# Patient Record
Sex: Male | Born: 1940 | Hispanic: No | Marital: Married | Smoking: Former smoker
Health system: Southern US, Community
[De-identification: ages and names within clinical notes are randomized; demographics above are authoritative.]

## PROBLEM LIST (undated history)

## (undated) DIAGNOSIS — I493 Ventricular premature depolarization: Secondary | ICD-10-CM

## (undated) DIAGNOSIS — L570 Actinic keratosis: Secondary | ICD-10-CM

## (undated) DIAGNOSIS — J342 Deviated nasal septum: Secondary | ICD-10-CM

## (undated) DIAGNOSIS — I4430 Unspecified atrioventricular block: Secondary | ICD-10-CM

## (undated) DIAGNOSIS — I639 Cerebral infarction, unspecified: Secondary | ICD-10-CM

## (undated) DIAGNOSIS — J343 Hypertrophy of nasal turbinates: Secondary | ICD-10-CM

## (undated) DIAGNOSIS — K279 Peptic ulcer, site unspecified, unspecified as acute or chronic, without hemorrhage or perforation: Secondary | ICD-10-CM

## (undated) DIAGNOSIS — I491 Atrial premature depolarization: Secondary | ICD-10-CM

## (undated) DIAGNOSIS — R011 Cardiac murmur, unspecified: Secondary | ICD-10-CM

## (undated) DIAGNOSIS — J301 Allergic rhinitis due to pollen: Secondary | ICD-10-CM

## (undated) DIAGNOSIS — I10 Essential (primary) hypertension: Secondary | ICD-10-CM

## (undated) DIAGNOSIS — G629 Polyneuropathy, unspecified: Secondary | ICD-10-CM

## (undated) DIAGNOSIS — M51379 Other intervertebral disc degeneration, lumbosacral region without mention of lumbar back pain or lower extremity pain: Secondary | ICD-10-CM

## (undated) DIAGNOSIS — Z5189 Encounter for other specified aftercare: Secondary | ICD-10-CM

## (undated) DIAGNOSIS — R51 Headache: Secondary | ICD-10-CM

## (undated) DIAGNOSIS — I998 Other disorder of circulatory system: Secondary | ICD-10-CM

## (undated) DIAGNOSIS — M19039 Primary osteoarthritis, unspecified wrist: Secondary | ICD-10-CM

## (undated) DIAGNOSIS — M1A9XX1 Chronic gout, unspecified, with tophus (tophi): Secondary | ICD-10-CM

## (undated) DIAGNOSIS — I428 Other cardiomyopathies: Secondary | ICD-10-CM

## (undated) DIAGNOSIS — M5137 Other intervertebral disc degeneration, lumbosacral region: Secondary | ICD-10-CM

## (undated) DIAGNOSIS — E119 Type 2 diabetes mellitus without complications: Secondary | ICD-10-CM

## (undated) DIAGNOSIS — R519 Headache, unspecified: Secondary | ICD-10-CM

## (undated) DIAGNOSIS — R799 Abnormal finding of blood chemistry, unspecified: Secondary | ICD-10-CM

## (undated) DIAGNOSIS — H9190 Unspecified hearing loss, unspecified ear: Secondary | ICD-10-CM

## (undated) DIAGNOSIS — R001 Bradycardia, unspecified: Secondary | ICD-10-CM

## (undated) DIAGNOSIS — G4700 Insomnia, unspecified: Secondary | ICD-10-CM

## (undated) DIAGNOSIS — E785 Hyperlipidemia, unspecified: Secondary | ICD-10-CM

## (undated) DIAGNOSIS — F411 Generalized anxiety disorder: Secondary | ICD-10-CM

## (undated) DIAGNOSIS — M1990 Unspecified osteoarthritis, unspecified site: Secondary | ICD-10-CM

## (undated) DIAGNOSIS — K219 Gastro-esophageal reflux disease without esophagitis: Secondary | ICD-10-CM

## (undated) HISTORY — PX: CARDIAC CATHETERIZATION: SHX172

## (undated) HISTORY — DX: Atrial premature depolarization: I49.1

## (undated) HISTORY — DX: Gastro-esophageal reflux disease without esophagitis: K21.9

## (undated) HISTORY — DX: Cardiac murmur, unspecified: R01.1

## (undated) HISTORY — DX: Chronic gout, unspecified, with tophus (tophi): M1A.9XX1

## (undated) HISTORY — DX: Unspecified hearing loss, unspecified ear: H91.90

## (undated) HISTORY — DX: Essential (primary) hypertension: I10

## (undated) HISTORY — DX: Allergic rhinitis due to pollen: J30.1

## (undated) HISTORY — DX: Actinic keratosis: L57.0

## (undated) HISTORY — DX: Generalized anxiety disorder: F41.1

## (undated) HISTORY — DX: Polyneuropathy, unspecified: G62.9

## (undated) HISTORY — DX: Type 2 diabetes mellitus without complications: E11.9

## (undated) HISTORY — DX: Primary osteoarthritis, unspecified wrist: M19.039

## (undated) HISTORY — DX: Deviated nasal septum: J34.2

## (undated) HISTORY — DX: Unspecified atrioventricular block: I44.30

## (undated) HISTORY — DX: Other intervertebral disc degeneration, lumbosacral region without mention of lumbar back pain or lower extremity pain: M51.379

## (undated) HISTORY — DX: Bradycardia, unspecified: R00.1

## (undated) HISTORY — PX: WRIST SURGERY: SHX841

## (undated) HISTORY — PX: ELBOW SURGERY: SHX618

## (undated) HISTORY — DX: Hypertrophy of nasal turbinates: J34.3

## (undated) HISTORY — DX: Encounter for other specified aftercare: Z51.89

## (undated) HISTORY — DX: Headache: R51

## (undated) HISTORY — PX: COLONOSCOPY: SHX174

## (undated) HISTORY — DX: Ventricular premature depolarization: I49.3

## (undated) HISTORY — DX: Headache, unspecified: R51.9

## (undated) HISTORY — DX: Unspecified osteoarthritis, unspecified site: M19.90

## (undated) HISTORY — DX: Peptic ulcer, site unspecified, unspecified as acute or chronic, without hemorrhage or perforation: K27.9

## (undated) HISTORY — DX: Hyperlipidemia, unspecified: E78.5

## (undated) HISTORY — DX: Insomnia, unspecified: G47.00

## (undated) HISTORY — PX: JOINT REPLACEMENT: SHX530

## (undated) HISTORY — DX: Abnormal finding of blood chemistry, unspecified: R79.9

## (undated) HISTORY — DX: Other disorder of circulatory system: I99.8

## (undated) HISTORY — DX: Other intervertebral disc degeneration, lumbosacral region: M51.37

---

## 1998-03-30 ENCOUNTER — Inpatient Hospital Stay (HOSPITAL_COMMUNITY): Admission: EM | Admit: 1998-03-30 | Discharge: 1998-04-05 | Payer: Self-pay | Admitting: Emergency Medicine

## 2013-12-09 ENCOUNTER — Encounter: Payer: Self-pay | Admitting: Gastroenterology

## 2014-01-13 ENCOUNTER — Ambulatory Visit (AMBULATORY_SURGERY_CENTER): Payer: Self-pay

## 2014-01-13 VITALS — Ht 72.0 in | Wt 215.0 lb

## 2014-01-13 DIAGNOSIS — Z83719 Family history of colon polyps, unspecified: Secondary | ICD-10-CM

## 2014-01-13 DIAGNOSIS — Z8371 Family history of colonic polyps: Secondary | ICD-10-CM

## 2014-01-13 MED ORDER — MOVIPREP 100 G PO SOLR: 1.0000 | Freq: Once | ORAL | Status: DC

## 2014-01-27 ENCOUNTER — Other Ambulatory Visit: Payer: Self-pay | Admitting: Gastroenterology

## 2014-01-27 ENCOUNTER — Ambulatory Visit (AMBULATORY_SURGERY_CENTER): Payer: Medicare Other | Admitting: Gastroenterology

## 2014-01-27 ENCOUNTER — Encounter: Payer: Self-pay | Admitting: Gastroenterology

## 2014-01-27 ENCOUNTER — Telehealth: Payer: Self-pay | Admitting: Gastroenterology

## 2014-01-27 VITALS — BP 145/71 | HR 56 | Temp 97.5°F | Resp 18 | Ht 72.0 in | Wt 215.0 lb

## 2014-01-27 DIAGNOSIS — Z8371 Family history of colonic polyps: Secondary | ICD-10-CM

## 2014-01-27 DIAGNOSIS — D126 Benign neoplasm of colon, unspecified: Secondary | ICD-10-CM

## 2014-01-27 MED ORDER — SODIUM CHLORIDE 0.9 % IV SOLN: 500.0000 mL | INTRAVENOUS | Status: DC

## 2014-01-27 NOTE — Progress Notes (Signed)
Report to pacu rn, vss, bbs=clear 

## 2014-01-27 NOTE — Telephone Encounter (Signed)
On call note @ 2150. Pt had colonoscopy today by [facility] with multiple polyps removed. Procedure report reviewed. He noted small amounts BRB every 2 hours since returning home about 2 pm, total of 4 episodes. No pain, weakness, dizziness, fever. He feels well other than noting bleeding. Offered ED evaluation now or monitoring further at home. His wife is with him and he is comfortable watching to determine if the bleeding continues before going to ED. Advised bedrest, clear liquid diet for 24 hours. No ASA or NSAIDs for 3 weeks. Call back or go immediately to [facility] or [facility] ED if bleeding persists or worsens or new symptoms develop. He seems reliable and is comfortable with this plan.

## 2014-01-27 NOTE — Op Note (Signed)
[place]  [address] [place], [id number]   COLONOSCOPY PROCEDURE REPORT  PATIENT: Nicholas Shelton, Nicholas Shelton  MR#: [id number] BIRTHDATE: 1941-07-20 , 72  yrs. old GENDER: Male ENDOSCOPIST: Inda Castle, MD REFERRED VP:XTGG Redmond Pulling, MD PROCEDURE DATE:  01/27/2014 PROCEDURE:   Colonoscopy with snare polypectomy, Colonoscopy with hot biopsy/bipolar, and Submucosal injection, any substance First Screening Colonoscopy - Avg.  risk and is 50 yrs.  old or older - No.  Prior Negative Screening - Now for repeat screening. N/A  History of Adenoma - Now for follow-up colonoscopy & has been > or = to 3 yrs.  Yes hx of adenoma.  Has been 3 or more years since last colonoscopy.  Polyps Removed Today? Yes. ASA CLASS:   Class II INDICATIONS:Patient's personal history of colon polyps.   last colonoscopy greater than 12 years ago MEDICATIONS: propofol (Diprivan) 500mg  IV  DESCRIPTION OF PROCEDURE:   After the risks benefits and alternatives of the procedure were thoroughly explained, informed consent was obtained.  A digital rectal exam revealed no abnormalities of the rectum.   The LB [id number]  endoscope was introduced through the anus and advanced to the cecum, which was identified by both the appendix and ileocecal valve. No adverse events experienced.   The quality of the prep was Suprep good  The instrument was then slowly withdrawn as the colon was fully examined.      COLON FINDINGS: There were 4 sessile polyps in the cecum.  The 2 largest measured 15-20 mm.  One was injected submucosally with 7 cc normal saline.  Dissection was injected with 4 cc normal saline. Each was removed with hot polypectomy snare and submitted to pathology.  A 1 cm and 67mm cecal polyp was each removed with cold polypectomy snare and submitted to pathology.  The remnant of a single polyp in the cecum was removed with the high biopsy forceps.There were 2 sessile polyps measuring approximately  1 cm in the ascending colon which were removed with cold polypectomy snare and submitted to pathology.  Or 2 polyps in the hepatic flexure and proximal transverse colon, respectively, measuring 8 and 15 mm. Each was removed with cold polypectomy snare and submitted to pathology.  In the proximal descending colon 50 mm polyp was removed with cold polypectomy snare and submitted to pathology. There were 2 sessile polyps in the proximal and distal sigmoid, respectively, each measured about 15 mm.  The distal polyp was friable and removed with hot polypectomy snare and submitted to pathology.  The more proximal polyp was removed with cold polypectomy snare and submitted to pathology.   There were 4 sessile polyps in the cecum.  The 2 largest measured 15-20 mm.  One was injected submucosally with 7 cc normal saline.  Dissection was injected with 4 cc normal saline.  Each was removed with hot polypectomy snare and submitted to pathology.  A 1 cm and 92mm cecal polyp was each removed with cold polypectomy snare and submitted to pathology.  The remnant of a single polyp in the cecum was removed with the high biopsy forceps.There were 2 sessile polyps measuring approximately 1 cm in the ascending colon which were removed with cold polypectomy snare and submitted to pathology.  Or 2 polyps in the hepatic flexure and proximal transverse colon, respectively, measuring 8 and 15 mm.  Each was removed with cold polypectomy snare and submitted to pathology.  In the proximal descending colon 50 mm polyp was removed with cold polypectomy snare  and submitted to pathology.  There were 2 sessile polyps in the proximal and distal sigmoid, respectively, each measured about 15 mm.  The distal polyp was friable and removed with hot polypectomy snare and submitted to pathology.  The more proximal polyp was removed with cold polypectomy snare and submitted to pathology.  Retroflexed views revealed no  abnormalities. The time to cecum=4 minutes 0 seconds.  Withdrawal time=36 minutes 42 seconds.  The scope was withdrawn and the procedure completed. COMPLICATIONS: There were no complications.  ENDOSCOPIC IMPRESSION: 1.   colonic polyposis  RECOMMENDATIONS: colonoscopy in one year  eSigned:  Inda Castle, MD 01/27/2014 12:14 PM   cc:   PATIENT NAME:  Nicholas Shelton, Nicholas Shelton MR#: [id number]

## 2014-01-27 NOTE — Progress Notes (Signed)
Called to room to assist during endoscopic procedure.  Patient ID and intended procedure confirmed with present staff. Received instructions for my participation in the procedure from the performing physician.  

## 2014-01-27 NOTE — Patient Instructions (Addendum)

## 2014-01-28 ENCOUNTER — Telehealth: Payer: Self-pay | Admitting: Gastroenterology

## 2014-01-28 NOTE — Telephone Encounter (Signed)
On call note at 1300. I called pt to check on him today. He reports only 1 BM since he called last night. It had scant, light pink liquid with his BM this am. He feels well. No pain, fevers, dizziness. Bleeding appears to be resolving. Advised to advance to a light diet and rest at home this weekend. No ASA/NSAIDs for 3 weeks. Call or present to ED if bleeding recurs.

## 2014-01-30 ENCOUNTER — Telehealth: Payer: Self-pay | Admitting: *Deleted

## 2014-01-30 NOTE — Telephone Encounter (Signed)
  Follow up Call-  Call back number 01/27/2014  Post procedure Call Back phone  # [phone]  Permission to leave phone message Yes     Patient questions:  Do you have a fever, pain , or abdominal swelling? no Pain Score  0 *  Have you tolerated food without any problems? yes  Have you been able to return to your normal activities? yes  Do you have any questions about your discharge instructions: Diet   no Medications  no Follow up visit  no  Do you have questions or concerns about your Care? Yes   Pt. States he had quite a bit of bleeding Friday evening after colonoscopy.  He called and was told to drink lots of fluids and monitor bleeding.  States he's had no bleeding since Early Saturday AM.  Advised to call office if bleeding was heavy and reoccurred, continue liquids.  Actions: * If pain score is 4 or above: No action needed, pain <4.

## 2014-02-06 ENCOUNTER — Encounter: Payer: Self-pay | Admitting: Gastroenterology

## 2014-12-25 ENCOUNTER — Encounter: Payer: Self-pay | Admitting: Gastroenterology

## 2014-12-28 ENCOUNTER — Encounter: Payer: Self-pay | Admitting: Gastroenterology

## 2015-02-15 ENCOUNTER — Ambulatory Visit (AMBULATORY_SURGERY_CENTER): Payer: Self-pay | Admitting: *Deleted

## 2015-02-15 VITALS — Ht 71.0 in | Wt 205.0 lb

## 2015-02-15 DIAGNOSIS — Z86010 Personal history of colonic polyps: Secondary | ICD-10-CM

## 2015-02-15 MED ORDER — NA SULFATE-K SULFATE-MG SULF 17.5-3.13-1.6 GM/177ML PO SOLN: ORAL | Status: DC

## 2015-02-15 NOTE — Progress Notes (Signed)
No egg or soy allergy  No anesthesia or intubation problems per pt  No diet medications taken  Registered in EMMI   

## 2015-02-19 ENCOUNTER — Encounter: Payer: Self-pay | Admitting: Gastroenterology

## 2015-03-01 ENCOUNTER — Ambulatory Visit (AMBULATORY_SURGERY_CENTER): Payer: Medicare Other | Admitting: Gastroenterology

## 2015-03-01 ENCOUNTER — Encounter: Payer: Self-pay | Admitting: Gastroenterology

## 2015-03-01 VITALS — BP 157/95 | HR 52 | Temp 97.1°F | Resp 19 | Ht 71.0 in | Wt 205.0 lb

## 2015-03-01 DIAGNOSIS — D120 Benign neoplasm of cecum: Secondary | ICD-10-CM | POA: Diagnosis not present

## 2015-03-01 DIAGNOSIS — Z86010 Personal history of colonic polyps: Secondary | ICD-10-CM | POA: Diagnosis present

## 2015-03-01 DIAGNOSIS — D124 Benign neoplasm of descending colon: Secondary | ICD-10-CM | POA: Diagnosis not present

## 2015-03-01 DIAGNOSIS — D123 Benign neoplasm of transverse colon: Secondary | ICD-10-CM | POA: Diagnosis not present

## 2015-03-01 DIAGNOSIS — D122 Benign neoplasm of ascending colon: Secondary | ICD-10-CM

## 2015-03-01 MED ORDER — SODIUM CHLORIDE 0.9 % IV SOLN: 500.0000 mL | INTRAVENOUS | Status: DC

## 2015-03-01 NOTE — Progress Notes (Signed)
A/ox3 pleased with MAC, report to Tracy RN 

## 2015-03-01 NOTE — Op Note (Signed)
[place]  [address]. [place], [id number]   COLONOSCOPY PROCEDURE REPORT  PATIENT: Nicholas Shelton, Nicholas Shelton  MR#: [id number] BIRTHDATE: [birth year] , 89  yrs. old GENDER: male ENDOSCOPIST: Inda Castle, MD REFERRED BY: PROCEDURE DATE:  03/01/2015 PROCEDURE:   Colonoscopy, surveillance , Colonoscopy with snare polypectomy, and Colonoscopy with cold biopsy polypectomy First Screening Colonoscopy - Avg.  risk and is 50 yrs.  old or older - No.  Prior Negative Screening - Now for repeat screening. N/A  History of Adenoma - Now for follow-up colonoscopy & has been > or = to 3 yrs.  No.  It has been less than 3 yrs since last colonoscopy.  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 MEDICATIONS: Monitored anesthesia care and Propofol 250 mg IV  DESCRIPTION OF PROCEDURE:   After the risks benefits and alternatives of the procedure were thoroughly explained, informed consent was obtained.  The digital rectal exam revealed no abnormalities of the rectum.   The LB [id number]  endoscope was introduced through the anus and advanced to the cecum, which was identified by both the appendix and ileocecal valve. No adverse events experienced.   The quality of the prep was (Suprep was used) good.  The instrument was then slowly withdrawn as the colon was fully examined.      COLON FINDINGS: A sessile polyp measuring 8 mm in size was found at the cecum.  A polypectomy was performed with a cold snare.  The resection was complete, the polyp tissue was completely retrieved and sent to histology.   A sessile polyp measuring 15 mm in size was found in the ascending colon.  A polypectomy was performed with a cold snare.  The resection was complete, the polyp tissue was completely retrieved and sent to histology.   A sessile polyp measuring 4 mm in size was found at the hepatic flexure.  A polypectomy was performed with a cold  snare.  The resection was complete, the polyp tissue was completely retrieved and sent to histology.   A semi-pedunculated polyp measuring 9 mm in size was found in the descending colon.  A polypectomy was performed with a cold snare.  The resection was complete, the polyp tissue was completely retrieved and sent to histology.  Retroflexed views revealed no abnormalities. The time to cecum = 6.8 Withdrawal time = 9.8   The scope was withdrawn and the procedure completed. COMPLICATIONS: There were no immediate complications.  ENDOSCOPIC IMPRESSION: 1.   Sessile polyp was found at the cecum; polypectomy was performed with a cold snare 2.   Sessile polyp was found in the ascending colon; polypectomy was performed with a cold snare 3.   Sessile polyp was found at the hepatic flexure; polypectomy was performed with a cold snare 4.   Semi-pedunculated polyp was found in the descending colon; polypectomy was performed with a cold snare 5.   Sessile polyp was found in the ascending colon; polypectomy was performed with cold forceps  RECOMMENDATIONS: Colonoscopy 3 years  eSigned:  Inda Castle, MD 03/01/2015 10:15 AM   cc:   PATIENT NAME:  Nicholas Shelton, Nicholas Shelton MR#: [id number]

## 2015-03-01 NOTE — Patient Instructions (Signed)
Impressions/recommendations:  Polyps (handout given)  Repeat colonoscopy in 3 years.  No aspirin, aspirin containing products or NSAIDS for 14 days. May resume May 6 if needed. Tylenol only until then if needed.  YOU HAD AN ENDOSCOPIC PROCEDURE TODAY AT [facility]:   Refer to the procedure report that was given to you for any specific questions about what was found during the examination.  If the procedure report does not answer your questions, please call your gastroenterologist to clarify.  If you requested that your care partner not be given the details of your procedure findings, then the procedure report has been included in a sealed envelope for you to review at your convenience later.  YOU SHOULD EXPECT: Some feelings of bloating in the abdomen. Passage of more gas than usual.  Walking can help get rid of the air that was put into your GI tract during the procedure and reduce the bloating. If you had a lower endoscopy (such as a colonoscopy or flexible sigmoidoscopy) you may notice spotting of blood in your stool or on the toilet paper. If you underwent a bowel prep for your procedure, you may not have a normal bowel movement for a few days.  Please Note:  You might notice some irritation and congestion in your nose or some drainage.  This is from the oxygen used during your procedure.  There is no need for concern and it should clear up in a day or so.  SYMPTOMS TO REPORT IMMEDIATELY:   Following lower endoscopy (colonoscopy or flexible sigmoidoscopy):  Excessive amounts of blood in the stool  Significant tenderness or worsening of abdominal pains  Swelling of the abdomen that is new, acute  Fever of 100F or higher   For urgent or emergent issues, a gastroenterologist can be reached at any hour by calling [phone].   DIET: Your first meal following the procedure should be a small meal and then it is ok to progress to your normal diet. Heavy or fried foods  are harder to digest and may make you feel nauseous or bloated.  Likewise, meals heavy in dairy and vegetables can increase bloating.  Drink plenty of fluids but you should avoid alcoholic beverages for 24 hours.  ACTIVITY:  You should plan to take it easy for the rest of today and you should NOT DRIVE or use heavy machinery until tomorrow (because of the sedation medicines used during the test).    FOLLOW UP: Our staff will call the number listed on your records the next business day following your procedure to check on you and address any questions or concerns that you may have regarding the information given to you following your procedure. If we do not reach you, we will leave a message.  However, if you are feeling well and you are not experiencing any problems, there is no need to return our call.  We will assume that you have returned to your regular daily activities without incident.  If any biopsies were taken you will be contacted by phone or by letter within the next 1-3 weeks.  Please call us at [phone] if you have not heard about the biopsies in 3 weeks.    SIGNATURES/CONFIDENTIALITY: You and/or your care partner have signed paperwork which will be entered into your electronic medical record.  These signatures attest to the fact that that the information above on your After Visit Summary has been reviewed and is understood.  Full responsibility of the confidentiality  of this discharge information lies with you and/or your care-partner.

## 2015-03-01 NOTE — Progress Notes (Signed)
Dr. Deatra Ina asked for instructions regarding aspirin, aspirin containing products and NSAIDS to be stopped for 14 days to be added to discharge instructions verbally.

## 2015-03-01 NOTE — Progress Notes (Signed)
Called to room to assist during endoscopic procedure.  Patient ID and intended procedure confirmed with present staff. Received instructions for my participation in the procedure from the performing physician.  

## 2015-03-02 ENCOUNTER — Telehealth: Payer: Self-pay | Admitting: *Deleted

## 2015-03-02 NOTE — Telephone Encounter (Signed)
  Follow up Call-  Call back number 03/01/2015 01/27/2014  Post procedure Call Back phone  # [phone]  Permission to leave phone message Yes Yes     Patient questions:  Do you have a fever, pain , or abdominal swelling? No. Pain Score  0 *  Have you tolerated food without any problems? Yes.    Have you been able to return to your normal activities? Yes.    Do you have any questions about your discharge instructions: Diet   No. Medications  No. Follow up visit  No.  Do you have questions or concerns about your Care? No.  Actions: * If pain score is 4 or above: No action needed, pain <4.

## 2015-03-09 ENCOUNTER — Encounter: Payer: Self-pay | Admitting: Gastroenterology

## 2017-11-06 ENCOUNTER — Encounter: Payer: Self-pay | Admitting: Internal Medicine

## 2017-11-06 ENCOUNTER — Ambulatory Visit: Payer: Medicare Other | Admitting: Internal Medicine

## 2017-11-06 VITALS — BP 188/82 | HR 58 | Ht 71.0 in | Wt 204.0 lb

## 2017-11-06 DIAGNOSIS — I4430 Unspecified atrioventricular block: Secondary | ICD-10-CM | POA: Diagnosis not present

## 2017-11-06 DIAGNOSIS — I491 Atrial premature depolarization: Secondary | ICD-10-CM

## 2017-11-06 DIAGNOSIS — I493 Ventricular premature depolarization: Secondary | ICD-10-CM

## 2017-11-06 MED ORDER — ATORVASTATIN CALCIUM 20 MG PO TABS: 20.0000 mg | ORAL_TABLET | ORAL | 10 refills | Status: DC

## 2017-11-06 MED ORDER — CARVEDILOL 3.125 MG PO TABS: 3.1250 mg | ORAL_TABLET | Freq: Two times a day (BID) | ORAL | 11 refills | Status: DC

## 2017-11-06 NOTE — Progress Notes (Signed)
ELECTROPHYSIOLOGY CONSULT NOTE  Patient ID: Nicholas Shelton, MRN: [id number], DOB/AGE: October 17, 1941 76 y.o. Admit date: (Not on file) Date of Consult: 11/06/2017  Primary Physician: Christain Sacramento, MD Primary Cardiologist: Erik Obey [name] is a 76 y.o. male who is being seen today for the evaluation of Bradycardia at the request of Dr Redmond Pulling.    HPI Nicholas Shelton is a 76 y.o. male  Referred because of bradycardia and abnormal heartbeats.  His ECG was noted to show T wave inversions and he has also noted to have murmur  He notes no impairment in exercise tolerance.  He denies shortness of breath with exertion or at rest.  He does have episodic chest pains if he does heavy chest port.  However, typically last 2-3 days at a time.  Family history is negative.  He does not smoke.  He has been told that he has had skips his heart for many years.  He is unaware of them.  He has not had syncope or presyncope.  He has hypertension and diabetes.  He is on statin therapy which is been causing leg cramps.      Past Medical History:  Diagnosis Date  . Abnormal blood chemistry test   . Actinic keratosis   . Arthritis   . AV block   . Blood transfusion without reported diagnosis   . Bradycardia   . Degeneration of lumbar or lumbosacral intervertebral disc   . Deviated nasal septum   . Diabetes mellitus without complication ([place])   . Gastroesophageal reflux disease without esophagitis   . Generalized anxiety disorder   . Gout with tophus   . Headache   . Hearing loss   . Hyperlipidemia   . Hypertension   . Insomnia   . Ischemia   . Murmur   . Nasal turbinate hypertrophy   . Osteoarthritis of wrist   . PAC (premature atrial contraction)   . Peptic ulcer   . Peripheral neuropathy   . PVC (premature ventricular contraction)   . Seasonal allergic rhinitis due to pollen       Surgical History:  Past Surgical History:  Procedure Laterality Date  . COLONOSCOPY    . ELBOW  SURGERY     pinched nerve on both arms  . WRIST SURGERY     pinched nerve     Home Meds: Prior to Admission medications   Medication Sig Start Date End Date Taking? Authorizing Provider  allopurinol (ZYLOPRIM) 300 MG tablet Take 300 mg by mouth daily.   Yes [provider]  amLODipine (NORVASC) 10 MG tablet Take 10 mg by mouth daily.   Yes [provider]  atorvastatin (LIPITOR) 10 MG tablet Take 10 mg by mouth daily.   Yes [provider]  carvedilol (COREG) 12.5 MG tablet  02/26/15  Yes [provider]  cloNIDine (CATAPRES) 0.3 MG tablet Take 0.3 mg by mouth 3 (three) times daily.    Yes [provider]  hydrochlorothiazide (HYDRODIURIL) 25 MG tablet Take 25 mg by mouth daily.   Yes [provider]  lisinopril (PRINIVIL,ZESTRIL) 40 MG tablet Take 40 mg by mouth daily.   Yes [provider]  metFORMIN (GLUCOPHAGE) 1000 MG tablet Take 1,000 mg by mouth 2 (two) times daily with a meal.   Yes [provider]  pantoprazole (PROTONIX) 40 MG tablet Take 40 mg by mouth daily.   Yes [provider]  traZODone (DESYREL) 50 MG tablet 50  mg. TAKE 1/2 OR 1 TABLET BY MOUTH AT BEDTIME   Yes [provider]    Allergies: No Known Allergies  Social History   Socioeconomic History  . Marital status: Married    Spouse name: Not on file  . Number of children: Not on file  . Years of education: Not on file  . Highest education level: Not on file  Social Needs  . Financial resource strain: Not on file  . Food insecurity - worry: Not on file  . Food insecurity - inability: Not on file  . Transportation needs - medical: Not on file  . Transportation needs - non-medical: Not on file  Occupational History  . Not on file  Tobacco Use  . Smoking status: Former Research scientist (life sciences)  . Smokeless tobacco: Never Used  Substance and Sexual Activity  . Alcohol use: No  . Drug use: No  . Sexual activity: Not on file    Comment:  married  Other Topics Concern  . Not on file  Social History Narrative  . Not on file     Family History  Problem Relation Age of Onset  . Colon polyps Brother   . Hypertension Brother   . Hypertension Sister   . Colon cancer Neg Hx   . Esophageal cancer Neg Hx   . Rectal cancer Neg Hx   . Stomach cancer Neg Hx      ROS:  Please see the history of present illness.     All other systems reviewed and negative.    Physical Exam: Blood pressure (!) 188/82, pulse (!) 58, height 5\' 11"  (1.803 m), weight 204 lb (92.5 kg), SpO2 98 %. General: Well developed, well nourished male in no acute distress. Head: Normocephalic, atraumatic, sclera non-icteric, no xanthomas, nares are without discharge. EENT: normal  Lymph Nodes:  none Neck: Bilateral radiating murmurs, doubt carotid bruits. JVD 8 Back:without scoliosis kyphosis  Lungs: Clear bilaterally to auscultation without wheezes, rales, or rhonchi. Breathing is unlabored. Heart: RRR with S1 S2.  2/6 systolic murmur varies with RR radiates to neck B . No rubs, or gallops appreciated. Abdomen: Soft, non-tender, non-distended with normoactive bowel sounds. No hepatomegaly. No rebound/guarding. No obvious abdominal masses. Msk:  Strength and tone appear normal for age. Extremities: No clubbing or cyanosis.  1+ edema.  Distal pedal pulses are 2+ and equal bilaterally. Skin: Warm and Dry Neuro: Alert and oriented X 3. CN III-XII intact Grossly normal sensory and motor function . Psych:  Responds to questions appropriately with a normal affect.      Labs: Cardiac Enzymes No results for input(s): CKTOTAL, CKMB, TROPONINI in the last 72 hours. CBC No results found for: WBC, HGB, HCT, MCV, PLT PROTIME: No results for input(s): LABPROT, INR in the last 72 hours. Chemistry No results for input(s): NA, K, CL, CO2, BUN, CREATININE, CALCIUM, PROT, BILITOT, ALKPHOS, ALT, AST, GLUCOSE in the last 168 hours.  Invalid input(s): LABALBU Lipids No  results found for: CHOL, HDL, LDLCALC, TRIG BNP No results found for: PROBNP Thyroid Function Tests: No results for input(s): TSH, T4TOTAL, T3FREE, THYROIDAB in the last 72 hours.  Invalid input(s): FREET3 Miscellaneous No results found for: DDIMER  Radiology/Studies:  No results found.  EKG: sinus @ 58 21/12/41 PVC LBBB indeterminate axis freq   Assessment and Plan:  Sinus bradycardia  PVCs  PACs  Hypertension  LEG CRAMPS  Aortic stenosis/sclerosis  HFpEF   The patient has sinus bradycardia that is not clearly symptomatic.  Hence, aggressive  therapy is not indicated.  He is also on carvedilol which may be suppressing his sinus rate.  We will back off from 12.5--3.2125 twice daily.  This may further have an impact on suppressing ventricular ectopy.  His PVC burden is not trivial.  He is asymptomatic and so the indication for therapy would be impact on left ventricular function.  We will obtain an echocardiogram.  Echocardiogram will also allow [place] to assess his aortic valve and the degree of his aortic stenosis.  His blood pressure remains elevated.  However, at home he says at about 118 and he checks it twice daily.  He did not take his medicines this morning.  Hopefully, as we decrease his carvedilol, his blood pressure will stay under adequate control.  With leg cramps, I suggest that he take his statin twice a week and see how it does increase in the dose from 10--20 mg at a time  We do not have access to metabolic profile but I anticipate that they have been checked and are normal.  Office records that were sent were reviewed.     Virl Axe

## 2017-11-06 NOTE — Patient Instructions (Addendum)
Medication Instructions: Your physician has recommended you make the following change in your medication:  -1) DECREASE Carvedilol (coreg) 3.125 mg - Take 1 tablet (3.125 mg) by mouth twice daily -2) CHANGE Atorvastatin (lipitor) 20 mg - Take 1 tablet by mouth twice a week  Labwork: None Ordered  Procedures/Testing: Your physician has requested that you have an echocardiogram. Echocardiography is a painless test that uses sound waves to create images of your heart. It provides your doctor with information about the size and shape of your heart and how well your heart's chambers and valves are working. This procedure takes approximately one hour. There are no restrictions for this procedure.   Follow-Up: Your physician recommends that you schedule a follow-up appointment based on your test results  If you need a refill on your cardiac medications before your next appointment, please call your pharmacy.

## 2017-11-16 ENCOUNTER — Other Ambulatory Visit: Payer: Self-pay

## 2017-11-16 ENCOUNTER — Ambulatory Visit (HOSPITAL_COMMUNITY): Payer: Medicare Other | Attending: Cardiology

## 2017-11-16 DIAGNOSIS — I350 Nonrheumatic aortic (valve) stenosis: Secondary | ICD-10-CM | POA: Insufficient documentation

## 2017-11-16 DIAGNOSIS — E119 Type 2 diabetes mellitus without complications: Secondary | ICD-10-CM | POA: Diagnosis not present

## 2017-11-16 DIAGNOSIS — I10 Essential (primary) hypertension: Secondary | ICD-10-CM | POA: Insufficient documentation

## 2017-11-16 DIAGNOSIS — I493 Ventricular premature depolarization: Secondary | ICD-10-CM | POA: Insufficient documentation

## 2017-11-16 DIAGNOSIS — I491 Atrial premature depolarization: Secondary | ICD-10-CM | POA: Diagnosis not present

## 2018-02-16 ENCOUNTER — Encounter: Payer: Self-pay | Admitting: Gastroenterology

## 2018-10-24 ENCOUNTER — Encounter (HOSPITAL_COMMUNITY): Payer: Self-pay | Admitting: Emergency Medicine

## 2018-10-24 ENCOUNTER — Emergency Department (HOSPITAL_COMMUNITY): Payer: Medicare Other

## 2018-10-24 ENCOUNTER — Emergency Department (HOSPITAL_COMMUNITY)
Admission: EM | Admit: 2018-10-24 | Discharge: 2018-10-24 | Disposition: A | Discharge status: Home / Self Care | Payer: Medicare Other | Attending: Emergency Medicine | Admitting: Emergency Medicine

## 2018-10-24 DIAGNOSIS — Z79899 Other long term (current) drug therapy: Secondary | ICD-10-CM | POA: Diagnosis not present

## 2018-10-24 DIAGNOSIS — R1084 Generalized abdominal pain: Secondary | ICD-10-CM | POA: Diagnosis not present

## 2018-10-24 DIAGNOSIS — Z87891 Personal history of nicotine dependence: Secondary | ICD-10-CM | POA: Diagnosis not present

## 2018-10-24 DIAGNOSIS — E785 Hyperlipidemia, unspecified: Secondary | ICD-10-CM | POA: Diagnosis not present

## 2018-10-24 DIAGNOSIS — R911 Solitary pulmonary nodule: Secondary | ICD-10-CM | POA: Diagnosis not present

## 2018-10-24 DIAGNOSIS — I10 Essential (primary) hypertension: Secondary | ICD-10-CM | POA: Diagnosis present

## 2018-10-24 DIAGNOSIS — E119 Type 2 diabetes mellitus without complications: Secondary | ICD-10-CM | POA: Diagnosis not present

## 2018-10-24 DIAGNOSIS — Z7984 Long term (current) use of oral hypoglycemic drugs: Secondary | ICD-10-CM | POA: Diagnosis not present

## 2018-10-24 LAB — CBC WITH DIFFERENTIAL/PLATELET
Abs Immature Granulocytes: 0.02 10*3/uL (ref 0.00–0.07)
Basophils Absolute: 0 10*3/uL (ref 0.0–0.1)
Basophils Relative: 1 %
EOS ABS: 0.1 10*3/uL (ref 0.0–0.5)
Eosinophils Relative: 1 %
HEMATOCRIT: 34.4 % — AB (ref 39.0–52.0)
Hemoglobin: 11.6 g/dL — ABNORMAL LOW (ref 13.0–17.0)
Immature Granulocytes: 0 %
Lymphocytes Relative: 22 %
Lymphs Abs: 1.3 10*3/uL (ref 0.7–4.0)
MCH: 29.2 pg (ref 26.0–34.0)
MCHC: 33.7 g/dL (ref 30.0–36.0)
MCV: 86.6 fL (ref 80.0–100.0)
MONO ABS: 0.4 10*3/uL (ref 0.1–1.0)
Monocytes Relative: 7 %
Neutro Abs: 4.2 10*3/uL (ref 1.7–7.7)
Neutrophils Relative %: 69 %
Platelets: 163 10*3/uL (ref 150–400)
RBC: 3.97 MIL/uL — ABNORMAL LOW (ref 4.22–5.81)
RDW: 12.6 % (ref 11.5–15.5)
WBC: 6 10*3/uL (ref 4.0–10.5)
nRBC: 0 % (ref 0.0–0.2)

## 2018-10-24 LAB — COMPREHENSIVE METABOLIC PANEL
ALT: 18 U/L (ref 0–44)
AST: 21 U/L (ref 15–41)
Albumin: 4.1 g/dL (ref 3.5–5.0)
Alkaline Phosphatase: 59 U/L (ref 38–126)
Anion gap: 13 (ref 5–15)
BUN: 21 mg/dL (ref 8–23)
CO2: 24 mmol/L (ref 22–32)
Calcium: 9.9 mg/dL (ref 8.9–10.3)
Chloride: 99 mmol/L (ref 98–111)
Creatinine, Ser: 0.96 mg/dL (ref 0.61–1.24)
GFR calc Af Amer: >60 mL/min (ref >60)
GFR calc non Af Amer: >60 mL/min (ref >60)
Glucose, Bld: 155 mg/dL — ABNORMAL HIGH (ref 70–99)
Potassium: 3.8 mmol/L (ref 3.5–5.1)
Sodium: 136 mmol/L (ref 135–145)
TOTAL PROTEIN: 7.2 g/dL (ref 6.5–8.1)
Total Bilirubin: 0.5 mg/dL (ref 0.3–1.2)

## 2018-10-24 LAB — I-STAT CG4 LACTIC ACID, ED
Lactic Acid, Venous: 2.08 mmol/L (ref 0.5–1.9)
Lactic Acid, Venous: 1.68 mmol/L (ref 0.5–1.9)

## 2018-10-24 LAB — I-STAT TROPONIN, ED: Troponin i, poc: 0.02 ng/mL (ref 0.00–0.08)

## 2018-10-24 LAB — LIPASE, BLOOD: Lipase: 28 U/L (ref 11–51)

## 2018-10-24 IMAGING — CT CT ANGIO CHEST-ABD-PELV FOR DISSECTION W/ AND WO/W CM
2 of 7 series · 9 of 46 positions shown, 10 images · IV contrast (APPLIED)
Comparison: None.

CLINICAL DATA: Hypertension. Abdominal and back pain for the past 4
weeks. Evaluate for abdominal aortic dissection

EXAM:
CT ANGIOGRAPHY CHEST, ABDOMEN AND PELVIS
TECHNIQUE: Multidetector CT imaging through the chest, abdomen and pelvis was
performed using the standard protocol during bolus administration of
intravenous contrast. Multiplanar reconstructed images and MIPs were
obtained and reviewed to evaluate the vascular anatomy.
CONTRAST:  100mL [9P] IOPAMIDOL ([9P]) INJECTION 76%

[Series 6: arterial · axial · arterial · 0.80mm/px · z∈[-540,-16]mm · 6 of 338 slices shown, 7 images]
[im 38/338  soft-tissue]
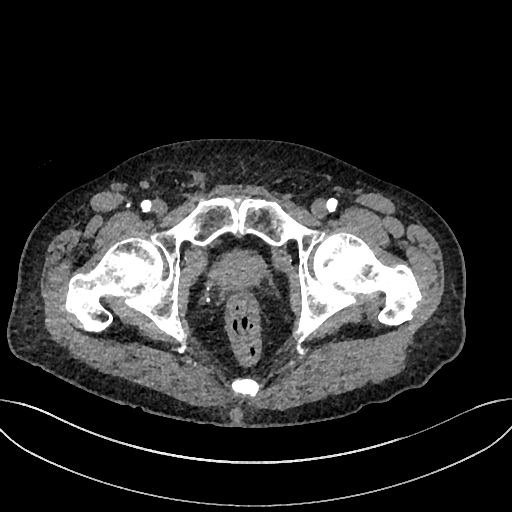
[im 38/338  bone]
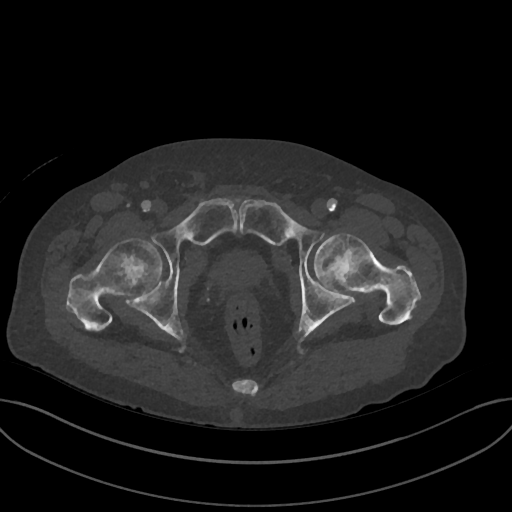
[im 94/338  soft-tissue]
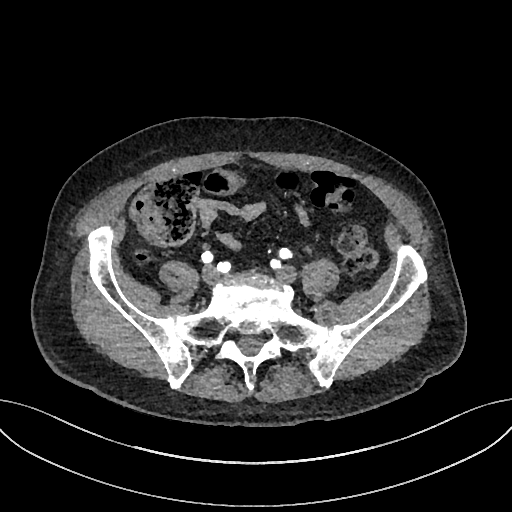
[im 150/338  soft-tissue]
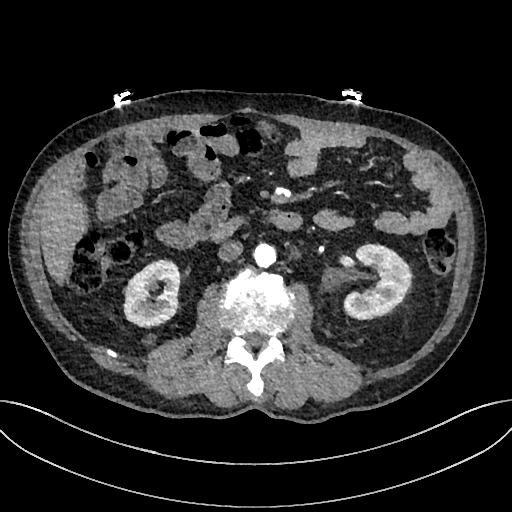
[im 188/338  soft-tissue]
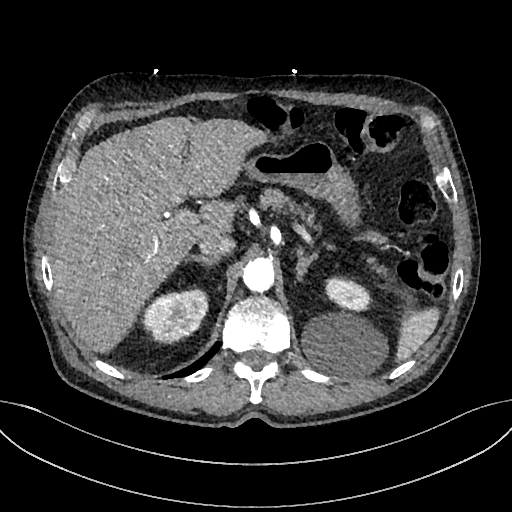
[im 244/338  soft-tissue]
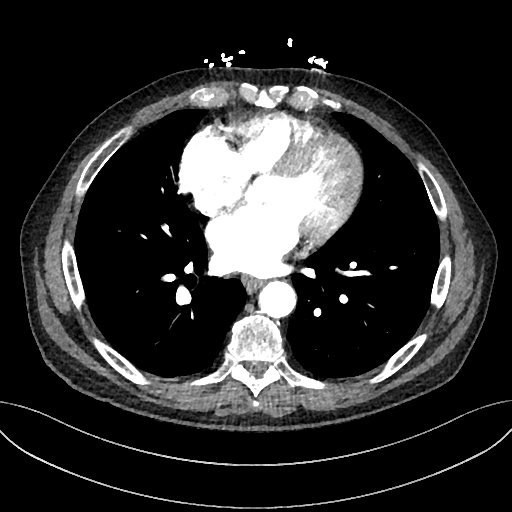
[im 300/338  soft-tissue]
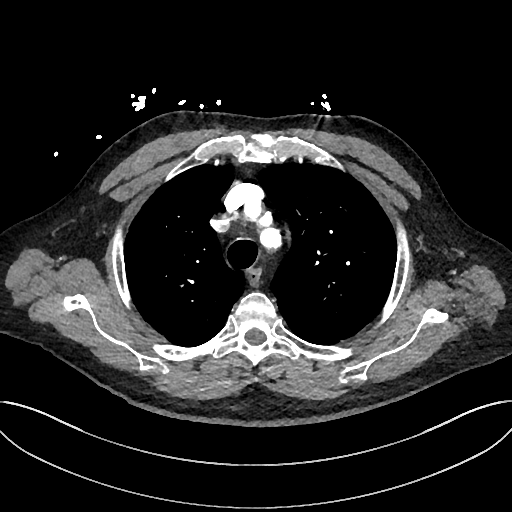

[Series 9: cor · coronal · 0.75mm/px · 3 of 149 slices shown]
[im 38/149  soft-tissue]
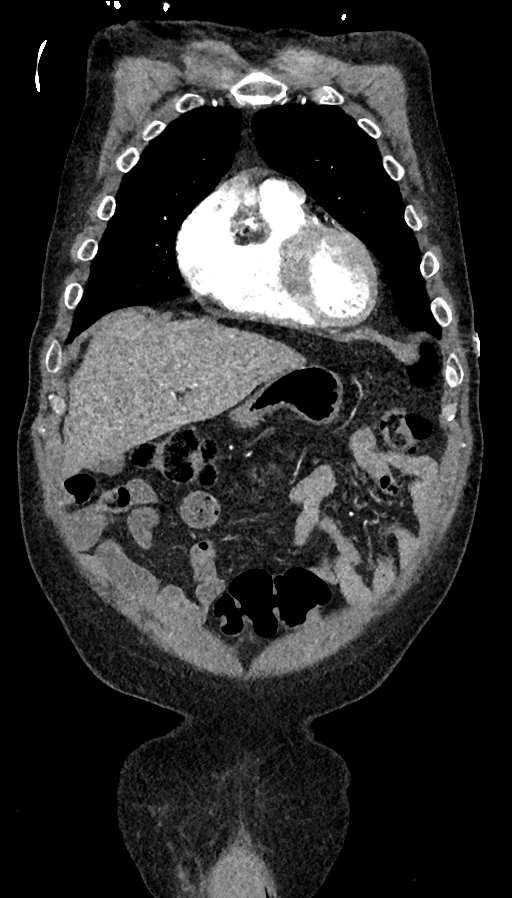
[im 75/149  soft-tissue]
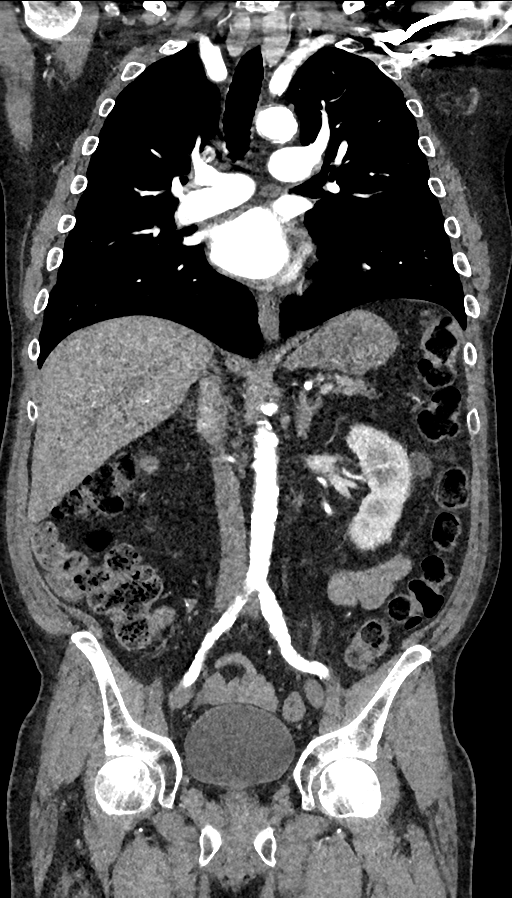
[im 112/149  soft-tissue]
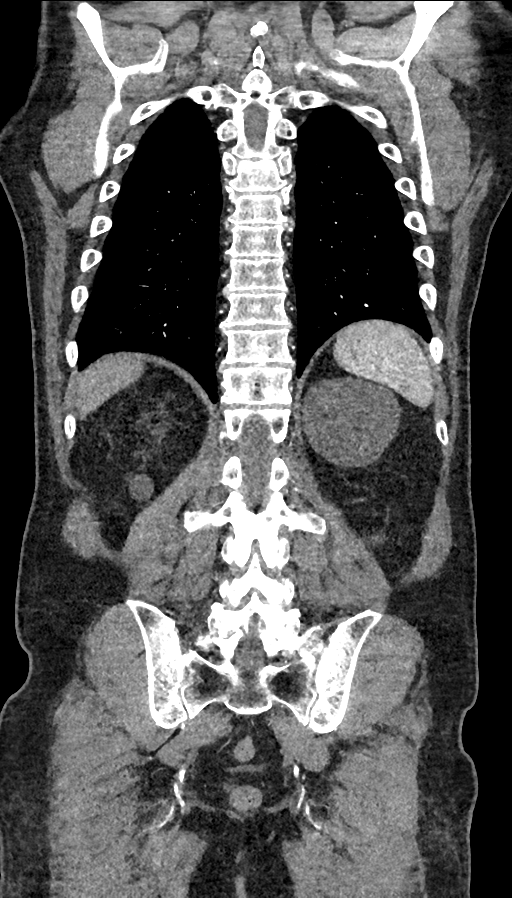

[9 of 46 positions shown; findings below may reference images not displayed]

FINDINGS: CTA CHEST FINDINGS

Vascular Findings:

No evidence of thoracic aortic aneurysm or dissection on this
nongated examination. Review of the precontrast images is negative
for the presence of an intramural hematoma.

There is a large amount of slightly irregular mixed calcified and
noncalcified atherosclerotic plaque throughout the thoracic aorta,
not resulting in hemodynamically significant stenosis.

Conventional configuration of the aortic arch. The branch vessels of
the aortic arch appear widely patent throughout their imaged
courses.

Borderline cardiomegaly. No pericardial effusion. Coronary artery
calcifications.

Although this examination was not tailored for the evaluation the
pulmonary arteries, there are no discrete filling defects within the
central pulmonary arterial tree to suggest central pulmonary
embolism. Normal caliber of the main pulmonary artery.

-------------------------------------------------------------

Thoracic aortic measurements:

Sinotubular junction

32 mm as measured in greatest oblique coronal dimension.

Proximal ascending aorta

37 mm as measured in greatest oblique axial dimension at the level
of the main pulmonary artery (axial image 68, series 6) and
approximately 36 mm in greatest oblique short axis coronal diameter
(coronal image 51, series 9)

Aortic arch aorta

29 mm as measured in greatest oblique sagittal dimension.

Proximal descending thoracic aorta

29 mm as measured in greatest oblique axial dimension at the level
of the main pulmonary artery.

Distal descending thoracic aorta

29 mm as measured in greatest oblique axial dimension at the level
of the diaphragmatic hiatus.

Review of the MIP images confirms the above findings.

-------------------------------------------------------------

Non-Vascular Findings:

Mediastinum/Lymph Nodes: Subcarinal lymph node is prominent though
not enlarged by size criteria measuring 0.8 cm in greatest short
axis diameter, presumably reactive. No bulky mediastinal, hilar
axillary lymphadenopathy.

Lungs/Pleura: Minimal dependent subpleural ground-glass atelectasis.
No discrete focal airspace opacities. No pleural effusion or
pneumothorax. The central pulmonary airways appear widely patent.

Punctate (sub 4 mm) left upper lobe pulmonary nodules (images 38 and
66, series 7).

Musculoskeletal: No acute or aggressive osseous abnormalities.
Stigmata of DISH within the thoracic spine. Regional soft tissues
appear normal. The thyroid gland appears mildly enlarged though
without discrete nodule.

_________________________________________________________

CTA ABDOMEN AND PELVIS FINDINGS

VASCULAR

Aorta: Moderate to large amount of slightly irregular mixed
calcified and noncalcified atherosclerotic plaque throughout the
abdominal aorta, not resulting in hemodynamically significant
stenosis. No evidence of abdominal aortic dissection or periaortic
stranding.

Celiac: There is a minimal amount of mixed calcified and
noncalcified atherosclerotic plaque involving the origin of the
celiac artery, not resulting in a hemodynamically significant
stenosis. The left gastric artery is incidentally noted to give rise
to an accessory left hepatic artery. Otherwise, conventional
branching pattern.

SMA: There is a minimal amount of mixed calcified and noncalcified
atherosclerotic plaque involving the origin and main trunk of the
SMA, not resulting in hemodynamically significant stenosis.
Conventional branching pattern. The distal tributaries the SMA
appear widely patent without discrete intraluminal filling defect
suggest distal embolism.

Renals: Duplicated, nearly co-dominant right-sided renal arteries.
Moderate amount of mixed calcified and noncalcified atherosclerotic
plaque involves the origin and proximal aspect of the solitary left
renal artery, not definitely resulting in a hemodynamically
significant stenosis. No discrete areas of vessel irregularity to
suggest FMD.

IMA: Remains widely patent.

Inflow: There is a moderate amount of eccentric calcified
atherosclerotic plaque involving the origin of the left common iliac
artery (image 221, series 6), not definitely resulting in a
hemodynamically significant stenosis. The right common and bilateral
external iliac arteries are diseased though without a definitive
hemodynamically significant stenosis. The bilateral internal iliac
arteries are patent and of normal caliber.

There is a moderate amount of eccentric mixed calcified and
noncalcified atherosclerotic plaque within the right common femoral
artery, not definitely resulting in a hemodynamically significant
stenosis.

Focal eccentric calcified atherosclerotic plaque within the left
common femoral artery approaches 50% luminal narrowing (image 290,
series 6). Eccentric mixed calcified and noncalcified
atherosclerotic plaque involving the imaged proximal aspects of the
bilateral superficial femoral arteries approaches 50% luminal
narrowing bilaterally (right - image 308, series 6; left - image
311, series 6).

Veins: The IVC and pelvic venous system appear patent on this
arterial phase examination.

Review of the MIP images confirms the above findings.

_________________________________________________________

NON-VASCULAR

Evaluation of the abdominal organs is limited to the arterial phase
of enhancement.

Hepatobiliary: There is mild nodularity of the hepatic contour as
could be seen in the setting of early cirrhotic change. No discrete
hyperenhancing hepatic lesions. Normal appearance of the gallbladder
given degree of distention. No radiopaque gallstones. No intra or
extrahepatic biliary ductal dilatation. No ascites.

Pancreas: Normal appearance of the pancreas.

Spleen: Normal appearance of the spleen.

Adrenals/Urinary Tract: There is symmetric enhancement of the
bilateral kidneys. Note is made of bilateral renal cysts including
dominant exophytic cyst arising from the superior pole of the left
kidney measuring 7.6 x 5.6 x 6.9 cm and dominant partially exophytic
cyst arising from the posterior inferior aspect of the left kidney
measuring approximately 1.9 cm.

There are 3 additional indeterminate right-sided renal lesions which
can not be characterized as simple renal cysts with dominant
partially exophytic cyst arising from the anterior interpolar aspect
of the right kidney measuring 1.7 cm in diameter (image [9P], series
6). Additional indeterminate right-sided renal lesions are seen on
axial image 175 and coronal image 90, series 9.

Stomach/Bowel: Large colonic stool burden without evidence of
enteric obstruction. The bowel is normal in course and caliber
without discrete area of wall thickening. No pneumoperitoneum,
pneumatosis or portal venous gas.

Lymphatic: Scattered retroperitoneal porta hepatis lymph nodes are
numerous though individually not enlarged by size criteria. Index
pre caval lymph node measures approximately 0.7 cm in greatest short
axis diameter and contains internal dystrophic calcifications,
likely the sequela of prior granulomatous infection (image 161,
series 6). No bulky retroperitoneal, mesenteric, pelvic or inguinal
lymphadenopathy.

Reproductive: Normal appearance the prostate gland. No free fluid
the pelvic cul-de-sac.

Other: Diffuse body wall anasarca, most conspicuous about the
midline low back. Tiny mesenteric fat containing periumbilical
hernia. Small bilateral mesenteric fat containing indirect inguinal
hernias, right greater than left.

Musculoskeletal: No acute or aggressive osseous abnormalities.
Mild-to-moderate multilevel lumbar spine DDD, worse at L5-S1 with
disc space height loss, endplate irregularity and posteriorly
directed disc osteophyte complex at this location.

Review of the MIP images confirms the above findings.
IMPRESSION: Chest Impression:

1. No acute cardiopulmonary disease. Specifically, no evidence of
thoracic aortic aneurysm or dissection on this nongated examination.
No evidence of intramural hematoma.
2. Coronary calcifications.  Aortic Atherosclerosis ([9P]-[9P]).
3. Punctate (sub 4 mm) left upper lobe pulmonary nodules. No
follow-up needed if patient is low-risk (and has no known or
suspected primary neoplasm). Non-contrast chest CT can be considered
in 12 months if patient is high-risk. This recommendation follows
the consensus statement: Guidelines for Management of Incidental
Pulmonary Nodules Detected on CT Images: From the [HOSPITAL]

Abdomen and Pelvis Impression:

1. No explanation for patient's abdominal and back pain.
Specifically, no evidence of abdominal aortic aneurysm.
2. Aortic Atherosclerosis ([9P]-[9P]).
3. Suspected hemodynamically significant narrowings involving the
left common femoral artery as well as the imaged proximal aspects of
the bilateral superficial femoral arteries. Correlation for symptoms
of PAD is advised.
4. Mild to moderate multilevel lumbar spine DDD, worse at L5-S1.
5. **An incidental finding of potential clinical significance has
been found. Indeterminate right-sided renal lesions, potentially
representative of minimally complex renal cyst though incompletely
characterized on this examination. Further evaluation with dedicated
nonemergent abdominal MRI could be performed as indicated. **
6.

## 2018-10-24 MED ORDER — IOPAMIDOL (ISOVUE-370) INJECTION 76%
INTRAVENOUS | Status: AC
  Administered 2018-10-24: 100 mL
  Filled 2018-10-24: qty 100

## 2018-10-24 MED ORDER — SODIUM CHLORIDE 0.9 % IV BOLUS: 500.0000 mL | Freq: Once | INTRAVENOUS | Status: DC

## 2018-10-24 MED ORDER — IOPAMIDOL (ISOVUE-370) INJECTION 76%: 100.0000 mL | Freq: Once | INTRAVENOUS | Status: DC | PRN

## 2018-10-24 MED ORDER — SODIUM CHLORIDE 0.9 % IV BOLUS: 1000.0000 mL | Freq: Once | INTRAVENOUS | Status: DC

## 2018-10-24 NOTE — Discharge Instructions (Addendum)
I am reassured that your blood pressure has improved here today, please continue taking your blood pressure medications as directed and follow-up closely with your primary care doctor for further evaluation and treatment of your high blood pressure.  Continue to check your blood pressure at home and keep a recording of your blood pressures.  The abdominal pain that you are having after meals could be related to ulcers since you have a history of these, your CT scan and evaluation today does not suggest you have mesenteric ischemia, an issue with blood flow to your intestines.  Continue taking your Protonix, avoid spicy or acidic foods and follow-up with your primary care doctor and GI doctor regarding the symptoms  Your CT scan did show: (please discuss with you PCP) - Small left-sided pulmonary nodules, please follow-up with your primary care doctor regarding these. - Narrowing of the femoral arteries potentially causing peripheral arterial disease, please discuss this with your PCP. - Signs of a right-sided complex renal cyst, may need further evaluation with a nonemergent MRI.

## 2018-10-24 NOTE — ED Provider Notes (Signed)
[facility] EMERGENCY DEPARTMENT Provider Note   CSN: [id number] Arrival date & time: 10/24/18  1134     History   Chief Complaint Chief Complaint  Patient presents with  . Hypertension  . Abdominal Pain    HPI Nicholas Shelton is a 77 y.o. male.  Nicholas Shelton is a 77 y.o. male with a history of hypertension, hyperlipidemia, diabetes, neuropathy, who presents to the emergency department for evaluation of elevated blood pressure.  Patient reports for the past 2-3 days he has noted multiple episodes of elevated blood pressure, he reports his blood pressures been running at about 210/110 which is much higher than usual.  Patient reports he is on several different blood pressure medications which he takes regularly and seems to maintain good control of her blood pressure with typically.  He has not missed any recent doses.  Patient reports that over the past few days he has noticed multiple episodes where his blood pressure seems to spike.  He has been able to manage this by taking extra doses of some of his blood pressure medications but is concerned because he is not sure what the cause could be.  He does report that this seems to occur after he is eating and he reports that for the past month or so after he eats he seems to get severe pain in his abdomen that then slowly resolves on its own without intervention.  He has not had any associated fevers, nausea, vomiting, diarrhea, melena or hematochezia.  Does report history of peptic ulcer disease, but again has not had any hematemesis, blood in the stools or dark stools.  He denies any chest pain or shortness of breath.  No lower extremity swelling.  No vision changes, headache, speech changes, numbness, weakness or tingling in any of his extremities.  Patient typically takes clonidine, amlodipine, lisinopril, HCTZ, and carvedilol for BP management.  He has not followed up with any providers regarding the abdominal pain he has been  having after meals.     Past Medical History:  Diagnosis Date  . Abnormal blood chemistry test   . Actinic keratosis   . Arthritis   . AV block   . Blood transfusion without reported diagnosis   . Bradycardia   . Degeneration of lumbar or lumbosacral intervertebral disc   . Deviated nasal septum   . Diabetes mellitus without complication ([place])   . Gastroesophageal reflux disease without esophagitis   . Generalized anxiety disorder   . Gout with tophus   . Headache   . Hearing loss   . Hyperlipidemia   . Hypertension   . Insomnia   . Ischemia   . Murmur   . Nasal turbinate hypertrophy   . Osteoarthritis of wrist   . PAC (premature atrial contraction)   . Peptic ulcer   . Peripheral neuropathy   . PVC (premature ventricular contraction)   . Seasonal allergic rhinitis due to pollen     There are no active problems to display for this patient.   Past Surgical History:  Procedure Laterality Date  . COLONOSCOPY    . ELBOW SURGERY     pinched nerve on both arms  . WRIST SURGERY     pinched nerve        Home Medications    Prior to Admission medications   Medication Sig Start Date End Date Taking? Authorizing Provider  allopurinol (ZYLOPRIM) 300 MG tablet Take 300 mg by mouth daily.   Yes [provider]  amLODipine (NORVASC) 10 MG tablet Take 10 mg by mouth daily.   Yes [provider]  atorvastatin (LIPITOR) 20 MG tablet Take 1 tablet (20 mg total) by mouth 2 (two) times a week. 11/09/17  Yes Deboraha Sprang, MD  carvedilol (COREG) 3.125 MG tablet Take 1 tablet (3.125 mg total) by mouth 2 (two) times daily with a meal. 11/06/17  Yes Deboraha Sprang, MD  cloNIDine (CATAPRES) 0.3 MG tablet Take 0.3 mg by mouth 3 (three) times daily.    Yes [provider]  hydrochlorothiazide (HYDRODIURIL) 25 MG tablet Take 25 mg by mouth daily.   Yes [provider]  lisinopril (PRINIVIL,ZESTRIL) 40 MG tablet Take 40 mg by mouth daily.   Yes  [provider]  metFORMIN (GLUCOPHAGE) 1000 MG tablet Take 1,000 mg by mouth 2 (two) times daily with a meal.   Yes [provider]  pantoprazole (PROTONIX) 40 MG tablet Take 40 mg by mouth daily.   Yes [provider]    Family History Family History  Problem Relation Age of Onset  . Colon polyps Brother   . Hypertension Brother   . Hypertension Sister   . Colon cancer Neg Hx   . Esophageal cancer Neg Hx   . Rectal cancer Neg Hx   . Stomach cancer Neg Hx     Social History Social History   Tobacco Use  . Smoking status: Former Research scientist (life sciences)  . Smokeless tobacco: Never Used  Substance Use Topics  . Alcohol use: No  . Drug use: No     Allergies   Patient has no known allergies.   Review of Systems Review of Systems  Constitutional: Negative for chills and fever.  HENT: Negative.   Eyes: Negative for visual disturbance.  Respiratory: Negative for cough and shortness of breath.   Cardiovascular: Negative for chest pain, palpitations and leg swelling.  Gastrointestinal: Positive for abdominal pain. Negative for abdominal distention, blood in stool, constipation, diarrhea, nausea and vomiting.  Genitourinary: Negative for dysuria and frequency.  Musculoskeletal: Negative for arthralgias and myalgias.  Skin: Negative for color change and rash.  Neurological: Negative for dizziness, syncope, facial asymmetry, speech difficulty, weakness, light-headedness, numbness and headaches.     Physical Exam Updated Vital Signs BP (!) 196/92 (BP Location: Right Arm)   Pulse 76   Temp 97.9 F (36.6 C) (Oral)   Resp 18   Ht 5\' 11"  (1.803 m)   Wt 88.5 kg   SpO2 100%   BMI 27.20 kg/m   Physical Exam Vitals signs and nursing note reviewed.  Constitutional:      General: He is not in acute distress.    Appearance: He is well-developed. He is not ill-appearing or diaphoretic.  HENT:     Head: Normocephalic and atraumatic.     Mouth/Throat:     Mouth:  Mucous membranes are moist.  Eyes:     General:        Right eye: No discharge.        Left eye: No discharge.     Extraocular Movements: Extraocular movements intact.     Pupils: Pupils are equal, round, and reactive to light.  Neck:     Musculoskeletal: Neck supple.  Cardiovascular:     Rate and Rhythm: Normal rate and regular rhythm.     Heart sounds: Normal heart sounds. No murmur. No friction rub. No gallop.   Pulmonary:     Effort: Pulmonary effort is normal. No respiratory distress.  Breath sounds: Normal breath sounds. No wheezing or rales.     Comments: Respirations equal and unlabored, patient able to speak in full sentences, lungs clear to auscultation bilaterally Abdominal:     General: Abdomen is flat. Bowel sounds are normal. There is no distension.     Palpations: Abdomen is soft. There is no mass.     Tenderness: There is no abdominal tenderness. There is no guarding or rebound.     Hernia: No hernia is present.     Comments: Abdomen soft, nondistended, nontender to palpation in all quadrants without guarding or peritoneal signs  Musculoskeletal:        General: No deformity.  Skin:    General: Skin is warm and dry.     Capillary Refill: Capillary refill takes less than 2 seconds.  Neurological:     General: No focal deficit present.     Mental Status: He is alert and oriented to person, place, and time.     Coordination: Coordination normal.     Comments: Speech is clear, able to follow commands CN III-XII intact Normal strength in upper and lower extremities bilaterally including dorsiflexion and plantar flexion, strong and equal grip strength Sensation normal to light and sharp touch Moves extremities without ataxia, coordination intact  Psychiatric:        Mood and Affect: Mood normal.        Behavior: Behavior normal.      ED Treatments / Results  Labs (all labs ordered are listed, but only abnormal results are displayed) Labs Reviewed    COMPREHENSIVE METABOLIC PANEL - Abnormal; Notable for the following components:      Result Value   Glucose, Bld 155 (*)    All other components within normal limits  CBC WITH DIFFERENTIAL/PLATELET - Abnormal; Notable for the following components:   RBC 3.97 (*)    Hemoglobin 11.6 (*)    HCT 34.4 (*)    All other components within normal limits  I-STAT CG4 LACTIC ACID, ED - Abnormal; Notable for the following components:   Lactic Acid, Venous 2.08 (*)    All other components within normal limits  LIPASE, BLOOD  I-STAT TROPONIN, ED  I-STAT CG4 LACTIC ACID, ED    EKG EKG Interpretation  Date/Time:  Sunday October 24 2018 11:45:49 EST Ventricular Rate:  69 PR Interval:    QRS Duration: 118 QT Interval:  385 QTC Calculation: 413 R Axis:   27 Text Interpretation:  Normal sinus rhythm Non-specific ST-t changes No old tracing to compare Confirmed by Pattricia Boss [phone]) on 10/25/2018 6:55:35 PM   Radiology Ct Angio Chest/abd/pel For Dissection W And/or Wo Contrast  Result Date: 10/24/2018 CLINICAL DATA:  Hypertension. Abdominal and back pain for the past 4 weeks. Evaluate for abdominal aortic dissection EXAM: CT ANGIOGRAPHY CHEST, ABDOMEN AND PELVIS TECHNIQUE: Multidetector CT imaging through the chest, abdomen and pelvis was performed using the standard protocol during bolus administration of intravenous contrast. Multiplanar reconstructed images and MIPs were obtained and reviewed to evaluate the vascular anatomy. CONTRAST:  179mL ISOVUE-370 IOPAMIDOL (ISOVUE-370) INJECTION 76% COMPARISON:  None. FINDINGS: CTA CHEST FINDINGS Vascular Findings: No evidence of thoracic aortic aneurysm or dissection on this nongated examination. Review of the precontrast images is negative for the presence of an intramural hematoma. There is a large amount of slightly irregular mixed calcified and noncalcified atherosclerotic plaque throughout the thoracic aorta, not resulting in hemodynamically  significant stenosis. Conventional configuration of the aortic arch. The branch vessels of the aortic  arch appear widely patent throughout their imaged courses. Borderline cardiomegaly. No pericardial effusion. Coronary artery calcifications. Although this examination was not tailored for the evaluation the pulmonary arteries, there are no discrete filling defects within the central pulmonary arterial tree to suggest central pulmonary embolism. Normal caliber of the main pulmonary artery. ------------------------------------------------------------- Thoracic aortic measurements: Sinotubular junction 32 mm as measured in greatest oblique coronal dimension. Proximal ascending aorta 37 mm as measured in greatest oblique axial dimension at the level of the main pulmonary artery (axial image 68, series 6) and approximately 36 mm in greatest oblique short axis coronal diameter (coronal image 51, series 9) Aortic arch aorta 29 mm as measured in greatest oblique sagittal dimension. Proximal descending thoracic aorta 29 mm as measured in greatest oblique axial dimension at the level of the main pulmonary artery. Distal descending thoracic aorta 29 mm as measured in greatest oblique axial dimension at the level of the diaphragmatic hiatus. Review of the MIP images confirms the above findings. ------------------------------------------------------------- Non-Vascular Findings: Mediastinum/Lymph Nodes: Subcarinal lymph node is prominent though not enlarged by size criteria measuring 0.8 cm in greatest short axis diameter, presumably reactive. No bulky mediastinal, hilar axillary lymphadenopathy. Lungs/Pleura: Minimal dependent subpleural ground-glass atelectasis. No discrete focal airspace opacities. No pleural effusion or pneumothorax. The central pulmonary airways appear widely patent. Punctate (sub 4 mm) left upper lobe pulmonary nodules (images 38 and 66, series 7). Musculoskeletal: No acute or aggressive osseous  abnormalities. Stigmata of DISH within the thoracic spine. Regional soft tissues appear normal. The thyroid gland appears mildly enlarged though without discrete nodule. _________________________________________________________ CTA ABDOMEN AND PELVIS FINDINGS VASCULAR Aorta: Moderate to large amount of slightly irregular mixed calcified and noncalcified atherosclerotic plaque throughout the abdominal aorta, not resulting in hemodynamically significant stenosis. No evidence of abdominal aortic dissection or periaortic stranding. Celiac: There is a minimal amount of mixed calcified and noncalcified atherosclerotic plaque involving the origin of the celiac artery, not resulting in a hemodynamically significant stenosis. The left gastric artery is incidentally noted to give rise to an accessory left hepatic artery. Otherwise, conventional branching pattern. SMA: There is a minimal amount of mixed calcified and noncalcified atherosclerotic plaque involving the origin and main trunk of the SMA, not resulting in hemodynamically significant stenosis. Conventional branching pattern. The distal tributaries the SMA appear widely patent without discrete intraluminal filling defect suggest distal embolism. Renals: Duplicated, nearly co-dominant right-sided renal arteries. Moderate amount of mixed calcified and noncalcified atherosclerotic plaque involves the origin and proximal aspect of the solitary left renal artery, not definitely resulting in a hemodynamically significant stenosis. No discrete areas of vessel irregularity to suggest FMD. IMA: Remains widely patent. Inflow: There is a moderate amount of eccentric calcified atherosclerotic plaque involving the origin of the left common iliac artery (image 221, series 6), not definitely resulting in a hemodynamically significant stenosis. The right common and bilateral external iliac arteries are diseased though without a definitive hemodynamically significant stenosis. The  bilateral internal iliac arteries are patent and of normal caliber. There is a moderate amount of eccentric mixed calcified and noncalcified atherosclerotic plaque within the right common femoral artery, not definitely resulting in a hemodynamically significant stenosis. Focal eccentric calcified atherosclerotic plaque within the left common femoral artery approaches 50% luminal narrowing (image 290, series 6). Eccentric mixed calcified and noncalcified atherosclerotic plaque involving the imaged proximal aspects of the bilateral superficial femoral arteries approaches 50% luminal narrowing bilaterally (right - image 308, series 6; left - image 311, series 6). Veins: The IVC and pelvic  venous system appear patent on this arterial phase examination. Review of the MIP images confirms the above findings. _________________________________________________________ NON-VASCULAR Evaluation of the abdominal organs is limited to the arterial phase of enhancement. Hepatobiliary: There is mild nodularity of the hepatic contour as could be seen in the setting of early cirrhotic change. No discrete hyperenhancing hepatic lesions. Normal appearance of the gallbladder given degree of distention. No radiopaque gallstones. No intra or extrahepatic biliary ductal dilatation. No ascites. Pancreas: Normal appearance of the pancreas. Spleen: Normal appearance of the spleen. Adrenals/Urinary Tract: There is symmetric enhancement of the bilateral kidneys. Note is made of bilateral renal cysts including dominant exophytic cyst arising from the superior pole of the left kidney measuring 7.6 x 5.6 x 6.9 cm and dominant partially exophytic cyst arising from the posterior inferior aspect of the left kidney measuring approximately 1.9 cm. There are 3 additional indeterminate right-sided renal lesions which can not be characterized as simple renal cysts with dominant partially exophytic cyst arising from the anterior interpolar aspect of the  right kidney measuring 1.7 cm in diameter (image 7172, series 6). Additional indeterminate right-sided renal lesions are seen on axial image 175 and coronal image 90, series 9. Stomach/Bowel: Large colonic stool burden without evidence of enteric obstruction. The bowel is normal in course and caliber without discrete area of wall thickening. No pneumoperitoneum, pneumatosis or portal venous gas. Lymphatic: Scattered retroperitoneal porta hepatis lymph nodes are numerous though individually not enlarged by size criteria. Index pre caval lymph node measures approximately 0.7 cm in greatest short axis diameter and contains internal dystrophic calcifications, likely the sequela of prior granulomatous infection (image 161, series 6). No bulky retroperitoneal, mesenteric, pelvic or inguinal lymphadenopathy. Reproductive: Normal appearance the prostate gland. No free fluid the pelvic cul-de-sac. Other: Diffuse body wall anasarca, most conspicuous about the midline low back. Tiny mesenteric fat containing periumbilical hernia. Small bilateral mesenteric fat containing indirect inguinal hernias, right greater than left. Musculoskeletal: No acute or aggressive osseous abnormalities. Mild-to-moderate multilevel lumbar spine DDD, worse at L5-S1 with disc space height loss, endplate irregularity and posteriorly directed disc osteophyte complex at this location. Review of the MIP images confirms the above findings. IMPRESSION: Chest Impression: 1. No acute cardiopulmonary disease. Specifically, no evidence of thoracic aortic aneurysm or dissection on this nongated examination. No evidence of intramural hematoma. 2. Coronary calcifications.  Aortic Atherosclerosis (ICD10-I70.0). 3. Punctate (sub 4 mm) left upper lobe pulmonary nodules. No follow-up needed if patient is low-risk (and has no known or suspected primary neoplasm). Non-contrast chest CT can be considered in 12 months if patient is high-risk. This recommendation follows  the consensus statement: Guidelines for Management of Incidental Pulmonary Nodules Detected on CT Images: From the Fleischner Society 2017; Radiology 2017; 284:228-243. Abdomen and Pelvis Impression: 1. No explanation for patient's abdominal and back pain. Specifically, no evidence of abdominal aortic aneurysm. 2. Aortic Atherosclerosis (ICD10-I70.0). 3. Suspected hemodynamically significant narrowings involving the left common femoral artery as well as the imaged proximal aspects of the bilateral superficial femoral arteries. Correlation for symptoms of PAD is advised. 4. 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 5. **An incidental finding of potential clinical significance has been found. Indeterminate right-sided renal lesions, potentially representative of minimally complex renal cyst though incompletely characterized on this examination. Further evaluation with dedicated nonemergent abdominal MRI could be performed as indicated. ** 6. Electronically Signed   By: Sandi Mariscal M.D.   On: 10/24/2018 14:25    Procedures Procedures (including critical care  time)  Medications Ordered in ED Medications  iopamidol (ISOVUE-370) 76 % injection (100 mLs  Contrast Given 10/24/18 1327)     Initial Impression / Assessment and Plan / ED Course  I have reviewed the triage vital signs and the nursing notes.  Pertinent labs & imaging results that were available during my care of the patient were reviewed by me and considered in my medical decision making (see chart for details).  Patient presents to the emergency department for evaluation of elevated blood pressures, he does have history of hypertension and is on 5 different medications to help control his blood pressure.  He reports that over the past few days he has noted blood pressures in the 210s over 110s, and this seems to be correlated with abdominal pain which is present after meals and then resolves on its own.  On arrival patient  hypertensive to the 190s/90s but vitals otherwise normal patient denies any associated chest pain, shortness of breath, leg swelling, headaches, vision changes, numbness or weakness.  Presentation is not suggestive of hypertensive urgency or emergency, but I am concerned for patient's severe abdominal pain and he reports after eating, he is currently pain-free, but given patient's history of severe hypertension, concern for possible AAA, mesenteric ischemia, patient also has history of PUD which could cause similar symptoms.  Case discussed with Dr. Thurmond Butts will obtain CT dissection study of the aorta as well as abdominal labs and lactic acid, EKG and troponin.  Blood pressure has improved here in the emergency department without intervention the patient continues to deny any current symptoms.  Labs show no leukocytosis, hemoglobin of 11.6, no acute electrolyte derangements requiring intervention, normal renal and liver function and normal lipase.  Troponin negative and EKG without concerning ischemic changes.  Initial lactic acid was slightly elevated at 2.08, but this normalized on recheck without any intervention.  Patient is not having any acute infectious symptoms.  CT scan shows no evidence of aortic aneurysm or dissection.  No evidence of PE, no signs of mesenteric ischemia.  There are some signs of narrowing of the femoral artery suggestive of possible PAD patient is not having any leg pain or claudication but we will have him follow-up with his primary care doctor regarding this.  Other incidental findings include small pulmonary nodules in the left lung, will have him follow-up with PCP for repeat imaging as needed.  There is also some abnormalities noted within the right kidney suggestive of possible cyst, this will require further evaluation with nonemergent imaging.  Patient provided copy of his radiology report to follow with his PCP, but no acute findings on CT here today.  I am reassured the  patient's blood pressures improved without intervention he has had no recurrence of abdominal pain here in the emergency department.  Lactic acid normalized without intervention.  Will have patient follow-up closely with his primary care doctor for further evaluation of blood pressure fluctuations and abdominal pain.  I do not see any further emergent medical condition requiring further treatment or evaluation.  Patient will be discharged home with close follow-up.  Return precautions been discussed.  Patient expresses understanding and is in agreement with plan  Patient discussed with Dr. Jeanell Sparrow, who saw patient as well and agrees with plan.  Vitals:   10/24/18 1245 10/24/18 1300 10/24/18 1445 10/24/18 1545  BP: (!) 159/65 (!) 158/63  (!) 169/94  Pulse: (!) 58 (!) 58 62 (!) 59  Resp: 19 13 14    Temp:  97.9 F (36.6 C)  TempSrc:      SpO2: 100% 99% 99% 99%  Weight:      Height:          Final Clinical Impressions(s) / ED Diagnoses   Final diagnoses:  Hypertension, unspecified type  Generalized abdominal pain  Pulmonary nodule    ED Discharge Orders    None       Janet Berlin 10/25/18 2329    Pattricia Boss, MD 11/08/18 [phone]

## 2018-10-24 NOTE — ED Triage Notes (Signed)
Patient reports high blood pressure has been very high recently. He reports that Last night  his BP was about 210/110, so he took extra BP meds. Also reports that this morning his BP was about 844B systolic but after eating, his BP "shot up." Also reports pain after eating and believes this is the reason that his BP keeps going up EDP at bedside

## 2018-10-24 NOTE — ED Notes (Signed)
Patient transported to CT 

## 2018-10-24 NOTE — ED Notes (Signed)
D/c reviewed with patient and family 

## 2018-10-30 ENCOUNTER — Emergency Department (HOSPITAL_COMMUNITY): Payer: Medicare Other | Admitting: Anesthesiology

## 2018-10-30 ENCOUNTER — Encounter (HOSPITAL_COMMUNITY): Payer: Self-pay

## 2018-10-30 ENCOUNTER — Emergency Department (HOSPITAL_COMMUNITY): Payer: Medicare Other

## 2018-10-30 ENCOUNTER — Other Ambulatory Visit: Payer: Self-pay

## 2018-10-30 ENCOUNTER — Inpatient Hospital Stay (HOSPITAL_COMMUNITY)
Admission: EM | Admit: 2018-10-30 | Discharge: 2018-11-02 | DRG: 352 | Disposition: A | Discharge status: Home / Self Care | Payer: Medicare Other | Attending: General Surgery | Admitting: General Surgery

## 2018-10-30 ENCOUNTER — Encounter (HOSPITAL_COMMUNITY): Admission: EM | Disposition: A | Discharge status: Home / Self Care | Payer: Self-pay | Source: Home / Self Care

## 2018-10-30 DIAGNOSIS — I10 Essential (primary) hypertension: Secondary | ICD-10-CM | POA: Diagnosis present

## 2018-10-30 DIAGNOSIS — N281 Cyst of kidney, acquired: Secondary | ICD-10-CM | POA: Diagnosis present

## 2018-10-30 DIAGNOSIS — K4030 Unilateral inguinal hernia, with obstruction, without gangrene, not specified as recurrent: Principal | ICD-10-CM | POA: Diagnosis present

## 2018-10-30 DIAGNOSIS — Z87891 Personal history of nicotine dependence: Secondary | ICD-10-CM

## 2018-10-30 DIAGNOSIS — E860 Dehydration: Secondary | ICD-10-CM | POA: Diagnosis present

## 2018-10-30 DIAGNOSIS — Z8371 Family history of colonic polyps: Secondary | ICD-10-CM

## 2018-10-30 DIAGNOSIS — R7989 Other specified abnormal findings of blood chemistry: Secondary | ICD-10-CM | POA: Diagnosis present

## 2018-10-30 DIAGNOSIS — I4430 Unspecified atrioventricular block: Secondary | ICD-10-CM | POA: Diagnosis present

## 2018-10-30 DIAGNOSIS — K5900 Constipation, unspecified: Secondary | ICD-10-CM | POA: Diagnosis present

## 2018-10-30 DIAGNOSIS — R001 Bradycardia, unspecified: Secondary | ICD-10-CM | POA: Diagnosis present

## 2018-10-30 DIAGNOSIS — Z8249 Family history of ischemic heart disease and other diseases of the circulatory system: Secondary | ICD-10-CM | POA: Diagnosis not present

## 2018-10-30 DIAGNOSIS — D176 Benign lipomatous neoplasm of spermatic cord: Secondary | ICD-10-CM | POA: Diagnosis present

## 2018-10-30 DIAGNOSIS — K219 Gastro-esophageal reflux disease without esophagitis: Secondary | ICD-10-CM | POA: Diagnosis present

## 2018-10-30 DIAGNOSIS — Z79899 Other long term (current) drug therapy: Secondary | ICD-10-CM

## 2018-10-30 DIAGNOSIS — Z7984 Long term (current) use of oral hypoglycemic drugs: Secondary | ICD-10-CM

## 2018-10-30 DIAGNOSIS — E785 Hyperlipidemia, unspecified: Secondary | ICD-10-CM | POA: Diagnosis present

## 2018-10-30 DIAGNOSIS — E1142 Type 2 diabetes mellitus with diabetic polyneuropathy: Secondary | ICD-10-CM | POA: Diagnosis present

## 2018-10-30 DIAGNOSIS — N2889 Other specified disorders of kidney and ureter: Secondary | ICD-10-CM | POA: Diagnosis present

## 2018-10-30 HISTORY — PX: INGUINAL HERNIA REPAIR: SHX194

## 2018-10-30 LAB — COMPREHENSIVE METABOLIC PANEL
ALT: 18 U/L (ref 0–44)
AST: 24 U/L (ref 15–41)
Albumin: 4.2 g/dL (ref 3.5–5.0)
Alkaline Phosphatase: 53 U/L (ref 38–126)
Anion gap: 14 (ref 5–15)
BUN: 37 mg/dL — ABNORMAL HIGH (ref 8–23)
CO2: 25 mmol/L (ref 22–32)
Calcium: 9.8 mg/dL (ref 8.9–10.3)
Chloride: 97 mmol/L — ABNORMAL LOW (ref 98–111)
Creatinine, Ser: 1.37 mg/dL — ABNORMAL HIGH (ref 0.61–1.24)
GFR calc Af Amer: 56 mL/min — ABNORMAL LOW (ref >60)
GFR calc non Af Amer: 49 mL/min — ABNORMAL LOW (ref >60)
Glucose, Bld: 147 mg/dL — ABNORMAL HIGH (ref 70–99)
POTASSIUM: 3.8 mmol/L (ref 3.5–5.1)
SODIUM: 136 mmol/L (ref 135–145)
Total Bilirubin: 1.2 mg/dL (ref 0.3–1.2)
Total Protein: 7.3 g/dL (ref 6.5–8.1)

## 2018-10-30 LAB — CBC WITH DIFFERENTIAL/PLATELET
Abs Immature Granulocytes: 0.07 10*3/uL (ref 0.00–0.07)
BASOS PCT: 0 %
Basophils Absolute: 0 10*3/uL (ref 0.0–0.1)
Eosinophils Absolute: 0.1 10*3/uL (ref 0.0–0.5)
Eosinophils Relative: 1 %
HCT: 36.1 % — ABNORMAL LOW (ref 39.0–52.0)
Hemoglobin: 12.3 g/dL — ABNORMAL LOW (ref 13.0–17.0)
Immature Granulocytes: 0 %
Lymphocytes Relative: 4 %
Lymphs Abs: 0.6 10*3/uL — ABNORMAL LOW (ref 0.7–4.0)
MCH: 29.6 pg (ref 26.0–34.0)
MCHC: 34.1 g/dL (ref 30.0–36.0)
MCV: 86.8 fL (ref 80.0–100.0)
Monocytes Absolute: 1 10*3/uL (ref 0.1–1.0)
Monocytes Relative: 6 %
NRBC: 0 % (ref 0.0–0.2)
Neutro Abs: 14.8 10*3/uL — ABNORMAL HIGH (ref 1.7–7.7)
Neutrophils Relative %: 89 %
Platelets: 192 10*3/uL (ref 150–400)
RBC: 4.16 MIL/uL — AB (ref 4.22–5.81)
RDW: 12.8 % (ref 11.5–15.5)
WBC: 16.6 10*3/uL — ABNORMAL HIGH (ref 4.0–10.5)

## 2018-10-30 LAB — URINALYSIS, MICROSCOPIC (REFLEX): Squamous Epithelial / HPF: NONE SEEN (ref 0–5)

## 2018-10-30 LAB — URINALYSIS, ROUTINE W REFLEX MICROSCOPIC
Glucose, UA: NEGATIVE mg/dL
Hgb urine dipstick: NEGATIVE
Ketones, ur: 15 mg/dL — AB
Leukocytes, UA: NEGATIVE
Nitrite: NEGATIVE
Protein, ur: >300 mg/dL — AB
Specific Gravity, Urine: 1.025 (ref 1.005–1.030)
pH: 5.5 (ref 5.0–8.0)

## 2018-10-30 LAB — GLUCOSE, CAPILLARY: Glucose-Capillary: 142 mg/dL — ABNORMAL HIGH (ref 70–99)

## 2018-10-30 LAB — LIPASE, BLOOD: Lipase: 25 U/L (ref 11–51)

## 2018-10-30 IMAGING — CT CT ABD-PELV W/ CM
2 of 5 series · 16 of 46 positions shown, 18 images · IV contrast (omnipaque)
Comparison: None

CLINICAL DATA: Bowel obstruction, high-grade, constipation, last
bowel movement 4 days ago, history of diabetes mellitus,
hypertension, arrhythmia is, former smoker, GERD

EXAM:
CT ABDOMEN AND PELVIS WITH CONTRAST
TECHNIQUE: Multidetector CT imaging of the abdomen and pelvis was performed
using the standard protocol following bolus administration of
intravenous contrast. Sagittal and coronal MPR images reconstructed
from axial data set.
CONTRAST:  100mL OMNIPAQUE IOHEXOL 300 MG/ML SOLN IV. No oral
contrast.

[Series 3: a/p w/ 5mm · axial · 0.84mm/px · z∈[+752,+1227]mm · 13 of 107 slices shown, 15 images]
[im 6/107  soft-tissue]
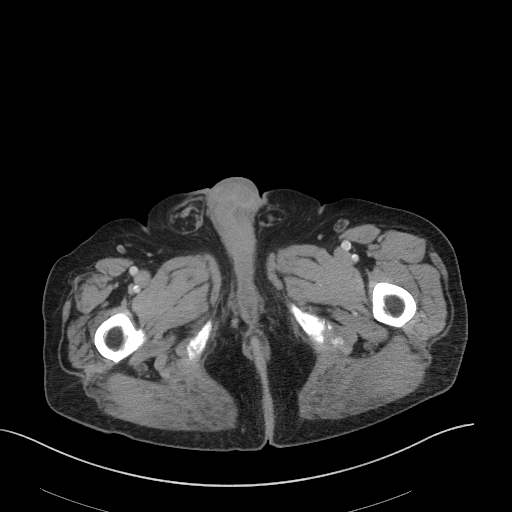
[im 6/107  bone]
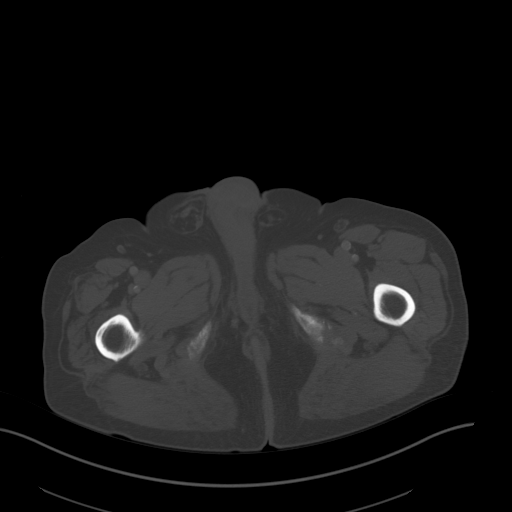
[im 16/107  soft-tissue]
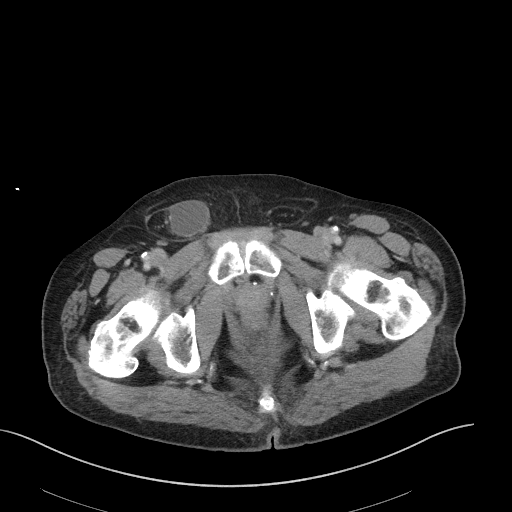
[im 22/107  soft-tissue]
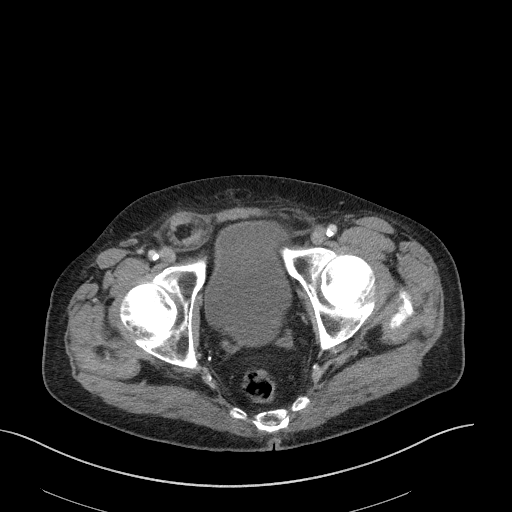
[im 32/107  soft-tissue]
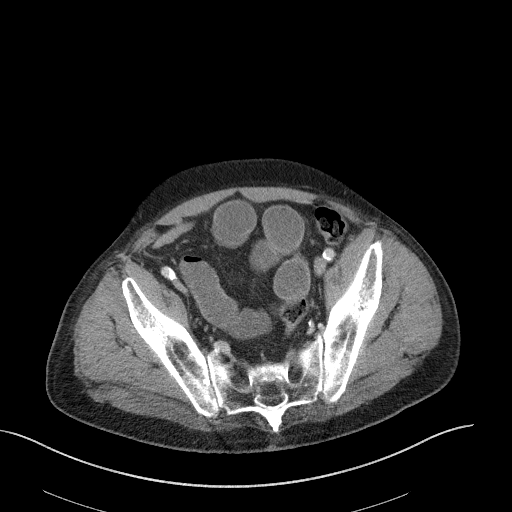
[im 38/107  soft-tissue]
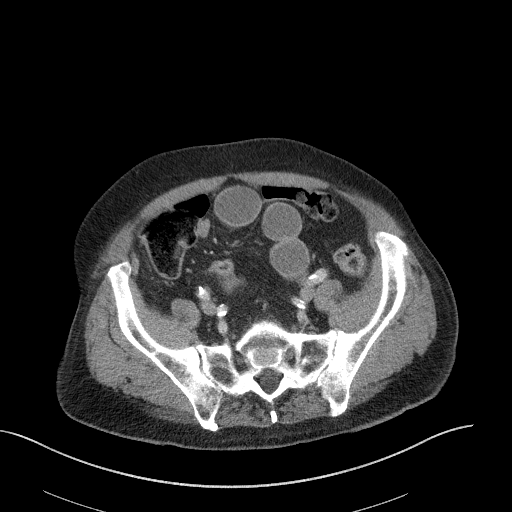
[im 48/107  soft-tissue]
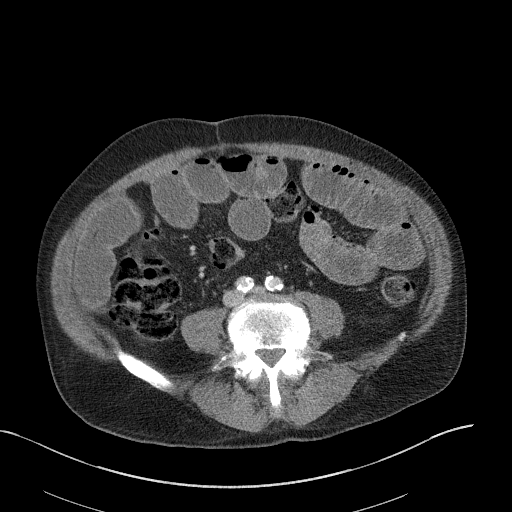
[im 54/107  soft-tissue]
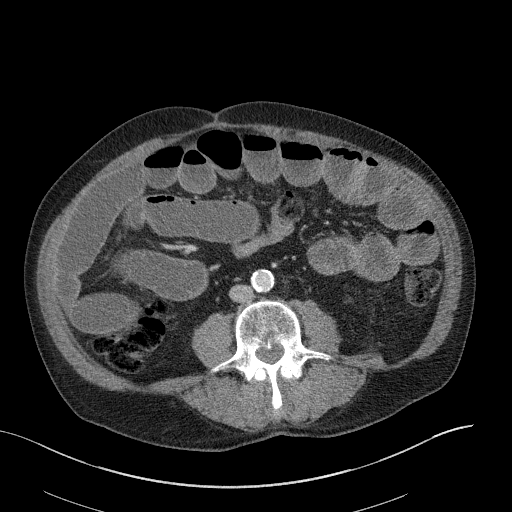
[im 59/107  soft-tissue]
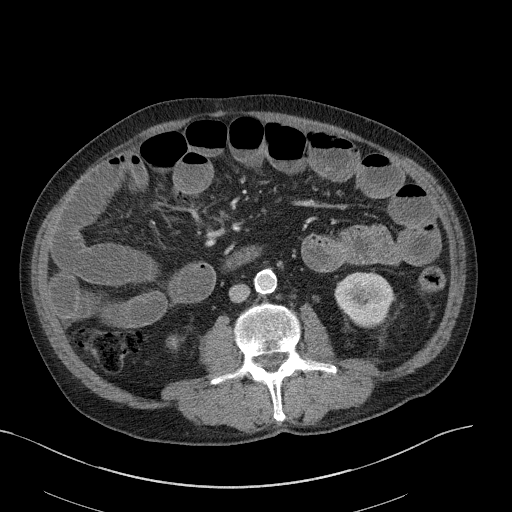
[im 69/107  soft-tissue]
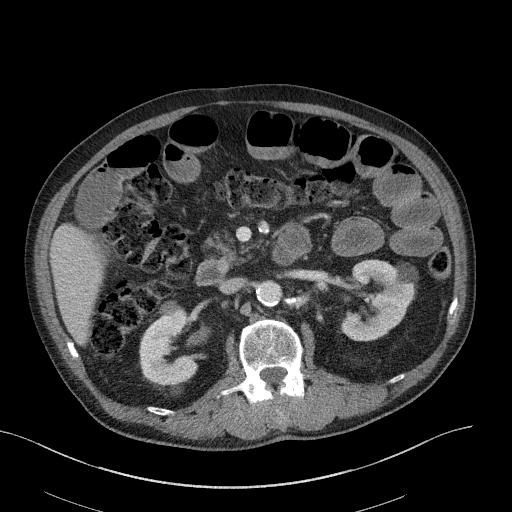
[im 69/107  bone]
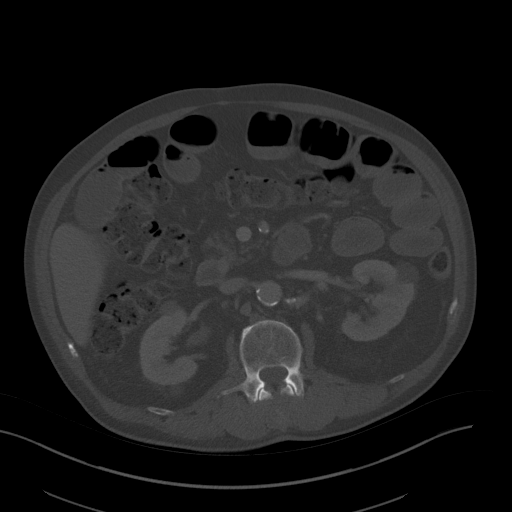
[im 75/107  soft-tissue]
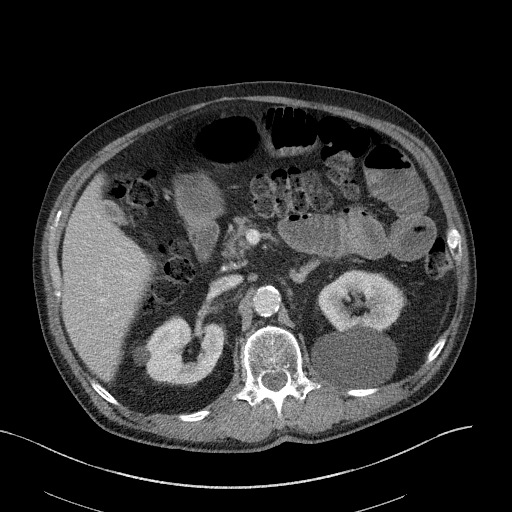
[im 85/107  soft-tissue]
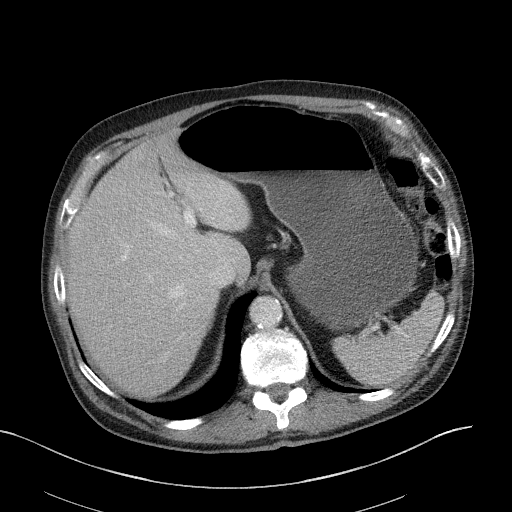
[im 91/107  soft-tissue]
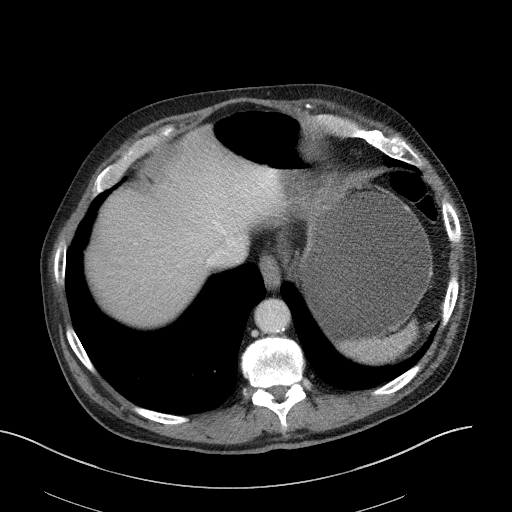
[im 101/107  soft-tissue]
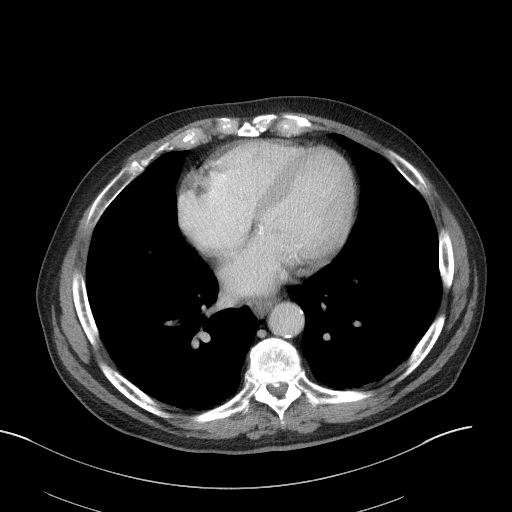

[Series 6: a/p w/ cor · coronal · 0.89mm/px · 3 of 151 slices shown]
[im 51/151  soft-tissue]
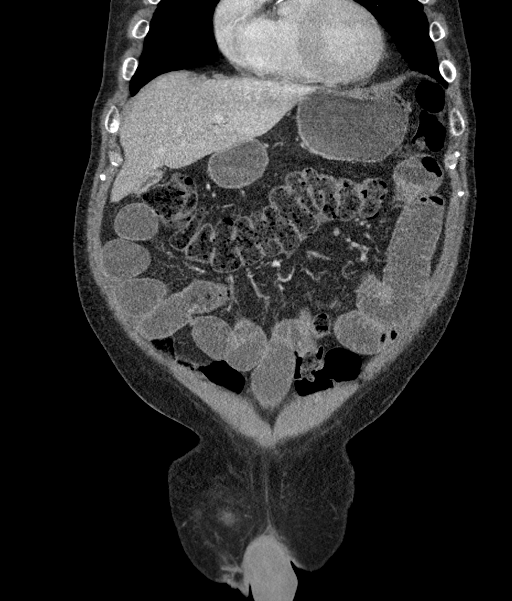
[im 67/151  soft-tissue]
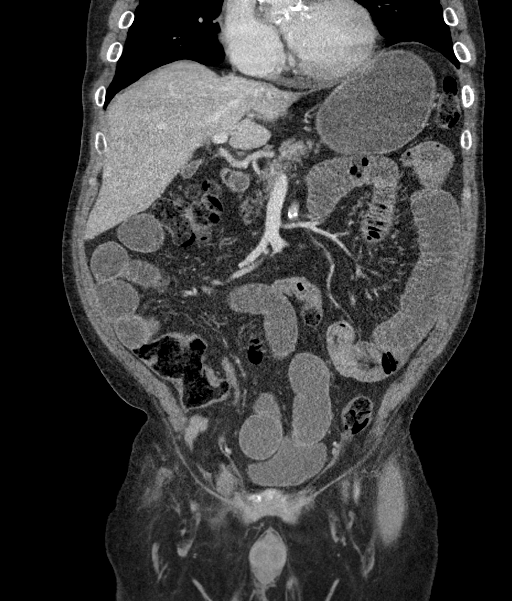
[im 84/151  soft-tissue]
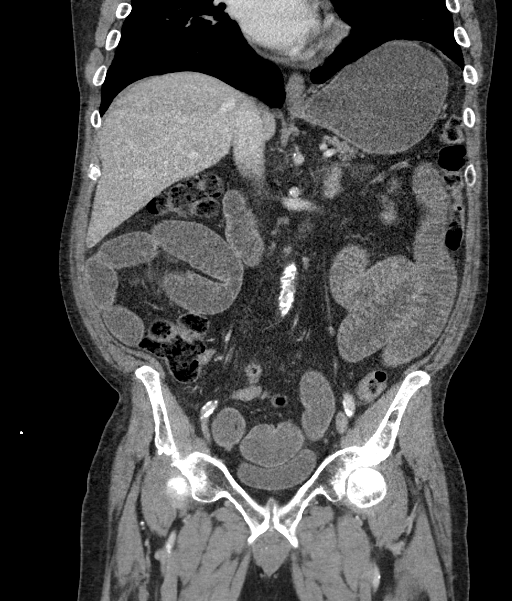

[16 of 46 positions shown; findings below may reference images not displayed]

FINDINGS: Lower chest: Lung bases hyperinflated with minimal dependent
atelectasis

Hepatobiliary: Contracted gallbladder.  Liver normal appearance

Pancreas: Atrophic pancreas

Spleen: Normal appearance

Adrenals/Urinary Tract: Adrenal glands normal appearance. Multiple
BILATERAL renal cysts, largest posterior to LEFT kidney 7.6 x
cm. Intermediate attenuation exophytic nodule RIGHT kidney 14 x 10
mm question hyperdense or hemorrhagic cyst. No hydronephrosis or
hydroureter. Bladder unremarkable.

Stomach/Bowel: Stomach distended with fluid. Proximal and mid small
bowel loops are distended by fluid with decompressed distal small
bowel loops consistent with small bowel obstruction. Point of
obstruction is at a small bowel loop within a RIGHT inguinal hernia.
Colon decompressed. Normal appendix.

Vascular/Lymphatic: Atherosclerotic calcifications aorta and iliac
arteries. Few pelvic phleboliths. Minimal coronary arterial
calcification. Aorta normal caliber. No adenopathy.

Reproductive: Normal sized prostate gland

Other: No free air or free fluid. No bowel wall thickening or acute
inflammatory process.

Musculoskeletal: Degenerative disc disease changes L5-S1 with a
piece to no CIS of the spinal canal.
IMPRESSION: Distal small-bowel obstruction secondary to herniation of of a
distal small bowel loop into a RIGHT inguinal hernia.

No evidence of bowel perforation or wall thickening.

Multiple renal cysts with an additional hyperdense 14 x 10 mm RIGHT
renal nodule, potentially a hemorrhagic or complicated cyst,
recommend follow-up non emergent ultrasound assessment following
resolution of patient's bowel obstruction to characterize.

## 2018-10-30 SURGERY — REPAIR, HERNIA, INGUINAL, INCARCERATED
Anesthesia: General | Site: Abdomen | Laterality: Right

## 2018-10-30 MED ORDER — CARVEDILOL 3.125 MG PO TABS
3.1250 mg | ORAL_TABLET | Freq: Two times a day (BID) | ORAL | Status: DC
  Administered 2018-10-31 – 2018-11-02 (×5): 3.125 mg via ORAL
  Filled 2018-10-30 (×5): qty 1

## 2018-10-30 MED ORDER — MORPHINE SULFATE (PF) 2 MG/ML IV SOLN: 1.0000 mg | INTRAVENOUS | Status: DC | PRN

## 2018-10-30 MED ORDER — PROPOFOL 10 MG/ML IV BOLUS
INTRAVENOUS | Status: DC | PRN
  Administered 2018-10-30: 20 mg via INTRAVENOUS
  Administered 2018-10-30: 140 mg via INTRAVENOUS
  Administered 2018-10-30: 20 mg via INTRAVENOUS

## 2018-10-30 MED ORDER — FENTANYL CITRATE (PF) 100 MCG/2ML IJ SOLN: 25.0000 ug | INTRAMUSCULAR | Status: DC | PRN

## 2018-10-30 MED ORDER — LACTATED RINGERS IV SOLN
INTRAVENOUS | Status: DC | PRN
  Administered 2018-10-30 (×3): via INTRAVENOUS

## 2018-10-30 MED ORDER — ONDANSETRON HCL 4 MG/2ML IJ SOLN
INTRAMUSCULAR | Status: AC
  Filled 2018-10-30: qty 2

## 2018-10-30 MED ORDER — PROPOFOL 10 MG/ML IV BOLUS
INTRAVENOUS | Status: AC
  Filled 2018-10-30: qty 20

## 2018-10-30 MED ORDER — SENNOSIDES-DOCUSATE SODIUM 8.6-50 MG PO TABS: 1.0000 | ORAL_TABLET | Freq: Two times a day (BID) | ORAL | 0 refills | Status: DC | PRN

## 2018-10-30 MED ORDER — BUPIVACAINE-EPINEPHRINE 0.5% -1:200000 IJ SOLN
INTRAMUSCULAR | Status: DC | PRN
  Administered 2018-10-30: 10 mL

## 2018-10-30 MED ORDER — ONDANSETRON HCL 4 MG/2ML IJ SOLN: 4.0000 mg | Freq: Four times a day (QID) | INTRAMUSCULAR | Status: DC | PRN

## 2018-10-30 MED ORDER — FENTANYL CITRATE (PF) 250 MCG/5ML IJ SOLN
INTRAMUSCULAR | Status: DC | PRN
  Administered 2018-10-30 (×5): 50 ug via INTRAVENOUS

## 2018-10-30 MED ORDER — ROCURONIUM BROMIDE 50 MG/5ML IV SOSY
PREFILLED_SYRINGE | INTRAVENOUS | Status: AC
  Filled 2018-10-30: qty 5

## 2018-10-30 MED ORDER — CEFAZOLIN SODIUM-DEXTROSE 2-3 GM-%(50ML) IV SOLR
INTRAVENOUS | Status: DC | PRN
  Administered 2018-10-30: 2 g via INTRAVENOUS

## 2018-10-30 MED ORDER — IOHEXOL 300 MG/ML  SOLN
100.0000 mL | Freq: Once | INTRAMUSCULAR | Status: AC | PRN
  Administered 2018-10-30: 100 mL via INTRAVENOUS

## 2018-10-30 MED ORDER — TRAMADOL HCL 50 MG PO TABS: 50.0000 mg | ORAL_TABLET | Freq: Four times a day (QID) | ORAL | Status: DC | PRN

## 2018-10-30 MED ORDER — PANTOPRAZOLE SODIUM 40 MG IV SOLR
40.0000 mg | Freq: Every day | INTRAVENOUS | Status: DC
  Administered 2018-10-31 – 2018-11-01 (×3): 40 mg via INTRAVENOUS
  Filled 2018-10-30 (×3): qty 40

## 2018-10-30 MED ORDER — LACTULOSE 10 GM/15ML PO SOLN: 30.0000 g | Freq: Every day | ORAL | 0 refills | Status: DC | PRN

## 2018-10-30 MED ORDER — AMLODIPINE BESYLATE 10 MG PO TABS
10.0000 mg | ORAL_TABLET | Freq: Every day | ORAL | Status: DC
  Administered 2018-10-31 – 2018-11-02 (×3): 10 mg via ORAL
  Filled 2018-10-30 (×3): qty 1

## 2018-10-30 MED ORDER — ENOXAPARIN SODIUM 40 MG/0.4ML ~~LOC~~ SOLN
40.0000 mg | SUBCUTANEOUS | Status: DC
  Administered 2018-10-31 – 2018-11-01 (×2): 40 mg via SUBCUTANEOUS
  Filled 2018-10-30 (×2): qty 0.4

## 2018-10-30 MED ORDER — SUGAMMADEX SODIUM 200 MG/2ML IV SOLN
INTRAVENOUS | Status: DC | PRN
  Administered 2018-10-30: 200 mg via INTRAVENOUS

## 2018-10-30 MED ORDER — SODIUM CHLORIDE 0.9 % IV SOLN
Freq: Once | INTRAVENOUS | Status: AC
  Administered 2018-10-30: 19:00:00 via INTRAVENOUS

## 2018-10-30 MED ORDER — LISINOPRIL 40 MG PO TABS
40.0000 mg | ORAL_TABLET | Freq: Every day | ORAL | Status: DC
  Administered 2018-10-31 – 2018-11-02 (×3): 40 mg via ORAL
  Filled 2018-10-30 (×3): qty 1

## 2018-10-30 MED ORDER — GABAPENTIN 100 MG PO CAPS
100.0000 mg | ORAL_CAPSULE | Freq: Two times a day (BID) | ORAL | Status: DC
  Administered 2018-10-31 – 2018-11-02 (×5): 100 mg via ORAL
  Filled 2018-10-30 (×5): qty 1

## 2018-10-30 MED ORDER — ACETAMINOPHEN 325 MG PO TABS
650.0000 mg | ORAL_TABLET | Freq: Four times a day (QID) | ORAL | Status: DC
  Administered 2018-10-31 – 2018-11-02 (×10): 650 mg via ORAL
  Filled 2018-10-30 (×10): qty 2

## 2018-10-30 MED ORDER — SODIUM CHLORIDE 0.9 % IV SOLN
INTRAVENOUS | Status: DC | PRN
  Administered 2018-10-30: 500 mL

## 2018-10-30 MED ORDER — DIPHENHYDRAMINE HCL 12.5 MG/5ML PO ELIX: 12.5000 mg | ORAL_SOLUTION | Freq: Four times a day (QID) | ORAL | Status: DC | PRN

## 2018-10-30 MED ORDER — LIDOCAINE 2% (20 MG/ML) 5 ML SYRINGE
INTRAMUSCULAR | Status: AC
  Filled 2018-10-30: qty 5

## 2018-10-30 MED ORDER — EPHEDRINE SULFATE 50 MG/ML IJ SOLN
INTRAMUSCULAR | Status: DC | PRN
  Administered 2018-10-30: 5 mg via INTRAVENOUS

## 2018-10-30 MED ORDER — SODIUM CHLORIDE 0.9 % IV BOLUS
1000.0000 mL | Freq: Once | INTRAVENOUS | Status: AC
  Administered 2018-10-30: 1000 mL via INTRAVENOUS

## 2018-10-30 MED ORDER — HYDRALAZINE HCL 20 MG/ML IJ SOLN: 10.0000 mg | INTRAMUSCULAR | Status: DC | PRN

## 2018-10-30 MED ORDER — TETRAHYDROZOLINE HCL 0.05 % OP SOLN
1.0000 [drp] | Freq: Every day | OPHTHALMIC | Status: DC | PRN
  Filled 2018-10-30: qty 15

## 2018-10-30 MED ORDER — POTASSIUM CHLORIDE IN NACL 20-0.45 MEQ/L-% IV SOLN
INTRAVENOUS | Status: DC
  Administered 2018-10-31 – 2018-11-01 (×4): via INTRAVENOUS
  Filled 2018-10-30 (×5): qty 1000

## 2018-10-30 MED ORDER — SODIUM CHLORIDE (PF) 0.9 % IJ SOLN
INTRAMUSCULAR | Status: AC
  Filled 2018-10-30: qty 10

## 2018-10-30 MED ORDER — SUCCINYLCHOLINE CHLORIDE 200 MG/10ML IV SOSY
PREFILLED_SYRINGE | INTRAVENOUS | Status: AC
  Filled 2018-10-30: qty 10

## 2018-10-30 MED ORDER — LIDOCAINE HCL (CARDIAC) PF 100 MG/5ML IV SOSY
PREFILLED_SYRINGE | INTRAVENOUS | Status: DC | PRN
  Administered 2018-10-30: 60 mg via INTRATRACHEAL

## 2018-10-30 MED ORDER — INSULIN ASPART 100 UNIT/ML ~~LOC~~ SOLN
0.0000 [IU] | SUBCUTANEOUS | Status: DC
  Administered 2018-10-31 (×2): 2 [IU] via SUBCUTANEOUS
  Administered 2018-10-31: 3 [IU] via SUBCUTANEOUS
  Administered 2018-10-31 – 2018-11-01 (×3): 2 [IU] via SUBCUTANEOUS

## 2018-10-30 MED ORDER — ONDANSETRON 4 MG PO TBDP: 4.0000 mg | ORAL_TABLET | Freq: Four times a day (QID) | ORAL | Status: DC | PRN

## 2018-10-30 MED ORDER — DIPHENHYDRAMINE HCL 50 MG/ML IJ SOLN: 12.5000 mg | Freq: Four times a day (QID) | INTRAMUSCULAR | Status: DC | PRN

## 2018-10-30 MED ORDER — ROCURONIUM BROMIDE 100 MG/10ML IV SOLN
INTRAVENOUS | Status: DC | PRN
  Administered 2018-10-30: 20 mg via INTRAVENOUS
  Administered 2018-10-30: 10 mg via INTRAVENOUS
  Administered 2018-10-30: 30 mg via INTRAVENOUS

## 2018-10-30 MED ORDER — CEFAZOLIN SODIUM-DEXTROSE 2-4 GM/100ML-% IV SOLN: 2.0000 g | Freq: Once | INTRAVENOUS | Status: DC

## 2018-10-30 MED ORDER — PHENYLEPHRINE 40 MCG/ML (10ML) SYRINGE FOR IV PUSH (FOR BLOOD PRESSURE SUPPORT)
PREFILLED_SYRINGE | INTRAVENOUS | Status: AC
  Filled 2018-10-30: qty 10

## 2018-10-30 MED ORDER — ONDANSETRON HCL 4 MG/2ML IJ SOLN
4.0000 mg | Freq: Once | INTRAMUSCULAR | Status: AC
  Administered 2018-10-30: 4 mg via INTRAVENOUS
  Filled 2018-10-30: qty 2

## 2018-10-30 MED ORDER — EPHEDRINE 5 MG/ML INJ
INTRAVENOUS | Status: AC
  Filled 2018-10-30: qty 10

## 2018-10-30 MED ORDER — ALLOPURINOL 300 MG PO TABS
300.0000 mg | ORAL_TABLET | Freq: Every day | ORAL | Status: DC
  Administered 2018-10-31 – 2018-11-02 (×3): 300 mg via ORAL
  Filled 2018-10-30 (×3): qty 1

## 2018-10-30 MED ORDER — ARTIFICIAL TEARS OPHTHALMIC OINT
TOPICAL_OINTMENT | OPHTHALMIC | Status: AC
  Filled 2018-10-30: qty 3.5

## 2018-10-30 MED ORDER — ONDANSETRON HCL 4 MG/2ML IJ SOLN: 4.0000 mg | Freq: Once | INTRAMUSCULAR | Status: DC | PRN

## 2018-10-30 MED ORDER — SODIUM CHLORIDE 0.9 % IV SOLN
INTRAVENOUS | Status: AC
  Filled 2018-10-30: qty 500000

## 2018-10-30 MED ORDER — CLONIDINE HCL 0.2 MG PO TABS
0.6000 mg | ORAL_TABLET | Freq: Two times a day (BID) | ORAL | Status: DC
  Administered 2018-10-31 – 2018-11-02 (×5): 0.6 mg via ORAL
  Filled 2018-10-30 (×5): qty 3

## 2018-10-30 MED ORDER — BUPIVACAINE-EPINEPHRINE (PF) 0.25% -1:200000 IJ SOLN
INTRAMUSCULAR | Status: AC
  Filled 2018-10-30: qty 30

## 2018-10-30 MED ORDER — FENTANYL CITRATE (PF) 250 MCG/5ML IJ SOLN
INTRAMUSCULAR | Status: AC
  Filled 2018-10-30: qty 5

## 2018-10-30 MED ORDER — ONDANSETRON HCL 4 MG/2ML IJ SOLN
INTRAMUSCULAR | Status: DC | PRN
  Administered 2018-10-30: 4 mg via INTRAVENOUS

## 2018-10-30 MED ORDER — SUCCINYLCHOLINE CHLORIDE 20 MG/ML IJ SOLN
INTRAMUSCULAR | Status: DC | PRN
  Administered 2018-10-30: 120 mg via INTRAVENOUS

## 2018-10-30 SURGICAL SUPPLY — 40 items
BAG DECANTER FOR FLEXI CONT (MISCELLANEOUS) ×3 IMPLANT
BLADE CLIPPER SURG (BLADE) ×3 IMPLANT
CANISTER SUCT 3000ML PPV (MISCELLANEOUS) ×3 IMPLANT
COVER SURGICAL LIGHT HANDLE (MISCELLANEOUS) ×3 IMPLANT
COVER WAND RF STERILE (DRAPES) ×3 IMPLANT
DERMABOND ADVANCED (GAUZE/BANDAGES/DRESSINGS) ×2
DERMABOND ADVANCED .7 DNX12 (GAUZE/BANDAGES/DRESSINGS) ×1 IMPLANT
DRAIN PENROSE 1/2X12 LTX STRL (WOUND CARE) ×3 IMPLANT
DRAPE LAPAROSCOPIC ABDOMINAL (DRAPES) ×3 IMPLANT
DRAPE UTILITY XL STRL (DRAPES) ×6 IMPLANT
ELECT REM PT RETURN 9FT ADLT (ELECTROSURGICAL) ×3
ELECTRODE REM PT RTRN 9FT ADLT (ELECTROSURGICAL) ×1 IMPLANT
GAUZE SPONGE 4X4 12PLY STRL (GAUZE/BANDAGES/DRESSINGS) IMPLANT
GLOVE BIO SURGEON STRL SZ7 (GLOVE) ×3 IMPLANT
GLOVE BIOGEL PI IND STRL 7.5 (GLOVE) ×2 IMPLANT
GLOVE BIOGEL PI IND STRL 8 (GLOVE) ×1 IMPLANT
GLOVE BIOGEL PI INDICATOR 7.5 (GLOVE) ×4
GLOVE BIOGEL PI INDICATOR 8 (GLOVE) ×2
GLOVE SURG SS PI 7.0 STRL IVOR (GLOVE) ×3 IMPLANT
GOWN STRL REUS W/ TWL LRG LVL3 (GOWN DISPOSABLE) ×2 IMPLANT
GOWN STRL REUS W/TWL LRG LVL3 (GOWN DISPOSABLE) ×4
KIT BASIN OR (CUSTOM PROCEDURE TRAY) ×3 IMPLANT
KIT TURNOVER KIT B (KITS) ×3 IMPLANT
MESH ULTRAPRO 3X6 7.6X15CM (Mesh General) ×3 IMPLANT
NEEDLE HYPO 25GX1X1/2 BEV (NEEDLE) ×3 IMPLANT
NS IRRIG 1000ML POUR BTL (IV SOLUTION) ×3 IMPLANT
PACK GENERAL/GYN (CUSTOM PROCEDURE TRAY) ×3 IMPLANT
PAD ARMBOARD 7.5X6 YLW CONV (MISCELLANEOUS) ×6 IMPLANT
STAPLER VISISTAT 35W (STAPLE) IMPLANT
SUT MNCRL AB 4-0 PS2 18 (SUTURE) ×3 IMPLANT
SUT NOVA NAB GS-21 0 18 T12 DT (SUTURE) IMPLANT
SUT NOVA NAB GS-21 1 T12 (SUTURE) IMPLANT
SUT PROLENE 2 0 CT 1 (SUTURE) ×9 IMPLANT
SUT VIC AB 2-0 SH 27 (SUTURE)
SUT VIC AB 2-0 SH 27X BRD (SUTURE) IMPLANT
SUT VIC AB 3-0 SH 27 (SUTURE) ×2
SUT VIC AB 3-0 SH 27XBRD (SUTURE) ×1 IMPLANT
SYR CONTROL 10ML LL (SYRINGE) ×3 IMPLANT
TOWEL OR 17X24 6PK STRL BLUE (TOWEL DISPOSABLE) ×3 IMPLANT
TOWEL OR 17X26 10 PK STRL BLUE (TOWEL DISPOSABLE) ×3 IMPLANT

## 2018-10-30 NOTE — H&P (Addendum)
Nicholas Shelton is an 77 y.o. male.   Chief Complaint: Vomiting HPI: 77 year old Caucasian male came to the emergency room earlier today with primary concerns for constipation.  He had not had a bowel movement for 4 days.  He tried several over-the-counter remedies without success.  He took some of his daughters Baker Pierini and after that developed abdominal pain along with multiple episodes of emesis.  He denies any fevers or chills.  He typically has irregular bowel movements and has a bowel movement every 3 or 4 days.  He denies any overt groin pain except when the area was pushed on a short time ago by the ER physician.  He denies noticing a bulge in his groin.  He denies any prior abdominal surgery.  He does not smoke or drink.  He drives.  Denies any dysuria.  He was in the emergency room about 6 days ago complaining of hypertension as well as intermittent episodes of abdominal pain after eating that would slowly resolve on its own.  He had a CTA at that time to rule out dissection which was negative  He had a CT scan today which showed a partial small bowel obstruction due to an incarcerated inguinal hernia.  There is no evidence of bowel wall thickening or strangulation.  He does have a leukocytosis but he is also has some evidence of dehydration with an elevated creatinine along with an elevated hemoglobin and hematocrit compared to 6 days ago.   Past Medical History:  Diagnosis Date  . Abnormal blood chemistry test   . Actinic keratosis   . Arthritis   . AV block   . Blood transfusion without reported diagnosis   . Bradycardia   . Degeneration of lumbar or lumbosacral intervertebral disc   . Deviated nasal septum   . Diabetes mellitus without complication ([place])   . Gastroesophageal reflux disease without esophagitis   . Generalized anxiety disorder   . Gout with tophus   . Headache   . Hearing loss   . Hyperlipidemia   . Hypertension   . Insomnia   . Ischemia   . Murmur   . Nasal  turbinate hypertrophy   . Osteoarthritis of wrist   . PAC (premature atrial contraction)   . Peptic ulcer   . Peripheral neuropathy   . PVC (premature ventricular contraction)   . Seasonal allergic rhinitis due to pollen     Past Surgical History:  Procedure Laterality Date  . COLONOSCOPY    . ELBOW SURGERY     pinched nerve on both arms  . WRIST SURGERY     pinched nerve    Family History  Problem Relation Age of Onset  . Colon polyps Brother   . Hypertension Brother   . Hypertension Sister   . Colon cancer Neg Hx   . Esophageal cancer Neg Hx   . Rectal cancer Neg Hx   . Stomach cancer Neg Hx    Social History:  reports that he has quit smoking. He has never used smokeless tobacco. He reports that he does not drink alcohol or use drugs.  Allergies: No Known Allergies  (Not in a hospital admission)   Results for orders placed or performed during the hospital encounter of 10/30/18 (from the past 48 hour(s))  CBC with Differential     Status: Abnormal   Collection Time: 10/30/18  4:44 PM  Result Value Ref Range   WBC 16.6 (H) 4.0 - 10.5 K/uL   RBC 4.16 (L) 4.22 -  5.81 MIL/uL   Hemoglobin 12.3 (L) 13.0 - 17.0 g/dL   HCT 36.1 (L) 39.0 - 52.0 %   MCV 86.8 80.0 - 100.0 fL   MCH 29.6 26.0 - 34.0 pg   MCHC 34.1 30.0 - 36.0 g/dL   RDW 12.8 11.5 - 15.5 %   Platelets 192 150 - 400 K/uL   nRBC 0.0 0.0 - 0.2 %   Neutrophils Relative % 89 %   Neutro Abs 14.8 (H) 1.7 - 7.7 K/uL   Lymphocytes Relative 4 %   Lymphs Abs 0.6 (L) 0.7 - 4.0 K/uL   Monocytes Relative 6 %   Monocytes Absolute 1.0 0.1 - 1.0 K/uL   Eosinophils Relative 1 %   Eosinophils Absolute 0.1 0.0 - 0.5 K/uL   Basophils Relative 0 %   Basophils Absolute 0.0 0.0 - 0.1 K/uL   Immature Granulocytes 0 %   Abs Immature Granulocytes 0.07 0.00 - 0.07 K/uL    Comment: Performed at [facility] [address], [place], [place] [address]  Comprehensive metabolic panel     Status: Abnormal   Collection Time:  10/30/18  4:44 PM  Result Value Ref Range   Sodium 136 135 - 145 mmol/L   Potassium 3.8 3.5 - 5.1 mmol/L   Chloride 97 (L) 98 - 111 mmol/L   CO2 25 22 - 32 mmol/L   Glucose, Bld 147 (H) 70 - 99 mg/dL   BUN 37 (H) 8 - 23 mg/dL   Creatinine, Ser 1.37 (H) 0.61 - 1.24 mg/dL   Calcium 9.8 8.9 - 10.3 mg/dL   Total Protein 7.3 6.5 - 8.1 g/dL   Albumin 4.2 3.5 - 5.0 g/dL   AST 24 15 - 41 U/L   ALT 18 0 - 44 U/L   Alkaline Phosphatase 53 38 - 126 U/L   Total Bilirubin 1.2 0.3 - 1.2 mg/dL   GFR calc non Af Amer 49 (L) >60 mL/min   GFR calc Af Amer 56 (L) >60 mL/min   Anion gap 14 5 - 15    Comment: Performed at [facility] [address] [place], [place] [address]  Lipase, blood     Status: None   Collection Time: 10/30/18  4:44 PM  Result Value Ref Range   Lipase 25 11 - 51 U/L    Comment: Performed at [facility] [address], [place], [place] [address]  Urinalysis, Routine w reflex microscopic     Status: Abnormal   Collection Time: 10/30/18  4:54 PM  Result Value Ref Range   Color, Urine YELLOW YELLOW   APPearance CLEAR CLEAR   Specific Gravity, Urine 1.025 1.005 - 1.030   pH 5.5 5.0 - 8.0   Glucose, UA NEGATIVE NEGATIVE mg/dL   Hgb urine dipstick NEGATIVE NEGATIVE   Bilirubin Urine SMALL (A) NEGATIVE   Ketones, ur 15 (A) NEGATIVE mg/dL   Protein, ur >300 (A) NEGATIVE mg/dL   Nitrite NEGATIVE NEGATIVE   Leukocytes, UA NEGATIVE NEGATIVE    Comment: Performed at [facility] [address], [place], [place] [address]  Urinalysis, Microscopic (reflex)     Status: Abnormal   Collection Time: 10/30/18  4:54 PM  Result Value Ref Range   RBC / HPF 0-5 0 - 5 RBC/hpf   WBC, UA 6-10 0 - 5 WBC/hpf   Bacteria, UA RARE (A) NONE SEEN   Squamous Epithelial / LPF NONE SEEN 0 - 5   Mucus PRESENT  Comment: Performed at [facility], [place] [address] [place], [place] [address]   Ct Abdomen Pelvis W Contrast  Result Date: 10/30/2018 CLINICAL DATA:  Bowel  obstruction, high-grade, constipation, last bowel movement 4 days ago, history of diabetes mellitus, hypertension, arrhythmia is, former smoker, GERD EXAM: CT ABDOMEN AND PELVIS WITH CONTRAST TECHNIQUE: Multidetector CT imaging of the abdomen and pelvis was performed using the standard protocol following bolus administration of intravenous contrast. Sagittal and coronal MPR images reconstructed from axial data set. CONTRAST:  114mL OMNIPAQUE IOHEXOL 300 MG/ML SOLN IV. No oral contrast. COMPARISON:  None FINDINGS: Lower chest: Lung bases hyperinflated with minimal dependent atelectasis Hepatobiliary: Contracted gallbladder.  Liver normal appearance Pancreas: Atrophic pancreas Spleen: Normal appearance Adrenals/Urinary Tract: Adrenal glands normal appearance. Multiple BILATERAL renal cysts, largest posterior to LEFT kidney 7.6 x 5.1 cm. Intermediate attenuation exophytic nodule RIGHT kidney 14 x 10 mm question hyperdense or hemorrhagic cyst. No hydronephrosis or hydroureter. Bladder unremarkable. Stomach/Bowel: Stomach distended with fluid. Proximal and mid small bowel loops are distended by fluid with decompressed distal small bowel loops consistent with small bowel obstruction. Point of obstruction is at a small bowel loop within a RIGHT inguinal hernia. Colon decompressed. Normal appendix. Vascular/Lymphatic: Atherosclerotic calcifications aorta and iliac arteries. Few pelvic phleboliths. Minimal coronary arterial calcification. Aorta normal caliber. No adenopathy. Reproductive: Normal sized prostate gland Other: No free air or free fluid. No bowel wall thickening or acute inflammatory process. Musculoskeletal: Degenerative disc disease changes L5-S1 with a piece to no CIS of the spinal canal. IMPRESSION: Distal small-bowel obstruction secondary to herniation of of a distal small bowel loop into a RIGHT inguinal hernia. No evidence of bowel perforation or wall thickening. Multiple renal cysts with an additional  hyperdense 14 x 10 mm RIGHT renal nodule, potentially a hemorrhagic or complicated cyst, recommend follow-up non emergent ultrasound assessment following resolution of patient's bowel obstruction to characterize. Electronically Signed   By: Lavonia Dana M.D.   On: 10/30/2018 18:38    Review of Systems  Constitutional: Negative for weight loss.  HENT: Negative for nosebleeds.   Eyes: Negative for blurred vision.  Respiratory: Negative for shortness of breath.   Cardiovascular: Negative for chest pain, palpitations, orthopnea and PND.       Denies DOE  Gastrointestinal: Positive for abdominal pain, constipation, nausea and vomiting.  Genitourinary: Negative for dysuria and hematuria.  Musculoskeletal: Negative.   Skin: Negative for itching and rash.  Neurological: Negative for dizziness, focal weakness, seizures, loss of consciousness and headaches.       Denies TIAs, amaurosis fugax  Endo/Heme/Allergies: Does not bruise/bleed easily.  Psychiatric/Behavioral: The patient is not nervous/anxious.   All other systems reviewed and are negative.   Blood pressure 140/66, pulse 79, temperature 97.8 F (36.6 C), temperature source Oral, resp. rate 16, height 5\' 11"  (1.803 m), weight 88.5 kg, SpO2 98 %. Physical Exam  Vitals reviewed. Constitutional: He is oriented to person, place, and time. He appears well-developed and well-nourished. No distress.  HENT:  Head: Normocephalic and atraumatic.  Right Ear: External ear normal.  Left Ear: External ear normal.  Eyes: Conjunctivae are normal. No scleral icterus.  Neck: Normal range of motion. Neck supple. No tracheal deviation present. No thyromegaly present.  Cardiovascular: Normal rate and intact distal pulses.  Murmur heard.  Systolic murmur is present. Respiratory: Effort normal and breath sounds normal. No stridor. No respiratory distress. He has no wheezes.  GI: Soft. There is no abdominal tenderness. There is no rebound. A  hernia is  present. Hernia confirmed positive in the right inguinal area.  Genitourinary:       Genitourinary Comments: Bulge in Rt groin, tender, partially reducible. B/l testicles down   Musculoskeletal:        General: No tenderness or edema.  Lymphadenopathy:    He has no cervical adenopathy.  Neurological: He is alert and oriented to person, place, and time. He exhibits normal muscle tone.  Skin: Skin is warm and dry. No rash noted. He is not diaphoretic. No erythema. No pallor.  Psychiatric: He has a normal mood and affect. His behavior is normal. Judgment and thought content normal.     Assessment/Plan Incarcerated right inguinal hernia causing partial small bowel obstruction Elevated creatinine Hypertension Hyperlipidemia Diabetes mellitus History of PVCs and bradycardia Mild dehydration  I can partially reduce the inguinal hernia on exam. Since I am unable to fully reduce it I recommended proceeding to the operating room this evening.  Recommended open repair of incarcerated inguinal hernia with mesh possible diagnostic laparoscopy possible small bowel resection  I discussed this with the patient and his son.  I think the likelihood of having to do bowel resection is quite low in looking at the CT scan - no bowel wall thickening/pneumatosis.   We discussed the etiology of inguinal hernias. We discussed the signs & symptoms of incarceration & strangulation.   I described the procedure in detail.  We discussed the risks and benefits including but not limited to bleeding, infection, chronic inguinal pain, nerve entrapment, hernia recurrence, mesh complications, hematoma formation, urinary retention, injury to the testicle, numbness in the groin, blood clots, injury to the surrounding structures, and anesthesia risk. We also discussed the typical post operative recovery course, including no heavy lifting for 4-6 weeks. I explained that the likelihood of improvement of their symptoms is  good  IV abx on call SCDs  Right renal nodule - outpt follow up  Leighton Ruff. Redmond Pulling, MD, FACS General, Bariatric, & Minimally Invasive Surgery [facility], [place]  Greer Pickerel, MD 10/30/2018, 7:52 PM

## 2018-10-30 NOTE — ED Notes (Signed)
Patient transported to CT 

## 2018-10-30 NOTE — ED Provider Notes (Addendum)
Patient signed out to me from Dr. Billy Fischer.  77 year old male with constipation for 4 days and today had 10 episodes of vomiting.  Plan was to follow-up on blood work and CAT scan.  His CAT scan is significant for a SBO with an obstruction point at his right inguinal hernia.  I evaluated the patient he looks nontoxic and has no complaints of any vomiting or nausea currently.  He is got some mild abdominal distention on exam he does have a tender inguinal hernia that I was unable to reduce.  It was not particularly painful and the patient did not notice it until I started pushing on it. I may have reduced it part way but not completely.  I put a call into general surgery for evaluation.   Hayden Rasmussen, MD 10/30/18 1851  Clinical Course as of Oct 30 2000  Sat Oct 30, 2018  1939 Patient evaluated by Dr. Redmond Pulling and partially reduced but he would like to take him to the operating room for definitive repair.   [MB]    Clinical Course User Index [MB] Hayden Rasmussen, MD      Hayden Rasmussen, MD 10/30/18 2002

## 2018-10-30 NOTE — Op Note (Signed)
10/30/2018  10:30 PM  PATIENT:  Nicholas Shelton  77 y.o. male  PRE-OPERATIVE DIAGNOSIS:  INCARCERATED RIGHT INGUINAL HERNIA  POST-OPERATIVE DIAGNOSIS:  INCARCERATED INDIRECT RIGHT INGUINAL HERNIA  PROCEDURE:  Procedure(s): OPEN REPAIR OF INDIRECT INCARCERATED RIGHT INGUINAL HERNIA WITH MESH  SURGEON:  Surgeon(s): Greer Pickerel, MD  ASSISTANTS: none   ANESTHESIA:   general  DRAINS: none   LOCAL MEDICATIONS USED:  MARCAINE     SPECIMEN:  No Specimen  DISPOSITION OF SPECIMEN:  N/A  COUNTS:  YES  INDICATION FOR PROCEDURE: This is a 77 year old gentleman who came to the emergency room today with a chief complaint of constipation starting 4 days ago.  He tried over-the-counter medications without improvement.  He started having multiple episodes of emesis which prompted him to come to the emergency room.  In the emergency room he was found to have a mild bump in his creatinine and a CT scan found evidence of a partial small bowel obstruction due to an incarcerated right inguinal hernia.  I was able to partially reduce it in the emergency room.  There is no evidence of strangulation on imaging or on physical exam.  I recommended proceeding to the operating room for surgical intervention.  Please see H&P for additional details  PROCEDURE: After obtaining informed consent and marking the correct groin in the holding area with the patient confirming the operative site he was taken to the operating room and placed supine on the operating room table.  Anesthesia placed an NG tube prior to establishing general anesthesia and intubation.  They were able to evacuate 600 cc of gastric contents.  He was then intubated and general anesthesia was established.  Sequential compression devices were placed.  His abdomen and right groin was prepped and draped in the usual standard surgical fashion with ChloraPrep.  He received IV antibiotic prior to skin incision.  Surgical timeout was performed.  0.25% Marcaine  with epinephrine was used to anesthetize the skin over the mid-portion of the inguinal canal. An oblique incision was made. Dissection was carried down through the subcutaneous tissue with cautery to the external oblique fascia.  We opened the external oblique fascia along the direction of its fibers to the external ring.  There was some edema in the soft tissues.  The spermatic cord was circumferentially dissected bluntly and retracted with a Penrose drain.  The floor of the inguinal canal was inspected and there was no evidence of a direct defect.  He had a bulky spermatic cord.  There was no evidence of bowel still herniated.  It appeared that it had reduced spontaneously upon induction.  He had a chronically thickened indirect hernia sac which was intimately adhered to the cord structures.  He had a very bulky cord lipoma which was intimately associated with the testicular vessels.  I was able to isolate the ilioinguinal nerve and vas deferens.  I continue to try to dissect the cord lipoma away from the testicular vessels but it was intimately associated with it and I felt if I did any additional mobilization I would compromise the blood supply to the right testicle.  The indirect hernia sac was easily reduced back into the abdominal cavity.  We used a 3 x 6 inch piece of Ultrapro mesh, which was cut into a keyhole shape.  This was secured with 2-0 Prolene, beginning at the pubic tubercle, running this along the shelving edge inferiorly. Superiorly, the mesh was secured to the internal oblique fascia with interrupted 2-0 Prolene sutures.  The tails of the mesh were sutured together behind & in front of the spermatic cord to recreate the ring.  The mesh was tucked underneath the external oblique fascia laterally.  I irrigated the wound with antibiotic irrigation.  Local was infiltrated in a regional fashion.  The external oblique fascia was reapproximated with 2-0 Vicryl.  I irrigated the soft tissue with  antibiotic irrigation.  3-0 Vicryl was used to close the subcutaneous tissues and 4-0 Monocryl was used to close the skin in subcuticular fashion.  Dermabond was used to seal the incision.  A clean dressing was applied.  The patient was then extubated and brought to the recovery room in stable condition.  All sponge, instrument, and needle counts were correct prior to closure and at the conclusion of the case.   PLAN OF CARE: Admit to inpatient   PATIENT DISPOSITION:  PACU - hemodynamically stable.   Delay start of Pharmacological VTE agent (>24hrs) due to surgical blood loss or risk of bleeding:  no  Leighton Ruff. Redmond Pulling, MD, FACS General, Bariatric, & Minimally Invasive Surgery [facility], [place]

## 2018-10-30 NOTE — Discharge Instructions (Addendum)
Recommend activity, hydration, high fiber diet and miralax taper (4-6 caps in 1 32oz bottle day 1, 3 caps day 2, 2 caps day 3), and taking the senokot prescribed.   [facility] _______[facility]  UMBILICAL OR INGUINAL HERNIA REPAIR: POST OP INSTRUCTIONS  Always review your discharge instruction sheet given to you by the facility where your surgery was performed. IF YOU HAVE DISABILITY OR FAMILY LEAVE FORMS, YOU MUST BRING THEM TO THE OFFICE FOR PROCESSING.   DO NOT GIVE THEM TO YOUR DOCTOR.  1. A  prescription for pain medication may be given to you upon discharge.  Take your pain medication as prescribed, if needed.  If narcotic pain medicine is not needed, then you may take acetaminophen (Tylenol) or ibuprofen (Advil) as needed. 2. Take your usually prescribed medications unless otherwise directed. If you need a refill on your pain medication, please contact your pharmacy.  They will contact our office to request authorization. Prescriptions will not be filled after 5 pm or on week-ends. 3. You should follow a light diet the first 24 hours after arrival home, such as soup and crackers, etc.  Be sure to include lots of fluids daily.  Resume your normal diet the day after surgery. 4.Most patients will experience some swelling and bruising around the umbilicus or in the groin and scrotum.  Ice packs and reclining will help.  Swelling and bruising can take several days to resolve.  6. It is common to experience some constipation if taking pain medication after surgery.  Increasing fluid intake and taking a stool softener (such as Colace) will usually help or prevent this problem from occurring.  A mild laxative (Milk of Magnesia or Miralax) should be taken according to package directions if there are no bowel movements after 48 hours. 7. Unless discharge instructions indicate otherwise, you may remove your bandages 24-48 hours after surgery, and you may shower at that time.  You may have  steri-strips (small skin tapes) in place directly over the incision.  These strips should be left on the skin for 7-10 days.  If your surgeon used skin glue on the incision, you may shower in 24 hours.  The glue will flake off over the next 2-3 weeks.  Any sutures or staples will be removed at the office during your follow-up visit. 8. ACTIVITIES:  You may resume regular (light) daily activities beginning the next day--such as daily self-care, walking, climbing stairs--gradually increasing activities as tolerated.  You may have sexual intercourse when it is comfortable.  Refrain from any heavy lifting or straining until approved by your doctor.  a.You may drive when you are no longer taking prescription pain medication, you can comfortably wear a seatbelt, and you can safely maneuver your car and apply brakes. b.RETURN TO WORK:   _____________________________________________  9.You should see your doctor in the office for a follow-up appointment approximately 2-3 weeks after your surgery.  Make sure that you call for this appointment within a day or two after you arrive home to insure a convenient appointment time. 10.OTHER INSTRUCTIONS: _________________________    _____________________________________  WHEN TO CALL YOUR DOCTOR: 1. Fever over 101.0 2. Inability to urinate 3. Nausea and/or vomiting 4. Extreme swelling or bruising 5. Continued bleeding from incision. 6. Increased pain, redness, or drainage from the incision  The clinic staff is available to answer your questions during regular business hours.  Please dont hesitate to call and ask to speak to one of the nurses for clinical concerns.  If you have a medical emergency, go to the nearest emergency room or call 911.  A surgeon from [facility] is always on call at the hospital   [address], [place], [place], [place]  [address] ?  [address], [place],    [address] [phone] ? [phone] ? FAX  [phone] Web site: [email or web address]   GETTING TO GOOD BOWEL HEALTH. Irregular bowel habits such as constipation and diarrhea can lead to many problems over time.  Having one soft bowel movement a day is the most important way to prevent further problems.  The anorectal canal is designed to handle stretching and feces to safely manage our ability to get rid of solid waste (feces, poop, stool) out of our body.  BUT, hard constipated stools can act like ripping concrete bricks and diarrhea can be a burning fire to this very sensitive area of our body, causing inflamed hemorrhoids, anal fissures, increasing risk is perirectal abscesses, abdominal pain/bloating, an making irritable bowel worse.     The goal: ONE SOFT BOWEL MOVEMENT A DAY!  To have soft, regular bowel movements:   Drink at least 8 tall glasses of water a day.    Take plenty of fiber.  Fiber is the undigested part of plant food that passes into the colon, acting s natures broom to encourage bowel motility and movement.  Fiber can absorb and hold large amounts of water. This results in a larger, bulkier stool, which is soft and easier to pass. Work gradually over several weeks up to 6 servings a day of fiber (25g a day even more if needed) in the form of: o Vegetables -- Root (potatoes, carrots, turnips), leafy green (lettuce, salad greens, celery, spinach), or cooked high residue (cabbage, broccoli, etc) o Fruit -- Fresh (unpeeled skin & pulp), Dried (prunes, apricots, cherries, etc ),  or stewed ( applesauce)  o Whole grain breads, pasta, etc (whole wheat)  o Bran cereals   Bulking Agents -- This type of water-retaining fiber generally is easily obtained each day by one of the following:  o Psyllium bran -- The psyllium plant is remarkable because its ground seeds can retain so much water. This product is available as Metamucil, Konsyl, Effersyllium, Per Diem Fiber, or the less expensive generic preparation in drug  and health food stores. Although labeled a laxative, it really is not a laxative.  o Methylcellulose -- This is another fiber derived from wood which also retains water. It is available as Citrucel. o Polyethylene Glycol - and artificial fiber commonly called Miralax or Glycolax.  It is helpful for people with gassy or bloated feelings with regular fiber o Flax Seed - a less gassy fiber than psyllium  No reading or other relaxing activity while on the toilet. If bowel movements take longer than 5 minutes, you are too constipated  AVOID CONSTIPATION.  High fiber and water intake usually takes care of this.  Sometimes a laxative is needed to stimulate more frequent bowel movements, but   Laxatives are not a good long-term solution as it can wear the colon out. o Osmotics (Milk of Magnesia, Fleets phosphosoda, Magnesium citrate, MiraLax, GoLytely) are safer than  o Stimulants (Senokot, Castor Oil, Dulcolax, Ex Lax)    o Do not take laxatives for more than 7days in a row.   IF SEVERELY CONSTIPATED, try a Bowel Retraining Program: o Do not use laxatives.  o Eat a diet high in roughage, such as bran cereals and leafy  vegetables.  o Drink six (6) ounces of prune or apricot juice each morning.  o Eat two (2) large servings of stewed fruit each day.  o Take one (1) heaping tablespoon of a psyllium-based bulking agent twice a day. Use sugar-free sweetener when possible to avoid excessive calories.  o Eat a normal breakfast.  o Set aside 15 minutes after breakfast to sit on the toilet, but do not strain to have a bowel movement.  o If you do not have a bowel movement by the third day, use an enema and repeat the above steps.   Controlling diarrhea o Switch to liquids and simpler foods for a few days to avoid stressing your intestines further. o Avoid dairy products (especially milk & ice cream) for a short time.  The intestines often can lose the ability to digest lactose when stressed. o Avoid  foods that cause gassiness or bloating.  Typical foods include beans and other legumes, cabbage, broccoli, and dairy foods.  Every person has some sensitivity to other foods, so listen to our body and avoid those foods that trigger problems for you. o Adding fiber (Citrucel, Metamucil, psyllium, Miralax) gradually can help thicken stools by absorbing excess fluid and retrain the intestines to act more normally.  Slowly increase the dose over a few weeks.  Too much fiber too soon can backfire and cause cramping & bloating. o Probiotics (such as active yogurt, Align, etc) may help repopulate the intestines and colon with normal bacteria and calm down a sensitive digestive tract.  Most studies show it to be of mild help, though, and such products can be costly. o Medicines: - Bismuth subsalicylate (ex. Kayopectate, Pepto Bismol) every 30 minutes for up to 6 doses can help control diarrhea.  Avoid if pregnant. - Loperamide (Immodium) can slow down diarrhea.  Start with two tablets (4mg  total) first and then try one tablet every 6 hours.  Avoid if you are having fevers or severe pain.  If you are not better or start feeling worse, stop all medicines and call your doctor for advice o Call your doctor if you are getting worse or not better.  Sometimes further testing (cultures, endoscopy, X-ray studies, bloodwork, etc) may be needed to help diagnose and treat the cause of the diarrhea.

## 2018-10-30 NOTE — ED Provider Notes (Signed)
[facility] EMERGENCY DEPARTMENT Provider Note   CSN: [id number] Arrival date & time: 10/30/18  1333     History   Chief Complaint Chief Complaint  Patient presents with  . Constipation    HPI Nicholas Shelton is a 77 y.o. male.  HPI   77 year old male with a history below presents with concern for constipation.  Reports that he has been constipated for 4 days, is unable to unable to have a bowel movement.  Reports he took a dose of his daughters Amitiza x2 and after that developed episodes of vomiting.  Initially denies any abdominal pain, however on reevaluation, does report he has intermittent abdominal pain.  Reports he is passing flatus.  Denies fevers.  Denies dysuria.  Has no history of intra-abdominal surgery.  Reports he had 8-10 episodes of emesis today.   Past Medical History:  Diagnosis Date  . Abnormal blood chemistry test   . Actinic keratosis   . Arthritis   . AV block   . Blood transfusion without reported diagnosis   . Bradycardia   . Degeneration of lumbar or lumbosacral intervertebral disc   . Deviated nasal septum   . Diabetes mellitus without complication ([place])   . Gastroesophageal reflux disease without esophagitis   . Generalized anxiety disorder   . Gout with tophus   . Headache   . Hearing loss   . Hyperlipidemia   . Hypertension   . Insomnia   . Ischemia   . Murmur   . Nasal turbinate hypertrophy   . Osteoarthritis of wrist   . PAC (premature atrial contraction)   . Peptic ulcer   . Peripheral neuropathy   . PVC (premature ventricular contraction)   . Seasonal allergic rhinitis due to pollen     There are no active problems to display for this patient.   Past Surgical History:  Procedure Laterality Date  . COLONOSCOPY    . ELBOW SURGERY     pinched nerve on both arms  . WRIST SURGERY     pinched nerve        Home Medications    Prior to Admission medications   Medication Sig Start Date End Date Taking?  Authorizing Provider  acetaminophen (TYLENOL) 325 MG tablet Take 325 mg by mouth every 6 (six) hours as needed for headache (pain).   Yes [provider]  allopurinol (ZYLOPRIM) 300 MG tablet Take 300 mg by mouth daily.   Yes [provider]  amLODipine (NORVASC) 10 MG tablet Take 10 mg by mouth daily.   Yes [provider]  atorvastatin (LIPITOR) 20 MG tablet Take 1 tablet (20 mg total) by mouth 2 (two) times a week. Patient taking differently: Take 20 mg by mouth See admin instructions. Take one tablet (20 mg) by mouth twice weekly - Monday and Thursday 11/09/17  Yes Deboraha Sprang, MD  carvedilol (COREG) 3.125 MG tablet Take 1 tablet (3.125 mg total) by mouth 2 (two) times daily with a meal. 11/06/17  Yes Deboraha Sprang, MD  cloNIDine (CATAPRES) 0.3 MG tablet Take 0.6 mg by mouth 2 (two) times daily.    Yes [provider]  hydrochlorothiazide (HYDRODIURIL) 25 MG tablet Take 25 mg by mouth daily.   Yes [provider]  lisinopril (PRINIVIL,ZESTRIL) 40 MG tablet Take 40 mg by mouth daily.   Yes [provider]  metFORMIN (GLUCOPHAGE) 1000 MG tablet Take 1,000 mg by mouth 2 (two) times daily with a meal.   Yes  [provider]  pantoprazole (PROTONIX) 40 MG tablet Take 40 mg by mouth daily.   Yes [provider]  Tetrahydrozoline HCl (VISINE OP) Place 1 drop into the left eye daily as needed (irritation/dry eyes).   Yes [provider]  lactulose (CHRONULAC) 10 GM/15ML solution Take 45 mLs (30 g total) by mouth daily as needed for severe constipation. 10/30/18   Gareth Morgan, MD  senna-docusate (SENOKOT-S) 8.6-50 MG tablet Take 1 tablet by mouth 2 (two) times daily as needed for mild constipation. 10/30/18   Gareth Morgan, MD    Family History Family History  Problem Relation Age of Onset  . Colon polyps Brother   . Hypertension Brother   . Hypertension Sister   . Colon cancer Neg Hx   . Esophageal  cancer Neg Hx   . Rectal cancer Neg Hx   . Stomach cancer Neg Hx     Social History Social History   Tobacco Use  . Smoking status: Former Research scientist (life sciences)  . Smokeless tobacco: Never Used  Substance Use Topics  . Alcohol use: No  . Drug use: No     Allergies   Patient has no known allergies.   Review of Systems Review of Systems  Constitutional: Negative for fever.  HENT: Negative for sore throat.   Eyes: Negative for visual disturbance.  Respiratory: Negative for shortness of breath.   Cardiovascular: Negative for chest pain.  Gastrointestinal: Positive for abdominal pain, constipation, nausea and vomiting.  Genitourinary: Negative for difficulty urinating.  Musculoskeletal: Negative for back pain.  Skin: Negative for rash.  Neurological: Negative for syncope and headaches.     Physical Exam Updated Vital Signs BP (!) 168/71 (BP Location: Right Arm)   Pulse 74   Temp 97.8 F (36.6 C) (Oral)   Resp 16   Ht 5\' 11"  (1.803 m)   Wt 88.5 kg   SpO2 99%   BMI 27.20 kg/m   Physical Exam Vitals signs and nursing note reviewed.  Constitutional:      General: He is not in acute distress.    Appearance: He is well-developed. He is not diaphoretic.  HENT:     Head: Normocephalic and atraumatic.  Eyes:     Conjunctiva/sclera: Conjunctivae normal.  Neck:     Musculoskeletal: Normal range of motion.  Cardiovascular:     Rate and Rhythm: Normal rate and regular rhythm.     Heart sounds: Normal heart sounds. No murmur. No friction rub. No gallop.   Pulmonary:     Effort: Pulmonary effort is normal. No respiratory distress.     Breath sounds: Normal breath sounds. No wheezing or rales.  Abdominal:     General: There is no distension.     Palpations: Abdomen is soft.     Tenderness: There is no abdominal tenderness. There is no guarding.  Skin:    General: Skin is warm and dry.  Neurological:     Mental Status: He is alert and oriented to person, place, and time.       ED Treatments / Results  Labs (all labs ordered are listed, but only abnormal results are displayed) Labs Reviewed  URINE CULTURE  CBC WITH DIFFERENTIAL/PLATELET  COMPREHENSIVE METABOLIC PANEL  LIPASE, BLOOD  URINALYSIS, ROUTINE W REFLEX MICROSCOPIC    EKG None  Radiology No results found.  Procedures Procedures (including critical care time)  Medications Ordered in ED Medications  sodium chloride 0.9 % bolus 1,000 mL (has no administration in time range)  ondansetron (ZOFRAN)  injection 4 mg (has no administration in time range)     Initial Impression / Assessment and Plan / ED Course  I have reviewed the triage vital signs and the nursing notes.  Pertinent labs & imaging results that were available during my care of the patient were reviewed by me and considered in my medical decision making (see chart for details).     77 year old male with a history above presents with concern for constipation.  Patient initially denies abdominal pain, reports nausea and vomiting associated with taking medication, however on reevaluation, reports episodes of abdominal pain and 8-10 episodes of emesis.  Rectal exam performed showing no signs of impaction.  Will order CBC, CMP, lipase, and CT abdomen pelvis to evaluate for signs of small bowel obstruction, or other acute intra-abdominal pathology.  If these are negative, would recommend symptomatic treatment with medications for constipation.    Final Clinical Impressions(s) / ED Diagnoses   Final diagnoses:  Nausea and vomiting, intractability of vomiting not specified, unspecified vomiting type  Constipation, unspecified constipation type    ED Discharge Orders         Ordered    senna-docusate (SENOKOT-S) 8.6-50 MG tablet  2 times daily PRN     10/30/18 1641    lactulose (CHRONULAC) 10 GM/15ML solution  Daily PRN     10/30/18 1641           Gareth Morgan, MD 10/30/18 1650

## 2018-10-30 NOTE — Anesthesia Procedure Notes (Signed)
Procedure Name: Intubation Date/Time: 10/30/2018 8:25 PM Performed by: Clovis Cao, CRNA Pre-anesthesia Checklist: Patient identified, Emergency Drugs available, Suction available, Patient being monitored and Timeout performed Patient Re-evaluated:Patient Re-evaluated prior to induction Oxygen Delivery Method: Circle system utilized Preoxygenation: Pre-oxygenation with 100% oxygen (NG placed prior to induction) Induction Type: IV induction, Rapid sequence and Cricoid Pressure applied Laryngoscope Size: Miller and 2 Grade View: Grade I Tube type: Oral Tube size: 7.5 mm Number of attempts: 1 Airway Equipment and Method: Stylet Placement Confirmation: ETT inserted through vocal cords under direct vision,  positive ETCO2 and breath sounds checked- equal and bilateral Secured at: 22 cm Tube secured with: Tape Dental Injury: Teeth and Oropharynx as per pre-operative assessment

## 2018-10-30 NOTE — Anesthesia Preprocedure Evaluation (Signed)
Anesthesia Evaluation  Patient identified by MRN, date of birth, ID band Patient awake    Reviewed: Allergy & Precautions, NPO status , Patient's Chart, lab work & pertinent test results  Airway Mallampati: II  TM Distance: >3 FB Neck ROM: Full    Dental  (+) Edentulous Upper, Edentulous Lower   Pulmonary former smoker,    breath sounds clear to auscultation       Cardiovascular hypertension,  Rhythm:Regular Rate:Normal     Neuro/Psych    GI/Hepatic   Endo/Other  diabetes  Renal/GU      Musculoskeletal   Abdominal   Peds  Hematology   Anesthesia Other Findings   Reproductive/Obstetrics                             Anesthesia Physical Anesthesia Plan  ASA: III and emergent  Anesthesia Plan: General   Post-op Pain Management:    Induction: Intravenous, Rapid sequence and Cricoid pressure planned  PONV Risk Score and Plan:   Airway Management Planned: Oral ETT  Additional Equipment:   Intra-op Plan:   Post-operative Plan: Extubation in OR  Informed Consent: I have reviewed the patients History and Physical, chart, labs and discussed the procedure including the risks, benefits and alternatives for the proposed anesthesia with the patient or authorized representative who has indicated his/her understanding and acceptance.     Plan Discussed with: CRNA and Anesthesiologist  Anesthesia Plan Comments:         Anesthesia Quick Evaluation

## 2018-10-30 NOTE — Anesthesia Postprocedure Evaluation (Signed)
Anesthesia Post Note  Patient: Chief Financial Officer  Procedure(s) Performed: OPEN REPAIR OF INCARCERATED RIGHT INGUINAL HERNIA WITH MESH (Right Abdomen)     Patient location during evaluation: PACU Anesthesia Type: General Level of consciousness: awake and alert Pain management: pain level controlled Vital Signs Assessment: post-procedure vital signs reviewed and stable Respiratory status: spontaneous breathing, nonlabored ventilation, respiratory function stable and patient connected to nasal cannula oxygen Cardiovascular status: blood pressure returned to baseline and stable Postop Assessment: no apparent nausea or vomiting Anesthetic complications: no    Last Vitals:  Vitals:   10/30/18 2230 10/30/18 2240  BP: (!) 164/57 (!) 142/50  Pulse: 79 75  Resp: 16 (!) 21  Temp: 36.7 C   SpO2: 95% 97%    Last Pain:  Vitals:   10/30/18 2230  TempSrc:   PainSc: 0-No pain                 Kester Stimpson COKER

## 2018-10-30 NOTE — Transfer of Care (Signed)
Immediate Anesthesia Transfer of Care Note  Patient: Nicholas Shelton  Procedure(s) Performed: OPEN REPAIR OF INCARCERATED RIGHT INGUINAL HERNIA WITH MESH (Right Abdomen)  Patient Location: PACU  Anesthesia Type:General  Level of Consciousness: awake  Airway & Oxygen Therapy: Patient Spontanous Breathing  Post-op Assessment: Report given to RN and Post -op Vital signs reviewed and stable  Post vital signs: Reviewed and stable  Last Vitals:  Vitals Value Taken Time  BP 142/50 10/30/2018 10:40 PM  Temp    Pulse 72 10/30/2018 10:44 PM  Resp 15 10/30/2018 10:44 PM  SpO2 97 % 10/30/2018 10:44 PM  Vitals shown include unvalidated device data.  Last Pain:  Vitals:   10/30/18 2230  TempSrc:   PainSc: 0-No pain         Complications: No apparent anesthesia complications

## 2018-10-30 NOTE — ED Triage Notes (Signed)
Pt arrives to ED with complaints of constipation, states last BM was Tuesday (4 days ago). Pt has taken multiple OTC medicines including milk of magnesia and pepto bismol without relief. Abdomen distended, bowel sounds active in all 4 quadrants. Pt placed in position of comfort with call bell in reach, bed locked and lowered.

## 2018-10-31 LAB — URINE CULTURE: Culture: NO GROWTH

## 2018-10-31 LAB — BASIC METABOLIC PANEL
Anion gap: 11 (ref 5–15)
BUN: 26 mg/dL — ABNORMAL HIGH (ref 8–23)
CO2: 25 mmol/L (ref 22–32)
Calcium: 8.4 mg/dL — ABNORMAL LOW (ref 8.9–10.3)
Chloride: 103 mmol/L (ref 98–111)
Creatinine, Ser: 1.19 mg/dL (ref 0.61–1.24)
GFR calc Af Amer: >60 mL/min (ref >60)
GFR calc non Af Amer: 58 mL/min — ABNORMAL LOW (ref >60)
Glucose, Bld: 117 mg/dL — ABNORMAL HIGH (ref 70–99)
Potassium: 3.7 mmol/L (ref 3.5–5.1)
Sodium: 139 mmol/L (ref 135–145)

## 2018-10-31 LAB — CBC
HCT: 32.6 % — ABNORMAL LOW (ref 39.0–52.0)
Hemoglobin: 10.9 g/dL — ABNORMAL LOW (ref 13.0–17.0)
MCH: 29.6 pg (ref 26.0–34.0)
MCHC: 33.4 g/dL (ref 30.0–36.0)
MCV: 88.6 fL (ref 80.0–100.0)
Platelets: 163 10*3/uL (ref 150–400)
RBC: 3.68 MIL/uL — ABNORMAL LOW (ref 4.22–5.81)
RDW: 13 % (ref 11.5–15.5)
WBC: 12 10*3/uL — ABNORMAL HIGH (ref 4.0–10.5)
nRBC: 0 % (ref 0.0–0.2)

## 2018-10-31 LAB — GLUCOSE, CAPILLARY
GLUCOSE-CAPILLARY: 128 mg/dL — AB (ref 70–99)
Glucose-Capillary: 124 mg/dL — ABNORMAL HIGH (ref 70–99)
Glucose-Capillary: 155 mg/dL — ABNORMAL HIGH (ref 70–99)
Glucose-Capillary: 159 mg/dL — ABNORMAL HIGH (ref 70–99)
Glucose-Capillary: 88 mg/dL (ref 70–99)
Glucose-Capillary: 98 mg/dL (ref 70–99)

## 2018-10-31 MED ORDER — POLYETHYLENE GLYCOL 3350 17 G PO PACK: 17.0000 g | PACK | Freq: Every day | ORAL | Status: DC | PRN

## 2018-10-31 MED ORDER — DOCUSATE SODIUM 100 MG PO CAPS
100.0000 mg | ORAL_CAPSULE | Freq: Two times a day (BID) | ORAL | Status: DC
  Administered 2018-10-31 (×2): 100 mg via ORAL
  Filled 2018-10-31 (×2): qty 1

## 2018-10-31 MED ORDER — POLYETHYLENE GLYCOL 3350 17 G PO PACK
17.0000 g | PACK | Freq: Every day | ORAL | Status: DC
  Administered 2018-10-31: 17 g via ORAL
  Filled 2018-10-31: qty 1

## 2018-10-31 MED ORDER — MORPHINE SULFATE (PF) 2 MG/ML IV SOLN
1.0000 mg | INTRAVENOUS | Status: DC | PRN
  Administered 2018-11-01: 2 mg via INTRAVENOUS
  Filled 2018-10-31: qty 1

## 2018-10-31 MED ORDER — OXYCODONE HCL 5 MG PO TABS
5.0000 mg | ORAL_TABLET | ORAL | Status: DC | PRN
  Administered 2018-11-01: 5 mg via ORAL
  Filled 2018-10-31: qty 1

## 2018-10-31 NOTE — Evaluation (Signed)
Physical Therapy Evaluation Patient Details Name: Nicholas Shelton MRN: [id number] DOB: 12/24/1940 Today's Date: 10/31/2018   History of Present Illness  Patient is a 77 y/o male who presents with constipation and emesis. Found to have partial SBO with obstruction of inguinal hernia s/p repair with mesh. PMH includes HTN, HLD, PVC, PACs, DM.  Clinical Impression  Patient tolerated gait training and stair training with supervision-Mod I for safety. Pt independent PTA and cares for wife at home. Works out every morning for 1.5 hrs in his home gym. Requires cues for safety with stair negotiation due to hx of neuropathy but no evidence of imbalance during gait. Encouraged walking a few times per day. Does not require skilled therapy services as pt functioning close to baseline. All education completed. Discharge from therapy.    Follow Up Recommendations No PT follow up;Supervision - Intermittent    Equipment Recommendations  None recommended by PT    Recommendations for Other Services       Precautions / Restrictions Precautions Precautions: None Restrictions Weight Bearing Restrictions: No      Mobility  Bed Mobility Overal bed mobility: Needs Assistance Bed Mobility: Sit to Supine       Sit to supine: Independent   General bed mobility comments: No assist needed.   Transfers Overall transfer level: Modified independent                  Ambulation/Gait Ambulation/Gait assistance: Modified independent (Device/Increase time) Gait Distance (Feet): 200 Feet Assistive device: None Gait Pattern/deviations: Step-through pattern;Decreased stride length;Antalgic   Gait velocity interpretation: >2.62 ft/sec, indicative of community ambulatory General Gait Details: Antaglic gait at times due to chronic right knee issues, but no evidence of imbalance.  Stairs Stairs: Yes Stairs assistance: Supervision Stair Management: One rail Left;Step to pattern;Alternating pattern Number  of Stairs: 4 General stair comments: Cues for safety. Pt hitting top of foot on step on right when ascending, needing cues to slow down.  Wheelchair Mobility    Modified Rankin (Stroke Patients Only)       Balance Overall balance assessment: Needs assistance Sitting-balance support: No upper extremity supported;Feet supported Sitting balance-Leahy Scale: Good     Standing balance support: During functional activity Standing balance-Leahy Scale: Good                               Pertinent Vitals/Pain Pain Assessment: No/denies pain    Home Living Family/patient expects to be discharged to:: Private residence Living Arrangements: Spouse/significant other Available Help at Discharge: Family;Available 24 hours/day Type of Home: House Home Access: Stairs to enter Entrance Stairs-Rails: Right;Left;Can reach both Entrance Stairs-Number of Steps: 6 Home Layout: One level Home Equipment: Walker - 2 wheels      Prior Function Level of Independence: Independent         Comments: Drives. Helps care for wife. Works out everyday in his home gym.      Hand Dominance   Dominant Hand: Right    Extremity/Trunk Assessment   Upper Extremity Assessment Upper Extremity Assessment: Defer to OT evaluation    Lower Extremity Assessment Lower Extremity Assessment: Overall WFL for tasks assessed(premorbid right knee issues)       Communication   Communication: HOH  Cognition Arousal/Alertness: Awake/alert Behavior During Therapy: WFL for tasks assessed/performed Overall Cognitive Status: Within Functional Limits for tasks assessed  General Comments      Exercises     Assessment/Plan    PT Assessment Patent does not need any further PT services  PT Problem List         PT Treatment Interventions      PT Goals (Current goals can be found in the Care Plan section)  Acute Rehab PT Goals Patient  Stated Goal: to get well and go home PT Goal Formulation: All assessment and education complete, DC therapy    Frequency     Barriers to discharge        Co-evaluation               AM-PAC PT "6 Clicks" Mobility  Outcome Measure Help needed turning from your back to your side while in a flat bed without using bedrails?: None Help needed moving from lying on your back to sitting on the side of a flat bed without using bedrails?: None Help needed moving to and from a bed to a chair (including a wheelchair)?: None Help needed standing up from a chair using your arms (e.g., wheelchair or bedside chair)?: None Help needed to walk in hospital room?: A Little Help needed climbing 3-5 steps with a railing? : A Little 6 Click Score: 22    End of Session Equipment Utilized During Treatment: Gait belt Activity Tolerance: Patient tolerated treatment well Patient left: in bed;with call bell/phone within reach Nurse Communication: Mobility status PT Visit Diagnosis: Difficulty in walking, not elsewhere classified (R26.2)    Time: 0177-9390 PT Time Calculation (min) (ACUTE ONLY): 14 min   Charges:   PT Evaluation $PT Eval Low Complexity: 1 Low          Wray Kearns, PT, DPT Acute Rehabilitation Services Pager [phone] Office [phone]      Marguarite Arbour A Sabra Heck 10/31/2018, 1:46 PM

## 2018-10-31 NOTE — Progress Notes (Signed)
[facility] Progress Note  1 Day Post-Op  Subjective: CC-  Patient states that he is feeling much better since surgery. Denies any current abdominal pain. No n/v. Passing flatus. No BM.  Has not gotten OOB to ambulate yet.  Objective: Vital signs in last 24 hours: Temp:  [97.8 F (36.6 C)-99.1 F (37.3 C)] 98.2 F (36.8 C) (12/22 0433) Pulse Rate:  [63-80] 67 (12/22 0433) Resp:  [15-21] 19 (12/22 0433) BP: (133-170)/(50-79) 147/65 (12/22 0433) SpO2:  [91 %-100 %] 96 % (12/22 0433) Weight:  [88.5 kg] 88.5 kg (12/21 1350) Last BM Date: 10/26/18  Intake/Output from previous day: 12/21 0701 - 12/22 0700 In: 3600 [P.O.:100; I.V.:2500; IV Piggyback:1000] Out: 1450 [Urine:700; Blood:50] Intake/Output this shift: No intake/output data recorded.  PE: Gen:  Alert, NAD, pleasant HEENT: EOM's intact, pupils equal and round Pulm:  effort normal Abd: Soft, NT/ND, +BS, no HSM, R groin incision cdi Psych: A&Ox3  Skin: no rashes noted, warm and dry  Lab Results:  Recent Labs    10/30/18 1644 10/31/18 0218  WBC 16.6* 12.0*  HGB 12.3* 10.9*  HCT 36.1* 32.6*  PLT 192 163   BMET Recent Labs    10/30/18 1644 10/31/18 0218  NA 136 139  K 3.8 3.7  CL 97* 103  CO2 25 25  GLUCOSE 147* 117*  BUN 37* 26*  CREATININE 1.37* 1.19  CALCIUM 9.8 8.4*   PT/INR No results for input(s): LABPROT, INR in the last 72 hours. CMP     Component Value Date/Time   NA 139 10/31/2018 0218   K 3.7 10/31/2018 0218   CL 103 10/31/2018 0218   CO2 25 10/31/2018 0218   GLUCOSE 117 (H) 10/31/2018 0218   BUN 26 (H) 10/31/2018 0218   CREATININE 1.19 10/31/2018 0218   CALCIUM 8.4 (L) 10/31/2018 0218   PROT 7.3 10/30/2018 1644   ALBUMIN 4.2 10/30/2018 1644   AST 24 10/30/2018 1644   ALT 18 10/30/2018 1644   ALKPHOS 53 10/30/2018 1644   BILITOT 1.2 10/30/2018 1644   GFRNONAA 58 (L) 10/31/2018 0218   GFRAA >60 10/31/2018 0218   Lipase     Component Value Date/Time   LIPASE 25  10/30/2018 1644       Studies/Results: Ct Abdomen Pelvis W Contrast  Result Date: 10/30/2018 CLINICAL DATA:  Bowel obstruction, high-grade, constipation, last bowel movement 4 days ago, history of diabetes mellitus, hypertension, arrhythmia is, former smoker, GERD EXAM: CT ABDOMEN AND PELVIS WITH CONTRAST TECHNIQUE: Multidetector CT imaging of the abdomen and pelvis was performed using the standard protocol following bolus administration of intravenous contrast. Sagittal and coronal MPR images reconstructed from axial data set. CONTRAST:  124mL OMNIPAQUE IOHEXOL 300 MG/ML SOLN IV. No oral contrast. COMPARISON:  None FINDINGS: Lower chest: Lung bases hyperinflated with minimal dependent atelectasis Hepatobiliary: Contracted gallbladder.  Liver normal appearance Pancreas: Atrophic pancreas Spleen: Normal appearance Adrenals/Urinary Tract: Adrenal glands normal appearance. Multiple BILATERAL renal cysts, largest posterior to LEFT kidney 7.6 x 5.1 cm. Intermediate attenuation exophytic nodule RIGHT kidney 14 x 10 mm question hyperdense or hemorrhagic cyst. No hydronephrosis or hydroureter. Bladder unremarkable. Stomach/Bowel: Stomach distended with fluid. Proximal and mid small bowel loops are distended by fluid with decompressed distal small bowel loops consistent with small bowel obstruction. Point of obstruction is at a small bowel loop within a RIGHT inguinal hernia. Colon decompressed. Normal appendix. Vascular/Lymphatic: Atherosclerotic calcifications aorta and iliac arteries. Few pelvic phleboliths. Minimal coronary arterial calcification. Aorta normal caliber. No adenopathy. Reproductive:  Normal sized prostate gland Other: No free air or free fluid. No bowel wall thickening or acute inflammatory process. Musculoskeletal: Degenerative disc disease changes L5-S1 with a piece to no CIS of the spinal canal. IMPRESSION: Distal small-bowel obstruction secondary to herniation of of a distal small bowel loop  into a RIGHT inguinal hernia. No evidence of bowel perforation or wall thickening. Multiple renal cysts with an additional hyperdense 14 x 10 mm RIGHT renal nodule, potentially a hemorrhagic or complicated cyst, recommend follow-up non emergent ultrasound assessment following resolution of patient's bowel obstruction to characterize. Electronically Signed   By: Lavonia Dana M.D.   On: 10/30/2018 18:38    Anti-infectives: Anti-infectives (From admission, onward)   Start     Dose/Rate Route Frequency Ordered Stop   10/30/18 2120  polymyxin B 500,000 Units, bacitracin 50,000 Units in sodium chloride 0.9 % 500 mL irrigation  Status:  Discontinued       As needed 10/30/18 2056 10/30/18 2228   10/30/18 1945  ceFAZolin (ANCEF) IVPB 2g/100 mL premix  Status:  Discontinued     2 g 200 mL/hr over 30 Minutes Intravenous  Once 10/30/18 1935 10/30/18 2248       Assessment/Plan Elevated creatinine - resolved Hypertension - home meds Hyperlipidemia  Diabetes mellitus - SSI History of PVCs and bradycardia Right renal nodule - outpt follow up  INCARCERATED INDIRECT RIGHT INGUINAL HERNIA S/p OPEN REPAIR OF INDIRECT INCARCERATED RIGHT INGUINAL HERNIA WITH MESH 12/21 Dr. Redmond Pulling - POD 1 - passing flatus  ID - ancef periop FEN - IVF, CLD VTE - SCDs, lovenox Foley - none Follow up - Dr. Redmond Pulling  Plan - Clear liquids, advance to fulls as tolerated. Ambulate today. PT consult. Labs in AM.   LOS: 1 day    Wellington Hampshire , [facility] 10/31/2018, 8:20 AM Pager: [phone] Mon 7:00 am -11:30 AM Tues-Fri 7:00 am-4:30 pm Sat-Sun 7:00 am-11:30 am

## 2018-11-01 ENCOUNTER — Encounter (HOSPITAL_COMMUNITY): Payer: Self-pay | Admitting: General Surgery

## 2018-11-01 LAB — CBC
HCT: 31 % — ABNORMAL LOW (ref 39.0–52.0)
Hemoglobin: 10.2 g/dL — ABNORMAL LOW (ref 13.0–17.0)
MCH: 29.3 pg (ref 26.0–34.0)
MCHC: 32.9 g/dL (ref 30.0–36.0)
MCV: 89.1 fL (ref 80.0–100.0)
Platelets: 154 10*3/uL (ref 150–400)
RBC: 3.48 MIL/uL — ABNORMAL LOW (ref 4.22–5.81)
RDW: 12.7 % (ref 11.5–15.5)
WBC: 7.6 10*3/uL (ref 4.0–10.5)
nRBC: 0 % (ref 0.0–0.2)

## 2018-11-01 LAB — GLUCOSE, CAPILLARY
GLUCOSE-CAPILLARY: 129 mg/dL — AB (ref 70–99)
Glucose-Capillary: 112 mg/dL — ABNORMAL HIGH (ref 70–99)
Glucose-Capillary: 118 mg/dL — ABNORMAL HIGH (ref 70–99)
Glucose-Capillary: 122 mg/dL — ABNORMAL HIGH (ref 70–99)
Glucose-Capillary: 132 mg/dL — ABNORMAL HIGH (ref 70–99)
Glucose-Capillary: 136 mg/dL — ABNORMAL HIGH (ref 70–99)

## 2018-11-01 LAB — BASIC METABOLIC PANEL
Anion gap: 16 — ABNORMAL HIGH (ref 5–15)
BUN: 15 mg/dL (ref 8–23)
CO2: 24 mmol/L (ref 22–32)
Calcium: 7.9 mg/dL — ABNORMAL LOW (ref 8.9–10.3)
Chloride: 100 mmol/L (ref 98–111)
Creatinine, Ser: 1 mg/dL (ref 0.61–1.24)
GFR calc Af Amer: >60 mL/min (ref >60)
GFR calc non Af Amer: >60 mL/min (ref >60)
Glucose, Bld: 116 mg/dL — ABNORMAL HIGH (ref 70–99)
Potassium: 3.7 mmol/L (ref 3.5–5.1)
SODIUM: 140 mmol/L (ref 135–145)

## 2018-11-01 MED ORDER — FLEET ENEMA 7-19 GM/118ML RE ENEM: 1.0000 | ENEMA | Freq: Every day | RECTAL | Status: DC | PRN

## 2018-11-01 MED ORDER — SENNOSIDES-DOCUSATE SODIUM 8.6-50 MG PO TABS
1.0000 | ORAL_TABLET | Freq: Two times a day (BID) | ORAL | Status: DC
  Administered 2018-11-01 – 2018-11-02 (×3): 1 via ORAL
  Filled 2018-11-01 (×3): qty 1

## 2018-11-01 MED ORDER — POLYETHYLENE GLYCOL 3350 17 G PO PACK
17.0000 g | PACK | Freq: Three times a day (TID) | ORAL | Status: DC
  Administered 2018-11-01 – 2018-11-02 (×4): 17 g via ORAL
  Filled 2018-11-01 (×4): qty 1

## 2018-11-01 MED ORDER — PSYLLIUM 95 % PO PACK
1.0000 | PACK | Freq: Every day | ORAL | Status: DC
  Administered 2018-11-01: 1 via ORAL
  Filled 2018-11-01 (×2): qty 1

## 2018-11-01 MED ORDER — METFORMIN HCL 500 MG PO TABS
1000.0000 mg | ORAL_TABLET | Freq: Two times a day (BID) | ORAL | Status: DC
  Administered 2018-11-02: 1000 mg via ORAL
  Filled 2018-11-01: qty 2

## 2018-11-01 MED ORDER — INSULIN ASPART 100 UNIT/ML ~~LOC~~ SOLN: 0.0000 [IU] | Freq: Three times a day (TID) | SUBCUTANEOUS | Status: DC

## 2018-11-01 MED ORDER — FLEET ENEMA 7-19 GM/118ML RE ENEM
1.0000 | ENEMA | Freq: Once | RECTAL | Status: AC
  Administered 2018-11-01: 1 via RECTAL
  Filled 2018-11-01: qty 1

## 2018-11-01 NOTE — Progress Notes (Signed)
.  2 Days Post-Op  Subjective: Doing better Tolerating clear liquids.  Ambulating in halls. No bowel movement in 6 days which is of concern to him PT thought he had no issues and no home needs.  Objective: Vital signs in last 24 hours: Temp:  [97.8 F (36.6 C)-98.9 F (37.2 C)] 98.4 F (36.9 C) (12/23 0511) Pulse Rate:  [57-76] 62 (12/23 0511) Resp:  [18-24] 18 (12/23 0511) BP: (118-168)/(58-76) 142/76 (12/23 0511) SpO2:  [95 %-100 %] 96 % (12/23 0511) Last BM Date: 10/26/18  Intake/Output from previous day: 12/22 0701 - 12/23 0700 In: 3751.1 [P.O.:1170; I.V.:2581.1] Out: 2575 [Urine:2575] Intake/Output this shift: Total I/O In: 2821.1 [P.O.:240; I.V.:2581.1] Out: 98 [Urine:1050]    PE: Gen:  Alert, NAD, pleasant HEENT: EOM's intact, pupils equal and round Pulm:  effort normal Abd: Soft, NT/borderline  distended but otherwise benign, +BS, no HSM, R groin incision looks good.  No hematoma.  No ecchymoses.  Penis, testes normal. Psych: A&Ox3  Skin: no rashes noted, warm and dry   Lab Results:  Results for orders placed or performed during the hospital encounter of 10/30/18 (from the past 24 hour(s))  Glucose, capillary     Status: Abnormal   Collection Time: 10/31/18  7:51 AM  Result Value Ref Range   Glucose-Capillary 124 (H) 70 - 99 mg/dL  Glucose, capillary     Status: Abnormal   Collection Time: 10/31/18 11:43 AM  Result Value Ref Range   Glucose-Capillary 128 (H) 70 - 99 mg/dL  Glucose, capillary     Status: None   Collection Time: 10/31/18  4:12 PM  Result Value Ref Range   Glucose-Capillary 88 70 - 99 mg/dL  Glucose, capillary     Status: Abnormal   Collection Time: 10/31/18  8:13 PM  Result Value Ref Range   Glucose-Capillary 159 (H) 70 - 99 mg/dL  Glucose, capillary     Status: Abnormal   Collection Time: 11/01/18 12:10 AM  Result Value Ref Range   Glucose-Capillary 112 (H) 70 - 99 mg/dL  CBC     Status: Abnormal   Collection Time: 11/01/18   1:43 AM  Result Value Ref Range   WBC 7.6 4.0 - 10.5 K/uL   RBC 3.48 (L) 4.22 - 5.81 MIL/uL   Hemoglobin 10.2 (L) 13.0 - 17.0 g/dL   HCT 31.0 (L) 39.0 - 52.0 %   MCV 89.1 80.0 - 100.0 fL   MCH 29.3 26.0 - 34.0 pg   MCHC 32.9 30.0 - 36.0 g/dL   RDW 12.7 11.5 - 15.5 %   Platelets 154 150 - 400 K/uL   nRBC 0.0 0.0 - 0.2 %  Basic metabolic panel     Status: Abnormal   Collection Time: 11/01/18  1:43 AM  Result Value Ref Range   Sodium 140 135 - 145 mmol/L   Potassium 3.7 3.5 - 5.1 mmol/L   Chloride 100 98 - 111 mmol/L   CO2 24 22 - 32 mmol/L   Glucose, Bld 116 (H) 70 - 99 mg/dL   BUN 15 8 - 23 mg/dL   Creatinine, Ser 1.00 0.61 - 1.24 mg/dL   Calcium 7.9 (L) 8.9 - 10.3 mg/dL   GFR calc non Af Amer >60 >60 mL/min   GFR calc Af Amer >60 >60 mL/min   Anion gap 16 (H) 5 - 15  Glucose, capillary     Status: Abnormal   Collection Time: 11/01/18  4:01 AM  Result Value Ref Range   Glucose-Capillary  122 (H) 70 - 99 mg/dL     Studies/Results: No results found.  Marland Kitchen acetaminophen  650 mg Oral Q6H  . allopurinol  300 mg Oral Daily  . amLODipine  10 mg Oral Daily  . carvedilol  3.125 mg Oral BID WC  . cloNIDine  0.6 mg Oral BID  . enoxaparin (LOVENOX) injection  40 mg Subcutaneous Q24H  . gabapentin  100 mg Oral BID  . insulin aspart  0-15 Units Subcutaneous Q4H  . lisinopril  40 mg Oral Daily  . pantoprazole (PROTONIX) IV  40 mg Intravenous QHS  . polyethylene glycol  17 g Oral TID PC  . senna-docusate  1 tablet Oral BID     Assessment/Plan: s/p Procedure(s): OPEN REPAIR OF INCARCERATED RIGHT INGUINAL HERNIA WITH MESH  Elevated creatinine - resolved.  Creatinine 1.0 today Hypertension - home meds Hyperlipidemia  Diabetes mellitus - SSI History of PVCs and bradycardia Right renal nodule - outpt follow up  [place] S/p OPEN REPAIR OFINDIRECTINCARCERATED RIGHT INGUINAL HERNIA WITH MESH 12/21 Dr. Redmond Pulling - POD 2 - passing flatus  ID -  ancef periop FEN - IVF, CLD VTE - SCDs, lovenox Foley - none Follow up - Dr. Redmond Pulling  Plan -advance diet.  Increase frequency of MiraLAX and Senokot.  If has a bowel movement he can go home tomorrow  @PROBHOSP @  LOS: 2 days    Nicholas Shelton 11/01/2018  . .prob

## 2018-11-02 LAB — GLUCOSE, CAPILLARY: Glucose-Capillary: 113 mg/dL — ABNORMAL HIGH (ref 70–99)

## 2018-11-02 MED ORDER — TRAMADOL HCL 50 MG PO TABS: 50.0000 mg | ORAL_TABLET | Freq: Four times a day (QID) | ORAL | 0 refills | Status: DC | PRN

## 2018-11-02 NOTE — Discharge Summary (Signed)
Patient ID: Nicholas Shelton [id number] 77 y.o. [birth year]  Admit date: 10/30/2018  Discharge date and time: 11/02/2018  Admitting Physician: Greer Pickerel, MD  Discharge Physician: Adin Hector  Admission Diagnoses: Incarcerated inguinal hernia [K40.30]  Discharge Diagnoses: Incarcerated indirect right inguinal hernia                                         Hypertension                                         Type 2 diabetes                                         Right renal nodule vs. cysts.  Needs outpatient follow-up and ultrasound                                         History of PVCs and bradycardia  Operations: Procedure(s): OPEN REPAIR OF INCARCERATED RIGHT INGUINAL HERNIA WITH MESH  Admission Condition: fair  Discharged Condition: good  Indication for Admission: This is a 77 year old man who came to the emergency room for constipation.  He denies groin pain or bulge in the groin.  CT scan was performed showing a partial small bowel obstruction due to incarcerated right inguinal hernia but no evidence of bowel compromise.  On evaluation by Dr. Redmond Pulling he was found to have a right groin mass that was only partially reducible.  He was admitted and taken to the operating room  Hospital Course: On the day of admission the patient was taken to the operating room and underwent open repair of indirect incarcerated right inguinal hernia with mesh.  Dr. Redmond Pulling performed the surgery.  The surgery was uneventful.  Postoperatively the patient did fairly well.  Diabetes was managed with sliding scale insulin.  Hypertension was well controlled.  He remained constipated until we intensified his bowel regimen and then he had a bowel movement on December 23 and felt ready to be discharged on December 24.     At the time of discharge the patient's abdomen was soft.  The right groin wound looked good.  He had mild edema of the penis and scrotum but no hematoma.  He was voiding well.      He was  advised that his CT scan showed some cysts and a possible small nodule of his kidney and that outpatient ultrasound and follow-up was appropriate.  That can be managed by Dr. Redmond Pulling as an outpatient.      He was advised to use Tylenol for pain.  I called in a prescription for tramadol to his pharmacy.  Diet and activities were discussed.  Home care instructions were given.  He will follow-up with Dr. Redmond Pulling on January 3.  Consults: None  Significant Diagnostic Studies: CT scan of abdomen and pelvis  Treatments: surgery: Open repair incarcerated right inguinal hernia with mesh  Disposition: Home  Patient Instructions:  Allergies as of 11/02/2018   No Known Allergies     Medication List    TAKE these medications   acetaminophen 325 MG tablet Commonly  known as:  TYLENOL Take 325 mg by mouth every 6 (six) hours as needed for headache (pain).   allopurinol 300 MG tablet Commonly known as:  ZYLOPRIM Take 300 mg by mouth daily.   amLODipine 10 MG tablet Commonly known as:  NORVASC Take 10 mg by mouth daily.   atorvastatin 20 MG tablet Commonly known as:  LIPITOR Take 1 tablet (20 mg total) by mouth 2 (two) times a week. What changed:    when to take this  additional instructions   carvedilol 3.125 MG tablet Commonly known as:  COREG Take 1 tablet (3.125 mg total) by mouth 2 (two) times daily with a meal.   cloNIDine 0.3 MG tablet Commonly known as:  CATAPRES Take 0.6 mg by mouth 2 (two) times daily.   hydrochlorothiazide 25 MG tablet Commonly known as:  HYDRODIURIL Take 25 mg by mouth daily.   lactulose 10 GM/15ML solution Commonly known as:  CHRONULAC Take 45 mLs (30 g total) by mouth daily as needed for severe constipation.   lisinopril 40 MG tablet Commonly known as:  PRINIVIL,ZESTRIL Take 40 mg by mouth daily.   metFORMIN 1000 MG tablet Commonly known as:  GLUCOPHAGE Take 1,000 mg by mouth 2 (two) times daily with a meal.   pantoprazole 40 MG  tablet Commonly known as:  PROTONIX Take 40 mg by mouth daily.   senna-docusate 8.6-50 MG tablet Commonly known as:  Senokot-S Take 1 tablet by mouth 2 (two) times daily as needed for mild constipation.   traMADol 50 MG tablet Commonly known as:  ULTRAM Take 1 tablet (50 mg total) by mouth every 6 (six) hours as needed (mild pain).   VISINE OP Place 1 drop into the left eye daily as needed (irritation/dry eyes).            Discharge Care Instructions  (From admission, onward)         Start     Ordered   11/02/18 0000  Discharge wound care:    Comments:  The clear plastic superglue will wear off in about 3 weeks You may take a shower and pat the wound dry The swelling will go away in a few weeks You may see some bruising for a week or 2 but that will resolve   11/02/18 0600          Activity: No sports or heavy lifting for 30 days encouraged daily ambulation Diet: diabetic diet Wound Care: none needed  Follow-up:  With Dr. Greer Pickerel in 2 weeks.  Signed: Edsel Petrin. Dalbert Batman, M.D., FACS General and minimally invasive surgery Breast and Colorectal Surgery  11/02/2018, 6:02 AM

## 2018-11-02 NOTE — Progress Notes (Signed)
Pt  For discharge going home, discontinued peripheral IV line, given health teachings, next appointment, due med explained and understood, given all his personal belongings, no complain of pain at this time, wound site dry and intact with skin glued, ambulatory tolerates meal, waiting for his brother to pick him up.

## 2018-11-02 NOTE — Progress Notes (Signed)
Patient's penis is swollen. Pt able to urinate fine. Made Dr. Grandville Silos aware. Dc'd IVF. Will continue to monitor.

## 2020-12-03 ENCOUNTER — Other Ambulatory Visit: Payer: Self-pay | Admitting: Family Medicine

## 2020-12-03 DIAGNOSIS — I350 Nonrheumatic aortic (valve) stenosis: Secondary | ICD-10-CM

## 2020-12-06 ENCOUNTER — Other Ambulatory Visit: Payer: Self-pay

## 2020-12-06 ENCOUNTER — Ambulatory Visit: Payer: Medicare Other

## 2020-12-06 DIAGNOSIS — I350 Nonrheumatic aortic (valve) stenosis: Secondary | ICD-10-CM

## 2020-12-12 ENCOUNTER — Ambulatory Visit: Payer: Medicare Other | Admitting: Cardiology

## 2020-12-13 ENCOUNTER — Other Ambulatory Visit: Payer: Self-pay

## 2020-12-13 ENCOUNTER — Encounter: Payer: Self-pay | Admitting: Cardiology

## 2020-12-13 ENCOUNTER — Ambulatory Visit: Payer: Medicare Other | Admitting: Cardiology

## 2020-12-13 VITALS — BP 160/86 | HR 69 | Temp 98.2°F | Resp 16 | Ht 71.0 in | Wt 200.0 lb

## 2020-12-13 DIAGNOSIS — Z01810 Encounter for preprocedural cardiovascular examination: Secondary | ICD-10-CM

## 2020-12-13 DIAGNOSIS — I10 Essential (primary) hypertension: Secondary | ICD-10-CM

## 2020-12-13 DIAGNOSIS — R9431 Abnormal electrocardiogram [ECG] [EKG]: Secondary | ICD-10-CM

## 2020-12-13 DIAGNOSIS — I350 Nonrheumatic aortic (valve) stenosis: Secondary | ICD-10-CM

## 2020-12-13 DIAGNOSIS — E1165 Type 2 diabetes mellitus with hyperglycemia: Secondary | ICD-10-CM

## 2020-12-13 DIAGNOSIS — E782 Mixed hyperlipidemia: Secondary | ICD-10-CM

## 2020-12-13 DIAGNOSIS — R0989 Other specified symptoms and signs involving the circulatory and respiratory systems: Secondary | ICD-10-CM

## 2020-12-13 DIAGNOSIS — Z87891 Personal history of nicotine dependence: Secondary | ICD-10-CM

## 2020-12-13 NOTE — Progress Notes (Signed)
Date:  12/13/2020   ID:  [name], DOB 1940/12/09, MRN [id number]  PCP:  Christain Sacramento, MD  Cardiologist:  Rex Kras, DO, [facility] (established care 12/13/2020) Cardiac Electrophysiologist: Dr. Virl Axe.   REASON FOR CONSULT: Aortic Stenosis and Pre-op Clearance   REQUESTING PHYSICIAN:  Christain Sacramento, MD [place],  [place] [address]  Chief Complaint  Patient presents with  . Aortic Stenosis  . New Patient (Initial Visit)    HPI  Lindon Sherrow is a 80 y.o. male who presents to the office with a chief complaint of " management of aortic stenosis in preoperative clearance." Patient's past medical history and cardiovascular risk factors include: Aortic stenosis, hypertension, hyperlipidemia, non-insulin-dependent diabetes mellitus type 2, former smoker, advanced age.  He is referred to the office at the request of Christain Sacramento, MD for evaluation of aortic stenosis and preop clearance.  Patient is planning to undergo right knee replacement with Dr. Theda Sers in the near future.  The date of the surgery is still to be determined.  He does not have any active chest pain or anginal equivalents.  He denies shortness of breath or heart failure symptoms.  Patient does have non-insulin-dependent diabetes mellitus type 2.  No prior history of myocardial infarction, congestive heart failure, or stroke/TIA.  Patient is known to have aortic stenosis and on most recent echocardiogram the severity is consistent with moderate AS with preserved LVEF.  Functionally patient states that he works out 1.5 hours/day.  However given his osteoarthritis of the knee is not able to do much treadmill.  He spends majority of the time lifting weights.  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  Patient's blood pressure is not well controlled at today's office visit.  He states at home it is usually between 130-135 mmHg on the current antihypertensive medications being managed by his primary care  provider.  He attributes his uncontrolled hypertension at today's office visit due to not taking the morning medications.   ALLERGIES: No Known Allergies  MEDICATION LIST PRIOR TO VISIT: Current Meds  Medication Sig  . acetaminophen (TYLENOL) 325 MG tablet Take 325 mg by mouth every 6 (six) hours as needed for headache (pain).  Marland Kitchen amLODipine (NORVASC) 10 MG tablet Take 10 mg by mouth daily.  Marland Kitchen atorvastatin (LIPITOR) 20 MG tablet Take 1 tablet (20 mg total) by mouth 2 (two) times a week. (Patient taking differently: Take 20 mg by mouth See admin instructions. Take one tablet (20 mg) by mouth twice weekly - Monday and Thursday)  . carvedilol (COREG) 3.125 MG tablet Take 1 tablet (3.125 mg total) by mouth 2 (two) times daily with a meal.  . cloNIDine (CATAPRES) 0.3 MG tablet Take 0.6 mg by mouth 2 (two) times daily.   . hydrochlorothiazide (HYDRODIURIL) 25 MG tablet Take 25 mg by mouth daily.  Marland Kitchen lisinopril (PRINIVIL,ZESTRIL) 40 MG tablet Take 40 mg by mouth daily.  . metFORMIN (GLUCOPHAGE) 1000 MG tablet Take 1,000 mg by mouth 2 (two) times daily with a meal.  . pantoprazole (PROTONIX) 40 MG tablet Take 40 mg by mouth daily.     PAST MEDICAL HISTORY: Past Medical History:  Diagnosis Date  . Abnormal blood chemistry test   . Actinic keratosis   . Arthritis   . AV block   . Blood transfusion without reported diagnosis   . Bradycardia   . Degeneration of lumbar or lumbosacral intervertebral disc   . Deviated nasal septum   .  Diabetes mellitus without complication ([place])   . Gastroesophageal reflux disease without esophagitis   . Generalized anxiety disorder   . Gout with tophus   . Headache   . Hearing loss   . Hyperlipidemia   . Hypertension   . Insomnia   . Ischemia   . Murmur   . Nasal turbinate hypertrophy   . Osteoarthritis of wrist   . PAC (premature atrial contraction)   . Peptic ulcer   . Peripheral neuropathy   . PVC (premature ventricular contraction)   . Seasonal  allergic rhinitis due to pollen     PAST SURGICAL HISTORY: Past Surgical History:  Procedure Laterality Date  . COLONOSCOPY    . ELBOW SURGERY     pinched nerve on both arms  . INGUINAL HERNIA REPAIR Right 10/30/2018   Procedure: OPEN REPAIR OF INCARCERATED RIGHT INGUINAL HERNIA WITH MESH;  Surgeon: Greer Pickerel, MD;  Location: [facility];  Service: General;  Laterality: Right;  . WRIST SURGERY     pinched nerve    FAMILY HISTORY: The patient family history includes Heart attack in his mother.  SOCIAL HISTORY:  The patient  reports that he has quit smoking. He has never used smokeless tobacco. He reports that he does not drink alcohol and does not use drugs.  REVIEW OF SYSTEMS: Review of Systems  Constitutional: Negative for chills and fever.  HENT: Negative for hoarse voice and nosebleeds.   Eyes: Negative for discharge, double vision and pain.  Cardiovascular: Negative for chest pain, claudication, dyspnea on exertion, leg swelling, near-syncope, orthopnea, palpitations, paroxysmal nocturnal dyspnea and syncope.  Respiratory: Negative for hemoptysis and shortness of breath.   Musculoskeletal: Negative for muscle cramps and myalgias.  Gastrointestinal: Negative for abdominal pain, constipation, diarrhea, hematemesis, hematochezia, melena, nausea and vomiting.  Neurological: Negative for dizziness and light-headedness.    PHYSICAL EXAM: Vitals with BMI 12/13/2020 11/02/2018 11/02/2018  Height _0  - -  Weight 200 lbs - -  BMI 54.27 - -  Systolic 062 376 283  Diastolic 86 63 68  Pulse 69 53 58   CONSTITUTIONAL: Well-developed and well-nourished. No acute distress.  SKIN: Skin is warm and dry. No rash noted. No cyanosis. No pallor. No jaundice HEAD: Normocephalic and atraumatic.  EYES: No scleral icterus MOUTH/THROAT: Moist oral membranes.  NECK: No JVD present. No thyromegaly noted. Bilateral carotid bruits  LYMPHATIC: No visible cervical adenopathy.  CHEST Normal  respiratory effort. No intercostal retractions  LUNGS: Clear to auscultation bilaterally. No stridor. No wheezes. No rales.  CARDIOVASCULAR: Regular rate and rhythm, positive S1-S2, SEM 2RICS, no rubs or gallops appreciated. ABDOMINAL: No apparent ascites.  EXTREMITIES: No peripheral edema  HEMATOLOGIC: No significant bruising NEUROLOGIC: Oriented to person, place, and time. Nonfocal. Normal muscle tone.  PSYCHIATRIC: Normal mood and affect. Normal behavior. Cooperative  CARDIAC DATABASE: EKG: 12/13/2020: Probable normal sinus rhythm cannot rule out ectopic atrial rhythm.  63 bpm, first-degree AV block, intraventricular conduction delay, diffuse nonspecific T wave abnormalities.    Echocardiogram: 12/06/2020: Normal LV systolic function with visual EF 55-60%. Left ventricle cavity is normal in size. Moderate to severe concentric hypertrophy of the left ventricle. Normal global wall motion. Doppler evidence of grade II  diastolic dysfunction, elevated LAP. Left atrial cavity is severely dilated. Moderate aortic valve leaflet thickening with moderate calcification. Moderately restricted aortic valve leaflets. Moderate aortic stenosis. Peak velocity 3.28ms, Peak Gradient 47.5 mmHg, Mean Gradient 26.6 mmHg, AVA 1.1cm, DI 0.3. Mild (Grade I) mitral regurgitation. Mild tricuspid regurgitation. Mild  pulmonary hypertension. RVSP measures 37 mmHg. IVC is dilated with a respiratory response of >50%. Prior study dated 11/26/2017: LVEF 60-65%, mild LVH, Grade 2 DD, Mild AS, mildly dilated LA, PASP 46mHg.   Stress Testing: No results found for this or any previous visit from the past 1095 days.  Heart Catheterization: NA  LABORATORY DATA: External Labs: Collected: 11/29/2020 Creatinine 0.88 mg/dL. eGFR: 86 mL/min per 1.73 m Lipid profile: Total cholesterol 186, triglycerides 131, HDL 39, LDL 116, non HDL 147 Hemoglobin A1c: 7  IMPRESSION:    ICD-10-CM   1. Preop cardiovascular exam   Z01.810 PCV MYOCARDIAL PERFUSION WITH LEXISCAN  2. Nonspecific abnormal electrocardiogram (ECG) (EKG)  R94.31 PCV MYOCARDIAL PERFUSION WITH LEXISCAN  3. Nonrheumatic aortic valve stenosis  I35.0 EKG 12-Lead  4. Type 2 diabetes mellitus with hyperglycemia, without long-term current use of insulin (HCC)  E11.65   5. Benign hypertension  I10   6. Mixed hyperlipidemia  E78.2   7. Former smoker  Z87.891   815 Bilateral carotid bruits  R09.89 PCV CAROTID DUPLEX (BILATERAL)     RECOMMENDATIONS: HHuxton Glausis a 80y.o. male whose past medical history and cardiac risk factors include: Aortic stenosis, hypertension, hyperlipidemia, non-insulin-dependent diabetes mellitus type 2, former smoker, advanced age.  Preoperative risk stratification:  Patient is planning to undergo a right knee replacement with Dr. CTheda Sersand the date of surgery is to be determined.  Patient is overall functional status is limited due to osteoarthritis of the knee.  He spends majority of time weight lifting.    Given EKG findings would recommend an ischemic evaluation prior to risk stratification for his upcoming noncardiac surgery.  Patient is agreeable with the plan of care.  Nuclear stress test recommended to evaluate for reversible ischemia.  Benign essential hypertension:  Patient's blood pressure is not well controlled at today's office visit.  This is most likely secondary to him not taking his morning blood pressure medications.  Patient states that his home blood pressures are very well controlled.  I educated him that it is imperative that his blood pressures are under better control prior to his upcoming noncardiac surgery.  I also encouraged him to discuss possibly weaning off of clonidine with his PCP and considering other antihypertensive medications.  As clonidine can cause conduction abnormalities and given his ECG today cannot rule out ectopic atrial rhythm and he also has 1st degree AV block and  IVCD.   Moderate aortic stenosis:  Asymptomatic.  Patient denies any symptoms of angina, congestive heart failure, or syncope.  He is asked to seek medical attention if any of the 3 cardinal signs of aortic stenosis surface.  We will continue to monitor.  Bilateral carotid bruits:  May be secondary to underlying aortic stenosis but cannot rule out carotid artery atherosclerosis.  Plan carotid duplex.   Hyperlipidemia:  Independently reviewed the most recent lipid profile provided by his PCP as a part of this consultation.  Patient's LDL is greater than 70 mg/dL in the setting of non-insulin-dependent diabetes mellitus type 2.  May consider up titration of statin therapy to further optimize his lipid profile.  Will defer to primary team at this time.   FINAL MEDICATION LIST END OF ENCOUNTER: No orders of the defined types were placed in this encounter.   Current Outpatient Medications:  .  acetaminophen (TYLENOL) 325 MG tablet, Take 325 mg by mouth every 6 (six) hours as needed for headache (pain)., Disp: , Rfl:  .  amLODipine (NORVASC) 10  MG tablet, Take 10 mg by mouth daily., Disp: , Rfl:  .  atorvastatin (LIPITOR) 20 MG tablet, Take 1 tablet (20 mg total) by mouth 2 (two) times a week. (Patient taking differently: Take 20 mg by mouth See admin instructions. Take one tablet (20 mg) by mouth twice weekly - Monday and Thursday), Disp: 10 tablet, Rfl: 10 .  carvedilol (COREG) 3.125 MG tablet, Take 1 tablet (3.125 mg total) by mouth 2 (two) times daily with a meal., Disp: 60 tablet, Rfl: 11 .  cloNIDine (CATAPRES) 0.3 MG tablet, Take 0.6 mg by mouth 2 (two) times daily. , Disp: , Rfl:  .  hydrochlorothiazide (HYDRODIURIL) 25 MG tablet, Take 25 mg by mouth daily., Disp: , Rfl:  .  lisinopril (PRINIVIL,ZESTRIL) 40 MG tablet, Take 40 mg by mouth daily., Disp: , Rfl:  .  metFORMIN (GLUCOPHAGE) 1000 MG tablet, Take 1,000 mg by mouth 2 (two) times daily with a meal., Disp: , Rfl:  .   pantoprazole (PROTONIX) 40 MG tablet, Take 40 mg by mouth daily., Disp: , Rfl:   Orders Placed This Encounter  Procedures  . PCV MYOCARDIAL PERFUSION WITH LEXISCAN  . EKG 12-Lead  . PCV CAROTID DUPLEX (BILATERAL)    There are no Patient Instructions on file for this visit.   --Continue cardiac medications as reconciled in final medication list. --Return in about 3 months (around 03/12/2021) for Follow up aortic stenosis. . Or sooner if needed. --Continue follow-up with your primary care physician regarding the management of your other chronic comorbid conditions.  Patient's questions and concerns were addressed to his satisfaction. He voices understanding of the instructions provided during this encounter.   This note was created using a voice recognition software as a result there may be grammatical errors inadvertently enclosed that do not reflect the nature of this encounter. Every attempt is made to correct such errors.  Rex Kras, [place], [facility]  Pager: [phone] Office: [phone]

## 2020-12-19 ENCOUNTER — Other Ambulatory Visit: Payer: Self-pay

## 2020-12-19 ENCOUNTER — Ambulatory Visit: Payer: Medicare Other

## 2020-12-19 DIAGNOSIS — R0989 Other specified symptoms and signs involving the circulatory and respiratory systems: Secondary | ICD-10-CM

## 2020-12-24 ENCOUNTER — Other Ambulatory Visit: Payer: Self-pay

## 2020-12-24 ENCOUNTER — Ambulatory Visit: Payer: Medicare Other

## 2020-12-24 DIAGNOSIS — Z01810 Encounter for preprocedural cardiovascular examination: Secondary | ICD-10-CM

## 2020-12-24 DIAGNOSIS — R9431 Abnormal electrocardiogram [ECG] [EKG]: Secondary | ICD-10-CM

## 2020-12-31 NOTE — Progress Notes (Signed)
Spoke to patient's wife they are scheduled for 02/24

## 2020-12-31 NOTE — Progress Notes (Signed)
Spoke to patient sister in law and reminded her about his appt 2/24

## 2021-01-03 ENCOUNTER — Ambulatory Visit: Payer: Medicare Other | Admitting: Cardiology

## 2021-01-03 ENCOUNTER — Other Ambulatory Visit: Payer: Self-pay

## 2021-01-03 ENCOUNTER — Encounter: Payer: Self-pay | Admitting: Cardiology

## 2021-01-03 VITALS — BP 182/89 | HR 73 | Temp 98.2°F | Resp 16 | Ht 71.0 in | Wt 194.0 lb

## 2021-01-03 DIAGNOSIS — I350 Nonrheumatic aortic (valve) stenosis: Secondary | ICD-10-CM

## 2021-01-03 DIAGNOSIS — E1165 Type 2 diabetes mellitus with hyperglycemia: Secondary | ICD-10-CM

## 2021-01-03 DIAGNOSIS — R9439 Abnormal result of other cardiovascular function study: Secondary | ICD-10-CM

## 2021-01-03 DIAGNOSIS — I10 Essential (primary) hypertension: Secondary | ICD-10-CM

## 2021-01-03 DIAGNOSIS — E782 Mixed hyperlipidemia: Secondary | ICD-10-CM

## 2021-01-03 DIAGNOSIS — Z87891 Personal history of nicotine dependence: Secondary | ICD-10-CM

## 2021-01-03 DIAGNOSIS — R0989 Other specified symptoms and signs involving the circulatory and respiratory systems: Secondary | ICD-10-CM

## 2021-01-03 MED ORDER — ASPIRIN EC 81 MG PO TBEC: 81.0000 mg | DELAYED_RELEASE_TABLET | Freq: Every day | ORAL | 3 refills | Status: DC

## 2021-01-03 NOTE — H&P (View-Only) (Signed)
Date:  01/09/2021   ID:  [name], DOB 09/17/1941, MRN [id number]  PCP:  Christain Sacramento, MD  Cardiologist:  Rex Kras, DO, [facility] (established care 12/13/2020) Cardiac Electrophysiologist: Dr. Virl Axe.   Date: 01/03/21 Last Office Visit: 12/13/2020  Chief Complaint  Patient presents with  . Aortic Stenosis  . Follow-up  . Results    HPI  Nicholas Shelton is a 80 y.o. male who presents to the office with a chief complaint of " abnormal nuclear stress test and review test results." Patient's past medical history and cardiovascular risk factors include: Aortic stenosis, hypertension, hyperlipidemia, non-insulin-dependent diabetes mellitus type 2, former smoker, advanced age.  He is referred to the office at the request of Christain Sacramento, MD for evaluation of aortic stenosis and preop clearance.  Patient is accompanied by his sister-in-law Nicholas Shelton who can be reached at [phone]. Patient provides verbal consent in regards to discussing his medical information and treatment plan in her presence.  He was referred to the office for preoperative risk stratification for upcoming right knee replacement. Today he tells me that the surgery is tentatively scheduled for Mar 25, 2021 with Dr. Theda Sers at [facility].  At the last office visit the shared decision was to proceed with Lexiscan to evaluate for reversible ischemia. The stress test was reported to be high risk study findings were reviewed with the patient and his sister-in-law and noted below for further reference. Given his moderate aortic stenosis patient had carotid bruits present on physical examination and therefore also underwent carotid duplex. Results reviewed with him in great detail at today's visit as well.  Patient states that he really wants to get the knee replacement done as soon as possible because it is affecting his day-to-day activities. Functionally patient states that he works out 1.5 hours/day.  However given  his osteoarthritis of the knee is not able to do much treadmill.  He spends majority of the time lifting weights.  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  Patient's blood pressure is not well controlled at today's office visit.  He states at home it is usually between 130-135 mmHg on the current antihypertensive medications being managed by his primary care provider. His morning blood pressures prior to coming to the office was 137/67.   ALLERGIES: No Known Allergies  MEDICATION LIST PRIOR TO VISIT: Current Meds  Medication Sig  . amLODipine (NORVASC) 10 MG tablet Take 10 mg by mouth daily.  Marland Kitchen aspirin EC 81 MG tablet Take 1 tablet (81 mg total) by mouth daily. Swallow whole.  Marland Kitchen atorvastatin (LIPITOR) 20 MG tablet Take 1 tablet (20 mg total) by mouth 2 (two) times a week. (Patient taking differently: Take 20 mg by mouth See admin instructions. Take one tablet (20 mg) by mouth twice weekly - Monday and Thursday)  . carvedilol (COREG) 3.125 MG tablet Take 1 tablet (3.125 mg total) by mouth 2 (two) times daily with a meal.  . cloNIDine (CATAPRES) 0.3 MG tablet Take 0.6 mg by mouth 2 (two) times daily.   . hydrochlorothiazide (HYDRODIURIL) 25 MG tablet Take 25 mg by mouth daily.  Marland Kitchen lisinopril (PRINIVIL,ZESTRIL) 40 MG tablet Take 40 mg by mouth every evening.  . metFORMIN (GLUCOPHAGE) 1000 MG tablet Take 1,000 mg by mouth 2 (two) times daily with a meal.  . pantoprazole (PROTONIX) 40 MG tablet Take 40 mg by mouth daily.  . [DISCONTINUED] acetaminophen (TYLENOL) 325 MG tablet Take 325 mg by mouth  every 6 (six) hours as needed for headache (pain).     PAST MEDICAL HISTORY: Past Medical History:  Diagnosis Date  . Abnormal blood chemistry test   . Actinic keratosis   . Arthritis   . AV block   . Blood transfusion without reported diagnosis   . Bradycardia   . Degeneration of lumbar or lumbosacral intervertebral disc   . Deviated nasal septum   . Diabetes mellitus without complication  ([place])   . Gastroesophageal reflux disease without esophagitis   . Generalized anxiety disorder   . Gout with tophus   . Headache   . Hearing loss   . Hyperlipidemia   . Hypertension   . Insomnia   . Ischemia   . Murmur   . Nasal turbinate hypertrophy   . Osteoarthritis of wrist   . PAC (premature atrial contraction)   . Peptic ulcer   . Peripheral neuropathy   . PVC (premature ventricular contraction)   . Seasonal allergic rhinitis due to pollen     PAST SURGICAL HISTORY: Past Surgical History:  Procedure Laterality Date  . COLONOSCOPY    . ELBOW SURGERY     pinched nerve on both arms  . INGUINAL HERNIA REPAIR Right 10/30/2018   Procedure: OPEN REPAIR OF INCARCERATED RIGHT INGUINAL HERNIA WITH MESH;  Surgeon: Greer Pickerel, MD;  Location: [place];  Service: General;  Laterality: Right;  . WRIST SURGERY     pinched nerve    FAMILY HISTORY: The patient family history includes Heart attack in his mother.  SOCIAL HISTORY:  The patient  reports that he has quit smoking. He has never used smokeless tobacco. He reports that he does not drink alcohol and does not use drugs.  REVIEW OF SYSTEMS: Review of Systems  Constitutional: Negative for chills and fever.  HENT: Negative for hoarse voice and nosebleeds.        Difficulty hearing  Eyes: Negative for discharge, double vision and pain.  Cardiovascular: Negative for chest pain, claudication, dyspnea on exertion, leg swelling, near-syncope, orthopnea, palpitations, paroxysmal nocturnal dyspnea and syncope.  Respiratory: Negative for hemoptysis and shortness of breath.   Musculoskeletal: Negative for muscle cramps and myalgias.  Gastrointestinal: Negative for abdominal pain, constipation, diarrhea, hematemesis, hematochezia, melena, nausea and vomiting.  Neurological: Negative for dizziness and light-headedness.    PHYSICAL EXAM: Vitals with BMI 01/03/2021 12/13/2020 11/02/2018  Height _0  _1  -  Weight 194 lbs 200 lbs -   BMI 61.60 73.71 -  Systolic 062 694 854  Diastolic 89 86 63  Pulse 73 69 53   CONSTITUTIONAL: Well-developed and well-nourished. No acute distress.  SKIN: Skin is warm and dry. No rash noted. No cyanosis. No pallor. No jaundice HEAD: Normocephalic and atraumatic.  EYES: No scleral icterus MOUTH/THROAT: Moist oral membranes.  NECK: No JVD present. No thyromegaly noted. Bilateral carotid bruits  LYMPHATIC: No visible cervical adenopathy.  CHEST Normal respiratory effort. No intercostal retractions  LUNGS: Clear to auscultation bilaterally. No stridor. No wheezes. No rales.  CARDIOVASCULAR: Regular rate and rhythm, positive S1-S2, SEM 2RICS, no rubs or gallops appreciated. ABDOMINAL: No apparent ascites.  EXTREMITIES: No peripheral edema  HEMATOLOGIC: No significant bruising NEUROLOGIC: Oriented to person, place, and time. Nonfocal. Normal muscle tone.  PSYCHIATRIC: Normal mood and affect. Normal behavior. Cooperative  CARDIAC DATABASE: EKG: 12/13/2020: Probable normal sinus rhythm cannot rule out ectopic atrial rhythm.  63 bpm, first-degree AV block, intraventricular conduction delay, diffuse nonspecific T wave abnormalities.    Echocardiogram: 12/06/2020: Normal  LV systolic function with visual EF 55-60%. Left ventricle cavity is normal in size. Moderate to severe concentric hypertrophy of the left ventricle. Normal global wall motion. Doppler evidence of grade II  diastolic dysfunction, elevated LAP. Left atrial cavity is severely dilated. Moderate aortic valve leaflet thickening with moderate calcification. Moderately restricted aortic valve leaflets. Moderate aortic stenosis. Peak velocity 3.34ms, Peak Gradient 47.5 mmHg, Mean Gradient 26.6 mmHg, AVA 1.1cm, DI 0.3. Mild (Grade I) mitral regurgitation. Mild tricuspid regurgitation. Mild pulmonary hypertension. RVSP measures 37 mmHg. IVC is dilated with a respiratory response of >50%. Prior study dated 11/26/2017: LVEF 60-65%,  mild LVH, Grade 2 DD, Mild AS, mildly dilated LA, PASP 464mg.   Stress Testing: Lexiscan Tetrofosmin Stress Test 12/24/2020: Nondiagnostic ECG stress. The left ventricle is dilated in stress images more than rest images, LV end-diastolic volume 17517L. There is mild diaphragmatic attenuation artifact in the inferior wall. Superimposed on this there is a moderate-sized moderate decrease in counts in the basal inferior and inferolateral wall suggestive of ischemia. Gated SPECT imaging of the left ventricle was abnormal, demonstrating hypokinesis of the mid inferolateral wall, mid inferior wall and basal inferolateral wall. Stress LV EF is mildly dysfunctional 48%.  No previous exam available for comparison. High risk study.   Heart Catheterization: NA  Carotid artery duplex 12/19/2020: Minimal stenosis in the right internal carotid artery (1-15%). Doppler velocity suggests stenosis in the right Minimal stenosis in the left internal carotid artery (1-15%). Doppler velocity suggests stenosis in the left external carotid artery (<50%). Mild heterogeneous plaque noted bilaterally.  Antegrade right vertebral artery flow. Antegrade left vertebral artery flow. External carotid artery stenosis is probably source of bruit. Follow up study is appropriate if clinically indicated.  LABORATORY DATA: External Labs: Collected: 11/29/2020 Creatinine 0.88 mg/dL. eGFR: 86 mL/min per 1.73 m Lipid profile: Total cholesterol 186, triglycerides 131, HDL 39, LDL 116, non HDL 147 Hemoglobin A1c: 7  IMPRESSION:    ICD-10-CM   1. Abnormal nuclear stress test  R94.39 CBC    Basic metabolic panel    Magnesium    Lipid Panel w/o Chol/HDL Ratio    aspirin EC 81 MG tablet    SARS-COV-2 RNA,(COVID-19) QUAL NAAT    CBC    Lipid Panel w/o Chol/HDL Ratio    Magnesium    Basic metabolic panel  2. Nonrheumatic aortic valve stenosis  I35.0 PCV ECHOCARDIOGRAM COMPLETE  3. Type 2 diabetes mellitus with  hyperglycemia, without long-term current use of insulin (HCC)  E11.65   4. Benign hypertension  I10   5. Mixed hyperlipidemia  E78.2 Lipid Panel w/o Chol/HDL Ratio    Lipid Panel w/o Chol/HDL Ratio  6. Former smoker  Z87.891   7.46Bilateral carotid bruits  R09.89      RECOMMENDATIONS: HoDoy Taaffes a 89.o. male whose past medical history and cardiac risk factors include: Aortic stenosis, hypertension, hyperlipidemia, non-insulin-dependent diabetes mellitus type 2, former smoker, advanced age.  Abnormal nuclear stress test:  During his preoperative stratification patient underwent Lexiscan to evaluate for reversible ischemia. He is noted to have moderate intensity, medium sized reversible perfusion defect involving the inferior and inferolateral segments with regional wall motion abnormalities and gated SPECT.  Clinically he works out 1.5 hours a day (per patient) majority of it is resistant training as his cardio is limited due to osteoarthritis of the knee.  Given the fact that the disease severity is at least medium in size and moderate intensity with multiple cardiovascular risk factors that  shared decision was to proceed with left heart catheterization prior to upcoming noncardiac surgery to rule out proximal/obstructive CAD.  Patient is agreeable with the plan of care and would like to proceed with left heart catheterization.  The procedure of left heart catheterization with possible intervention was explained to the patient in detail.   The indication, alternatives, risks and benefits were reviewed.   Complications include but not limited to bleeding, infection, vascular injury, stroke, myocardial infection, arrhythmia, kidney injury, radiation-related injury in the case of prolonged fluoroscopy use, emergency cardiac surgery, and death. The patient understands the risks of serious complication is 1-2 in 6295 with diagnostic cardiac cath and 1-2% or less with angioplasty/stenting.    The patient voices understanding and provides verbal feedback and wishes to proceed with coronary angiography with possible PCI.  Until the work-up is not complete he is recommended to take aspirin 81 mg p.o. daily.   Preoperative risk stratification:  Planning to undergo a right knee replacement with Dr. Theda Sers, tentatively scheduled for January 23, 2021.  Further recommendations to follow.  Benign essential hypertension:  Office blood pressure is not well controlled.  Patient states that his home blood pressures are very well controlled.  I educated him that it is imperative that his blood pressures are under better control prior to his upcoming noncardiac surgery.  I also encouraged him to discuss possibly weaning off of clonidine with his PCP and considering other antihypertensive medications.  As clonidine can cause conduction abnormalities and given his ECG today cannot rule out ectopic atrial rhythm and he also has 1st degree AV block and IVCD.   Moderate aortic stenosis:  Asymptomatic.  Patient denies any symptoms of angina, congestive heart failure, or syncope.  He is asked to seek medical attention if any of the 3 cardinal signs of aortic stenosis surface.  Repeat echocardiogram in July 2022.  Bilateral carotid bruits:  Carotid duplex results reviewed with the patient. He has minimal stenosis bilaterally. No additional testing needed at this time.   Hyperlipidemia:  Independently reviewed the most recent lipid profile provided by his PCP as a part of this consultation.  Patient's LDL is greater than 70 mg/dL in the setting of non-insulin-dependent diabetes mellitus type 2.  May consider up titration of statin therapy to further optimize his lipid profile.  Will defer to primary team at this time.   FINAL MEDICATION LIST END OF ENCOUNTER: Meds ordered this encounter  Medications  . aspirin EC 81 MG tablet    Sig: Take 1 tablet (81 mg total) by mouth daily.  Swallow whole.    Dispense:  90 tablet    Refill:  3    Current Outpatient Medications:  .  amLODipine (NORVASC) 10 MG tablet, Take 10 mg by mouth daily., Disp: , Rfl:  .  aspirin EC 81 MG tablet, Take 1 tablet (81 mg total) by mouth daily. Swallow whole., Disp: 90 tablet, Rfl: 3 .  atorvastatin (LIPITOR) 20 MG tablet, Take 1 tablet (20 mg total) by mouth 2 (two) times a week. (Patient taking differently: Take 20 mg by mouth See admin instructions. Take one tablet (20 mg) by mouth twice weekly - Monday and Thursday), Disp: 10 tablet, Rfl: 10 .  carvedilol (COREG) 3.125 MG tablet, Take 1 tablet (3.125 mg total) by mouth 2 (two) times daily with a meal., Disp: 60 tablet, Rfl: 11 .  cloNIDine (CATAPRES) 0.3 MG tablet, Take 0.6 mg by mouth 2 (two) times daily. , Disp: , Rfl:  .  hydrochlorothiazide (HYDRODIURIL) 25 MG tablet, Take 25 mg by mouth daily., Disp: , Rfl:  .  lisinopril (PRINIVIL,ZESTRIL) 40 MG tablet, Take 40 mg by mouth every evening., Disp: , Rfl:  .  metFORMIN (GLUCOPHAGE) 1000 MG tablet, Take 1,000 mg by mouth 2 (two) times daily with a meal., Disp: , Rfl:  .  pantoprazole (PROTONIX) 40 MG tablet, Take 40 mg by mouth daily., Disp: , Rfl:  .  acetaminophen (TYLENOL) 500 MG tablet, Take 500 mg by mouth every 8 (eight) hours as needed for moderate pain., Disp: , Rfl:   Orders Placed This Encounter  Procedures  . SARS-COV-2 RNA,(COVID-19) QUAL NAAT  . CBC  . Basic metabolic panel  . Magnesium  . Lipid Panel w/o Chol/HDL Ratio  . CBC  . Basic metabolic panel  . Lipid Panel w/o Chol/HDL Ratio  . PCV ECHOCARDIOGRAM COMPLETE    There are no Patient Instructions on file for this visit.   --Continue cardiac medications as reconciled in final medication list. --Return in about 4 weeks (around 01/31/2021) for Post heart catheterization. Or sooner if needed. --Continue follow-up with your primary care physician regarding the management of your other chronic comorbid  conditions.  Patient's questions and concerns were addressed to his satisfaction. He voices understanding of the instructions provided during this encounter.   This note was created using a voice recognition software as a result there may be grammatical errors inadvertently enclosed that do not reflect the nature of this encounter. Every attempt is made to correct such errors.  Rex Kras, [place], [facility]  Pager: [phone] Office: [phone]

## 2021-01-03 NOTE — Progress Notes (Signed)
Date:  01/09/2021   ID:  [name], DOB 09/17/1941, MRN [id number]  PCP:  Christain Sacramento, MD  Cardiologist:  Rex Kras, DO, [facility] (established care 12/13/2020) Cardiac Electrophysiologist: Dr. Virl Axe.   Date: 01/03/21 Last Office Visit: 12/13/2020  Chief Complaint  Patient presents with  . Aortic Stenosis  . Follow-up  . Results    HPI  Nicholas Shelton is a 80 y.o. male who presents to the office with a chief complaint of " abnormal nuclear stress test and review test results." Patient's past medical history and cardiovascular risk factors include: Aortic stenosis, hypertension, hyperlipidemia, non-insulin-dependent diabetes mellitus type 2, former smoker, advanced age.  He is referred to the office at the request of Christain Sacramento, MD for evaluation of aortic stenosis and preop clearance.  Patient is accompanied by his sister-in-law Caelum Federici who can be reached at [phone]. Patient provides verbal consent in regards to discussing his medical information and treatment plan in her presence.  He was referred to the office for preoperative risk stratification for upcoming right knee replacement. Today he tells me that the surgery is tentatively scheduled for Mar 25, 2021 with Dr. Theda Sers at [facility].  At the last office visit the shared decision was to proceed with Lexiscan to evaluate for reversible ischemia. The stress test was reported to be high risk study findings were reviewed with the patient and his sister-in-law and noted below for further reference. Given his moderate aortic stenosis patient had carotid bruits present on physical examination and therefore also underwent carotid duplex. Results reviewed with him in great detail at today's visit as well.  Patient states that he really wants to get the knee replacement done as soon as possible because it is affecting his day-to-day activities. Functionally patient states that he works out 1.5 hours/day.  However given  his osteoarthritis of the knee is not able to do much treadmill.  He spends majority of the time lifting weights.  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  Patient's blood pressure is not well controlled at today's office visit.  He states at home it is usually between 130-135 mmHg on the current antihypertensive medications being managed by his primary care provider. His morning blood pressures prior to coming to the office was 137/67.   ALLERGIES: No Known Allergies  MEDICATION LIST PRIOR TO VISIT: Current Meds  Medication Sig  . amLODipine (NORVASC) 10 MG tablet Take 10 mg by mouth daily.  Marland Kitchen aspirin EC 81 MG tablet Take 1 tablet (81 mg total) by mouth daily. Swallow whole.  Marland Kitchen atorvastatin (LIPITOR) 20 MG tablet Take 1 tablet (20 mg total) by mouth 2 (two) times a week. (Patient taking differently: Take 20 mg by mouth See admin instructions. Take one tablet (20 mg) by mouth twice weekly - Monday and Thursday)  . carvedilol (COREG) 3.125 MG tablet Take 1 tablet (3.125 mg total) by mouth 2 (two) times daily with a meal.  . cloNIDine (CATAPRES) 0.3 MG tablet Take 0.6 mg by mouth 2 (two) times daily.   . hydrochlorothiazide (HYDRODIURIL) 25 MG tablet Take 25 mg by mouth daily.  Marland Kitchen lisinopril (PRINIVIL,ZESTRIL) 40 MG tablet Take 40 mg by mouth every evening.  . metFORMIN (GLUCOPHAGE) 1000 MG tablet Take 1,000 mg by mouth 2 (two) times daily with a meal.  . pantoprazole (PROTONIX) 40 MG tablet Take 40 mg by mouth daily.  . [DISCONTINUED] acetaminophen (TYLENOL) 325 MG tablet Take 325 mg by mouth  every 6 (six) hours as needed for headache (pain).     PAST MEDICAL HISTORY: Past Medical History:  Diagnosis Date  . Abnormal blood chemistry test   . Actinic keratosis   . Arthritis   . AV block   . Blood transfusion without reported diagnosis   . Bradycardia   . Degeneration of lumbar or lumbosacral intervertebral disc   . Deviated nasal septum   . Diabetes mellitus without complication  ([place])   . Gastroesophageal reflux disease without esophagitis   . Generalized anxiety disorder   . Gout with tophus   . Headache   . Hearing loss   . Hyperlipidemia   . Hypertension   . Insomnia   . Ischemia   . Murmur   . Nasal turbinate hypertrophy   . Osteoarthritis of wrist   . PAC (premature atrial contraction)   . Peptic ulcer   . Peripheral neuropathy   . PVC (premature ventricular contraction)   . Seasonal allergic rhinitis due to pollen     PAST SURGICAL HISTORY: Past Surgical History:  Procedure Laterality Date  . COLONOSCOPY    . ELBOW SURGERY     pinched nerve on both arms  . INGUINAL HERNIA REPAIR Right 10/30/2018   Procedure: OPEN REPAIR OF INCARCERATED RIGHT INGUINAL HERNIA WITH MESH;  Surgeon: Greer Pickerel, MD;  Location: [place];  Service: General;  Laterality: Right;  . WRIST SURGERY     pinched nerve    FAMILY HISTORY: The patient family history includes Heart attack in his mother.  SOCIAL HISTORY:  The patient  reports that he has quit smoking. He has never used smokeless tobacco. He reports that he does not drink alcohol and does not use drugs.  REVIEW OF SYSTEMS: Review of Systems  Constitutional: Negative for chills and fever.  HENT: Negative for hoarse voice and nosebleeds.        Difficulty hearing  Eyes: Negative for discharge, double vision and pain.  Cardiovascular: Negative for chest pain, claudication, dyspnea on exertion, leg swelling, near-syncope, orthopnea, palpitations, paroxysmal nocturnal dyspnea and syncope.  Respiratory: Negative for hemoptysis and shortness of breath.   Musculoskeletal: Negative for muscle cramps and myalgias.  Gastrointestinal: Negative for abdominal pain, constipation, diarrhea, hematemesis, hematochezia, melena, nausea and vomiting.  Neurological: Negative for dizziness and light-headedness.    PHYSICAL EXAM: Vitals with BMI 01/03/2021 12/13/2020 11/02/2018  Height _0  _1  -  Weight 194 lbs 200 lbs -   BMI 61.60 73.71 -  Systolic 062 694 854  Diastolic 89 86 63  Pulse 73 69 53   CONSTITUTIONAL: Well-developed and well-nourished. No acute distress.  SKIN: Skin is warm and dry. No rash noted. No cyanosis. No pallor. No jaundice HEAD: Normocephalic and atraumatic.  EYES: No scleral icterus MOUTH/THROAT: Moist oral membranes.  NECK: No JVD present. No thyromegaly noted. Bilateral carotid bruits  LYMPHATIC: No visible cervical adenopathy.  CHEST Normal respiratory effort. No intercostal retractions  LUNGS: Clear to auscultation bilaterally. No stridor. No wheezes. No rales.  CARDIOVASCULAR: Regular rate and rhythm, positive S1-S2, SEM 2RICS, no rubs or gallops appreciated. ABDOMINAL: No apparent ascites.  EXTREMITIES: No peripheral edema  HEMATOLOGIC: No significant bruising NEUROLOGIC: Oriented to person, place, and time. Nonfocal. Normal muscle tone.  PSYCHIATRIC: Normal mood and affect. Normal behavior. Cooperative  CARDIAC DATABASE: EKG: 12/13/2020: Probable normal sinus rhythm cannot rule out ectopic atrial rhythm.  63 bpm, first-degree AV block, intraventricular conduction delay, diffuse nonspecific T wave abnormalities.    Echocardiogram: 12/06/2020: Normal  LV systolic function with visual EF 55-60%. Left ventricle cavity is normal in size. Moderate to severe concentric hypertrophy of the left ventricle. Normal global wall motion. Doppler evidence of grade II  diastolic dysfunction, elevated LAP. Left atrial cavity is severely dilated. Moderate aortic valve leaflet thickening with moderate calcification. Moderately restricted aortic valve leaflets. Moderate aortic stenosis. Peak velocity 3.34ms, Peak Gradient 47.5 mmHg, Mean Gradient 26.6 mmHg, AVA 1.1cm, DI 0.3. Mild (Grade I) mitral regurgitation. Mild tricuspid regurgitation. Mild pulmonary hypertension. RVSP measures 37 mmHg. IVC is dilated with a respiratory response of >50%. Prior study dated 11/26/2017: LVEF 60-65%,  mild LVH, Grade 2 DD, Mild AS, mildly dilated LA, PASP 464mg.   Stress Testing: Lexiscan Tetrofosmin Stress Test 12/24/2020: Nondiagnostic ECG stress. The left ventricle is dilated in stress images more than rest images, LV end-diastolic volume 17517L. There is mild diaphragmatic attenuation artifact in the inferior wall. Superimposed on this there is a moderate-sized moderate decrease in counts in the basal inferior and inferolateral wall suggestive of ischemia. Gated SPECT imaging of the left ventricle was abnormal, demonstrating hypokinesis of the mid inferolateral wall, mid inferior wall and basal inferolateral wall. Stress LV EF is mildly dysfunctional 48%.  No previous exam available for comparison. High risk study.   Heart Catheterization: NA  Carotid artery duplex 12/19/2020: Minimal stenosis in the right internal carotid artery (1-15%). Doppler velocity suggests stenosis in the right Minimal stenosis in the left internal carotid artery (1-15%). Doppler velocity suggests stenosis in the left external carotid artery (<50%). Mild heterogeneous plaque noted bilaterally.  Antegrade right vertebral artery flow. Antegrade left vertebral artery flow. External carotid artery stenosis is probably source of bruit. Follow up study is appropriate if clinically indicated.  LABORATORY DATA: External Labs: Collected: 11/29/2020 Creatinine 0.88 mg/dL. eGFR: 86 mL/min per 1.73 m Lipid profile: Total cholesterol 186, triglycerides 131, HDL 39, LDL 116, non HDL 147 Hemoglobin A1c: 7  IMPRESSION:    ICD-10-CM   1. Abnormal nuclear stress test  R94.39 CBC    Basic metabolic panel    Magnesium    Lipid Panel w/o Chol/HDL Ratio    aspirin EC 81 MG tablet    SARS-COV-2 RNA,(COVID-19) QUAL NAAT    CBC    Lipid Panel w/o Chol/HDL Ratio    Magnesium    Basic metabolic panel  2. Nonrheumatic aortic valve stenosis  I35.0 PCV ECHOCARDIOGRAM COMPLETE  3. Type 2 diabetes mellitus with  hyperglycemia, without long-term current use of insulin (HCC)  E11.65   4. Benign hypertension  I10   5. Mixed hyperlipidemia  E78.2 Lipid Panel w/o Chol/HDL Ratio    Lipid Panel w/o Chol/HDL Ratio  6. Former smoker  Z87.891   7.46Bilateral carotid bruits  R09.89      RECOMMENDATIONS: HoDoy Taaffes a 89.o. male whose past medical history and cardiac risk factors include: Aortic stenosis, hypertension, hyperlipidemia, non-insulin-dependent diabetes mellitus type 2, former smoker, advanced age.  Abnormal nuclear stress test:  During his preoperative stratification patient underwent Lexiscan to evaluate for reversible ischemia. He is noted to have moderate intensity, medium sized reversible perfusion defect involving the inferior and inferolateral segments with regional wall motion abnormalities and gated SPECT.  Clinically he works out 1.5 hours a day (per patient) majority of it is resistant training as his cardio is limited due to osteoarthritis of the knee.  Given the fact that the disease severity is at least medium in size and moderate intensity with multiple cardiovascular risk factors that  shared decision was to proceed with left heart catheterization prior to upcoming noncardiac surgery to rule out proximal/obstructive CAD.  Patient is agreeable with the plan of care and would like to proceed with left heart catheterization.  The procedure of left heart catheterization with possible intervention was explained to the patient in detail.   The indication, alternatives, risks and benefits were reviewed.   Complications include but not limited to bleeding, infection, vascular injury, stroke, myocardial infection, arrhythmia, kidney injury, radiation-related injury in the case of prolonged fluoroscopy use, emergency cardiac surgery, and death. The patient understands the risks of serious complication is 1-2 in 6295 with diagnostic cardiac cath and 1-2% or less with angioplasty/stenting.    The patient voices understanding and provides verbal feedback and wishes to proceed with coronary angiography with possible PCI.  Until the work-up is not complete he is recommended to take aspirin 81 mg p.o. daily.   Preoperative risk stratification:  Planning to undergo a right knee replacement with Dr. Theda Sers, tentatively scheduled for January 23, 2021.  Further recommendations to follow.  Benign essential hypertension:  Office blood pressure is not well controlled.  Patient states that his home blood pressures are very well controlled.  I educated him that it is imperative that his blood pressures are under better control prior to his upcoming noncardiac surgery.  I also encouraged him to discuss possibly weaning off of clonidine with his PCP and considering other antihypertensive medications.  As clonidine can cause conduction abnormalities and given his ECG today cannot rule out ectopic atrial rhythm and he also has 1st degree AV block and IVCD.   Moderate aortic stenosis:  Asymptomatic.  Patient denies any symptoms of angina, congestive heart failure, or syncope.  He is asked to seek medical attention if any of the 3 cardinal signs of aortic stenosis surface.  Repeat echocardiogram in July 2022.  Bilateral carotid bruits:  Carotid duplex results reviewed with the patient. He has minimal stenosis bilaterally. No additional testing needed at this time.   Hyperlipidemia:  Independently reviewed the most recent lipid profile provided by his PCP as a part of this consultation.  Patient's LDL is greater than 70 mg/dL in the setting of non-insulin-dependent diabetes mellitus type 2.  May consider up titration of statin therapy to further optimize his lipid profile.  Will defer to primary team at this time.   FINAL MEDICATION LIST END OF ENCOUNTER: Meds ordered this encounter  Medications  . aspirin EC 81 MG tablet    Sig: Take 1 tablet (81 mg total) by mouth daily.  Swallow whole.    Dispense:  90 tablet    Refill:  3    Current Outpatient Medications:  .  amLODipine (NORVASC) 10 MG tablet, Take 10 mg by mouth daily., Disp: , Rfl:  .  aspirin EC 81 MG tablet, Take 1 tablet (81 mg total) by mouth daily. Swallow whole., Disp: 90 tablet, Rfl: 3 .  atorvastatin (LIPITOR) 20 MG tablet, Take 1 tablet (20 mg total) by mouth 2 (two) times a week. (Patient taking differently: Take 20 mg by mouth See admin instructions. Take one tablet (20 mg) by mouth twice weekly - Monday and Thursday), Disp: 10 tablet, Rfl: 10 .  carvedilol (COREG) 3.125 MG tablet, Take 1 tablet (3.125 mg total) by mouth 2 (two) times daily with a meal., Disp: 60 tablet, Rfl: 11 .  cloNIDine (CATAPRES) 0.3 MG tablet, Take 0.6 mg by mouth 2 (two) times daily. , Disp: , Rfl:  .  hydrochlorothiazide (HYDRODIURIL) 25 MG tablet, Take 25 mg by mouth daily., Disp: , Rfl:  .  lisinopril (PRINIVIL,ZESTRIL) 40 MG tablet, Take 40 mg by mouth every evening., Disp: , Rfl:  .  metFORMIN (GLUCOPHAGE) 1000 MG tablet, Take 1,000 mg by mouth 2 (two) times daily with a meal., Disp: , Rfl:  .  pantoprazole (PROTONIX) 40 MG tablet, Take 40 mg by mouth daily., Disp: , Rfl:  .  acetaminophen (TYLENOL) 500 MG tablet, Take 500 mg by mouth every 8 (eight) hours as needed for moderate pain., Disp: , Rfl:   Orders Placed This Encounter  Procedures  . SARS-COV-2 RNA,(COVID-19) QUAL NAAT  . CBC  . Basic metabolic panel  . Magnesium  . Lipid Panel w/o Chol/HDL Ratio  . CBC  . Basic metabolic panel  . Lipid Panel w/o Chol/HDL Ratio  . PCV ECHOCARDIOGRAM COMPLETE    There are no Patient Instructions on file for this visit.   --Continue cardiac medications as reconciled in final medication list. --Return in about 4 weeks (around 01/31/2021) for Post heart catheterization. Or sooner if needed. --Continue follow-up with your primary care physician regarding the management of your other chronic comorbid  conditions.  Patient's questions and concerns were addressed to his satisfaction. He voices understanding of the instructions provided during this encounter.   This note was created using a voice recognition software as a result there may be grammatical errors inadvertently enclosed that do not reflect the nature of this encounter. Every attempt is made to correct such errors.  Rex Kras, [place], [facility]  Pager: [phone] Office: [phone]

## 2021-01-05 LAB — BASIC METABOLIC PANEL
BUN/Creatinine Ratio: 24 (ref 10–24)
BUN: 21 mg/dL (ref 8–27)
CO2: 23 mmol/L (ref 20–29)
Calcium: 9.9 mg/dL (ref 8.6–10.2)
Chloride: 101 mmol/L (ref 96–106)
Creatinine, Ser: 0.87 mg/dL (ref 0.76–1.27)
GFR calc Af Amer: 94 mL/min/{1.73_m2} (ref >59)
GFR calc non Af Amer: 81 mL/min/{1.73_m2} (ref >59)
Glucose: 135 mg/dL — ABNORMAL HIGH (ref 65–99)
Potassium: 4.2 mmol/L (ref 3.5–5.2)
Sodium: 140 mmol/L (ref 134–144)

## 2021-01-05 LAB — LIPID PANEL W/O CHOL/HDL RATIO
Cholesterol, Total: 183 mg/dL (ref 100–199)
HDL: 41 mg/dL (ref >39)
LDL Chol Calc (NIH): 117 mg/dL — ABNORMAL HIGH (ref 0–99)
Triglycerides: 138 mg/dL (ref 0–149)
VLDL Cholesterol Cal: 25 mg/dL (ref 5–40)

## 2021-01-05 LAB — CBC
Hematocrit: 35.5 % — ABNORMAL LOW (ref 37.5–51.0)
Hemoglobin: 12.6 g/dL — ABNORMAL LOW (ref 13.0–17.7)
MCH: 30.4 pg (ref 26.6–33.0)
MCHC: 35.5 g/dL (ref 31.5–35.7)
MCV: 86 fL (ref 79–97)
Platelets: 202 10*3/uL (ref 150–450)
RBC: 4.15 x10E6/uL (ref 4.14–5.80)
RDW: 13.1 % (ref 11.6–15.4)
WBC: 6.6 10*3/uL (ref 3.4–10.8)

## 2021-01-05 LAB — MAGNESIUM: Magnesium: 1.6 mg/dL (ref 1.6–2.3)

## 2021-01-12 ENCOUNTER — Other Ambulatory Visit (HOSPITAL_COMMUNITY)
Admission: RE | Admit: 2021-01-12 | Discharge: 2021-01-12 | Disposition: A | Discharge status: Home / Self Care | Payer: Medicare Other | Source: Ambulatory Visit | Attending: Cardiology | Admitting: Cardiology

## 2021-01-12 DIAGNOSIS — Z20822 Contact with and (suspected) exposure to covid-19: Secondary | ICD-10-CM | POA: Insufficient documentation

## 2021-01-12 DIAGNOSIS — Z01812 Encounter for preprocedural laboratory examination: Secondary | ICD-10-CM | POA: Insufficient documentation

## 2021-01-12 LAB — SARS CORONAVIRUS 2 (TAT 6-24 HRS): SARS Coronavirus 2: NEGATIVE

## 2021-01-14 DIAGNOSIS — R9439 Abnormal result of other cardiovascular function study: Secondary | ICD-10-CM | POA: Diagnosis present

## 2021-01-14 DIAGNOSIS — Z01810 Encounter for preprocedural cardiovascular examination: Secondary | ICD-10-CM

## 2021-01-15 ENCOUNTER — Ambulatory Visit (HOSPITAL_COMMUNITY)
Admission: RE | Admit: 2021-01-15 | Discharge: 2021-01-15 | Disposition: A | Discharge status: Home / Self Care | Payer: Medicare Other | Attending: Cardiology | Admitting: Cardiology

## 2021-01-15 ENCOUNTER — Other Ambulatory Visit: Payer: Self-pay

## 2021-01-15 ENCOUNTER — Encounter (HOSPITAL_COMMUNITY)
Admission: RE | Disposition: A | Discharge status: Home / Self Care | Payer: Self-pay | Source: Home / Self Care | Attending: Cardiology

## 2021-01-15 DIAGNOSIS — I350 Nonrheumatic aortic (valve) stenosis: Secondary | ICD-10-CM | POA: Diagnosis not present

## 2021-01-15 DIAGNOSIS — E782 Mixed hyperlipidemia: Secondary | ICD-10-CM | POA: Diagnosis not present

## 2021-01-15 DIAGNOSIS — Z79899 Other long term (current) drug therapy: Secondary | ICD-10-CM | POA: Insufficient documentation

## 2021-01-15 DIAGNOSIS — I2510 Atherosclerotic heart disease of native coronary artery without angina pectoris: Secondary | ICD-10-CM | POA: Diagnosis not present

## 2021-01-15 DIAGNOSIS — Z87891 Personal history of nicotine dependence: Secondary | ICD-10-CM | POA: Insufficient documentation

## 2021-01-15 DIAGNOSIS — Z01810 Encounter for preprocedural cardiovascular examination: Secondary | ICD-10-CM

## 2021-01-15 DIAGNOSIS — I10 Essential (primary) hypertension: Secondary | ICD-10-CM | POA: Diagnosis not present

## 2021-01-15 DIAGNOSIS — Z7984 Long term (current) use of oral hypoglycemic drugs: Secondary | ICD-10-CM | POA: Diagnosis not present

## 2021-01-15 DIAGNOSIS — R0989 Other specified symptoms and signs involving the circulatory and respiratory systems: Secondary | ICD-10-CM | POA: Insufficient documentation

## 2021-01-15 DIAGNOSIS — Z7982 Long term (current) use of aspirin: Secondary | ICD-10-CM | POA: Insufficient documentation

## 2021-01-15 DIAGNOSIS — R9439 Abnormal result of other cardiovascular function study: Secondary | ICD-10-CM | POA: Diagnosis present

## 2021-01-15 DIAGNOSIS — E1165 Type 2 diabetes mellitus with hyperglycemia: Secondary | ICD-10-CM | POA: Insufficient documentation

## 2021-01-15 HISTORY — PX: LEFT HEART CATH AND CORONARY ANGIOGRAPHY: CATH118249

## 2021-01-15 LAB — GLUCOSE, CAPILLARY
Glucose-Capillary: 155 mg/dL — ABNORMAL HIGH (ref 70–99)
Glucose-Capillary: 115 mg/dL — ABNORMAL HIGH (ref 70–99)

## 2021-01-15 SURGERY — LEFT HEART CATH AND CORONARY ANGIOGRAPHY
Anesthesia: LOCAL

## 2021-01-15 MED ORDER — ASPIRIN 81 MG PO CHEW
81.0000 mg | CHEWABLE_TABLET | ORAL | Status: AC
  Administered 2021-01-15: 81 mg via ORAL
  Filled 2021-01-15: qty 1

## 2021-01-15 MED ORDER — FENTANYL CITRATE (PF) 100 MCG/2ML IJ SOLN
INTRAMUSCULAR | Status: DC | PRN
  Administered 2021-01-15: 25 ug via INTRAVENOUS

## 2021-01-15 MED ORDER — SODIUM CHLORIDE 0.9 % IV SOLN: 250.0000 mL | INTRAVENOUS | Status: DC | PRN

## 2021-01-15 MED ORDER — MIDAZOLAM HCL 2 MG/2ML IJ SOLN
INTRAMUSCULAR | Status: DC | PRN
  Administered 2021-01-15: 2 mg via INTRAVENOUS

## 2021-01-15 MED ORDER — HEPARIN (PORCINE) IN NACL 1000-0.9 UT/500ML-% IV SOLN
INTRAVENOUS | Status: DC | PRN
  Administered 2021-01-15 (×2): 500 mL

## 2021-01-15 MED ORDER — ONDANSETRON HCL 4 MG/2ML IJ SOLN: 4.0000 mg | Freq: Four times a day (QID) | INTRAMUSCULAR | Status: DC | PRN

## 2021-01-15 MED ORDER — HYDRALAZINE HCL 20 MG/ML IJ SOLN
10.0000 mg | INTRAMUSCULAR | Status: DC | PRN
  Administered 2021-01-15: 10 mg via INTRAVENOUS

## 2021-01-15 MED ORDER — SODIUM CHLORIDE 0.9 % WEIGHT BASED INFUSION: 1.0000 mL/kg/h | INTRAVENOUS | Status: DC

## 2021-01-15 MED ORDER — MIDAZOLAM HCL 2 MG/2ML IJ SOLN
INTRAMUSCULAR | Status: AC
  Filled 2021-01-15: qty 2

## 2021-01-15 MED ORDER — VERAPAMIL HCL 2.5 MG/ML IV SOLN
INTRAVENOUS | Status: DC | PRN
  Administered 2021-01-15: 5 mL via INTRA_ARTERIAL

## 2021-01-15 MED ORDER — HEPARIN SODIUM (PORCINE) 1000 UNIT/ML IJ SOLN
INTRAMUSCULAR | Status: DC | PRN
  Administered 2021-01-15: 5000 [IU] via INTRAVENOUS

## 2021-01-15 MED ORDER — FENTANYL CITRATE (PF) 100 MCG/2ML IJ SOLN
INTRAMUSCULAR | Status: AC
  Filled 2021-01-15: qty 2

## 2021-01-15 MED ORDER — SODIUM CHLORIDE 0.9% FLUSH: 3.0000 mL | INTRAVENOUS | Status: DC | PRN

## 2021-01-15 MED ORDER — IOHEXOL 350 MG/ML SOLN
INTRAVENOUS | Status: DC | PRN
  Administered 2021-01-15: 50 mL

## 2021-01-15 MED ORDER — SODIUM CHLORIDE 0.9 % WEIGHT BASED INFUSION
3.0000 mL/kg/h | INTRAVENOUS | Status: DC
  Administered 2021-01-15: 3 mL/kg/h via INTRAVENOUS

## 2021-01-15 MED ORDER — HYDRALAZINE HCL 20 MG/ML IJ SOLN
INTRAMUSCULAR | Status: AC
  Filled 2021-01-15: qty 1

## 2021-01-15 MED ORDER — SODIUM CHLORIDE 0.9% FLUSH: 3.0000 mL | Freq: Two times a day (BID) | INTRAVENOUS | Status: DC

## 2021-01-15 MED ORDER — ACETAMINOPHEN 325 MG PO TABS: 650.0000 mg | ORAL_TABLET | ORAL | Status: DC | PRN

## 2021-01-15 MED ORDER — SODIUM CHLORIDE 0.9 % IV SOLN
250.0000 mL | INTRAVENOUS | Status: DC | PRN
Start: 2021-01-15 — End: 2021-01-15

## 2021-01-15 MED ORDER — LIDOCAINE HCL (PF) 1 % IJ SOLN
INTRAMUSCULAR | Status: AC
  Filled 2021-01-15: qty 30

## 2021-01-15 MED ORDER — VERAPAMIL HCL 2.5 MG/ML IV SOLN
INTRAVENOUS | Status: AC
  Filled 2021-01-15: qty 2

## 2021-01-15 MED ORDER — HEPARIN SODIUM (PORCINE) 1000 UNIT/ML IJ SOLN
INTRAMUSCULAR | Status: AC
  Filled 2021-01-15: qty 1

## 2021-01-15 MED ORDER — HEPARIN (PORCINE) IN NACL 1000-0.9 UT/500ML-% IV SOLN
INTRAVENOUS | Status: AC
  Filled 2021-01-15: qty 1000

## 2021-01-15 MED ORDER — LIDOCAINE HCL (PF) 1 % IJ SOLN
INTRAMUSCULAR | Status: DC | PRN
  Administered 2021-01-15: 2 mL

## 2021-01-15 SURGICAL SUPPLY — 10 items
CATH OPTITORQUE TIG 4.0 5F (CATHETERS) ×2 IMPLANT
DEVICE RAD COMP TR BAND LRG (VASCULAR PRODUCTS) ×2 IMPLANT
GLIDESHEATH SLEND A-KIT 6F 22G (SHEATH) ×2 IMPLANT
GUIDEWIRE ANGLED .035X150CM (WIRE) ×2 IMPLANT
GUIDEWIRE INQWIRE 1.5J.035X260 (WIRE) ×1 IMPLANT
INQWIRE 1.5J .035X260CM (WIRE) ×2
KIT HEART LEFT (KITS) ×2 IMPLANT
PACK CARDIAC CATHETERIZATION (CUSTOM PROCEDURE TRAY) ×2 IMPLANT
TRANSDUCER W/STOPCOCK (MISCELLANEOUS) ×2 IMPLANT
TUBING CIL FLEX 10 FLL-RA (TUBING) ×2 IMPLANT

## 2021-01-15 NOTE — Interval H&P Note (Signed)
History and Physical Interval Note:  01/15/2021 10:58 AM  Nicholas Shelton  has presented today for surgery, with the diagnosis of HTN.  The various methods of treatment have been discussed with the patient and family. After consideration of risks, benefits and other options for treatment, the patient has consented to  Procedure(s): LEFT HEART CATH AND CORONARY ANGIOGRAPHY (N/A) and possible angioplasty as a surgical intervention.  The patient's history has been reviewed, patient examined, no change in status, stable for surgery.  I have reviewed the patient's chart and labs.  Questions were answered to the patient's satisfaction.   Cath Lab Visit (complete for each Cath Lab visit)  Clinical Evaluation Leading to the Procedure:   ACS: No.  Non-ACS:    Anginal Classification: CCS II  Anti-ischemic medical therapy: Maximal Therapy (2 or more classes of medications)  Non-Invasive Test Results: High-risk stress test findings: cardiac mortality >3%/year  Prior CABG: No previous CABG  Adrian Prows

## 2021-01-15 NOTE — Progress Notes (Signed)
Ambulated in hallway and to the bathroom to void tol well no bleeding noted before or after ambulation.

## 2021-01-15 NOTE — Progress Notes (Signed)
Discharge instructions reviewed with pt and his sister in law (via Telephone) both voice understanding.

## 2021-01-15 NOTE — Progress Notes (Signed)
BP cuff off rt forearm at 1220. Rt forearm level 0. Keeping rt arm elevated on pillow.

## 2021-01-15 NOTE — Discharge Instructions (Signed)
Radial Site Care  This sheet gives you information about how to care for yourself after your procedure. Your health care provider may also give you more specific instructions. If you have problems or questions, contact your health care provider. What can I expect after the procedure? After the procedure, it is common to have:  Bruising and tenderness at the catheter insertion area. Follow these instructions at home: Medicines  Take over-the-counter and prescription medicines only as told by your health care provider. Insertion site care 1. Follow instructions from your health care provider about how to take care of your insertion site. Make sure you: ? Wash your hands with soap and water before you remove your bandage (dressing). If soap and water are not available, use hand sanitizer. ? May remove dressing in 24 hours. 2. Check your insertion site every day for signs of infection. Check for: ? Redness, swelling, or pain. ? Fluid or blood. ? Pus or a bad smell. ? Warmth. 3. Do no take baths, swim, or use a hot tub for 5 days. 4. You may shower 24-48 hours after the procedure. ? Remove the dressing and gently wash the site with plain soap and water. ? Pat the area dry with a clean towel. ? Do not rub the site. That could cause bleeding. 5. Do not apply powder or lotion to the site. Activity  1. For 24 hours after the procedure, or as directed by your health care provider: ? Do not flex or bend the affected arm. ? Do not push or pull heavy objects with the affected arm. ? Do not drive yourself home from the hospital or clinic. You may drive 24 hours after the procedure. ? Do not operate machinery or power tools. ? KEEP ARM ELEVATED THE REMAINDER OF THE DAY. 2. Do not push, pull or lift anything that is heavier than 10 lb for 5 days. 3. Ask your health care provider when it is okay to: ? Return to work or school. ? Resume usual physical activities or sports. ? Resume sexual  activity. General instructions  If the catheter site starts to bleed, raise your arm and put firm pressure on the site. If the bleeding does not stop, get help right away. This is a medical emergency.  DRINK PLENTY OF FLUIDS FOR THE NEXT 2-3 DAYS.  No alcohol consumption for 24 hours after receiving sedation.  If you went home on the same day as your procedure, a responsible adult should be with you for the first 24 hours after you arrive home.  Keep all follow-up visits as told by your health care provider. This is important. Contact a health care provider if:  You have a fever.  You have redness, swelling, or yellow drainage around your insertion site. Get help right away if:  You have unusual pain at the radial site.  The catheter insertion area swells very fast.  The insertion area is bleeding, and the bleeding does not stop when you hold steady pressure on the area.  Your arm or hand becomes pale, cool, tingly, or numb. These symptoms may represent a serious problem that is an emergency. Do not wait to see if the symptoms will go away. Get medical help right away. Call your local emergency services (911 in the [place] Do not drive yourself to the hospital. Summary  After the procedure, it is common to have bruising and tenderness at the site.  Follow instructions from your health care provider about how to take care   of your radial site wound. Check the wound every day for signs of infection.  This information is not intended to replace advice given to you by your health care provider. Make sure you discuss any questions you have with your health care provider. Document Revised: 12/02/2017 Document Reviewed: 12/02/2017 Elsevier Patient Education  2020 [employer] 

## 2021-01-16 ENCOUNTER — Encounter (HOSPITAL_COMMUNITY): Payer: Self-pay | Admitting: Cardiology

## 2021-01-16 ENCOUNTER — Encounter (HOSPITAL_COMMUNITY): Admission: RE | Admit: 2021-01-16 | Payer: Medicare Other | Source: Ambulatory Visit

## 2021-01-22 ENCOUNTER — Telehealth: Payer: Self-pay

## 2021-01-22 NOTE — Progress Notes (Signed)
Sent message, via epic in basket, requesting orders in epic from surgeon.  

## 2021-01-22 NOTE — H&P (View-Only) (Signed)
Sent message, via epic in basket, requesting orders in epic from surgeon.  

## 2021-01-22 NOTE — Telephone Encounter (Signed)
Patient's wife called wondering if patient needs to be on Aspirin he currently isn't. Patient is scheduled for his knee surgery on March 30 and they are wondering is he needs to be on it or off it please advise

## 2021-01-22 NOTE — Telephone Encounter (Signed)
He can stop it.  Have him push his appt back to 6 weeks post-surgery.

## 2021-01-22 NOTE — Telephone Encounter (Signed)
Patient's wife was forward to front desk but she wasn't' able to reschedule due to her husband (patient) not being there they will call back whenever they can

## 2021-01-23 ENCOUNTER — Ambulatory Visit: Admit: 2021-01-23 | Payer: Medicare Other | Admitting: Specialist

## 2021-01-23 SURGERY — ARTHROPLASTY, KNEE, TOTAL
Anesthesia: Spinal | Site: Knee | Laterality: Right

## 2021-01-29 NOTE — Patient Instructions (Addendum)
DUE TO COVID-19 ONLY ONE VISITOR IS ALLOWED TO COME WITH YOU AND STAY IN THE WAITING ROOM ONLY DURING PRE OP AND PROCEDURE DAY OF SURGERY. THE 1 VISITOR  MAY VISIT WITH YOU AFTER SURGERY IN YOUR PRIVATE ROOM DURING VISITING HOURS ONLY!  YOU NEED TO HAVE A COVID 19 TEST ON__3/26_____ @_9 :10_____, THIS TEST MUST BE DONE BEFORE SURGERY,  COVID TESTING SITE [place] [id number], IT IS ON THE RIGHT GOING OUT [address] APPROXIMATELY  2 MINUTES PAST [employer] ON THE RIGHT. ONCE YOUR COVID TEST IS COMPLETED,  PLEASE BEGIN THE QUARANTINE INSTRUCTIONS AS OUTLINED IN YOUR HANDOUT.                [name]   Your procedure is scheduled on: 02/06/21   Report to [facility] Main  Entrance   Report to Short stay at 5:30 AM     Call this number if you have problems the morning of surgery [place], NO CHEWING GUM [place].     Take these medicines the morning of surgery with A SIP OF WATER: Catapres, Carvedilol, Amlodipine, Pantoprazole  DO NOT TAKE ANY DIABETIC MEDICATIONS DAY OF YOUR SURGERY                               You may not have any metal on your body including               piercings  Do not wear jewelry,  lotions, powders or deodorant              Men may shave face and neck.   Do not bring valuables to the hospital. [place].  Contacts, dentures or bridgework may not be worn into surgery.      Patients discharged the day of surgery will not be allowed to drive home.  IF YOU ARE HAVING SURGERY AND GOING HOME THE SAME DAY, YOU MUST HAVE AN ADULT TO DRIVE YOU HOME AND BE WITH YOU FOR 24 HOURS.  YOU MAY GO HOME BY TAXI OR UBER OR ORTHERWISE, BUT AN ADULT MUST ACCOMPANY YOU HOME AND STAY WITH YOU FOR 24 HOURS.  Name and phone number of your driver:  Special Instructions: N/A              Please read over the  following fact sheets you were given: _____________________________________________________________________             [facility] - Preparing for Surgery Before surgery, you can play an important role.  Because skin is not sterile, your skin needs to be as free of germs as possible.  You can reduce the number of germs on your skin by washing with CHG (chlorahexidine gluconate) soap before surgery.  CHG is an antiseptic cleaner which kills germs and bonds with the skin to continue killing germs even after washing. Please DO NOT use if you have an allergy to CHG or antibacterial soaps.  If your skin becomes reddened/irritated stop using the CHG and inform your nurse when you arrive at Short Stay.   You may shave your face/neck.  Please follow these instructions carefully:  1.  Shower with CHG Soap the night before surgery and the  morning of Surgery.  2.  If you choose to wash your hair, wash your hair first as usual with your  normal  shampoo.  3.  After you shampoo, rinse your hair and body thoroughly to remove the  shampoo.                                        4.  Use CHG as you would any other liquid soap.  You can apply chg directly  to the skin and wash                       Gently with a scrungie or clean washcloth.  5.  Apply the CHG Soap to your body ONLY FROM THE NECK DOWN.   Do not use on face/ open                           Wound or open sores. Avoid contact with eyes, ears mouth and genitals (private parts).                       Wash face,  Genitals (private parts) with your normal soap.             6.  Wash thoroughly, paying special attention to the area where your surgery  will be performed.  7.  Thoroughly rinse your body with warm water from the neck down.  8.  DO NOT shower/wash with your normal soap after using and rinsing off  the CHG Soap.             9.  Pat yourself dry with a clean towel.            10.  Wear clean pajamas.            11.  Place clean sheets on  your bed the night of your first shower and do not  sleep with pets. Day of Surgery : Do not apply any lotions/deodorants the morning of surgery.  Please wear clean clothes to the hospital/surgery center.  FAILURE TO FOLLOW THESE INSTRUCTIONS MAY RESULT IN THE CANCELLATION OF YOUR SURGERY PATIENT SIGNATURE_________________________________  NURSE SIGNATURE__________________________________  ________________________________________________________________________   Adam Phenix  An incentive spirometer is a tool that can help keep your lungs clear and active. This tool measures how well you are filling your lungs with each breath. Taking long deep breaths may help reverse or decrease the chance of developing breathing (pulmonary) problems (especially infection) following:  A long period of time when you are unable to move or be active. BEFORE THE PROCEDURE   If the spirometer includes an indicator to show your best effort, your nurse or respiratory therapist will set it to a desired goal.  If possible, sit up straight or lean slightly forward. Try not to slouch.  Hold the incentive spirometer in an upright position. INSTRUCTIONS FOR USE  1. Sit on the edge of your bed if possible, or sit up as far as you can in bed or on a chair. 2. Hold the incentive spirometer in an upright position. 3. Breathe out normally. 4. Place the mouthpiece in your mouth and seal your lips tightly around it. 5. Breathe in slowly and as deeply as possible, raising the piston or the ball toward the top of the column. 6. Hold your  breath for 3-5 seconds or for as long as possible. Allow the piston or ball to fall to the bottom of the column. 7. Remove the mouthpiece from your mouth and breathe out normally. 8. Rest for a few seconds and repeat Steps 1 through 7 at least 10 times every 1-2 hours when you are awake. Take your time and take a few normal breaths between deep breaths. 9. The spirometer may  include an indicator to show your best effort. Use the indicator as a goal to work toward during each repetition. 10. After each set of 10 deep breaths, practice coughing to be sure your lungs are clear. If you have an incision (the cut made at the time of surgery), support your incision when coughing by placing a pillow or rolled up towels firmly against it. Once you are able to get out of bed, walk around indoors and cough well. You may stop using the incentive spirometer when instructed by your caregiver.  RISKS AND COMPLICATIONS  Take your time so you do not get dizzy or light-headed.  If you are in pain, you may need to take or ask for pain medication before doing incentive spirometry. It is harder to take a deep breath if you are having pain. AFTER USE  Rest and breathe slowly and easily.  It can be helpful to keep track of a log of your progress. Your caregiver can provide you with a simple table to help with this. If you are using the spirometer at home, follow these instructions: [place] IF:   You are having difficultly using the spirometer.  You have trouble using the spirometer as often as instructed.  Your pain medication is not giving enough relief while using the spirometer.  You develop fever of 100.5 F (38.1 C) or higher. SEEK IMMEDIATE MEDICAL CARE IF:   You cough up bloody sputum that had not been present before.  You develop fever of 102 F (38.9 C) or greater.  You develop worsening pain at or near the incision site. MAKE SURE YOU:   Understand these instructions.  Will watch your condition.  Will get help right away if you are not doing well or get worse. Document Released: 03/09/2007 Document Revised: 01/19/2012 Document Reviewed: 05/10/2007 [facility] Patient Information 2014 [place], [place].   ________________________________________________________________________

## 2021-01-30 ENCOUNTER — Other Ambulatory Visit: Payer: Self-pay

## 2021-01-30 ENCOUNTER — Encounter (HOSPITAL_COMMUNITY)
Admission: RE | Admit: 2021-01-30 | Discharge: 2021-01-30 | Disposition: A | Discharge status: Home / Self Care | Payer: Medicare Other | Source: Ambulatory Visit | Attending: Specialist | Admitting: Specialist

## 2021-01-30 ENCOUNTER — Encounter (HOSPITAL_COMMUNITY): Payer: Self-pay

## 2021-01-30 DIAGNOSIS — Z01812 Encounter for preprocedural laboratory examination: Secondary | ICD-10-CM | POA: Insufficient documentation

## 2021-01-30 LAB — SURGICAL PCR SCREEN
MRSA, PCR: NEGATIVE
Staphylococcus aureus: NEGATIVE

## 2021-01-30 LAB — BASIC METABOLIC PANEL
Anion gap: 9 (ref 5–15)
BUN: 25 mg/dL — ABNORMAL HIGH (ref 8–23)
CO2: 23 mmol/L (ref 22–32)
Calcium: 9.3 mg/dL (ref 8.9–10.3)
Chloride: 108 mmol/L (ref 98–111)
Creatinine, Ser: 0.9 mg/dL (ref 0.61–1.24)
GFR, Estimated: >60 mL/min (ref >60)
Glucose, Bld: 166 mg/dL — ABNORMAL HIGH (ref 70–99)
Potassium: 4.2 mmol/L (ref 3.5–5.1)
Sodium: 140 mmol/L (ref 135–145)

## 2021-01-30 LAB — CBC
HCT: 32.3 % — ABNORMAL LOW (ref 39.0–52.0)
Hemoglobin: 10.9 g/dL — ABNORMAL LOW (ref 13.0–17.0)
MCH: 30.1 pg (ref 26.0–34.0)
MCHC: 33.7 g/dL (ref 30.0–36.0)
MCV: 89.2 fL (ref 80.0–100.0)
Platelets: 170 10*3/uL (ref 150–400)
RBC: 3.62 MIL/uL — ABNORMAL LOW (ref 4.22–5.81)
RDW: 13.6 % (ref 11.5–15.5)
WBC: 6.6 10*3/uL (ref 4.0–10.5)
nRBC: 0 % (ref 0.0–0.2)

## 2021-01-30 LAB — HEMOGLOBIN A1C
Hgb A1c MFr Bld: 7 % — ABNORMAL HIGH (ref 4.8–5.6)
Mean Plasma Glucose: 154.2 mg/dL

## 2021-01-30 LAB — GLUCOSE, CAPILLARY: Glucose-Capillary: 159 mg/dL — ABNORMAL HIGH (ref 70–99)

## 2021-01-30 NOTE — Progress Notes (Signed)
COVID Vaccine Completed:yes Date COVID Vaccine completed:02/04/20 COVID vaccine manufacturer: [place]      PCP - Dr. Tillman Sers Cardiologist - Dr. Christen Butter  Chest x-ray - no EKG -01/15/21-epic  Stress Test - 12/25/20-epic ECHO - 01/03/21-epic Cardiac Cath - 01/15/21-epic Pacemaker/ICD device last checked:NA  Sleep Study - no CPAP -   Fasting Blood Sugar - 115-155 Checks Blood Sugar _QD____ times a day  Blood Thinner Instructions:NA Aspirin Instructions: Last Dose:  Anesthesia review:   Patient denies shortness of breath, fever, cough and chest pain at PAT appointment  Yes Patient verbalized understanding of instructions that were given to them at the PAT appointment. Patient was also instructed that they will need to review over the PAT instructions again at home before surgery.Yes Pt works out daily. He has no SOB with any activities.

## 2021-01-31 ENCOUNTER — Ambulatory Visit: Payer: Medicare Other | Admitting: Cardiology

## 2021-01-31 NOTE — Progress Notes (Signed)
Anesthesia Chart Review   Case: [id number] Date/Time: 02/06/21 0715   Procedure: TOTAL KNEE ARTHROPLASTY (Right Knee) - with adductor canal   Anesthesia type: Spinal   Pre-op diagnosis: Right knee osteoarthritis   Location: [facility] ROOM 04 / [facility]   Surgeons: Sydnee Cabal, MD      DISCUSSION:79 y.o. former smoker with h/o HTN, DM II, AS (mean gradient 26.6 mmHg, AVA 1.1cm2 on Echo 12/06/20, mild AS noted on cardiac cath), right knee oa scheduled for above procedure 02/06/2021 with Dr. Sydnee Cabal.   Pt seen by cardiology for preop evaluation 12/13/20. Stress Test ordered at this time which was performed 12/24/20, high risk study.  Pt underwent a subsequent cardiac cath 01/15/2021 with no significant coronary disease, normal LVEF and mild aortic stenosis.   Anticipate pt can proceed with planned procedure barring acute status change.   VS: BP (!) 166/67   Pulse (!) 52   Temp 36.8 C (Oral)   Ht 5\' 10"  (1.778 m)   Wt 89.8 kg   SpO2 100%   BMI 28.41 kg/m   PROVIDERS: Christain Sacramento, MD is PCP   Adrian Prows, MD is Cardiologist  LABS: Labs reviewed: Acceptable for surgery. (all labs ordered are listed, but only abnormal results are displayed)  Labs Reviewed  HEMOGLOBIN A1C - Abnormal; Notable for the following components:      Result Value   Hgb A1c MFr Bld 7.0 (*)    All other components within normal limits  BASIC METABOLIC PANEL - Abnormal; Notable for the following components:   Glucose, Bld 166 (*)    BUN 25 (*)    All other components within normal limits  CBC - Abnormal; Notable for the following components:   RBC 3.62 (*)    Hemoglobin 10.9 (*)    HCT 32.3 (*)    All other components within normal limits  GLUCOSE, CAPILLARY - Abnormal; Notable for the following components:   Glucose-Capillary 159 (*)    All other components within normal limits  SURGICAL PCR SCREEN     IMAGES:   EKG: 01/15/2021 Rate 64 bpm  Sinus rhythm with 1st degree A-V block Non-specific  intra-ventricular conduction delay Minimal voltage criteria for LVH, may be normal variant U waves present Nonspecific T wave abnormality Abnormal ECG No significant change since last tracing  CV: Cardiac Cath 01/15/2021 Left heart catheterization 01/15/2021: LV: Normal LV systolic function.  EDP mildly elevated at 20 mmHg.  There is 22 mmHg peak to peak pressure gradient across the aortic valve.  Findings are consistent with mild aortic stenosis. Very mild coronary calcification and minimal luminal irregularity in the coronary vessels.  Right dominant circulation.  Impression: No significant coronary disease angiography.  Coronary calcification is evident.  Normal LVEF and mild aortic stenosis. 50 mL contrast used.  Echocardiogram 12/06/2020:  Normal LV systolic function with visual EF 55-60%. Left ventricle cavity  is normal in size. Moderate to severe concentric hypertrophy of the left  ventricle. Normal global wall motion. Doppler evidence of grade II   diastolic dysfunction, elevated LAP.  Left atrial cavity is severely dilated.  Moderate aortic valve leaflet thickening with moderate calcification.  Moderately restricted aortic valve leaflets. Moderate aortic stenosis.  Peak velocity 3.61m/s, Peak Gradient 47.5 mmHg, Mean Gradient 26.6 mmHg,  AVA 1.1cm, DI 0.3.  Mild (Grade I) mitral regurgitation.  Mild tricuspid regurgitation. Mild pulmonary hypertension. RVSP measures  37 mmHg.  IVC is dilated with a respiratory response of >50%.  Prior study dated  11/26/2017: LVEF 60-65%, mild LVH, Grade 2 DD, Mild AS,  mildly dilated LA, PASP 49mmHg.  Past Medical History:  Diagnosis Date  . Abnormal blood chemistry test   . Actinic keratosis   . Arthritis   . AV block   . Blood transfusion without reported diagnosis   . Bradycardia   . Degeneration of lumbar or lumbosacral intervertebral disc   . Deviated nasal septum   . Diabetes mellitus without complication ([place])   .  Gastroesophageal reflux disease without esophagitis   . Generalized anxiety disorder   . Gout with tophus   . Headache   . Hearing loss   . Hyperlipidemia   . Hypertension   . Insomnia   . Ischemia   . Murmur   . Nasal turbinate hypertrophy   . Osteoarthritis of wrist   . PAC (premature atrial contraction)   . Peptic ulcer   . Peripheral neuropathy   . PVC (premature ventricular contraction)   . Seasonal allergic rhinitis due to pollen     Past Surgical History:  Procedure Laterality Date  . COLONOSCOPY    . ELBOW SURGERY     pinched nerve on both arms  . INGUINAL HERNIA REPAIR Right 10/30/2018   Procedure: OPEN REPAIR OF INCARCERATED RIGHT INGUINAL HERNIA WITH MESH;  Surgeon: Greer Pickerel, MD;  Location: [place];  Service: General;  Laterality: Right;  . LEFT HEART CATH AND CORONARY ANGIOGRAPHY N/A 01/15/2021   Procedure: LEFT HEART CATH AND CORONARY ANGIOGRAPHY;  Surgeon: Adrian Prows, MD;  Location: [place] CV LAB;  Service: Cardiovascular;  Laterality: N/A;  . WRIST SURGERY     pinched nerve    MEDICATIONS: . acetaminophen (TYLENOL) 500 MG tablet  . amLODipine (NORVASC) 10 MG tablet  . aspirin EC 81 MG tablet  . atorvastatin (LIPITOR) 20 MG tablet  . carvedilol (COREG) 3.125 MG tablet  . cloNIDine (CATAPRES) 0.3 MG tablet  . hydrochlorothiazide (HYDRODIURIL) 25 MG tablet  . lisinopril (PRINIVIL,ZESTRIL) 40 MG tablet  . metFORMIN (GLUCOPHAGE) 1000 MG tablet  . pantoprazole (PROTONIX) 40 MG tablet   No current facility-administered medications for this encounter.    Konrad Felix, PA-C [facility]Surgical Testing [phone]

## 2021-02-01 NOTE — H&P (Signed)
TOTAL KNEE ADMISSION H&P  Patient is being admitted for right total knee arthroplasty.  Subjective:  Chief Complaint:right knee pain.  HPI: Nicholas Shelton, 80 y.o. male, has a history of pain and functional disability in the right knee due to arthritis and has failed non-surgical conservative treatments for greater than 12 weeks to includeNSAID's and/or analgesics, corticosteriod injections, viscosupplementation injections, flexibility and strengthening excercises, use of assistive devices and activity modification.  Onset of symptoms was gradual, starting 10 years ago with gradually worsening course since that time. The patient noted no past surgery on the right knee(s).  Patient currently rates pain in the right knee(s) at 6 out of 10 with activity. Patient has night pain and pain with passive range of motion.  Patient has evidence of subchondral cysts, subchondral sclerosis, periarticular osteophytes and joint space narrowing by imaging studies. This patient has had no previous injury. There is no active infection.  Patient Active Problem List   Diagnosis Date Noted  . Abnormal nuclear stress test 01/14/2021  . Preoperative cardiovascular examination: Left knee arthroplasty 01/23/2021 by Dr. Theda Sers 01/14/2021  . Incarcerated inguinal hernia 10/30/2018   Past Medical History:  Diagnosis Date  . Abnormal blood chemistry test   . Actinic keratosis   . Arthritis   . AV block   . Blood transfusion without reported diagnosis   . Bradycardia   . Degeneration of lumbar or lumbosacral intervertebral disc   . Deviated nasal septum   . Diabetes mellitus without complication ([place])   . Gastroesophageal reflux disease without esophagitis   . Generalized anxiety disorder   . Gout with tophus   . Headache   . Hearing loss   . Hyperlipidemia   . Hypertension   . Insomnia   . Ischemia   . Murmur   . Nasal turbinate hypertrophy   . Osteoarthritis of wrist   . PAC (premature atrial contraction)    . Peptic ulcer   . Peripheral neuropathy   . PVC (premature ventricular contraction)   . Seasonal allergic rhinitis due to pollen     Past Surgical History:  Procedure Laterality Date  . COLONOSCOPY    . ELBOW SURGERY     pinched nerve on both arms  . INGUINAL HERNIA REPAIR Right 10/30/2018   Procedure: OPEN REPAIR OF INCARCERATED RIGHT INGUINAL HERNIA WITH MESH;  Surgeon: Greer Pickerel, MD;  Location: [place];  Service: General;  Laterality: Right;  . LEFT HEART CATH AND CORONARY ANGIOGRAPHY N/A 01/15/2021   Procedure: LEFT HEART CATH AND CORONARY ANGIOGRAPHY;  Surgeon: Adrian Prows, MD;  Location: [place] [facility];  Service: Cardiovascular;  Laterality: N/A;  . WRIST SURGERY     pinched nerve    No current facility-administered medications for this encounter.   Current Outpatient Medications  Medication Sig Dispense Refill Last Dose  . acetaminophen (TYLENOL) 500 MG tablet Take 500 mg by mouth every 8 (eight) hours as needed for moderate pain.     Marland Kitchen amLODipine (NORVASC) 10 MG tablet Take 10 mg by mouth daily.     Marland Kitchen aspirin EC 81 MG tablet Take 1 tablet (81 mg total) by mouth daily. Swallow whole. (Patient taking differently: Take 81 mg by mouth 2 (two) times a week. Swallow whole.) 90 tablet 3   . atorvastatin (LIPITOR) 20 MG tablet Take 1 tablet (20 mg total) by mouth 2 (two) times a week. (Patient taking differently: Take 20 mg by mouth See admin instructions. Take one tablet (20 mg) by mouth twice weekly -  Monday and Thursday) 10 tablet 10   . carvedilol (COREG) 3.125 MG tablet Take 1 tablet (3.125 mg total) by mouth 2 (two) times daily with a meal. 60 tablet 11   . cloNIDine (CATAPRES) 0.3 MG tablet Take 0.6 mg by mouth 2 (two) times daily.      . hydrochlorothiazide (HYDRODIURIL) 25 MG tablet Take 25 mg by mouth daily.     Marland Kitchen lisinopril (PRINIVIL,ZESTRIL) 40 MG tablet Take 40 mg by mouth every evening.     . metFORMIN (GLUCOPHAGE) 1000 MG tablet Take 1 tablet (1,000 mg total) by mouth  2 (two) times daily with a meal.     . pantoprazole (PROTONIX) 40 MG tablet Take 40 mg by mouth daily.      No Known Allergies  Social History   Tobacco Use  . Smoking status: Former Smoker    Packs/day: 0.50    Years: 15.00    Pack years: 7.50    Types: Cigarettes    Quit date: 1996    Years since quitting: 26.2  . Smokeless tobacco: Never Used  Substance Use Topics  . Alcohol use: No    Family History  Problem Relation Age of Onset  . Heart attack Mother      Review of Systems  HENT: Positive for hearing loss.   All other systems reviewed and are negative.   Objective:  Physical Exam Vitals reviewed.  Constitutional:      Appearance: Normal appearance. He is normal weight.  HENT:     Head: Normocephalic and atraumatic.  Eyes:     Extraocular Movements: Extraocular movements intact.  Neck:     Vascular: No carotid bruit.  Cardiovascular:     Rate and Rhythm: Normal rate and regular rhythm.     Pulses: Normal pulses.     Heart sounds: Normal heart sounds.  Musculoskeletal:        General: Tenderness (medial and lateral joint line) and deformity present. No swelling.     Cervical back: Normal range of motion and neck supple.  Skin:    General: Skin is warm and dry.     Capillary Refill: Capillary refill takes less than 2 seconds.  Neurological:     General: No focal deficit present.     Mental Status: He is alert and oriented to person, place, and time.  Psychiatric:        Mood and Affect: Mood normal.        Behavior: Behavior normal.        Thought Content: Thought content normal.        Judgment: Judgment normal.     Vital signs in last 24 hours:    Labs:   Estimated body mass index is 28.41 kg/m as calculated from the following:   Height as of 01/30/21: 5\' 10"  (1.778 m).   Weight as of 01/30/21: 89.8 kg.   Imaging Review Plain radiographs demonstrate severe degenerative joint disease of the right knee(s). The overall alignment issignificant  valgus. The bone quality appears to be fair for age and reported activity level.      Assessment/Plan:  End stage arthritis, right knee   The patient history, physical examination, clinical judgment of the provider and imaging studies are consistent with end stage degenerative joint disease of the right knee(s) and total knee arthroplasty is deemed medically necessary. The treatment options including medical management, injection therapy arthroscopy and arthroplasty were discussed at length. The risks and benefits of total knee arthroplasty were presented  and reviewed. The risks due to aseptic loosening, infection, stiffness, patella tracking problems, thromboembolic complications and other imponderables were discussed. The patient acknowledged the explanation, agreed to proceed with the plan and consent was signed. Patient is being admitted for inpatient treatment for surgery, pain control, PT, OT, prophylactic antibiotics, VTE prophylaxis, progressive ambulation and ADL's and discharge planning. The patient is planning to be discharged home with home health services.  He will need to be set up for HHPT He will start ASA 81 mg twice a day starting when he gets home for DVT prophylaxis.      Patient's anticipated LOS is less than 2 midnights, meeting these requirements: - Younger than 16 - Lives within 1 hour of care - Has a competent adult at home to recover with post-op recover - NO history of  - Chronic pain requiring opiods  - Diabetes  - Coronary Artery Disease  - Heart failure  - Heart attack  - Stroke  - DVT/VTE  - Cardiac arrhythmia  - Respiratory Failure/COPD  - Renal failure  - Anemia  - Advanced Liver disease

## 2021-02-02 ENCOUNTER — Other Ambulatory Visit (HOSPITAL_COMMUNITY)
Admission: RE | Admit: 2021-02-02 | Discharge: 2021-02-02 | Disposition: A | Discharge status: Home / Self Care | Payer: Medicare Other | Source: Ambulatory Visit | Attending: Specialist | Admitting: Specialist

## 2021-02-02 DIAGNOSIS — Z01812 Encounter for preprocedural laboratory examination: Secondary | ICD-10-CM | POA: Diagnosis present

## 2021-02-02 DIAGNOSIS — Z20822 Contact with and (suspected) exposure to covid-19: Secondary | ICD-10-CM | POA: Diagnosis not present

## 2021-02-02 LAB — SARS CORONAVIRUS 2 (TAT 6-24 HRS): SARS Coronavirus 2: NEGATIVE

## 2021-02-04 NOTE — Progress Notes (Deleted)
Date:  02/04/2021   ID:  Nicholas Shelton, DOB Feb 28, 1941, MRN [id number]  PCP:  Christain Sacramento, MD  Cardiologist:  Roslyn Smiling, DO, [facility] (established care 12/13/2020) Cardiac Electrophysiologist: Dr. Virl Axe.   Date: *** Last Office Visit: ***  No chief complaint on file.   HPI  Nicholas Shelton is a 80 y.o. male who presents to the office with a chief complaint of "***." Patient's past medical history and cardiovascular risk factors include: Aortic stenosis, hypertension, hyperlipidemia, non-insulin-dependent diabetes mellitus type 2, former smoker, advanced age.  He is referred to the office at the request of Christain Sacramento, MD for evaluation of aortic stenosis and preop clearance.  Patient is accompanied by his sister-in-law Nicholas Shelton who can be reached at [phone]. Patient provides verbal consent in regards to discussing his medical information and treatment plan in her presence.  He was referred to the office for preoperative risk stratification for upcoming right knee replacement. Today he tells me that the surgery is tentatively scheduled for Mar 25, 2021 with Dr. Theda Sers at [facility].  At the last office visit the shared decision was to proceed with Lexiscan to evaluate for reversible ischemia. The stress test was reported to be high risk study findings were reviewed with the patient and his sister-in-law and noted below for further reference. Given his moderate aortic stenosis patient had carotid bruits present on physical examination and therefore also underwent carotid duplex. Results reviewed with him in great detail at today's visit as well.  Patient states that he really wants to get the knee replacement done as soon as possible because it is affecting his day-to-day activities. Functionally patient states that he works out 1.5 hours/day.  However given his osteoarthritis of the knee is not able to do much treadmill.  He spends majority of the time lifting  weights.  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  Patient's blood pressure is not well controlled at today's office visit.  He states at home it is usually between 130-135 mmHg on the current antihypertensive medications being managed by his primary care provider. His morning blood pressures prior to coming to the office was 137/67.   ALLERGIES: No Known Allergies  MEDICATION LIST PRIOR TO VISIT: No outpatient medications have been marked as taking for the 02/05/21 encounter (Appointment) with Terri Skains, [place], DO.     PAST MEDICAL HISTORY: Past Medical History:  Diagnosis Date  . Abnormal blood chemistry test   . Actinic keratosis   . Arthritis   . AV block   . Blood transfusion without reported diagnosis   . Bradycardia   . Degeneration of lumbar or lumbosacral intervertebral disc   . Deviated nasal septum   . Diabetes mellitus without complication ([place])   . Gastroesophageal reflux disease without esophagitis   . Generalized anxiety disorder   . Gout with tophus   . Headache   . Hearing loss   . Hyperlipidemia   . Hypertension   . Insomnia   . Ischemia   . Murmur   . Nasal turbinate hypertrophy   . Osteoarthritis of wrist   . PAC (premature atrial contraction)   . Peptic ulcer   . Peripheral neuropathy   . PVC (premature ventricular contraction)   . Seasonal allergic rhinitis due to pollen     PAST SURGICAL HISTORY: Past Surgical History:  Procedure Laterality Date  . COLONOSCOPY    . ELBOW SURGERY     pinched nerve on both  arms  . INGUINAL HERNIA REPAIR Right 10/30/2018   Procedure: OPEN REPAIR OF INCARCERATED RIGHT INGUINAL HERNIA WITH MESH;  Surgeon: Greer Pickerel, MD;  Location: [place];  Service: General;  Laterality: Right;  . LEFT HEART CATH AND CORONARY ANGIOGRAPHY N/A 01/15/2021   Procedure: LEFT HEART CATH AND CORONARY ANGIOGRAPHY;  Surgeon: Adrian Prows, MD;  Location: [place] [facility];  Service: Cardiovascular;  Laterality: N/A;  . WRIST SURGERY      pinched nerve    FAMILY HISTORY: The patient family history includes Heart attack in his mother.  SOCIAL HISTORY:  The patient  reports that he quit smoking about 26 years ago. His smoking use included cigarettes. He has a 7.50 pack-year smoking history. He has never used smokeless tobacco. He reports that he does not drink alcohol and does not use drugs.  REVIEW OF SYSTEMS: Review of Systems  Constitutional: Negative for chills and fever.  HENT: Negative for hoarse voice and nosebleeds.        Difficulty hearing  Eyes: Negative for discharge, double vision and pain.  Cardiovascular: Negative for chest pain, claudication, dyspnea on exertion, leg swelling, near-syncope, orthopnea, palpitations, paroxysmal nocturnal dyspnea and syncope.  Respiratory: Negative for hemoptysis and shortness of breath.   Musculoskeletal: Negative for muscle cramps and myalgias.  Gastrointestinal: Negative for abdominal pain, constipation, diarrhea, hematemesis, hematochezia, melena, nausea and vomiting.  Neurological: Negative for dizziness and light-headedness.    PHYSICAL EXAM: Vitals with BMI 01/30/2021 01/15/2021 01/15/2021  Height _0  - -  Weight 198 lbs - -  BMI 24.23 - -  Systolic 536 - 144  Diastolic 67 - 70  Pulse 52 60 62   CONSTITUTIONAL: Well-developed and well-nourished. No acute distress.  SKIN: Skin is warm and dry. No rash noted. No cyanosis. No pallor. No jaundice HEAD: Normocephalic and atraumatic.  EYES: No scleral icterus MOUTH/THROAT: Moist oral membranes.  NECK: No JVD present. No thyromegaly noted. Bilateral carotid bruits  LYMPHATIC: No visible cervical adenopathy.  CHEST Normal respiratory effort. No intercostal retractions  LUNGS: Clear to auscultation bilaterally. No stridor. No wheezes. No rales.  CARDIOVASCULAR: Regular rate and rhythm, positive S1-S2, SEM 2RICS, no rubs or gallops appreciated. ABDOMINAL: No apparent ascites.  EXTREMITIES: No peripheral edema   HEMATOLOGIC: No significant bruising NEUROLOGIC: Oriented to person, place, and time. Nonfocal. Normal muscle tone.  PSYCHIATRIC: Normal mood and affect. Normal behavior. Cooperative  CARDIAC DATABASE: EKG: 01/15/2021: Sinus rhythm with 1st degree A-V blockNon-specific intra-ventricular conduction delayMinimal voltage criteria for LVH, may be normal variantU waves presentNonspecific T wave abnormalityAbnormal ECG No significant change since last tracing    Echocardiogram: 12/06/2020: Normal LV systolic function with visual EF 55-60%. Left ventricle cavity is normal in size. Moderate to severe concentric hypertrophy of the left ventricle. Normal global wall motion. Doppler evidence of grade II  diastolic dysfunction, elevated LAP. Left atrial cavity is severely dilated. Moderate aortic valve leaflet thickening with moderate calcification. Moderately restricted aortic valve leaflets. Moderate aortic stenosis. Peak velocity 3.36ms, Peak Gradient 47.5 mmHg, Mean Gradient 26.6 mmHg, AVA 1.1cm, DI 0.3. Mild (Grade I) mitral regurgitation. Mild tricuspid regurgitation. Mild pulmonary hypertension. RVSP measures 37 mmHg. IVC is dilated with a respiratory response of >50%. Prior study dated 11/26/2017: LVEF 60-65%, mild LVH, Grade 2 DD, Mild AS, mildly dilated LA, PASP 446mg.   Stress Testing: Lexiscan Tetrofosmin Stress Test 12/24/2020: Nondiagnostic ECG stress. The left ventricle is dilated in stress images more than rest images, LV end-diastolic volume 17315L. There is mild  diaphragmatic attenuation artifact in the inferior wall. Superimposed on this there is a moderate-sized moderate decrease in counts in the basal inferior and inferolateral wall suggestive of ischemia. Gated SPECT imaging of the left ventricle was abnormal, demonstrating hypokinesis of the mid inferolateral wall, mid inferior wall and basal inferolateral wall. Stress LV EF is mildly dysfunctional 48%.  No previous exam  available for comparison. High risk study.   Heart Catheterization: 01/15/2021: No significant coronary disease angiography.  Coronary calcification is evident.  Normal LVEF and mild aortic stenosis. 50 mL contrast used.   Carotid artery duplex 12/19/2020: Minimal stenosis in the right internal carotid artery (1-15%). Doppler velocity suggests stenosis in the right Minimal stenosis in the left internal carotid artery (1-15%). Doppler velocity suggests stenosis in the left external carotid artery (<50%). Mild heterogeneous plaque noted bilaterally.  Antegrade right vertebral artery flow. Antegrade left vertebral artery flow. External carotid artery stenosis is probably source of bruit. Follow up study is appropriate if clinically indicated.  LABORATORY DATA: External Labs: Collected: 11/29/2020 Creatinine 0.88 mg/dL. eGFR: 86 mL/min per 1.73 m Lipid profile: Total cholesterol 186, triglycerides 131, HDL 39, LDL 116, non HDL 147 Hemoglobin A1c: 7  IMPRESSION:  No diagnosis found.   RECOMMENDATIONS: Buck Mcaffee is a 80 y.o. male whose past medical history and cardiac risk factors include: Aortic stenosis, hypertension, hyperlipidemia, non-insulin-dependent diabetes mellitus type 2, former smoker, advanced age.  Abnormal nuclear stress test:  During his preoperative stratification patient underwent Lexiscan to evaluate for reversible ischemia. He is noted to have moderate intensity, medium sized reversible perfusion defect involving the inferior and inferolateral segments with regional wall motion abnormalities and gated SPECT.  Clinically he works out 1.5 hours a day (per patient) majority of it is resistant training as his cardio is limited due to osteoarthritis of the knee.  Given the fact that the disease severity is at least medium in size and moderate intensity with multiple cardiovascular risk factors that shared decision was to proceed with left heart catheterization prior to  upcoming noncardiac surgery to rule out proximal/obstructive CAD.  Patient is agreeable with the plan of care and would like to proceed with left heart catheterization.  The procedure of left heart catheterization with possible intervention was explained to the patient in detail.   The indication, alternatives, risks and benefits were reviewed.   Complications include but not limited to bleeding, infection, vascular injury, stroke, myocardial infection, arrhythmia, kidney injury, radiation-related injury in the case of prolonged fluoroscopy use, emergency cardiac surgery, and death. The patient understands the risks of serious complication is 1-2 in 9295 with diagnostic cardiac cath and 1-2% or less with angioplasty/stenting.   The patient voices understanding and provides verbal feedback and wishes to proceed with coronary angiography with possible PCI.  Until the work-up is not complete he is recommended to take aspirin 81 mg p.o. daily.   Preoperative risk stratification:  Planning to undergo a right knee replacement with Dr. Theda Sers, tentatively scheduled for January 23, 2021.  Further recommendations to follow.  Benign essential hypertension:  Office blood pressure is not well controlled.  Patient states that his home blood pressures are very well controlled.  I educated him that it is imperative that his blood pressures are under better control prior to his upcoming noncardiac surgery.  I also encouraged him to discuss possibly weaning off of clonidine with his PCP and considering other antihypertensive medications.  As clonidine can cause conduction abnormalities and given his ECG today cannot rule out  ectopic atrial rhythm and he also has 1st degree AV block and IVCD.   Moderate aortic stenosis:  Asymptomatic.  Patient denies any symptoms of angina, congestive heart failure, or syncope.  He is asked to seek medical attention if any of the 3 cardinal signs of aortic stenosis  surface.  Repeat echocardiogram in July 2022.  Bilateral carotid bruits:  Carotid duplex results reviewed with the patient. He has minimal stenosis bilaterally. No additional testing needed at this time.   Hyperlipidemia:  Independently reviewed the most recent lipid profile provided by his PCP as a part of this consultation.  Patient's LDL is greater than 70 mg/dL in the setting of non-insulin-dependent diabetes mellitus type 2.  May consider up titration of statin therapy to further optimize his lipid profile.  Will defer to primary team at this time.   FINAL MEDICATION LIST END OF ENCOUNTER: No orders of the defined types were placed in this encounter.   Current Outpatient Medications:  .  acetaminophen (TYLENOL) 500 MG tablet, Take 500 mg by mouth every 8 (eight) hours as needed for moderate pain., Disp: , Rfl:  .  amLODipine (NORVASC) 10 MG tablet, Take 10 mg by mouth daily., Disp: , Rfl:  .  aspirin EC 81 MG tablet, Take 1 tablet (81 mg total) by mouth daily. Swallow whole. (Patient taking differently: Take 81 mg by mouth 2 (two) times a week. Swallow whole.), Disp: 90 tablet, Rfl: 3 .  atorvastatin (LIPITOR) 20 MG tablet, Take 1 tablet (20 mg total) by mouth 2 (two) times a week. (Patient taking differently: Take 20 mg by mouth See admin instructions. Take one tablet (20 mg) by mouth twice weekly - Monday and Thursday), Disp: 10 tablet, Rfl: 10 .  carvedilol (COREG) 3.125 MG tablet, Take 1 tablet (3.125 mg total) by mouth 2 (two) times daily with a meal., Disp: 60 tablet, Rfl: 11 .  cloNIDine (CATAPRES) 0.3 MG tablet, Take 0.6 mg by mouth 2 (two) times daily. , Disp: , Rfl:  .  hydrochlorothiazide (HYDRODIURIL) 25 MG tablet, Take 25 mg by mouth daily., Disp: , Rfl:  .  lisinopril (PRINIVIL,ZESTRIL) 40 MG tablet, Take 40 mg by mouth every evening., Disp: , Rfl:  .  metFORMIN (GLUCOPHAGE) 1000 MG tablet, Take 1 tablet (1,000 mg total) by mouth 2 (two) times daily with a meal., Disp: ,  Rfl:  .  pantoprazole (PROTONIX) 40 MG tablet, Take 40 mg by mouth daily., Disp: , Rfl:   No orders of the defined types were placed in this encounter.   There are no Patient Instructions on file for this visit.   --Continue cardiac medications as reconciled in final medication list. --No follow-ups on file. Or sooner if needed. --Continue follow-up with your primary care physician regarding the management of your other chronic comorbid conditions.  Patient's questions and concerns were addressed to his satisfaction. He voices understanding of the instructions provided during this encounter.   This note was created using a voice recognition software as a result there may be grammatical errors inadvertently enclosed that do not reflect the nature of this encounter. Every attempt is made to correct such errors.  Rex Kras, [place], [facility]  Pager: [phone] Office: [phone]

## 2021-02-05 ENCOUNTER — Ambulatory Visit: Payer: Medicare Other | Admitting: Cardiology

## 2021-02-05 DIAGNOSIS — R0989 Other specified symptoms and signs involving the circulatory and respiratory systems: Secondary | ICD-10-CM

## 2021-02-05 DIAGNOSIS — E1165 Type 2 diabetes mellitus with hyperglycemia: Secondary | ICD-10-CM

## 2021-02-05 DIAGNOSIS — E782 Mixed hyperlipidemia: Secondary | ICD-10-CM

## 2021-02-05 DIAGNOSIS — Z87891 Personal history of nicotine dependence: Secondary | ICD-10-CM

## 2021-02-05 DIAGNOSIS — I350 Nonrheumatic aortic (valve) stenosis: Secondary | ICD-10-CM

## 2021-02-05 DIAGNOSIS — I10 Essential (primary) hypertension: Secondary | ICD-10-CM

## 2021-02-05 DIAGNOSIS — R9439 Abnormal result of other cardiovascular function study: Secondary | ICD-10-CM

## 2021-02-05 MED ORDER — BUPIVACAINE LIPOSOME 1.3 % IJ SUSP
20.0000 mL | Freq: Once | INTRAMUSCULAR | Status: DC
  Filled 2021-02-05: qty 20

## 2021-02-06 ENCOUNTER — Other Ambulatory Visit: Payer: Self-pay

## 2021-02-06 ENCOUNTER — Observation Stay (HOSPITAL_COMMUNITY)
Admission: RE | Admit: 2021-02-06 | Discharge: 2021-02-07 | Disposition: A | Discharge status: Home / Self Care | Payer: Medicare Other | Source: Other Acute Inpatient Hospital | Attending: Specialist | Admitting: Specialist

## 2021-02-06 ENCOUNTER — Encounter (HOSPITAL_COMMUNITY): Payer: Self-pay | Admitting: Specialist

## 2021-02-06 ENCOUNTER — Ambulatory Visit (HOSPITAL_COMMUNITY): Payer: Medicare Other | Admitting: Certified Registered"

## 2021-02-06 ENCOUNTER — Encounter (HOSPITAL_COMMUNITY)
Admission: RE | Disposition: A | Discharge status: Home / Self Care | Payer: Self-pay | Source: Other Acute Inpatient Hospital | Attending: Specialist

## 2021-02-06 ENCOUNTER — Ambulatory Visit: Payer: Medicare Other | Admitting: Cardiology

## 2021-02-06 ENCOUNTER — Ambulatory Visit (HOSPITAL_COMMUNITY): Payer: Medicare Other | Admitting: Physician Assistant

## 2021-02-06 DIAGNOSIS — E119 Type 2 diabetes mellitus without complications: Secondary | ICD-10-CM | POA: Diagnosis not present

## 2021-02-06 DIAGNOSIS — Z87891 Personal history of nicotine dependence: Secondary | ICD-10-CM | POA: Insufficient documentation

## 2021-02-06 DIAGNOSIS — M1711 Unilateral primary osteoarthritis, right knee: Principal | ICD-10-CM | POA: Diagnosis present

## 2021-02-06 DIAGNOSIS — I10 Essential (primary) hypertension: Secondary | ICD-10-CM | POA: Diagnosis not present

## 2021-02-06 DIAGNOSIS — Z79899 Other long term (current) drug therapy: Secondary | ICD-10-CM | POA: Insufficient documentation

## 2021-02-06 DIAGNOSIS — Z7982 Long term (current) use of aspirin: Secondary | ICD-10-CM | POA: Insufficient documentation

## 2021-02-06 DIAGNOSIS — Z7984 Long term (current) use of oral hypoglycemic drugs: Secondary | ICD-10-CM | POA: Diagnosis not present

## 2021-02-06 HISTORY — PX: TOTAL KNEE ARTHROPLASTY: SHX125

## 2021-02-06 LAB — CBC
HCT: 36.4 % — ABNORMAL LOW (ref 39.0–52.0)
Hemoglobin: 12.2 g/dL — ABNORMAL LOW (ref 13.0–17.0)
MCH: 29.6 pg (ref 26.0–34.0)
MCHC: 33.5 g/dL (ref 30.0–36.0)
MCV: 88.3 fL (ref 80.0–100.0)
Platelets: 185 10*3/uL (ref 150–400)
RBC: 4.12 MIL/uL — ABNORMAL LOW (ref 4.22–5.81)
RDW: 13.7 % (ref 11.5–15.5)
WBC: 7.4 10*3/uL (ref 4.0–10.5)
nRBC: 0 % (ref 0.0–0.2)

## 2021-02-06 LAB — TYPE AND SCREEN
ABO/RH(D): O POS
Antibody Screen: NEGATIVE

## 2021-02-06 LAB — COMPREHENSIVE METABOLIC PANEL
ALT: 13 U/L (ref 0–44)
AST: 22 U/L (ref 15–41)
Albumin: 4.4 g/dL (ref 3.5–5.0)
Alkaline Phosphatase: 76 U/L (ref 38–126)
Anion gap: 11 (ref 5–15)
BUN: 25 mg/dL — ABNORMAL HIGH (ref 8–23)
CO2: 24 mmol/L (ref 22–32)
Calcium: 9.6 mg/dL (ref 8.9–10.3)
Chloride: 104 mmol/L (ref 98–111)
Creatinine, Ser: 0.96 mg/dL (ref 0.61–1.24)
GFR, Estimated: >60 mL/min (ref >60)
Glucose, Bld: 161 mg/dL — ABNORMAL HIGH (ref 70–99)
Potassium: 3.8 mmol/L (ref 3.5–5.1)
Sodium: 139 mmol/L (ref 135–145)
Total Bilirubin: 0.7 mg/dL (ref 0.3–1.2)
Total Protein: 7.8 g/dL (ref 6.5–8.1)

## 2021-02-06 LAB — PROTIME-INR
INR: 1 (ref 0.8–1.2)
Prothrombin Time: 12.5 seconds (ref 11.4–15.2)

## 2021-02-06 LAB — GLUCOSE, CAPILLARY
Glucose-Capillary: 146 mg/dL — ABNORMAL HIGH (ref 70–99)
Glucose-Capillary: 156 mg/dL — ABNORMAL HIGH (ref 70–99)

## 2021-02-06 LAB — APTT: aPTT: 31 seconds (ref 24–36)

## 2021-02-06 LAB — ABO/RH: ABO/RH(D): O POS

## 2021-02-06 SURGERY — ARTHROPLASTY, KNEE, TOTAL
Anesthesia: Spinal | Site: Knee | Laterality: Right

## 2021-02-06 MED ORDER — OXYCODONE HCL 5 MG PO TABS
5.0000 mg | ORAL_TABLET | ORAL | Status: DC | PRN
  Administered 2021-02-06: 5 mg via ORAL

## 2021-02-06 MED ORDER — ROPIVACAINE HCL 7.5 MG/ML IJ SOLN
INTRAMUSCULAR | Status: DC | PRN
  Administered 2021-02-06: 20 mL via PERINEURAL

## 2021-02-06 MED ORDER — MIDAZOLAM HCL 2 MG/2ML IJ SOLN
INTRAMUSCULAR | Status: DC | PRN
  Administered 2021-02-06: .5 mg via INTRAVENOUS
  Administered 2021-02-06: 1 mg via INTRAVENOUS

## 2021-02-06 MED ORDER — ONDANSETRON HCL 4 MG/2ML IJ SOLN
INTRAMUSCULAR | Status: AC
  Filled 2021-02-06: qty 2

## 2021-02-06 MED ORDER — KETOROLAC TROMETHAMINE 15 MG/ML IJ SOLN
INTRAMUSCULAR | Status: AC
  Filled 2021-02-06: qty 1

## 2021-02-06 MED ORDER — CLONIDINE HCL 0.3 MG PO TABS: 0.3000 mg | ORAL_TABLET | Freq: Two times a day (BID) | ORAL | Status: DC

## 2021-02-06 MED ORDER — MAGNESIUM CITRATE PO SOLN: 1.0000 | Freq: Once | ORAL | Status: DC | PRN

## 2021-02-06 MED ORDER — HYDROCODONE-ACETAMINOPHEN 7.5-325 MG PO TABS
ORAL_TABLET | ORAL | Status: AC
  Filled 2021-02-06: qty 1

## 2021-02-06 MED ORDER — FENTANYL CITRATE (PF) 250 MCG/5ML IJ SOLN
INTRAMUSCULAR | Status: AC
  Filled 2021-02-06: qty 5

## 2021-02-06 MED ORDER — FENTANYL CITRATE (PF) 100 MCG/2ML IJ SOLN
INTRAMUSCULAR | Status: AC
  Filled 2021-02-06: qty 2

## 2021-02-06 MED ORDER — DIPHENHYDRAMINE HCL 12.5 MG/5ML PO ELIX
12.5000 mg | ORAL_SOLUTION | ORAL | Status: DC | PRN
  Administered 2021-02-07: 25 mg via ORAL
  Filled 2021-02-06 (×2): qty 10

## 2021-02-06 MED ORDER — ACETAMINOPHEN 325 MG PO TABS: 325.0000 mg | ORAL_TABLET | Freq: Four times a day (QID) | ORAL | Status: DC | PRN

## 2021-02-06 MED ORDER — HYDROCODONE-ACETAMINOPHEN 7.5-325 MG PO TABS
1.0000 | ORAL_TABLET | ORAL | Status: DC | PRN
  Administered 2021-02-06: 1 via ORAL

## 2021-02-06 MED ORDER — STERILE WATER FOR IRRIGATION IR SOLN
Status: DC | PRN
  Administered 2021-02-06: 2000 mL

## 2021-02-06 MED ORDER — PROPOFOL 500 MG/50ML IV EMUL
INTRAVENOUS | Status: DC | PRN
  Administered 2021-02-06: 100 ug/kg/min via INTRAVENOUS

## 2021-02-06 MED ORDER — SODIUM CHLORIDE (PF) 0.9 % IJ SOLN
INTRAMUSCULAR | Status: AC
  Filled 2021-02-06: qty 10

## 2021-02-06 MED ORDER — DEXAMETHASONE SODIUM PHOSPHATE 10 MG/ML IJ SOLN
INTRAMUSCULAR | Status: DC | PRN
  Administered 2021-02-06: 8 mg via INTRAVENOUS

## 2021-02-06 MED ORDER — SODIUM CHLORIDE 0.9 % IV SOLN: INTRAVENOUS | Status: DC

## 2021-02-06 MED ORDER — TRAMADOL HCL 50 MG PO TABS
ORAL_TABLET | ORAL | Status: AC
  Filled 2021-02-06: qty 1

## 2021-02-06 MED ORDER — PHENYLEPHRINE HCL (PRESSORS) 10 MG/ML IV SOLN
INTRAVENOUS | Status: AC
  Filled 2021-02-06: qty 1

## 2021-02-06 MED ORDER — CEFAZOLIN SODIUM-DEXTROSE 2-4 GM/100ML-% IV SOLN
2.0000 g | INTRAVENOUS | Status: AC
  Administered 2021-02-06: 2 g via INTRAVENOUS
  Filled 2021-02-06: qty 100

## 2021-02-06 MED ORDER — LISINOPRIL 20 MG PO TABS
40.0000 mg | ORAL_TABLET | Freq: Every evening | ORAL | Status: DC
  Administered 2021-02-06: 40 mg via ORAL
  Filled 2021-02-06: qty 2

## 2021-02-06 MED ORDER — 0.9 % SODIUM CHLORIDE (POUR BTL) OPTIME
TOPICAL | Status: DC | PRN
  Administered 2021-02-06: 1000 mL

## 2021-02-06 MED ORDER — KETOROLAC TROMETHAMINE 15 MG/ML IJ SOLN
15.0000 mg | Freq: Once | INTRAMUSCULAR | Status: AC
  Administered 2021-02-06: 15 mg via INTRAVENOUS

## 2021-02-06 MED ORDER — DEXAMETHASONE SODIUM PHOSPHATE 10 MG/ML IJ SOLN
INTRAMUSCULAR | Status: AC
  Filled 2021-02-06: qty 1

## 2021-02-06 MED ORDER — ACETAMINOPHEN 500 MG PO TABS
500.0000 mg | ORAL_TABLET | Freq: Four times a day (QID) | ORAL | Status: AC
  Administered 2021-02-06 – 2021-02-07 (×4): 500 mg via ORAL
  Filled 2021-02-06 (×3): qty 1

## 2021-02-06 MED ORDER — ATORVASTATIN CALCIUM 20 MG PO TABS
20.0000 mg | ORAL_TABLET | ORAL | Status: DC
  Filled 2021-02-06: qty 1

## 2021-02-06 MED ORDER — MORPHINE SULFATE (PF) 4 MG/ML IV SOLN
INTRAVENOUS | Status: AC
  Filled 2021-02-06: qty 1

## 2021-02-06 MED ORDER — MIDAZOLAM HCL 2 MG/2ML IJ SOLN
INTRAMUSCULAR | Status: AC
  Filled 2021-02-06: qty 2

## 2021-02-06 MED ORDER — MORPHINE SULFATE (PF) 4 MG/ML IV SOLN
0.5000 mg | INTRAVENOUS | Status: DC | PRN
  Administered 2021-02-06: 1 mg via INTRAVENOUS

## 2021-02-06 MED ORDER — SENNOSIDES-DOCUSATE SODIUM 8.6-50 MG PO TABS
1.0000 | ORAL_TABLET | Freq: Every evening | ORAL | Status: DC | PRN
  Filled 2021-02-06: qty 1

## 2021-02-06 MED ORDER — DEXAMETHASONE SODIUM PHOSPHATE 10 MG/ML IJ SOLN: 8.0000 mg | Freq: Once | INTRAMUSCULAR | Status: DC

## 2021-02-06 MED ORDER — TRANEXAMIC ACID-NACL 1000-0.7 MG/100ML-% IV SOLN
1000.0000 mg | INTRAVENOUS | Status: AC
  Administered 2021-02-06: 1000 mg via INTRAVENOUS
  Filled 2021-02-06: qty 100

## 2021-02-06 MED ORDER — ONDANSETRON HCL 4 MG/2ML IJ SOLN: 4.0000 mg | Freq: Once | INTRAMUSCULAR | Status: DC | PRN

## 2021-02-06 MED ORDER — PROPOFOL 10 MG/ML IV BOLUS
INTRAVENOUS | Status: DC | PRN
  Administered 2021-02-06: 20 mg via INTRAVENOUS
  Administered 2021-02-06 (×3): 10 mg via INTRAVENOUS

## 2021-02-06 MED ORDER — ONDANSETRON HCL 4 MG PO TABS
4.0000 mg | ORAL_TABLET | Freq: Four times a day (QID) | ORAL | Status: DC | PRN
  Filled 2021-02-06: qty 1

## 2021-02-06 MED ORDER — TRAMADOL HCL 50 MG PO TABS
50.0000 mg | ORAL_TABLET | Freq: Four times a day (QID) | ORAL | Status: DC
Start: 2021-02-06 — End: 2021-02-07
  Administered 2021-02-06 – 2021-02-07 (×4): 50 mg via ORAL
  Filled 2021-02-06 (×3): qty 1

## 2021-02-06 MED ORDER — METHOCARBAMOL 500 MG PO TABS: 500.0000 mg | ORAL_TABLET | Freq: Four times a day (QID) | ORAL | Status: DC | PRN

## 2021-02-06 MED ORDER — LACTATED RINGERS IV SOLN: INTRAVENOUS | Status: DC

## 2021-02-06 MED ORDER — ENOXAPARIN SODIUM 40 MG/0.4ML ~~LOC~~ SOLN
40.0000 mg | SUBCUTANEOUS | Status: DC
  Administered 2021-02-07: 40 mg via SUBCUTANEOUS
  Filled 2021-02-06 (×2): qty 0.4

## 2021-02-06 MED ORDER — HYDROCODONE-ACETAMINOPHEN 5-325 MG PO TABS
1.0000 | ORAL_TABLET | ORAL | Status: DC | PRN
  Administered 2021-02-06: 1 via ORAL
  Administered 2021-02-07: 2 via ORAL
  Filled 2021-02-06 (×2): qty 2

## 2021-02-06 MED ORDER — ACETAMINOPHEN 500 MG PO TABS
ORAL_TABLET | ORAL | Status: AC
  Filled 2021-02-06: qty 1

## 2021-02-06 MED ORDER — AMLODIPINE BESYLATE 10 MG PO TABS
10.0000 mg | ORAL_TABLET | Freq: Every day | ORAL | Status: DC
  Administered 2021-02-07: 10 mg via ORAL
  Filled 2021-02-06 (×2): qty 1

## 2021-02-06 MED ORDER — CLONIDINE HCL 0.1 MG PO TABS
0.3000 mg | ORAL_TABLET | Freq: Two times a day (BID) | ORAL | Status: DC
  Administered 2021-02-06 – 2021-02-07 (×2): 0.3 mg via ORAL
  Filled 2021-02-06 (×2): qty 3

## 2021-02-06 MED ORDER — CARVEDILOL 3.125 MG PO TABS
3.1250 mg | ORAL_TABLET | Freq: Two times a day (BID) | ORAL | Status: DC
  Administered 2021-02-06 – 2021-02-07 (×2): 3.125 mg via ORAL
  Filled 2021-02-06 (×2): qty 1

## 2021-02-06 MED ORDER — PANTOPRAZOLE SODIUM 40 MG PO TBEC
40.0000 mg | DELAYED_RELEASE_TABLET | Freq: Every day | ORAL | Status: DC
  Administered 2021-02-07: 40 mg via ORAL
  Filled 2021-02-06 (×2): qty 1

## 2021-02-06 MED ORDER — METFORMIN HCL 500 MG PO TABS
1000.0000 mg | ORAL_TABLET | Freq: Two times a day (BID) | ORAL | Status: DC
  Administered 2021-02-06 – 2021-02-07 (×2): 1000 mg via ORAL
  Filled 2021-02-06 (×2): qty 2

## 2021-02-06 MED ORDER — FENTANYL CITRATE (PF) 100 MCG/2ML IJ SOLN
25.0000 ug | INTRAMUSCULAR | Status: DC | PRN
  Administered 2021-02-06: 50 ug via INTRAVENOUS
  Administered 2021-02-06: 25 ug via INTRAVENOUS
  Administered 2021-02-06: 50 ug via INTRAVENOUS
  Administered 2021-02-06: 25 ug via INTRAVENOUS

## 2021-02-06 MED ORDER — PROPOFOL 10 MG/ML IV BOLUS
INTRAVENOUS | Status: AC
  Filled 2021-02-06: qty 20

## 2021-02-06 MED ORDER — METHOCARBAMOL 500 MG IVPB - SIMPLE MED
INTRAVENOUS | Status: AC
  Filled 2021-02-06: qty 50

## 2021-02-06 MED ORDER — METFORMIN HCL 500 MG PO TABS: 1000.0000 mg | ORAL_TABLET | Freq: Two times a day (BID) | ORAL | Status: DC

## 2021-02-06 MED ORDER — BUPIVACAINE LIPOSOME 1.3 % IJ SUSP
INTRAMUSCULAR | Status: DC | PRN
  Administered 2021-02-06: 20 mL

## 2021-02-06 MED ORDER — CEFAZOLIN SODIUM-DEXTROSE 1-4 GM/50ML-% IV SOLN
1.0000 g | Freq: Four times a day (QID) | INTRAVENOUS | Status: AC
  Administered 2021-02-06 (×3): 1 g via INTRAVENOUS
  Filled 2021-02-06 (×2): qty 50

## 2021-02-06 MED ORDER — LIDOCAINE 2% (20 MG/ML) 5 ML SYRINGE
INTRAMUSCULAR | Status: AC
  Filled 2021-02-06: qty 5

## 2021-02-06 MED ORDER — HYDROCHLOROTHIAZIDE 25 MG PO TABS
25.0000 mg | ORAL_TABLET | Freq: Every day | ORAL | Status: DC
  Administered 2021-02-07: 25 mg via ORAL
  Filled 2021-02-06 (×2): qty 1

## 2021-02-06 MED ORDER — OXYCODONE HCL 5 MG PO TABS
ORAL_TABLET | ORAL | Status: AC
  Filled 2021-02-06: qty 1

## 2021-02-06 MED ORDER — ONDANSETRON HCL 4 MG/2ML IJ SOLN: 4.0000 mg | Freq: Four times a day (QID) | INTRAMUSCULAR | Status: DC | PRN

## 2021-02-06 MED ORDER — OXYCODONE HCL 5 MG PO TABS
5.0000 mg | ORAL_TABLET | Freq: Once | ORAL | Status: AC | PRN
Start: 2021-02-06 — End: 2021-02-06
  Administered 2021-02-06: 5 mg via ORAL

## 2021-02-06 MED ORDER — CHLORHEXIDINE GLUCONATE 0.12 % MT SOLN
15.0000 mL | Freq: Once | OROMUCOSAL | Status: AC
  Administered 2021-02-06: 15 mL via OROMUCOSAL

## 2021-02-06 MED ORDER — POVIDONE-IODINE 10 % EX SWAB
2.0000 "application " | Freq: Once | CUTANEOUS | Status: AC
  Administered 2021-02-06: 2 via TOPICAL

## 2021-02-06 MED ORDER — CARVEDILOL 3.125 MG PO TABS: 3.1250 mg | ORAL_TABLET | Freq: Two times a day (BID) | ORAL | Status: DC

## 2021-02-06 MED ORDER — PROPOFOL 1000 MG/100ML IV EMUL
INTRAVENOUS | Status: AC
  Filled 2021-02-06: qty 100

## 2021-02-06 MED ORDER — FENTANYL CITRATE (PF) 250 MCG/5ML IJ SOLN
INTRAMUSCULAR | Status: DC | PRN
  Administered 2021-02-06: 50 ug via INTRAVENOUS

## 2021-02-06 MED ORDER — OXYCODONE HCL 5 MG/5ML PO SOLN
5.0000 mg | Freq: Once | ORAL | Status: AC | PRN
Start: 2021-02-06 — End: 2021-02-06

## 2021-02-06 MED ORDER — ORAL CARE MOUTH RINSE: 15.0000 mL | Freq: Once | OROMUCOSAL | Status: AC

## 2021-02-06 MED ORDER — ONDANSETRON HCL 4 MG/2ML IJ SOLN
INTRAMUSCULAR | Status: DC | PRN
  Administered 2021-02-06: 4 mg via INTRAVENOUS

## 2021-02-06 MED ORDER — BISACODYL 5 MG PO TBEC
5.0000 mg | DELAYED_RELEASE_TABLET | Freq: Every day | ORAL | Status: DC | PRN
  Filled 2021-02-06: qty 1

## 2021-02-06 MED ORDER — CEFAZOLIN SODIUM-DEXTROSE 1-4 GM/50ML-% IV SOLN
INTRAVENOUS | Status: AC
  Filled 2021-02-06: qty 50

## 2021-02-06 MED ORDER — METHOCARBAMOL 500 MG IVPB - SIMPLE MED
500.0000 mg | Freq: Four times a day (QID) | INTRAVENOUS | Status: DC | PRN
  Administered 2021-02-06: 500 mg via INTRAVENOUS
  Filled 2021-02-06: qty 50

## 2021-02-06 MED ORDER — SODIUM CHLORIDE 0.9 % IR SOLN
Status: DC | PRN
  Administered 2021-02-06: 1000 mL

## 2021-02-06 MED ORDER — SODIUM CHLORIDE (PF) 0.9 % IJ SOLN
INTRAMUSCULAR | Status: DC | PRN
  Administered 2021-02-06: 60 mL

## 2021-02-06 MED ORDER — HYDRALAZINE HCL 20 MG/ML IJ SOLN
10.0000 mg | Freq: Once | INTRAMUSCULAR | Status: AC
  Administered 2021-02-06: 10 mg via INTRAVENOUS

## 2021-02-06 MED ORDER — LIDOCAINE 2% (20 MG/ML) 5 ML SYRINGE
INTRAMUSCULAR | Status: DC | PRN
  Administered 2021-02-06: 60 mg via INTRAVENOUS

## 2021-02-06 MED ORDER — HYDRALAZINE HCL 20 MG/ML IJ SOLN
INTRAMUSCULAR | Status: AC
  Filled 2021-02-06: qty 1

## 2021-02-06 SURGICAL SUPPLY — 67 items
ATTUNE MED DOME PAT 38 KNEE (Knees) ×2 IMPLANT
ATTUNE PS FEM RT SZ 6 CEM KNEE (Femur) ×2 IMPLANT
ATTUNE PSRP INSR SZ6 8 KNEE (Insert) ×2 IMPLANT
BAG ZIPLOCK 12X15 (MISCELLANEOUS) ×2 IMPLANT
BASE TIBIAL ROT PLAT SZ 8 KNEE (Knees) ×1 IMPLANT
BLADE SAG 18X100X1.27 (BLADE) ×2 IMPLANT
BLADE SAW SGTL 11.0X1.19X90.0M (BLADE) ×2 IMPLANT
BNDG ELASTIC 4X5.8 VLCR STR LF (GAUZE/BANDAGES/DRESSINGS) ×2 IMPLANT
BNDG ELASTIC 6X5.8 VLCR STR LF (GAUZE/BANDAGES/DRESSINGS) ×2 IMPLANT
BONE CEMENT GENTAMICIN (Cement) ×4 IMPLANT
BOWL SMART MIX CTS (DISPOSABLE) ×2 IMPLANT
CEMENT BONE GENTAMICIN 40 (Cement) ×2 IMPLANT
COVER SURGICAL LIGHT HANDLE (MISCELLANEOUS) ×2 IMPLANT
COVER WAND RF STERILE (DRAPES) ×2 IMPLANT
CUFF TOURN SGL QUICK 34 (TOURNIQUET CUFF) ×2
CUFF TRNQT CYL 34X4.125X (TOURNIQUET CUFF) ×1 IMPLANT
DECANTER SPIKE VIAL GLASS SM (MISCELLANEOUS) ×2 IMPLANT
DERMABOND ADVANCED (GAUZE/BANDAGES/DRESSINGS) ×1
DERMABOND ADVANCED .7 DNX12 (GAUZE/BANDAGES/DRESSINGS) ×1 IMPLANT
DRAPE U-SHAPE 47X51 STRL (DRAPES) ×2 IMPLANT
DRESSING AQUACEL AG SP 3.5X10 (GAUZE/BANDAGES/DRESSINGS) ×1 IMPLANT
DRSG AQUACEL AG ADV 3.5X10 (GAUZE/BANDAGES/DRESSINGS) ×2 IMPLANT
DRSG AQUACEL AG SP 3.5X10 (GAUZE/BANDAGES/DRESSINGS) ×2
DRSG TEGADERM 4X4.75 (GAUZE/BANDAGES/DRESSINGS) ×2 IMPLANT
DURAPREP 26ML APPLICATOR (WOUND CARE) ×4 IMPLANT
ELECT REM PT RETURN 15FT ADLT (MISCELLANEOUS) ×2 IMPLANT
EVACUATOR 1/8 PVC DRAIN (DRAIN) ×2 IMPLANT
GAUZE SPONGE 2X2 8PLY STRL LF (GAUZE/BANDAGES/DRESSINGS) ×1 IMPLANT
GLOVE SRG 8 PF TXTR STRL LF DI (GLOVE) ×1 IMPLANT
GLOVE SURG LTX SZ8 (GLOVE) ×2 IMPLANT
GLOVE SURG ORTHO LTX SZ9 (GLOVE) ×2 IMPLANT
GLOVE SURG POLYISO LF SZ7 (GLOVE) ×2 IMPLANT
GLOVE SURG UNDER POLY LF SZ7.5 (GLOVE) ×2 IMPLANT
GLOVE SURG UNDER POLY LF SZ8 (GLOVE) ×2
GOWN STRL REUS W/TWL XL LVL3 (GOWN DISPOSABLE) ×4 IMPLANT
HANDPIECE INTERPULSE COAX TIP (DISPOSABLE) ×2
IRRIGATION SURGIPHOR STRL (IV SOLUTION) ×2 IMPLANT
JET LAVAGE IRRISEPT WOUND (IRRIGATION / IRRIGATOR)
KIT TURNOVER KIT A (KITS) ×2 IMPLANT
LAVAGE JET IRRISEPT WOUND (IRRIGATION / IRRIGATOR) IMPLANT
NS IRRIG 1000ML POUR BTL (IV SOLUTION) ×2 IMPLANT
PACK TOTAL KNEE CUSTOM (KITS) ×2 IMPLANT
PENCIL SMOKE EVACUATOR (MISCELLANEOUS) IMPLANT
PIN DRILL FIX HALF THREAD (BIT) ×2 IMPLANT
PIN STEINMAN FIXATION KNEE (PIN) ×2 IMPLANT
PROTECTOR NERVE ULNAR (MISCELLANEOUS) ×2 IMPLANT
SET HNDPC FAN SPRY TIP SCT (DISPOSABLE) ×1 IMPLANT
SET PAD KNEE POSITIONER (MISCELLANEOUS) ×2 IMPLANT
SPONGE GAUZE 2X2 STER 10/PKG (GAUZE/BANDAGES/DRESSINGS) ×1
SPONGE LAP 18X18 RF (DISPOSABLE) IMPLANT
SPONGE SURGIFOAM ABS GEL 100 (HEMOSTASIS) ×2 IMPLANT
STOCKINETTE 6  STRL (DRAPES) ×2
STOCKINETTE 6 STRL (DRAPES) ×1 IMPLANT
SUT BONE WAX W31G (SUTURE) IMPLANT
SUT MNCRL AB 3-0 PS2 18 (SUTURE) ×2 IMPLANT
SUT VIC AB 1 CT1 27 (SUTURE) ×6
SUT VIC AB 1 CT1 27XBRD ANTBC (SUTURE) ×3 IMPLANT
SUT VIC AB 2-0 CT1 27 (SUTURE) ×4
SUT VIC AB 2-0 CT1 TAPERPNT 27 (SUTURE) ×2 IMPLANT
SUT VLOC 180 0 24IN GS25 (SUTURE) ×2 IMPLANT
SYR 50ML LL SCALE MARK (SYRINGE) ×2 IMPLANT
TAPE STRIPS DRAPE STRL (GAUZE/BANDAGES/DRESSINGS) ×2 IMPLANT
TIBIAL BASE ROT PLAT SZ 8 KNEE (Knees) ×2 IMPLANT
TRAY CATH INTERMITTENT SS 16FR (CATHETERS) ×2 IMPLANT
TUBE SUCTION HIGH CAP CLEAR NV (SUCTIONS) ×2 IMPLANT
WATER STERILE IRR 1000ML POUR (IV SOLUTION) ×4 IMPLANT
WRAP KNEE MAXI GEL POST OP (GAUZE/BANDAGES/DRESSINGS) ×2 IMPLANT

## 2021-02-06 NOTE — Transfer of Care (Signed)
Immediate Anesthesia Transfer of Care Note  Patient: Ruffus Guthmiller  Procedure(s) Performed: TOTAL KNEE ARTHROPLASTY (Right Knee)  Patient Location: PACU  Anesthesia Type:Spinal  Level of Consciousness: awake and alert   Airway & Oxygen Therapy: Patient Spontanous Breathing and Patient connected to face mask oxygen  Post-op Assessment: Report given to RN and Post -op Vital signs reviewed and stable  Post vital signs: Reviewed and stable  Last Vitals:  Vitals Value Taken Time  BP    Temp    Pulse 44 02/06/21 1009  Resp 17 02/06/21 1009  SpO2 100 % 02/06/21 1009  Vitals shown include unvalidated device data.  Last Pain:  Vitals:   02/06/21 0604  TempSrc: Oral  PainSc:          Complications: No complications documented.

## 2021-02-06 NOTE — Anesthesia Procedure Notes (Signed)
Anesthesia Regional Block: Adductor canal block   Pre-Anesthetic Checklist: ,, timeout performed, Correct Patient, Correct Site, Correct Laterality, Correct Procedure, Correct Position, site marked, Risks and benefits discussed,  Surgical consent,  Pre-op evaluation,  At surgeon's request and post-op pain management  Laterality: Right  Prep: chloraprep       Needles:  Injection technique: Single-shot  Needle Type: Echogenic Needle     Needle Length: 10cm  Needle Gauge: 21     Additional Needles:   Narrative:  Start time: 02/06/2021 7:01 AM End time: 02/06/2021 7:05 AM Injection made incrementally with aspirations every 5 mL.  Performed by: Personally  Anesthesiologist: Audry Pili, MD  Additional Notes: No pain on injection. No increased resistance to injection. Injection made in 5cc increments. Good needle visualization. Patient tolerated the procedure well.

## 2021-02-06 NOTE — Interval H&P Note (Signed)
History and Physical Interval Note:  02/06/2021 7:31 AM  Nicholas Shelton  has presented today for surgery, with the diagnosis of Right knee osteoarthritis.  The various methods of treatment have been discussed with the patient and family. After consideration of risks, benefits and other options for treatment, the patient has consented to  Procedure(s) with comments: TOTAL KNEE ARTHROPLASTY (Right) - with adductor canal as a surgical intervention.  The patient's history has been reviewed, patient examined, no change in status, stable for surgery.  I have reviewed the patient's chart and labs.  Questions were answered to the patient's satisfaction.     [name] ANDREW

## 2021-02-06 NOTE — Anesthesia Procedure Notes (Signed)
Procedure Name: MAC Date/Time: 02/06/2021 6:50 AM Performed by: Cynda Familia, CRNA Pre-anesthesia Checklist: Patient identified, Emergency Drugs available, Patient being monitored, Suction available and Timeout performed Patient Re-evaluated:Patient Re-evaluated prior to induction Oxygen Delivery Method: Nasal cannula Placement Confirmation: positive ETCO2 and breath sounds checked- equal and bilateral Dental Injury: Teeth and Oropharynx as per pre-operative assessment

## 2021-02-06 NOTE — Anesthesia Postprocedure Evaluation (Signed)
Anesthesia Post Note  Patient: Chief Financial Officer  Procedure(s) Performed: TOTAL KNEE ARTHROPLASTY (Right Knee)     Patient location during evaluation: PACU Anesthesia Type: Spinal Level of consciousness: awake and alert Pain management: pain level controlled Vital Signs Assessment: post-procedure vital signs reviewed and stable Respiratory status: spontaneous breathing and respiratory function stable Cardiovascular status: blood pressure returned to baseline and stable Postop Assessment: spinal receding Anesthetic complications: no   No complications documented.  Last Vitals:  Vitals:   02/06/21 1345 02/06/21 1422  BP: (!) 170/69 (!) 166/70  Pulse: 61 62  Resp: 17 16  Temp:  36.4 C  SpO2: 95% 95%    Last Pain:  Vitals:   02/06/21 1422  TempSrc: Oral  PainSc: 0-No pain                 Tiajuana Amass

## 2021-02-06 NOTE — Anesthesia Procedure Notes (Signed)
Date/Time: 02/06/2021 7:31 AM Performed by: Cynda Familia, CRNA Pre-anesthesia Checklist: Patient identified, Emergency Drugs available, Suction available, Patient being monitored and Timeout performed Patient Re-evaluated:Patient Re-evaluated prior to induction Oxygen Delivery Method: Simple face mask Placement Confirmation: positive ETCO2 and breath sounds checked- equal and bilateral Dental Injury: Teeth and Oropharynx as per pre-operative assessment

## 2021-02-06 NOTE — Anesthesia Preprocedure Evaluation (Addendum)
Anesthesia Evaluation  Patient identified by MRN, date of birth, ID band Patient awake    Reviewed: Allergy & Precautions, NPO status , Patient's Chart, lab work & pertinent test results  History of Anesthesia Complications Negative for: history of anesthetic complications  Airway Mallampati: II  TM Distance: >3 FB Neck ROM: Full    Dental  (+) Edentulous Upper, Edentulous Lower   Pulmonary former smoker,    Pulmonary exam normal        Cardiovascular hypertension, Pt. on medications and Pt. on home beta blockers Normal cardiovascular exam+ dysrhythmias (PVCs, PACs, AVB) + Valvular Problems/Murmurs AS    '22 Cath - No significant coronary disease angiography.  Coronary calcification is evident.  Normal LVEF and mild aortic stenosis.   '22 Myoview - Nondiagnostic ECG stress. The left ventricle is dilated in stress images more than rest images, LV end-diastolic volume 549 mL.  There is mild diaphragmatic attenuation artifact in the inferior wall.  Superimposed on this there is a moderate-sized decrease in counts in the basal inferior and inferolateral wall suggestive of ischemia. Gated SPECT imaging of the left ventricle was abnormal, demonstrating hypokinesis of the mid inferolateral wall, mid inferior wall and basal inferolateral wall.  Stress LV EF is mildly dysfunctional 48%.      Neuro/Psych  Headaches, PSYCHIATRIC DISORDERS Anxiety    GI/Hepatic Neg liver ROS, PUD, GERD  Medicated and Controlled,  Endo/Other  diabetes, Type 2, Oral Hypoglycemic Agents  Renal/GU negative Renal ROS     Musculoskeletal  (+) Arthritis ,  Gout    Abdominal   Peds  Hematology  (+) anemia ,   Anesthesia Other Findings Covid test negative   Reproductive/Obstetrics                            Anesthesia Physical Anesthesia Plan  ASA: III  Anesthesia Plan: Spinal   Post-op Pain Management:  Regional for  Post-op pain   Induction:   PONV Risk Score and Plan: 1 and Treatment may vary due to age or medical condition and Propofol infusion  Airway Management Planned: Natural Airway and Simple Face Mask  Additional Equipment: None  Intra-op Plan:   Post-operative Plan:   Informed Consent: I have reviewed the patients History and Physical, chart, labs and discussed the procedure including the risks, benefits and alternatives for the proposed anesthesia with the patient or authorized representative who has indicated his/her understanding and acceptance.       Plan Discussed with: CRNA and Anesthesiologist  Anesthesia Plan Comments: (Labs reviewed, platelets acceptable. Discussed risks and benefits of spinal, including spinal/epidural hematoma, infection, failed block, and PDPH. Patient expressed understanding and wished to proceed. )      Anesthesia Quick Evaluation

## 2021-02-06 NOTE — Evaluation (Signed)
Physical Therapy Evaluation Patient Details Name: Nicholas Shelton MRN: [id number] DOB: 16-Feb-1941 Today's Date: 02/06/2021   History of Present Illness  Patient is 80 y.o. male s/p Rt TKA on 02/06/21 with PMH significant for PVC. PAC, HTN, HLD, DM, AV block, neuropathy.    Clinical Impression  Nicholas Shelton is a 80 y.o. male POD 0 s/p Rt TKA. Patient reports independence with occasional use of SPC due to knee pain mobility at baseline. Patient is now limited by functional impairments (see PT problem list below) and requires min assist for transfers and gait with RW. Patient was able to ambulate ~35 feet with RW and min assist. he denied dizziness, lightheadedness, SOB, but did become diaphoretic at end of gait. VSS at EOS when pt sitting in recliner. Patient instructed in exercise to facilitate circulation to manage edema and reduce risk of DVT. Patient will benefit from continued skilled PT interventions to address impairments and progress towards PLOF. Acute PT will follow to progress mobility and stair training in preparation for safe discharge home.     Follow Up Recommendations Follow surgeon's recommendation for DC plan and follow-up therapies;Home health PT    Equipment Recommendations  Rolling walker with 5" wheels;3in1 (PT)    Recommendations for Other Services       Precautions / Restrictions Precautions Precautions: Fall Restrictions Weight Bearing Restrictions: No RLE Weight Bearing: Weight bearing as tolerated Other Position/Activity Restrictions: WBAT      Mobility  Bed Mobility Overal bed mobility: Needs Assistance Bed Mobility: Supine to Sit     Supine to sit: Min assist     General bed mobility comments: cues for sequencing and assist to raise trunk and scoot to EOB. pt able to bring LE's off edge without assist.    Transfers Overall transfer level: Needs assistance Equipment used: Rolling walker (2 wheeled) Transfers: Sit to/from Stand Sit to Stand: Min assist          General transfer comment: cues for safe hand placement for power up. assist to rise from EOB. cues for safe reach back and to extend Rt knee prior to sit.  Ambulation/Gait Ambulation/Gait assistance: Min assist Gait Distance (Feet): 35 Feet Assistive device: Rolling walker (2 wheeled) Gait Pattern/deviations: Step-to pattern;Decreased stride length;Decreased weight shift to right Gait velocity: decr   General Gait Details: cues for step pattern and safe position to RW. no overt LOB noted. pt denied dizziness, lightheadedness, or overheated sensation but became diaphoretic after ambulating ~20 feet.  Stairs            Wheelchair Mobility    Modified Rankin (Stroke Patients Only)       Balance Overall balance assessment: Needs assistance Sitting-balance support: Feet supported Sitting balance-Leahy Scale: Good     Standing balance support: During functional activity;Bilateral upper extremity supported Standing balance-Leahy Scale: Poor                               Pertinent Vitals/Pain Pain Assessment: 0-10 Pain Score: 3  Pain Descriptors / Indicators: Aching;Discomfort;Sore Pain Intervention(s): Limited activity within patient's tolerance;Monitored during session;Repositioned;Ice applied    Home Living Family/patient expects to be discharged to:: Private residence Living Arrangements: Spouse/significant other Available Help at Discharge: Family Type of Home: House Home Access: Stairs to enter Entrance Stairs-Rails: Can reach both Entrance Stairs-Number of Steps: 8 Home Layout: One level Home Equipment: [place] - single point Additional Comments: pt's wife can help him around home, she does  not drive    Prior Function Level of Independence: Independent               Hand Dominance   Dominant Hand: Right    Extremity/Trunk Assessment   Upper Extremity Assessment Upper Extremity Assessment: Overall WFL for tasks assessed     Lower Extremity Assessment Lower Extremity Assessment: RLE deficits/detail RLE Deficits / Details: good quad activation, no extensor lag with SLR. 4/5 for ankle dorsi/plantar flexion bil LE's. RLE Sensation: WNL RLE Coordination: WNL    Cervical / Trunk Assessment Cervical / Trunk Assessment: Normal  Communication   Communication: No difficulties  Cognition Arousal/Alertness: Awake/alert Behavior During Therapy: WFL for tasks assessed/performed Overall Cognitive Status: Within Functional Limits for tasks assessed                                        General Comments      Exercises Total Joint Exercises Ankle Circles/Pumps: AROM;Both;20 reps;Seated   Assessment/Plan    PT Assessment Patient needs continued PT services  PT Problem List Decreased strength;Decreased range of motion;Decreased activity tolerance;Decreased balance;Decreased mobility;Decreased knowledge of use of DME;Pain;Decreased knowledge of precautions       PT Treatment Interventions Gait training;DME instruction;Stair training;Functional mobility training;Balance training;Therapeutic exercise;Therapeutic activities;Patient/family education    PT Goals (Current goals can be found in the Care Plan section)  Acute Rehab PT Goals Patient Stated Goal: get walking more with less pain PT Goal Formulation: With patient Time For Goal Achievement: 02/13/21 Potential to Achieve Goals: Good    Frequency 7X/week   Barriers to discharge        Co-evaluation               AM-PAC PT "6 Clicks" Mobility  Outcome Measure Help needed turning from your back to your side while in a flat bed without using bedrails?: A Little Help needed moving from lying on your back to sitting on the side of a flat bed without using bedrails?: A Little Help needed moving to and from a bed to a chair (including a wheelchair)?: A Little Help needed standing up from a chair using your arms (e.g., wheelchair or  bedside chair)?: A Little Help needed to walk in hospital room?: A Little Help needed climbing 3-5 steps with a railing? : A Lot 6 Click Score: 17    End of Session Equipment Utilized During Treatment: Gait belt Activity Tolerance: Patient tolerated treatment well Patient left: in chair;with call bell/phone within reach;with chair alarm set Nurse Communication: Mobility status PT Visit Diagnosis: Muscle weakness (generalized) (M62.81);Difficulty in walking, not elsewhere classified (R26.2)    Time: 3329-5188 PT Time Calculation (min) (ACUTE ONLY): 26 min   Charges:   PT Evaluation $PT Eval Low Complexity: 1 Low PT Treatments $Gait Training: 8-22 mins        Verner Mould, DPT Acute Rehabilitation Services Office [phone] Pager [phone]    Jacques Navy 02/06/2021, 5:52 PM

## 2021-02-06 NOTE — Op Note (Signed)
DATE OF SURGERY:  02/06/2021  TIME: 9:43 AM  PATIENT NAME:  Nicholas Shelton    AGE: 80 y.o.   PRE-OPERATIVE DIAGNOSIS:  Right knee osteoarthritis  POST-OPERATIVE DIAGNOSIS:  Right knee osteoarthritis  PROCEDURE:  Procedure(s): TOTAL KNEE ARTHROPLASTY  SURGEON:  Christyna Letendre ANDREW  ASSISTANT:  Leeanne Haus, PA-C, present and scrubbed throughout the case, critical for assistance with exposure, retraction, instrumentation, and closure.  OPERATIVE IMPLANTS: Depuy PFC Attune Rotating Platform.  Femur size 6, Tibia size 8, Patella size 38 3-peg oval button, with a 10 mm polyethylene insert.   PREOPERATIVE INDICATIONS:   Nicholas Shelton is a 80 y.o. year old male with end stage bone on bone arthritis of the knee who failed conservative treatment and elected for Total Knee Arthroplasty.   The risks, benefits, and alternatives were discussed at length including but not limited to the risks of infection, bleeding, nerve injury, stiffness, blood clots, the need for revision surgery, cardiopulmonary complications, among others, and they were willing to proceed.  OPERATIVE DESCRIPTION:  The patient was brought to the operative room and placed in a supine position.  Spinal anesthesia was administered.  IV antibiotics were given.  The lower extremity was prepped and draped in the usual sterile fashion.  Time out was performed.  The leg was elevated and exsanguinated and the tourniquet was inflated.  Anterior quadriceps tendon splitting approach was performed.  The patella was retracted and osteophytes were removed.  The anterior horn of the medial and lateral meniscus was removed and cruciate ligaments resected.   The distal femur was opened with the drill and the intramedullary distal femoral cutting jig was utilized, set at 5 degrees resecting 10 mm off the distal femur.  Care was taken to protect the collateral ligaments.  The distal femoral sizing jig was applied, taking care to avoid notching.   Then the 4-in-1 cutting jig was applied and the anterior and posterior femur was cut, along with the chamfer cuts.    Then the extramedullary tibial cutting jig was utilized making the appropriate cut using the anterior tibial crest as a reference building in appropriate posterior slope.  Care was taken during the cut to protect the medial and collateral ligaments.  The proximal tibia was removed along with the posterior horns of the menisci.   The posterior medial femoral osteophytes and posterior lateral femoral osteophytes were removed.    The flexion gap was then measured and was symmetric with the extension gap, measured at 10.  I completed the distal femoral preparation using the appropriate jig to prepare the box.  The patella was then measured, and cut with the saw.    The proximal tibia sized and prepared accordingly with the reamer and the punch, and then all components were trialed with the trial insert.  The knee was found to have excellent balance and full motion.    The above named components were then cemented into place and all excess cement was removed.  The trial polyethylene component was in place during cementation, and then was exchanged for the real polyethylene component.    The knee was easily taken through a range of motion and the patella tracked well and the knee irrigated copiously and the parapatellar and subcutaneous tissue closed with vicryl, and monocryl with steri strips for the skin.  The arthrotomy was closed at 90 of flexion. The wounds were dressed with sterile gauze and the tourniquet released and the patient was awakened and returned to the PACU in stable and  satisfactory condition.  There were no complications.  Total tourniquet time was 80 minutes.                DATE OF SURGERY:  02/06/2021  TIME: 9:43 AM  PATIENT NAME:  Nicholas Shelton    AGE: 80 y.o.   PRE-OPERATIVE DIAGNOSIS:  Right knee osteoarthritis  POST-OPERATIVE DIAGNOSIS:   Right knee osteoarthritis  PROCEDURE:  Procedure(s): TOTAL KNEE ARTHROPLASTY  SURGEON:  Bhavana Kady ANDREW  ASSISTANT:  Leeanne Haus, PA-C, present and scrubbed throughout the case, critical for assistance with exposure, retraction, instrumentation, and closure.  OPERATIVE IMPLANTS: Depuy PFC Attune Rotating Platform.  Femur size 6, Tibia size 8, Patella size 38 3-peg oval button, with a 10 mm polyethylene insert.   PREOPERATIVE INDICATIONS:   Nicholas Shelton is a 80 y.o. year old male with end stage bone on bone arthritis of the knee who failed conservative treatment and elected for Total Knee Arthroplasty.   The risks, benefits, and alternatives were discussed at length including but not limited to the risks of infection, bleeding, nerve injury, stiffness, blood clots, the need for revision surgery, cardiopulmonary complications, among others, and they were willing to proceed.  OPERATIVE DESCRIPTION:  The patient was brought to the operative room and placed in a supine position.  Spinal anesthesia was administered.  IV antibiotics were given.  The lower extremity was prepped and draped in the usual sterile fashion.  Time out was performed.  The leg was elevated and exsanguinated and the tourniquet was inflated.  Anterior quadriceps tendon splitting approach was performed.  The patella was retracted and osteophytes were removed.  The anterior horn of the medial and lateral meniscus was removed and cruciate ligaments resected.   The distal femur was opened with the drill and the intramedullary distal femoral cutting jig was utilized, set at 5 degrees resecting 10 mm off the distal femur.  Care was taken to protect the collateral ligaments.  The distal femoral sizing jig was applied, taking care to avoid notching.  Then the 4-in-1 cutting jig was applied and the anterior and posterior femur was cut, along with the chamfer cuts.    Then the extramedullary tibial cutting jig was utilized  making the appropriate cut using the anterior tibial crest as a reference building in appropriate posterior slope.  Care was taken during the cut to protect the medial and collateral ligaments.  The proximal tibia was removed along with the posterior horns of the menisci.   The posterior medial femoral osteophytes and posterior lateral femoral osteophytes were removed.    The flexion gap was then measured and was symmetric with the extension gap, measured at 10.  I completed the distal femoral preparation using the appropriate jig to prepare the box.  The patella was then measured, and cut with the saw.    The proximal tibia sized and prepared accordingly with the reamer and the punch, and then all components were trialed with the trial insert.  The knee was found to have excellent balance and full motion.    The above named components were then cemented into place and all excess cement was removed.  The trial polyethylene component was in place during cementation, and then was exchanged for the real polyethylene component.    The knee was easily taken through a range of motion and the patella tracked well and the knee irrigated copiously and the parapatellar and subcutaneous tissue closed with vicryl, and monocryl with steri strips for the skin.  The arthrotomy  was closed at 90 of flexion. The wounds were dressed with sterile gauze and the tourniquet released and the patient was awakened and returned to the PACU in stable and satisfactory condition.  There were no complications.  Total tourniquet time was 70 minutes.

## 2021-02-06 NOTE — Anesthesia Procedure Notes (Signed)
Spinal  Patient location during procedure: OR End time: 02/06/2021 7:40 AM Reason for block: surgical anesthesia Staffing Performed: resident/CRNA  Resident/CRNA: Cynda Familia, CRNA Preanesthetic Checklist Completed: patient identified, IV checked, site marked, risks and benefits discussed, surgical consent, monitors and equipment checked, pre-op evaluation and timeout performed Spinal Block Patient position: sitting Prep: DuraPrep and site prepped and draped Patient monitoring: heart rate, continuous pulse ox, blood pressure and cardiac monitor Approach: midline Location: L2-3 Injection technique: single-shot Needle Needle type: Sprotte  Needle gauge: 24 G Assessment Sensory level: T4 Events: CSF return Additional Notes Expiration date of tray noted and within date.   Patient tolerated procedure well. Brock assisted and supervised - prep dry at time of insertion

## 2021-02-07 ENCOUNTER — Encounter (HOSPITAL_COMMUNITY): Payer: Self-pay | Admitting: Specialist

## 2021-02-07 DIAGNOSIS — M1711 Unilateral primary osteoarthritis, right knee: Secondary | ICD-10-CM | POA: Diagnosis not present

## 2021-02-07 MED ORDER — OXYCODONE HCL 5 MG PO TABS: 5.0000 mg | ORAL_TABLET | ORAL | 0 refills | Status: AC | PRN

## 2021-02-07 MED ORDER — METHOCARBAMOL 500 MG PO TABS
500.0000 mg | ORAL_TABLET | Freq: Four times a day (QID) | ORAL | 0 refills | Status: DC
Start: 2021-02-07 — End: 2021-07-18

## 2021-02-07 MED ORDER — TRAMADOL HCL 50 MG PO TABS: 50.0000 mg | ORAL_TABLET | Freq: Four times a day (QID) | ORAL | 0 refills | Status: DC | PRN

## 2021-02-07 MED ORDER — ONDANSETRON HCL 4 MG PO TABS: 4.0000 mg | ORAL_TABLET | Freq: Every day | ORAL | 1 refills | Status: DC | PRN

## 2021-02-07 NOTE — Discharge Summary (Signed)
Physician Discharge Summary  Patient ID: Nicholas Shelton MRN: [id number] DOB/AGE: 1941/08/05 80 y.o.  Admit date: 02/06/2021 Discharge date: 02/07/2021  Admission Diagnoses: Right knee Osteoarthritis  Discharge Diagnoses:  Active Problems:   Osteoarthritis of right knee   Discharged Condition: good  Hospital Course: Patient was admitted on 02/06/2021 for a right total knee arthroplasty due to end stage osteoarthritis. He tolerated surgery well. He was sent to PACU and postop floor in stable condition. He was able to ambulate with PT that evening with minimal difficulties. No events overnight. Pain well controlled with PO pain meds. Postop day 1 patient doing well. He was able to get up with PT. Discharged home. He will be doing home health PT with [facility]. Medications sent to pharmacy. He will follow up in the office in 2 weeks. Discharge instructions discussed with patient.   Consults: None  Significant Diagnostic Studies: none  Treatments: IV hydration, antibiotics: Ancef, analgesia: acetaminophen and Hydrocodone, tramadol, anticoagulation: Lovenox, therapies: PT and surgery: right TKA  Discharge Exam: Blood pressure (!) 183/85, pulse 74, temperature 98.8 F (37.1 C), temperature source Oral, resp. rate 16, height 5\' 10"  (1.778 m), weight 89.8 kg, SpO2 98 %. General appearance: alert, cooperative, appears stated age and no distress Extremities: extremities normal, atraumatic, no cyanosis or edema and Homans sign is negative, no sign of DVT Pulses: 2+ and symmetric Skin: Skin color, texture, turgor normal. No rashes or lesions Neurologic: Grossly normal Incision/Wound: dressings clean dry and intact  Disposition: Discharge disposition: 01-Home or Self Care       Discharge Instructions    Call MD / Call 911   Complete by: As directed    If you experience chest pain or shortness of breath, CALL 911 and be transported to the hospital emergency room.  If you develope a fever above  101 F, pus (white drainage) or increased drainage or redness at the wound, or calf pain, call your surgeon's office.   Constipation Prevention   Complete by: As directed    Drink plenty of fluids.  Prune juice may be helpful.  You may use a stool softener, such as Colace (over the counter) 100 mg twice a day.  Use MiraLax (over the counter) for constipation as needed.   Diet - low sodium heart healthy   Complete by: As directed    Discharge instructions   Complete by: As directed    Dr. Sydnee Cabal [facility] [address] [place], [place] [address] [phone]  TOTAL KNEE REPLACEMENT POSTOPERATIVE DIRECTIONS  Knee Rehabilitation, Guidelines Following Surgery  Results after knee surgery are often greatly improved when you follow the exercise, range of motion and muscle strengthening exercises prescribed by your doctor. Safety measures are also important to protect the knee from further injury. Any time any of these exercises cause you to have increased pain or swelling in your knee joint, decrease the amount until you are comfortable again and slowly increase them. If you have problems or questions, call your caregiver or physical therapist for advice.   HOME CARE INSTRUCTIONS  Remove items at home which could result in a fall. This includes throw rugs or furniture in walking pathways.  ICE to the affected knee every three hours for 30 minutes at a time and then as needed for pain and swelling.  Continue to use ice on the knee for pain and swelling from surgery. You may notice swelling that will progress down to the foot and ankle.  This is normal after  surgery.  Elevate the leg when you are not up walking on it.   Continue to use the breathing machine which will help keep your temperature down.  It is common for your temperature to cycle up and down following surgery, especially at night when you are not up moving around and exerting yourself.  The breathing machine keeps  your lungs expanded and your temperature down. Do not place pillow under knee, focus on keeping the knee straight while resting   DIET You may resume your previous home diet once your are discharged from the hospital.  DRESSING / WOUND CARE / SHOWERING Keep the surgical dressing until follow up.  The dressing is water proof, but you need to put extra covering over it like plastic wrap.  IF THE DRESSING FALLS OFF or the wound gets wet inside, change the dressing with sterile gauze.  Please use good hand washing techniques before changing the dressing.  Do not use any lotions or creams on the incision until instructed by your surgeon.   You may start showering once you are discharged home but do not submerge the incision under water.  You are sent home with an ACE bandage on over the leg, this can be removed at 3 days from surgery. At this time you can start showering. Please place the white TED stocking on the surgical leg after. This needs to be worn on the surgical leg for 2 weeks after surgery.   ACTIVITY Walk with your walker as instructed. Use walker as long as suggested by your caregivers. Avoid periods of inactivity such as sitting longer than an hour when not asleep. This helps prevent blood clots.  You may resume a sexual relationship in one month or when given the OK by your doctor.  You may return to work once you are cleared by your doctor.  Do not drive a car for 6 weeks or until released by you surgeon.  Do not drive while taking narcotics.  WEIGHT BEARING Weight bearing as tolerated with assist device (walker, cane, etc) as directed, use it as long as suggested by your surgeon or therapist, typically at least 4-6 weeks.  POSTOPERATIVE CONSTIPATION PROTOCOL Constipation - defined medically as fewer than three stools per week and severe constipation as less than one stool per week.  One of the most common issues patients have following surgery is constipation.  Even if you have  a regular bowel pattern at home, your normal regimen is likely to be disrupted due to multiple reasons following surgery.  Combination of anesthesia, postoperative narcotics, change in appetite and fluid intake all can affect your bowels.  In order to avoid complications following surgery, here are some recommendations in order to help you during your recovery period.  Colace (docusate) - Pick up an over-the-counter form of Colace or another stool softener and take twice a day as long as you are requiring postoperative pain medications.  Take with a full glass of water daily.  If you experience loose stools or diarrhea, hold the colace until you stool forms back up.  If your symptoms do not get better within 1 week or if they get worse, check with your doctor.  Dulcolax (bisacodyl) - Pick up over-the-counter and take as directed by the product packaging as needed to assist with the movement of your bowels.  Take with a full glass of water.  Use this product as needed if not relieved by Colace only.   MiraLax (polyethylene glycol) - Pick up  over-the-counter to have on hand.  MiraLax is a solution that will increase the amount of water in your bowels to assist with bowel movements.  Take as directed and can mix with a glass of water, juice, soda, coffee, or tea.  Take if you go more than two days without a movement. Do not use MiraLax more than once per day. Call your doctor if you are still constipated or irregular after using this medication for 7 days in a row.  If you continue to have problems with postoperative constipation, please contact the office for further assistance and recommendations.  If you experience "the worst abdominal pain ever" or develop nausea or vomiting, please contact the office immediatly for further recommendations for treatment.  ITCHING  If you experience itching with your medications, try taking only a single pain pill, or even half a pain pill at a time.  You can also use  Benadryl over the counter for itching or also to help with sleep.   TED HOSE STOCKINGS Wear the elastic stockings on both legs for two weeks following surgery during the day but you may remove then at night for sleeping.  Okay to remove ACE in 3 days, put TED on after  MEDICATIONS See your medication summary on the "After Visit Summary" that the nursing staff will review with you prior to discharge.  You may have some home medications which will be placed on hold until you complete the course of blood thinner medication.  It is important for you to complete the blood thinner medication as prescribed by your surgeon.  Continue your approved medications as instructed at time of discharge. If you were not previously taking any blood thinners prior to surgery please start taking Aspirin 325 mg tabs twice daily for 6 weeks. If you are unable to take Aspirin please let your doctor know.   PRECAUTIONS If you experience chest pain or shortness of breath - call 911 immediately for transfer to the hospital emergency department.  If you develop a fever greater that 101 F, purulent drainage from wound, increased redness or drainage from wound, foul odor from the wound/dressing, or calf pain - CONTACT YOUR SURGEON.                                                   FOLLOW-UP APPOINTMENTS Make sure you keep all of your appointments after your operation with your surgeon and caregivers. You should call the office at the above phone number and make an appointment for approximately two weeks after the date of your surgery or on the date instructed by your surgeon outlined in the "After Visit Summary".   RANGE OF MOTION AND STRENGTHENING EXERCISES  Rehabilitation of the knee is important following a knee injury or an operation. After just a few days of immobilization, the muscles of the thigh which control the knee become weakened and shrink (atrophy). Knee exercises are designed to build up the tone and strength  of the thigh muscles and to improve knee motion. Often times heat used for twenty to thirty minutes before working out will loosen up your tissues and help with improving the range of motion but do not use heat for the first two weeks following surgery. These exercises can be done on a training (exercise) mat, on the floor, on a table or on a  bed. Use what ever works the best and is most comfortable for you Knee exercises include:  Leg Lifts - While your knee is still immobilized in a splint or cast, you can do straight leg raises. Lift the leg to 60 degrees, hold for 3 sec, and slowly lower the leg. Repeat 10-20 times 2-3 times daily. Perform this exercise against resistance later as your knee gets better.  Quad and Hamstring Sets - Tighten up the muscle on the front of the thigh (Quad) and hold for 5-10 sec. Repeat this 10-20 times hourly. Hamstring sets are done by pushing the foot backward against an object and holding for 5-10 sec. Repeat as with quad sets.  Leg Slides: Lying on your back, slowly slide your foot toward your buttocks, bending your knee up off the floor (only go as far as is comfortable). Then slowly slide your foot back down until your leg is flat on the floor again. Angel Wings: Lying on your back spread your legs to the side as far apart as you can without causing discomfort.  A rehabilitation program following serious knee injuries can speed recovery and prevent re-injury in the future due to weakened muscles. Contact your doctor or a physical therapist for more information on knee rehabilitation.   IF YOU ARE TRANSFERRED TO A SKILLED REHAB FACILITY If the patient is transferred to a skilled rehab facility following release from the hospital, a list of the current medications will be sent to the facility for the patient to continue.  When discharged from the skilled rehab facility, please have the facility set up the patient's [place] prior to being released.  Also, the skilled facility will be responsible for providing the patient with their medications at time of release from the facility to include their pain medication, the muscle relaxants, and their blood thinner medication. If the patient is still at the rehab facility at time of the two week follow up appointment, the skilled rehab facility will also need to assist the patient in arranging follow up appointment in our office and any transportation needs.  MAKE SURE YOU:  Understand these instructions.  Get help right away if you are not doing well or get worse.    Pick up stool softner and laxative for home use following surgery while on pain medications. May shower starting three days after surgery. Please use a clean towel to pat the leg dry following showers. Continue to use ice for pain and swelling after surgery. Do not use any lotions or creams on the incision until instructed by you Start Aspirin immediately following surgery.   Do not put a pillow under the knee. Place it under the heel.   Complete by: As directed    Driving restrictions   Complete by: As directed    No driving for two weeks   TED hose   Complete by: As directed    Use stockings (TED hose) for three weeks on both leg(s).  You may remove them at night for sleeping.   Weight bearing as tolerated   Complete by: As directed      Allergies as of 02/07/2021   No Known Allergies     Medication List    TAKE these medications   acetaminophen 500 MG tablet Commonly known as: TYLENOL Take 500 mg by mouth every 8 (eight) hours as needed for moderate pain.   amLODipine 10 MG tablet Commonly known as: NORVASC Take 10 mg by mouth daily.  aspirin EC 81 MG tablet Take 1 tablet (81 mg total) by mouth daily. Swallow whole. What changed: when to take this   atorvastatin 20 MG tablet Commonly known as: LIPITOR Take 1 tablet (20 mg total) by mouth 2 (two) times a week. What changed:   when to take  this  additional instructions   carvedilol 3.125 MG tablet Commonly known as: COREG Take 1 tablet (3.125 mg total) by mouth 2 (two) times daily with a meal.   cloNIDine 0.3 MG tablet Commonly known as: CATAPRES Take 0.6 mg by mouth 2 (two) times daily.   hydrochlorothiazide 25 MG tablet Commonly known as: HYDRODIURIL Take 25 mg by mouth daily.   lisinopril 40 MG tablet Commonly known as: ZESTRIL Take 40 mg by mouth every evening.   metFORMIN 1000 MG tablet Commonly known as: GLUCOPHAGE Take 1 tablet (1,000 mg total) by mouth 2 (two) times daily with a meal.   methocarbamol 500 MG tablet Commonly known as: Robaxin Take 1 tablet (500 mg total) by mouth 4 (four) times daily.   ondansetron 4 MG tablet Commonly known as: Zofran Take 1 tablet (4 mg total) by mouth daily as needed for nausea or vomiting.   oxyCODONE 5 MG immediate release tablet Commonly known as: Roxicodone Take 1 tablet (5 mg total) by mouth every 4 (four) hours as needed for up to 7 days.   pantoprazole 40 MG tablet Commonly known as: PROTONIX Take 40 mg by mouth daily.   traMADol 50 MG tablet Commonly known as: Ultram Take 1 tablet (50 mg total) by mouth every 6 (six) hours as needed.            Discharge Care Instructions  (From admission, onward)         Start     Ordered   02/07/21 0000  Weight bearing as tolerated        02/07/21 0633           Signed: Nettie Elm [facility] [phone] 02/07/2021, 6:33 AM

## 2021-02-07 NOTE — Progress Notes (Signed)
Physical Therapy Treatment Patient Details Name: Nicholas Shelton MRN: [id number] DOB: [birth year] Today's Date: 02/07/2021    History of Present Illness Patient is 80 y.o. male s/p Rt TKA on 02/06/21 with PMH significant for PVC. PAC, HTN, HLD, DM, AV block, neuropathy.    PT Comments    Pt progressing toward acute PT goals with increased ambulation distance of a total of 110ft with MIN assist progressing to MIN guard-supervision for safety. Pt demonstrated ability to perform safe stair negotiation with MIN assist- MIN guard for safety and verbal cues for sequencing. Reviewed LE HEP with no complaints of increased pain or fatigue. Pt is at safe mobility level for return home and will have assistance from his wife and brother at home.  Acute therapy to follow up during stay.       Follow Up Recommendations  Follow surgeon's recommendation for DC plan and follow-up therapies;Home health PT     Equipment Recommendations  Rolling walker with 5" wheels;3in1 (PT)    Recommendations for Other Services       Precautions / Restrictions Precautions Precautions: Fall Restrictions Weight Bearing Restrictions: No RLE Weight Bearing: Weight bearing as tolerated    Mobility  Bed Mobility Overal bed mobility: Needs Assistance Bed Mobility: Supine to Sit     Supine to sit: Supervision     General bed mobility comments: pt with use of B UEs to scoot to EOB, supervision for safety    Transfers Overall transfer level: Needs assistance Equipment used: Rolling walker (2 wheeled) Transfers: Sit to/from Stand Sit to Stand: Min guard         General transfer comment: cues for safe hand placement for power up from EOB and to extend Rt knee prior to sitting. Pt demonstrated safe hand placement with sit to stand from recliner.  Ambulation/Gait Ambulation/Gait assistance: Min assist;Min guard;Supervision Gait Distance (Feet): 170 Feet (100,70) Assistive device: Rolling walker (2 wheeled) Gait  Pattern/deviations: Step-to pattern;Decreased stride length;Decreased weight shift to right Gait velocity: decr   General Gait Details: MIN assist progressing to MIN guard-supervision wiht cues for step to gait pattern and safe position to RW. No overt LOB noted. pt denied dizziness, lightheadedness and did not become diaphoretic this session.   Stairs Stairs: Yes Stairs assistance: Min assist;Min guard Stair Management: Two rails;Forwards Number of Stairs: 6 General stair comments: MIN assist progressing to MIN guard for safety with cues for sequencing "up with the good, down with the bad" Pt was able to verbalize correct sequencing pattern multiple times throughout session when prompted.   Wheelchair Mobility    Modified Rankin (Stroke Patients Only)       Balance Overall balance assessment: Needs assistance Sitting-balance support: Feet supported Sitting balance-Leahy Scale: Good     Standing balance support: During functional activity;Bilateral upper extremity supported Standing balance-Leahy Scale: Poor Standing balance comment: use of RW to maintani standing balance                            Cognition Arousal/Alertness: Awake/alert Behavior During Therapy: WFL for tasks assessed/performed Overall Cognitive Status: Within Functional Limits for tasks assessed                                        Exercises Total Joint Exercises Ankle Circles/Pumps: AROM;Both;20 reps;Seated Quad Sets: AROM;Right;5 reps;Seated Heel Slides: AROM;Right;5 reps;Seated    General  Comments        Pertinent Vitals/Pain Pain Assessment: 0-10 Pain Score: 5  Pain Location: Rt knee Pain Descriptors / Indicators: Aching;Discomfort;Sore Pain Intervention(s): Monitored during session;Limited activity within patient's tolerance;Repositioned;Ice applied    Home Living                      Prior Function            PT Goals (current goals can now  be found in the care plan section) Acute Rehab PT Goals Patient Stated Goal: get walking more with less pain PT Goal Formulation: With patient Time For Goal Achievement: 02/13/21 Potential to Achieve Goals: Good Progress towards PT goals: Progressing toward goals    Frequency    7X/week      PT Plan Current plan remains appropriate    Co-evaluation              AM-PAC PT "6 Clicks" Mobility   Outcome Measure  Help needed turning from your back to your side while in a flat bed without using bedrails?: None Help needed moving from lying on your back to sitting on the side of a flat bed without using bedrails?: A Little Help needed moving to and from a bed to a chair (including a wheelchair)?: A Little Help needed standing up from a chair using your arms (e.g., wheelchair or bedside chair)?: A Little Help needed to walk in hospital room?: A Little Help needed climbing 3-5 steps with a railing? : A Little 6 Click Score: 19    End of Session Equipment Utilized During Treatment: Gait belt Activity Tolerance: Patient tolerated treatment well Patient left: in chair;with call bell/phone within reach;with chair alarm set Nurse Communication: Mobility status PT Visit Diagnosis: Muscle weakness (generalized) (M62.81);Difficulty in walking, not elsewhere classified (R26.2)     Time: 6333-5456 PT Time Calculation (min) (ACUTE ONLY): 38 min  Charges:                        Elna Breslow, SPT  Acute rehab     Elna Breslow 02/07/2021, 10:31 AM

## 2021-02-07 NOTE — Progress Notes (Signed)
Subjective: 1 Day Post-Op Procedure(s) (LRB): TOTAL KNEE ARTHROPLASTY (Right) Patient reports pain as 3 on 0-10 scale.   Was able to get up yesterday with PT and ambulate Reports no issues at this time   Objective: Vital signs in last 24 hours: Temp:  [97.2 F (36.2 C)-98.8 F (37.1 C)] 98.8 F (37.1 C) (03/31 0502) Pulse Rate:  [47-74] 74 (03/31 0502) Resp:  [0-19] 16 (03/31 0502) BP: (135-187)/(59-87) 183/85 (03/31 0502) SpO2:  [94 %-100 %] 98 % (03/31 0502) Weight:  [89.8 kg] 89.8 kg (03/30 1800)  Intake/Output from previous day: 03/30 0701 - 03/31 0700 In: 3519.4 [P.O.:580; I.V.:2739.4; IV Piggyback:200] Out: 1450 [Urine:1400; Blood:50] Intake/Output this shift: Total I/O In: 1106.5 [P.O.:340; I.V.:566.5; IV Piggyback:200] Out: 700 [Urine:700]  Recent Labs    02/06/21 0525  HGB 12.2*   Recent Labs    02/06/21 0525  WBC 7.4  RBC 4.12*  HCT 36.4*  PLT 185   Recent Labs    02/06/21 0525  NA 139  K 3.8  CL 104  CO2 24  BUN 25*  CREATININE 0.96  GLUCOSE 161*  CALCIUM 9.6   Recent Labs    02/06/21 0525  INR 1.0    Neurologically intact Neurovascular intact Sensation intact distally Intact pulses distally Dorsiflexion/Plantar flexion intact Incision: dressing C/D/I Compartment soft   Assessment/Plan: 1 Day Post-Op Procedure(s) (LRB): TOTAL KNEE ARTHROPLASTY (Right) Advance diet Up with therapy if patient does well this am with therapy okay to go home Will be doing home health therapy with [facility] and [facility], he is made aware Medications sent to pharmacy this am BP high this am, patient is asymptomatic will get patient BP meds this am and monitor    Patient's anticipated LOS is less than 2 midnights, meeting these requirements: - Younger than 65 - Lives within 1 hour of care - Has a competent adult at home to recover with post-op recover - NO history of  - Chronic pain requiring opiods  - Diabetes  - Coronary Artery Disease  -  Heart failure  - Heart attack  - Stroke  - DVT/VTE  - Cardiac arrhythmia  - Respiratory Failure/COPD  - Renal failure  - Anemia  - Advanced Liver disease       Nettie Elm [facility] [phone] 02/07/2021, 6:23 AM

## 2021-02-07 NOTE — TOC Transition Note (Signed)
Transition of Care [facility]) - CM/SW Discharge Note  Patient Details  Name: Nicholas Shelton MRN: [id number] Date of Birth: 09/21/1941  [facility]) CM/SW Contact:  Sherie Don, LCSW Phone Number: 02/07/2021, 10:02 AM  Clinical Narrative: Patient expected to discharge today. CSW spoke with patient to confirm discharge plan. Patient to discharge home with HHPT with [facility] ([facility]) and will need DME (3N1, RW). [employer] to deliver DME to patient's room. CSW notified Ronalee Belts with [facility] of discharge. No additional needs at this time. TOC signing off.  Final next level of care: [place] Barriers to Discharge: No Barriers Identified  Patient Goals and CMS Choice Patient states their goals for this hospitalization and ongoing recovery are:: Discharge home with HHPT through [place] CMS Medicare.gov Compare Post Acute Care list provided to:: Patient Choice offered to / list presented to : Patient  Discharge Plan and Services        DME Arranged: 3-N-1,Walker rolling DME Agency: [employer] Representative spoke with at DME Agency: Arranged prior to surgery HH Arranged: PT [place]: [facility] [employer] [facility] [employer] Representative spoke with at [place]: [place] prior to surgery  Readmission Risk Interventions No flowsheet data found.

## 2021-02-07 NOTE — Plan of Care (Signed)
Pt recovering well from surgery without complications. Was medicated for pain and has been resting following medication administration. No signs of infection or DVT.

## 2021-03-13 ENCOUNTER — Ambulatory Visit: Payer: Medicare Other | Admitting: Cardiology

## 2021-06-18 ENCOUNTER — Other Ambulatory Visit: Payer: Self-pay

## 2021-06-18 ENCOUNTER — Ambulatory Visit: Payer: Medicare Other

## 2021-06-18 DIAGNOSIS — I350 Nonrheumatic aortic (valve) stenosis: Secondary | ICD-10-CM

## 2021-07-16 NOTE — Progress Notes (Signed)
Called pt to inform him to make an appt. Transferred him to the front.

## 2021-07-18 ENCOUNTER — Encounter: Payer: Self-pay | Admitting: Cardiology

## 2021-07-18 ENCOUNTER — Ambulatory Visit: Payer: Medicare Other | Admitting: Cardiology

## 2021-07-18 ENCOUNTER — Other Ambulatory Visit: Payer: Self-pay

## 2021-07-18 VITALS — BP 190/79 | HR 57 | Temp 98.0°F | Resp 16 | Ht 70.0 in | Wt 198.0 lb

## 2021-07-18 DIAGNOSIS — I10 Essential (primary) hypertension: Secondary | ICD-10-CM

## 2021-07-18 DIAGNOSIS — E782 Mixed hyperlipidemia: Secondary | ICD-10-CM

## 2021-07-18 DIAGNOSIS — Z87891 Personal history of nicotine dependence: Secondary | ICD-10-CM

## 2021-07-18 DIAGNOSIS — I350 Nonrheumatic aortic (valve) stenosis: Secondary | ICD-10-CM

## 2021-07-18 DIAGNOSIS — E1165 Type 2 diabetes mellitus with hyperglycemia: Secondary | ICD-10-CM

## 2021-07-18 NOTE — Progress Notes (Signed)
Date:  07/18/2021   ID:  Nicholas Shelton, DOB 1941/02/22, MRN [id number]  PCP:  Nicholas Sacramento, MD  Cardiologist:  Nicholas Kras, DO, [facility] (established care 12/13/2020) Cardiac Electrophysiologist: Dr. Virl Axe.   Date: 07/18/21 Last Office Visit: 01/03/2021   Chief Complaint  Patient presents with   Aortic Stenosis    HPI  Nicholas Shelton is a 80 y.o. male who presents to the office with a chief complaint of " aortic stenosis." Patient's past medical history and cardiovascular risk factors include: Aortic stenosis, hypertension, hyperlipidemia, non-insulin-dependent diabetes mellitus type 2, former smoker, advanced age.  He is referred to the office at the request of Nicholas Sacramento, MD for evaluation of aortic stenosis and preop clearance.  Patient presents to the office accompanied by his brother Nicholas Shelton and he provides verbal consent with regards to discussing his information in the presence of his brother.  Patient was last seen in the office back in February 2022 prior to his right knee replacement which was done successfully and he is recovered very well.  He recently had an echocardiogram at the office to reevaluate the severity of aortic stenosis and now presents for follow-up.  Most recent echocardiogram performed in August 2022 notes moderate aortic valve stenosis with mild reduction in LVEF.  Patient's LVEF back in January 2022 was 55-60% and now it is 50-55%.  Clinically he denies angina pectoris, congestive heart failure symptoms, near-syncope or syncopal events.  Patient's office blood pressures are not well controlled; however, at home patient checks his blood pressure twice a day and SBP ranges between 130-136 mmHg.  ALLERGIES: No Known Allergies  MEDICATION LIST PRIOR TO VISIT: Current Meds  Medication Sig   acetaminophen (TYLENOL) 500 MG tablet Take 500 mg by mouth every 8 (eight) hours as needed for moderate pain.   allopurinol (ZYLOPRIM) 300 MG tablet Take 300 mg by mouth  daily.   amLODipine (NORVASC) 10 MG tablet Take 10 mg by mouth daily.   aspirin EC 81 MG tablet Take 1 tablet (81 mg total) by mouth daily. Swallow whole. (Patient taking differently: Take 81 mg by mouth 2 (two) times a week. Swallow whole.)   atorvastatin (LIPITOR) 20 MG tablet Take 1 tablet (20 mg total) by mouth 2 (two) times a week. (Patient taking differently: Take 20 mg by mouth See admin instructions. Take one tablet (20 mg) by mouth twice weekly - Monday and Thursday)   carvedilol (COREG) 3.125 MG tablet Take 1 tablet (3.125 mg total) by mouth 2 (two) times daily with a meal.   cloNIDine (CATAPRES) 0.3 MG tablet Take 0.6 mg by mouth 2 (two) times daily.    hydrochlorothiazide (HYDRODIURIL) 25 MG tablet Take 25 mg by mouth daily.   lisinopril (PRINIVIL,ZESTRIL) 40 MG tablet Take 40 mg by mouth every evening.   metFORMIN (GLUCOPHAGE) 1000 MG tablet Take 1 tablet (1,000 mg total) by mouth 2 (two) times daily with a meal.   pantoprazole (PROTONIX) 40 MG tablet Take 40 mg by mouth daily.     PAST MEDICAL HISTORY: Past Medical History:  Diagnosis Date   Abnormal blood chemistry test    Actinic keratosis    Arthritis    AV block    Blood transfusion without reported diagnosis    Bradycardia    Degeneration of lumbar or lumbosacral intervertebral disc    Deviated nasal septum    Diabetes mellitus without complication (HCC)    Gastroesophageal reflux disease without esophagitis    Generalized anxiety disorder  Gout with tophus    Headache    Hearing loss    Hyperlipidemia    Hypertension    Insomnia    Ischemia    Murmur    Nasal turbinate hypertrophy    Osteoarthritis of wrist    PAC (premature atrial contraction)    Peptic ulcer    Peripheral neuropathy    PVC (premature ventricular contraction)    Seasonal allergic rhinitis due to pollen     PAST SURGICAL HISTORY: Past Surgical History:  Procedure Laterality Date   COLONOSCOPY     ELBOW SURGERY     pinched nerve on  both arms   INGUINAL HERNIA REPAIR Right 10/30/2018   Procedure: OPEN REPAIR OF INCARCERATED RIGHT INGUINAL HERNIA WITH MESH;  Surgeon: Greer Pickerel, MD;  Location: [facility];  Service: General;  Laterality: Right;   LEFT HEART CATH AND CORONARY ANGIOGRAPHY N/A 01/15/2021   Procedure: LEFT HEART CATH AND CORONARY ANGIOGRAPHY;  Surgeon: Adrian Prows, MD;  Location: [facility];  Service: Cardiovascular;  Laterality: N/A;   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  SOCIAL HISTORY:  The patient  reports that he quit smoking about 26 years ago. His smoking use included cigarettes. He has a 7.50 pack-year smoking history. He has never used smokeless tobacco. He reports that he does not drink alcohol and does not use drugs.  REVIEW OF SYSTEMS: Review of Systems  Constitutional: Negative for chills and fever.  HENT:  Negative for hoarse voice and nosebleeds.        Difficulty hearing  Eyes:  Negative for discharge, double vision and pain.  Cardiovascular:  Negative for chest pain, claudication, dyspnea on exertion, leg swelling, near-syncope, orthopnea, palpitations, paroxysmal nocturnal dyspnea and syncope.  Respiratory:  Negative for hemoptysis and shortness of breath.   Musculoskeletal:  Negative for muscle cramps and myalgias.  Gastrointestinal:  Negative for abdominal pain, constipation, diarrhea, hematemesis, hematochezia, melena, nausea and vomiting.  Neurological:  Negative for dizziness and light-headedness.   PHYSICAL EXAM: Vitals with BMI 07/18/2021 07/18/2021 02/07/2021  Height - '5\' 10"'  -  Weight - 198 lbs -  BMI - 64.40 -  Systolic [phone]  Diastolic 79 89 85  Pulse 57 61 74   CONSTITUTIONAL: Well-developed and  well-nourished. No acute distress.  SKIN: Skin is warm and dry. No rash noted. No cyanosis. No pallor. No jaundice HEAD: Normocephalic and atraumatic.  EYES: No scleral icterus MOUTH/THROAT: Moist oral membranes.  NECK: No JVD present. No thyromegaly noted. Bilateral carotid bruits  LYMPHATIC: No visible cervical adenopathy.  CHEST Normal respiratory effort. No intercostal retractions  LUNGS: Clear to auscultation bilaterally. No stridor. No wheezes. No rales.  CARDIOVASCULAR: Regular rate and rhythm, positive S1-S2, SEM 2RICS, no rubs or gallops appreciated. ABDOMINAL: No apparent ascites.  EXTREMITIES: No peripheral edema, 2+ DP and PT pulses. HEMATOLOGIC: No significant bruising NEUROLOGIC: Oriented to person, place, and time. Nonfocal. Normal muscle tone.  PSYCHIATRIC: Normal mood and affect. Normal behavior. Cooperative  CARDIAC DATABASE: EKG: 12/13/2020: Probable normal sinus rhythm cannot rule out ectopic atrial rhythm.  63 bpm, first-degree AV block, intraventricular conduction delay, diffuse nonspecific T wave abnormalities.    Echocardiogram: 06/18/2021:  Normal LV systolic function  with visual EF 50-55%. Left ventricle cavity is normal in size. Mild left ventricular hypertrophy. Normal global wall motion. Doppler evidence of grade II (pseudonormal) diastolic dysfunction,  elevated LAP.  Left atrial cavity is mildly dilated.  Trileaflet aortic valve with no regurgitation. Severe aortic valve leaflet calcification. Moderate aortic valve stenosis. Peak velocity 3.31ms, Peak Gradient 39.8 mmHg, Mean Gradient 27 mmHg, AVA 0.8cm, DI 0.3.  Mild (Grade I) mitral regurgitation.  Moderate tricuspid regurgitation. Mild pulmonary hypertension. RVSP measures 49 mmHg.  Insignificant pericardial effusion. There is no hemodynamic significance.  IVC is dilated with a respiratory response of >50%.  Compared to study 12/06/2020: LVEF was 55-60% and now 50-55%, AS remains moderate, RVSP was  327mg and now 4932m.   Stress Testing: Lexiscan Tetrofosmin Stress Test 12/24/2020: Nondiagnostic ECG stress. The left ventricle is dilated in stress images more than rest images, LV end-diastolic volume 175287. There is mild diaphragmatic attenuation artifact in the inferior wall. Superimposed on this there is a moderate-sized moderate decrease in counts in the basal inferior and inferolateral wall suggestive of ischemia. Gated SPECT imaging of the left ventricle was abnormal, demonstrating hypokinesis of the mid inferolateral wall, mid inferior wall and basal inferolateral wall. Stress LV EF is mildly dysfunctional 48%.  No previous exam available for comparison. High risk study.   Left heart catheterization 01/15/2021: LV: Normal LV systolic function.  EDP mildly elevated at 20 mmHg.  There is 22 mmHg peak to peak pressure gradient across the aortic valve.  Findings are consistent with mild aortic stenosis. Very mild coronary calcification and minimal luminal irregularity in the coronary vessels.  Right dominant circulation. Impression: No significant coronary disease angiography.  Coronary calcification is evident.  Normal LVEF and mild aortic stenosis. 50 mL contrast used.  Carotid artery duplex 12/19/2020: Minimal stenosis in the right internal carotid artery (1-15%). Doppler velocity suggests stenosis in the right Minimal stenosis in the left internal carotid artery (1-15%). Doppler velocity suggests stenosis in the left external carotid artery (<50%). Mild heterogeneous plaque noted bilaterally.  Antegrade right vertebral artery flow. Antegrade left vertebral artery flow. External carotid artery stenosis is probably source of bruit. Follow up study is appropriate if clinically indicated.  LABORATORY DATA: External Labs: Collected: 11/29/2020 Creatinine 0.88 mg/dL. eGFR: 86 mL/min per 1.73 m Lipid profile: Total cholesterol 186, triglycerides 131, HDL 39, LDL 116, non HDL  147 Hemoglobin A1c: 7  IMPRESSION:    ICD-10-CM   1. Nonrheumatic aortic valve stenosis  I35.0 ECHOCARDIOGRAM COMPLETE    2. Benign hypertension  I10     3. Mixed hyperlipidemia  E78.2     4. Type 2 diabetes mellitus with hyperglycemia, without long-term current use of insulin (HCC)  E11.65     5. Former smoker  Z87.891       RECOMMENDATIONS: HomHagan Vanauken a 79 30o. male whose past medical history and cardiac risk factors include: Aortic stenosis, hypertension, hyperlipidemia, non-insulin-dependent diabetes mellitus type 2, former smoker, advanced age.  Nonrheumatic aortic valve stenosis Asymptomatic. Most recent echocardiogram notes moderate aortic stenosis with mild decrease in LVEF to 50-55%. Given the reduction in LVEF would like to reevaluate aortic valve and left ventricular function in 3 months. Echocardiogram will be ordered to evaluate for structural heart disease and left ventricular systolic function. Patient is asked to seek medical attention if he has symptoms of angina pectoris, congestive heart failure, or syncope.  Benign hypertension Office blood pressures are elevated. Home blood pressures according to the patient are very well  controlled. Medications reconciled. Will enroll the patient into principal care management for closer blood pressure monitoring given his underlying aortic stenosis.  Mixed hyperlipidemia Continue statin therapy. Does not endorse myalgias.  Type 2 diabetes mellitus with hyperglycemia, without long-term current use of insulin ([place]) Educated on the importance of glycemic control. Currently managed by primary care provider.   FINAL MEDICATION LIST END OF ENCOUNTER: No orders of the defined types were placed in this encounter.   Current Outpatient Medications:    acetaminophen (TYLENOL) 500 MG tablet, Take 500 mg by mouth every 8 (eight) hours as needed for moderate pain., Disp: , Rfl:    allopurinol (ZYLOPRIM) 300 MG tablet, Take  300 mg by mouth daily., Disp: , Rfl:    amLODipine (NORVASC) 10 MG tablet, Take 10 mg by mouth daily., Disp: , Rfl:    aspirin EC 81 MG tablet, Take 1 tablet (81 mg total) by mouth daily. Swallow whole. (Patient taking differently: Take 81 mg by mouth 2 (two) times a week. Swallow whole.), Disp: 90 tablet, Rfl: 3   atorvastatin (LIPITOR) 20 MG tablet, Take 1 tablet (20 mg total) by mouth 2 (two) times a week. (Patient taking differently: Take 20 mg by mouth See admin instructions. Take one tablet (20 mg) by mouth twice weekly - Monday and Thursday), Disp: 10 tablet, Rfl: 10   carvedilol (COREG) 3.125 MG tablet, Take 1 tablet (3.125 mg total) by mouth 2 (two) times daily with a meal., Disp: 60 tablet, Rfl: 11   cloNIDine (CATAPRES) 0.3 MG tablet, Take 0.6 mg by mouth 2 (two) times daily. , Disp: , Rfl:    hydrochlorothiazide (HYDRODIURIL) 25 MG tablet, Take 25 mg by mouth daily., Disp: , Rfl:    lisinopril (PRINIVIL,ZESTRIL) 40 MG tablet, Take 40 mg by mouth every evening., Disp: , Rfl:    metFORMIN (GLUCOPHAGE) 1000 MG tablet, Take 1 tablet (1,000 mg total) by mouth 2 (two) times daily with a meal., Disp: , Rfl:    pantoprazole (PROTONIX) 40 MG tablet, Take 40 mg by mouth daily., Disp: , Rfl:   Orders Placed This Encounter  Procedures   ECHOCARDIOGRAM COMPLETE     There are no Patient Instructions on file for this visit.   --Continue cardiac medications as reconciled in final medication list. --Return in about 3 months (around 10/28/2021) for Follow up aortic stenosis. Or sooner if needed. --Continue follow-up with your primary care physician regarding the management of your other chronic comorbid conditions.  Patient's questions and concerns were addressed to his satisfaction. He voices understanding of the instructions provided during this encounter.   This note was created using a voice recognition software as a result there may be grammatical errors inadvertently enclosed that do not  reflect the nature of this encounter. Every attempt is made to correct such errors.  Nicholas Shelton, [place], [facility]  Pager: [phone] Office: [phone]

## 2021-10-21 ENCOUNTER — Other Ambulatory Visit: Payer: Self-pay

## 2021-10-21 ENCOUNTER — Ambulatory Visit (HOSPITAL_COMMUNITY)
Admission: RE | Admit: 2021-10-21 | Discharge: 2021-10-21 | Disposition: A | Discharge status: Home / Self Care | Payer: Medicare Other | Source: Ambulatory Visit | Attending: Cardiology | Admitting: Cardiology

## 2021-10-21 DIAGNOSIS — I080 Rheumatic disorders of both mitral and aortic valves: Secondary | ICD-10-CM | POA: Diagnosis not present

## 2021-10-21 DIAGNOSIS — I350 Nonrheumatic aortic (valve) stenosis: Secondary | ICD-10-CM | POA: Diagnosis present

## 2021-10-21 LAB — ECHOCARDIOGRAM COMPLETE
AR max vel: 1.07 cm2
AV Area VTI: 0.94 cm2
AV Area mean vel: 1.03 cm2
AV Mean grad: 30 mmHg
AV Peak grad: 48.2 mmHg
Ao pk vel: 3.47 m/s
Area-P 1/2: 4.63 cm2
S' Lateral: 4.8 cm

## 2021-10-25 NOTE — Progress Notes (Signed)
Called patient, line busy, no message left.

## 2021-10-28 NOTE — Progress Notes (Signed)
Called and spoke with patient wife, she will be accompanying him to his OV on the 22nd.

## 2021-10-31 ENCOUNTER — Ambulatory Visit: Payer: Medicare Other | Admitting: Cardiology

## 2021-10-31 ENCOUNTER — Other Ambulatory Visit: Payer: Self-pay

## 2021-10-31 ENCOUNTER — Encounter: Payer: Self-pay | Admitting: Cardiology

## 2021-10-31 VITALS — BP 172/84 | HR 67 | Temp 98.1°F | Resp 17 | Ht 70.0 in | Wt 194.8 lb

## 2021-10-31 DIAGNOSIS — E782 Mixed hyperlipidemia: Secondary | ICD-10-CM

## 2021-10-31 DIAGNOSIS — I350 Nonrheumatic aortic (valve) stenosis: Secondary | ICD-10-CM

## 2021-10-31 DIAGNOSIS — Z87891 Personal history of nicotine dependence: Secondary | ICD-10-CM

## 2021-10-31 DIAGNOSIS — I5022 Chronic systolic (congestive) heart failure: Secondary | ICD-10-CM

## 2021-10-31 DIAGNOSIS — E1165 Type 2 diabetes mellitus with hyperglycemia: Secondary | ICD-10-CM

## 2021-10-31 DIAGNOSIS — I10 Essential (primary) hypertension: Secondary | ICD-10-CM

## 2021-10-31 MED ORDER — ENTRESTO 49-51 MG PO TABS: 1.0000 | ORAL_TABLET | Freq: Two times a day (BID) | ORAL | 0 refills | Status: DC

## 2021-10-31 NOTE — Progress Notes (Signed)
IDDickie Shelton, DOB [birth year], MRN [id number]  PCP:  Nicholas Sacramento, MD  Cardiologist:  Nicholas Kras, DO, [facility] (established care 12/13/2020) Cardiac Electrophysiologist: Dr. Virl Shelton.   Date: 10/31/21 Last Office Visit: 07/18/2021  Chief Complaint  Patient presents with   Follow-up    3 month   Aortic Stenosis    HPI  Nicholas Shelton is a 80 y.o. male who presents to the office with a chief complaint of " follow-up for aortic stenosis and review recent echo." Patient's past medical history and cardiovascular risk factors include: Aortic stenosis, hypertension, hyperlipidemia, non-insulin-dependent diabetes mellitus type 2, former smoker, advanced age.  He is referred to the office at the request of Nicholas Sacramento, MD for evaluation of aortic stenosis and preop clearance.  Patient presents to the office accompanied by his brother Nicholas Shelton and he provides verbal consent with regards to discussing his information in the presence of his brother.  Patient is being followed by the practice for the management of aortic stenosis.  He was last seen in the office in September 2022 at which time the shared decision was to proceed with an echocardiogram in 3 months given his severe asymptomatic aortic stenosis.  His recent 23[facility]monthe[facility] December 2022 [facility] and severe aortic stenosis results reviewed with him and his brother in great detail and noted below for further reference.  Clinically he denies any chest discomfort, heart failure symptoms, or syncope.  He continues to do his daily walking at least 20 to 30 minutes/day.  His blood pressures at home are well controlled and earlier this morning his BP was 140/77 mmHg and heart rate of 60 bpm.  Patient denies any hospitalizations or urgent care visits since her last office encounter.  ALLERGIES: No Known Allergies  MEDICATION LIST PRIOR TO VISIT: Current Meds  Medication  Sig   acetaminophen (TYLENOL) 500 MG tablet Take 500 mg by mouth every 8 (eight) hours as needed for moderate pain.   amLODipine (NORVASC) 10 MG tablet Take 10 mg by mouth daily.   aspirin EC 81 MG tablet Take 1 tablet (81 mg total) by mouth daily. Swallow whole. (Patient taking differently: Take 81 mg by mouth 2 (two) times a week. Swallow whole.)   atorvastatin (LIPITOR) 20 MG tablet Take 1 tablet (20 mg total) by mouth 2 (two) times a week. (Patient taking differently: Take 20 mg by mouth See admin instructions. Take one tablet (20 mg) by mouth twice weekly - Monday and Thursday)   carvedilol (COREG) 3.125 MG tablet Take 1 tablet (3.125 mg total) by mouth 2 (two) times daily with a meal.   cloNIDine (CATAPRES) 0.3 MG tablet Take 0.6 mg by mouth 2 (two) times daily.    hydrochlorothiazide (HYDRODIURIL) 25 MG tablet Take 25 mg by mouth daily.   metFORMIN (GLUCOPHAGE) 1000 MG tablet Take 1 tablet (1,000 mg total) by mouth 2 (two) times daily with a meal.   pantoprazole (PROTONIX) 40 MG tablet Take 40 mg by mouth daily.   sacubitril-valsartan (ENTRESTO) 49-51 MG Take 1 tablet by mouth 2 (two) times daily.   traZODone (DESYREL) 100 MG tablet Take 100 mg by mouth at bedtime as needed.   [DISCONTINUED] lisinopril (PRINIVIL,ZESTRIL) 40 MG tablet Take 40 mg by mouth every evening.     PAST MEDICAL HISTORY: Past Medical History:  Diagnosis Date   Abnormal blood chemistry test    Actinic keratosis  Arthritis    AV block    Blood transfusion without reported diagnosis    Bradycardia    Degeneration of lumbar or lumbosacral intervertebral disc    Deviated nasal septum    Diabetes mellitus without complication (HCC)    Gastroesophageal reflux disease without esophagitis    Generalized anxiety disorder    Gout with tophus    Headache    Hearing loss    Hyperlipidemia    Hypertension    Insomnia    Ischemia    Murmur    Nasal turbinate hypertrophy    Osteoarthritis of wrist    PAC  (premature atrial contraction)    Peptic ulcer    Peripheral neuropathy    PVC (premature ventricular contraction)    Seasonal allergic rhinitis due to pollen     PAST SURGICAL HISTORY: Past Surgical History:  Procedure Laterality Date   COLONOSCOPY     ELBOW SURGERY     pinched nerve on both arms   INGUINAL HERNIA REPAIR Right 10/30/2018   Procedure: OPEN REPAIR OF INCARCERATED RIGHT INGUINAL HERNIA WITH MESH;  Surgeon: Greer Pickerel, MD;  Location: [facility];  Service: General;  Laterality: Right;   LEFT HEART CATH AND CORONARY ANGIOGRAPHY N/A 01/15/2021   Procedure: LEFT HEART CATH AND CORONARY ANGIOGRAPHY;  Surgeon: Adrian Prows, MD;  Location: [facility];  Service: Cardiovascular;  Laterality: N/A;   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  SOCIAL HISTORY:  The patient  reports that he quit smoking about 27 years ago. His smoking use included cigarettes. He has a 7.50 pack-year smoking history. He has never used smokeless tobacco. He reports that he does not drink alcohol and does not use drugs.  REVIEW OF SYSTEMS: Review of Systems  Constitutional: Negative for chills and fever.  HENT:  Negative for hoarse voice and nosebleeds.        Difficulty hearing  Eyes:  Negative for discharge, double vision and pain.  Cardiovascular:  Negative for chest pain, claudication, dyspnea on exertion, leg swelling, near-syncope, orthopnea, palpitations, paroxysmal nocturnal dyspnea and syncope.  Respiratory:  Negative for hemoptysis and shortness of breath.   Musculoskeletal:  Negative for muscle cramps and myalgias.  Gastrointestinal:  Negative for abdominal pain, constipation, diarrhea, hematemesis, hematochezia, melena,  nausea and vomiting.  Neurological:  Negative for dizziness and light-headedness.   PHYSICAL EXAM: Vitals with BMI 10/31/2021 10/31/2021 07/18/2021  Height - 5' 10" -  Weight - 194 lbs 13 oz -  BMI - 34.28 -  Systolic 768 115 726  Diastolic 84 78 79  Pulse 67 67 57   CONSTITUTIONAL: Well-developed and well-nourished. No acute distress.  SKIN: Skin is warm and dry. No rash noted. No cyanosis. No pallor. No jaundice HEAD: Normocephalic and atraumatic.  EYES: No scleral icterus MOUTH/THROAT: Moist oral membranes.  NECK: No JVD present. No thyromegaly noted. Bilateral carotid bruits  LYMPHATIC: No visible cervical adenopathy.  CHEST Normal respiratory effort. No intercostal retractions  LUNGS: Clear to auscultation bilaterally. No stridor. No wheezes. No rales.  CARDIOVASCULAR: Regular rate and rhythm, positive S1-S2, SEM 2RICS, no rubs or gallops appreciated. ABDOMINAL: No apparent ascites.  EXTREMITIES: No peripheral edema, 2+ DP and PT pulses. HEMATOLOGIC: No  significant bruising NEUROLOGIC: Oriented to person, place, and time. Nonfocal. Normal muscle tone.  PSYCHIATRIC: Normal mood and affect. Normal behavior. Cooperative  CARDIAC DATABASE: EKG: 10/31/2021: Probable normal sinus rhythm cannot rule out ectopic atrial rhythm, IVCD suggestive of RBBB, diffuse nonspecific T wave abnormality.  No significant change compared to prior ECG.   Echocardiogram: 06/18/2021:  Normal LV systolic function with visual EF 50-55%. Left ventricle cavity is normal in size. Mild left ventricular hypertrophy. Normal global wall motion. Doppler evidence of grade II (pseudonormal) diastolic dysfunction,  elevated LAP.  Left atrial cavity is mildly dilated.  Trileaflet aortic valve with no regurgitation. Severe aortic valve leaflet calcification. Moderate aortic valve stenosis. Peak velocity 3.75ms, Peak Gradient 39.8 mmHg, Mean Gradient 27 mmHg, AVA 0.8cm, DI 0.3.  Mild (Grade I) mitral regurgitation.   Moderate tricuspid regurgitation. Mild pulmonary hypertension. RVSP measures 49 mmHg.  Insignificant pericardial effusion. There is no hemodynamic significance.  IVC is dilated with a respiratory response of >50%.  Compared to study 12/06/2020: LVEF was 55-60% and now 50-55%, AS remains moderate, RVSP was 352mg and now 4920m.  10/21/2021:  1. Compared to echo report from September 2022, LVEF is depressed and AS is severe.   2. Global longitudinal strain is -13.4%. Left ventricular ejection fraction, by estimation, is 40%. The left ventricle has moderately decreased function. The left ventricle demonstrates global hypokinesis. The left ventricular internal cavity size was mildly dilated. There is mild left ventricular hypertrophy.   3. Right ventricular systolic function is mildly reduced. The right ventricular size is normal.   4. Left atrial size was moderately dilated.   5. Right atrial size was mildly dilated.   6. Mild mitral valve regurgitation.   7. AV is thickened, calcified with restricted motion motion. Peak and mean gradients through the valve are 48 and 30 mm Hg respectively. AVA (VTI) is 0.99cm2. Dimensionless index is 0.25 All consistent with severe  AS. . AMarland Kitchenrtic valve regurgitation is not  visualized.   8. The inferior vena cava is dilated in size with <50% respiratory variability, suggesting right atrial pressure of 15 mmHg.   Stress Testing: Lexiscan Tetrofosmin Stress Test 12/24/2020: Nondiagnostic ECG stress. The left ventricle is dilated in stress images more than rest images, LV end-diastolic volume 175056. There is mild diaphragmatic attenuation artifact in the inferior wall. Superimposed on this there is a moderate-sized moderate decrease in counts in the basal inferior and inferolateral wall suggestive of ischemia. Gated SPECT imaging of the left ventricle was abnormal, demonstrating hypokinesis of the mid inferolateral wall, mid inferior wall and basal inferolateral  wall. Stress LV EF is mildly dysfunctional 48%.  No previous exam available for comparison. High risk study.   Left heart catheterization 01/15/2021: LV: Normal LV systolic function.  EDP mildly elevated at 20 mmHg.  There is 22 mmHg peak to peak pressure gradient across the aortic valve.  Findings are consistent with mild aortic stenosis. Very mild coronary calcification and minimal luminal irregularity in the coronary vessels.  Right dominant circulation. Impression: No significant coronary disease angiography.  Coronary calcification is evident.  Normal LVEF and mild aortic stenosis. 50 mL contrast used.  Carotid artery duplex 12/19/2020: Minimal stenosis in the right internal carotid artery (1-15%). Doppler velocity suggests stenosis in the right Minimal stenosis in the left internal carotid artery (1-15%). Doppler velocity suggests stenosis in the left external carotid artery (<50%). Mild heterogeneous plaque noted bilaterally.  Antegrade right vertebral artery flow. Antegrade left vertebral artery flow. External carotid artery stenosis  is probably source of bruit. Follow up study is appropriate if clinically indicated.  LABORATORY DATA: External Labs: Collected: 11/29/2020 Creatinine 0.88 mg/dL. eGFR: 86 mL/min per 1.73 m Lipid profile: Total cholesterol 186, triglycerides 131, HDL 39, LDL 116, non HDL 147 Hemoglobin A1c: 7  IMPRESSION:    ICD-10-CM   1. Chronic HFrEF (heart failure with reduced ejection fraction) (HCC)  I50.22 sacubitril-valsartan (ENTRESTO) 49-51 MG    Ambulatory referral to Cardiology    Pro b natriuretic peptide (BNP)    Basic metabolic panel    Magnesium    2. Nonrheumatic aortic valve stenosis  I35.0 Ambulatory referral to Cardiology    3. Benign hypertension  I10 EKG 12-Lead    4. Mixed hyperlipidemia  E78.2     5. Type 2 diabetes mellitus with hyperglycemia, without long-term current use of insulin (HCC)  E11.65     6. Former smoker  Z87.891        RECOMMENDATIONS: Ermias Tomeo is a 80 y.o. male whose past medical history and cardiac risk factors include: Aortic stenosis, hypertension, hyperlipidemia, non-insulin-dependent diabetes mellitus type 2, former smoker, advanced age.  Chronic HFrEF (heart failure with reduced ejection fraction) (HCC) Stage B, NYHA class II. Discontinue lisinopril. After 48-hour washout patient instructed to start Entresto 49/51 mg p.o. twice daily.  30-day patient assistance voucher provided.  Samples also provided. Recommended discontinuing amlodipine now and transitioning to other GDMT.  However, patient would like to change medications and slowly. Recommend transitioning him off of amlodipine and up titration of carvedilol. Blood work in 1 week to evaluate kidney function and electrolytes. Strict I's and O's, daily weights, low-salt diet recommended.  Nonrheumatic aortic valve stenosis Asymptomatic severe aortic stenosis Patient was monitored with routine echocardiograms to evaluate for disease progression. However, recent echocardiogram notes a notable change in LVEF given the reduced LVEF recommend SAVR versus TAVR evaluation. Will refer to Dr. Sherren Mocha for evaluation.  Benign hypertension Office blood pressures are not well controlled. Patient states that his home blood pressures are better controlled. Recommended to keep a log of his blood pressures. Will be uptitrating his GDMT in a stepwise fashion.  Mixed hyperlipidemia Continue statin therapy.  Type 2 diabetes mellitus with hyperglycemia, without long-term current use of insulin (HCC) Currently being managed by PCP. Educated importance of glycemic control Patient is currently on statin therapy and ACE inhibitor being transitioned to [place]. Consider transitioning from hydrochlorothiazide to Farxiga at the next office visit.  FINAL MEDICATION LIST END OF ENCOUNTER: Meds ordered this encounter  Medications   sacubitril-valsartan  (ENTRESTO) 49-51 MG    Sig: Take 1 tablet by mouth 2 (two) times daily.    Dispense:  180 tablet    Refill:  0     Current Outpatient Medications:    acetaminophen (TYLENOL) 500 MG tablet, Take 500 mg by mouth every 8 (eight) hours as needed for moderate pain., Disp: , Rfl:    amLODipine (NORVASC) 10 MG tablet, Take 10 mg by mouth daily., Disp: , Rfl:    aspirin EC 81 MG tablet, Take 1 tablet (81 mg total) by mouth daily. Swallow whole. (Patient taking differently: Take 81 mg by mouth 2 (two) times a week. Swallow whole.), Disp: 90 tablet, Rfl: 3   atorvastatin (LIPITOR) 20 MG tablet, Take 1 tablet (20 mg total) by mouth 2 (two) times a week. (Patient taking differently: Take 20 mg by mouth See admin instructions. Take one tablet (20 mg) by mouth twice weekly - Monday and Thursday), Disp:  10 tablet, Rfl: 10   carvedilol (COREG) 3.125 MG tablet, Take 1 tablet (3.125 mg total) by mouth 2 (two) times daily with a meal., Disp: 60 tablet, Rfl: 11   cloNIDine (CATAPRES) 0.3 MG tablet, Take 0.6 mg by mouth 2 (two) times daily. , Disp: , Rfl:    hydrochlorothiazide (HYDRODIURIL) 25 MG tablet, Take 25 mg by mouth daily., Disp: , Rfl:    metFORMIN (GLUCOPHAGE) 1000 MG tablet, Take 1 tablet (1,000 mg total) by mouth 2 (two) times daily with a meal., Disp: , Rfl:    pantoprazole (PROTONIX) 40 MG tablet, Take 40 mg by mouth daily., Disp: , Rfl:    sacubitril-valsartan (ENTRESTO) 49-51 MG, Take 1 tablet by mouth 2 (two) times daily., Disp: 180 tablet, Rfl: 0   traZODone (DESYREL) 100 MG tablet, Take 100 mg by mouth at bedtime as needed., Disp: , Rfl:   Orders Placed This Encounter  Procedures   Pro b natriuretic peptide (BNP)   Basic metabolic panel   Magnesium   Ambulatory referral to Cardiology   EKG 12-Lead     There are no Patient Instructions on file for this visit.   --Continue cardiac medications as reconciled in final medication list. --Return in about 3 months (around 01/29/2022) for  Follow up AS . Or sooner if needed. --Continue follow-up with your primary care physician regarding the management of your other chronic comorbid conditions.  Patient's questions and concerns were addressed to his satisfaction. He voices understanding of the instructions provided during this encounter.   This note was created using a voice recognition software as a result there may be grammatical errors inadvertently enclosed that do not reflect the nature of this encounter. Every attempt is made to correct such errors.  Nicholas Shelton, [place], [facility]  Pager: [phone] Office: [phone]

## 2021-11-05 ENCOUNTER — Telehealth: Payer: Self-pay

## 2021-11-07 NOTE — Telephone Encounter (Signed)
Please apply for patient assistance and in the meantime they should have gotten the first 30days for free as they were given the coupon code and provide samples until we hear back (if possible).   ST

## 2021-11-08 NOTE — Telephone Encounter (Signed)
Done, samples were left at the front desk for pt to pick up

## 2021-11-12 ENCOUNTER — Other Ambulatory Visit: Payer: Self-pay | Admitting: Cardiology

## 2021-11-13 LAB — BASIC METABOLIC PANEL
BUN/Creatinine Ratio: 20 (ref 10–24)
BUN: 17 mg/dL (ref 8–27)
CO2: 22 mmol/L (ref 20–29)
Calcium: 9.3 mg/dL (ref 8.6–10.2)
Chloride: 105 mmol/L (ref 96–106)
Creatinine, Ser: 0.85 mg/dL (ref 0.76–1.27)
Glucose: 149 mg/dL — ABNORMAL HIGH (ref 70–99)
Potassium: 4.5 mmol/L (ref 3.5–5.2)
Sodium: 141 mmol/L (ref 134–144)
eGFR: 87 mL/min/{1.73_m2} (ref >59)

## 2021-11-13 LAB — MAGNESIUM: Magnesium: 1.9 mg/dL (ref 1.6–2.3)

## 2021-11-13 LAB — PRO B NATRIURETIC PEPTIDE: NT-Pro BNP: 1918 pg/mL — ABNORMAL HIGH (ref 0–486)

## 2021-11-14 ENCOUNTER — Other Ambulatory Visit: Payer: Self-pay

## 2021-11-14 DIAGNOSIS — I5022 Chronic systolic (congestive) heart failure: Secondary | ICD-10-CM

## 2021-11-17 ENCOUNTER — Other Ambulatory Visit: Payer: Self-pay | Admitting: Cardiology

## 2021-11-17 DIAGNOSIS — I5022 Chronic systolic (congestive) heart failure: Secondary | ICD-10-CM

## 2021-11-17 MED ORDER — ENTRESTO 97-103 MG PO TABS: 1.0000 | ORAL_TABLET | Freq: Two times a day (BID) | ORAL | 0 refills | Status: DC

## 2021-11-20 NOTE — Progress Notes (Signed)
I spoke to Melvyn Novas which is patient's sister in law and she is aware of results and the change of his medications

## 2021-11-21 ENCOUNTER — Other Ambulatory Visit: Payer: Self-pay

## 2021-11-21 ENCOUNTER — Ambulatory Visit: Payer: Medicare Other | Admitting: Cardiovascular Disease

## 2021-11-21 ENCOUNTER — Encounter: Payer: Self-pay | Admitting: Cardiovascular Disease

## 2021-11-21 VITALS — BP 170/86 | HR 77 | Ht 71.0 in | Wt 197.0 lb

## 2021-11-21 DIAGNOSIS — I350 Nonrheumatic aortic (valve) stenosis: Secondary | ICD-10-CM | POA: Diagnosis not present

## 2021-11-21 NOTE — Progress Notes (Signed)
[facility]   MULTIDISCIPLINARY HEART VALVE TEAM  Date:  11/21/2021   ID:  Nicholas Shelton, DOB 01-Jun-1941, MRN [id number]  PCP:  Christain Sacramento, MD   Chief Complaint  Patient presents with   Aortic Stenosis     HISTORY OF PRESENT ILLNESS: Nicholas Shelton is a 82 y.o. male who presents for evaluation of aortic stenosis, referred by Dr Terri Skains.   He is here with his sister-in-law today.  His wife has been dealing with some medical problems.  They asked that his sister-in-law to be contacted for logistics of scheduling tests, etc. Her name is Nicholas Shelton and her contact # is [phone].   The patient does not recall being told of a heart murmur until last year.  He has been followed now for aortic stenosis for that same period of time.  He was felt to have moderate aortic stenosis but on a recent echocardiogram in December 2022, his LVEF was decreased and aortic stenosis felt to be severe.  The patient is referred for consideration of TAVR in light of his worsening left ventricular systolic function.  The patient is remarkably active.  He does exercises for 1 to 2 hours/day and denies any exertional symptoms.  He denies chest pain, chest pressure, shortness of breath, heart palpitations, lightheadedness, fatigue, or syncope. The patient has full dentures and has no active problems with his mouth or gums.  His recent echocardiogram is reviewed and it shows moderately depressed LV systolic function with an LVEF of 40%.  There is global hypokinesis present.  The aortic valve is severely thickened, calcified, and restricted.  Peak and mean transvalvular gradients are 48 and 30 mmHg, respectively.  The dimensionless index is 0.25 and the calculated aortic valve area is 0.99 cm.  There is no other significant valvular disease identified.  RV function is normal.  Cardiac catheterization performed last year showed findings of widely patent coronary arteries with no significant obstruction.  Past  Medical History:  Diagnosis Date   Abnormal blood chemistry test    Actinic keratosis    Arthritis    AV block    Blood transfusion without reported diagnosis    Bradycardia    Degeneration of lumbar or lumbosacral intervertebral disc    Deviated nasal septum    Diabetes mellitus without complication (HCC)    Gastroesophageal reflux disease without esophagitis    Generalized anxiety disorder    Gout with tophus    Headache    Hearing loss    Hyperlipidemia    Hypertension    Insomnia    Ischemia    Murmur    Nasal turbinate hypertrophy    Osteoarthritis of wrist    PAC (premature atrial contraction)    Peptic ulcer    Peripheral neuropathy    PVC (premature ventricular contraction)    Seasonal allergic rhinitis due to pollen     Current Outpatient Medications  Medication Sig Dispense Refill   acetaminophen (TYLENOL) 500 MG tablet Take 500 mg by mouth every 8 (eight) hours as needed for moderate pain.     atorvastatin (LIPITOR) 20 MG tablet Take 1 tablet (20 mg total) by mouth 2 (two) times a week. (Patient taking differently: Take 20 mg by mouth See admin instructions. Take one tablet (20 mg) by mouth twice weekly - Monday and Thursday) 10 tablet 10   carvedilol (COREG) 3.125 MG tablet Take 1 tablet (3.125 mg total) by mouth 2 (two) times daily with a meal. 60 tablet 11  cloNIDine (CATAPRES) 0.3 MG tablet Take 0.6 mg by mouth 2 (two) times daily.      hydrochlorothiazide (HYDRODIURIL) 25 MG tablet Take 25 mg by mouth daily.     metFORMIN (GLUCOPHAGE) 1000 MG tablet Take 1 tablet (1,000 mg total) by mouth 2 (two) times daily with a meal.     pantoprazole (PROTONIX) 40 MG tablet Take 40 mg by mouth daily.     sacubitril-valsartan (ENTRESTO) 97-103 MG Take 1 tablet by mouth 2 (two) times daily. 180 tablet 0   traZODone (DESYREL) 100 MG tablet Take 100 mg by mouth at bedtime as needed.     aspirin EC 81 MG tablet Take 1 tablet (81 mg total) by mouth daily. Swallow whole.  (Patient not taking: Reported on 11/21/2021) 90 tablet 3   No current facility-administered medications for this visit.    ALLERGIES:   Patient has no known allergies.   SOCIAL HISTORY:  The patient  reports that he quit smoking about 27 years ago. His smoking use included cigarettes. He has a 7.50 pack-year smoking history. He has never used smokeless tobacco. He reports that he does not drink alcohol and does not use drugs.   FAMILY HISTORY:  The patient's family history includes Heart attack in his mother.   REVIEW OF SYSTEMS:  Positive for mild memory problems.   All other systems are reviewed and negative.   PHYSICAL EXAM: VS:  BP (!) 170/86 Comment: 170/76 in left arm at 3:47 pm   Pulse 77    Ht 5\' 11"  (1.803 m)    Wt 197 lb (89.4 kg)    SpO2 99%    BMI 27.48 kg/m  , BMI Body mass index is 27.48 kg/m. GEN: Well nourished, well developed, in no acute distress HEENT: normal Neck: No JVD. carotids 2+ with bilateral bruits Cardiac: The heart is RRR with grade 3/6 mid peaking systolic murmur at the RUSB, no diastolic murmur.  No edema. Pedal pulses 2+ = bilaterally  Respiratory:  clear to auscultation bilaterally GI: soft, nontender, nondistended, + BS MS: no deformity or atrophy Skin: warm and dry, no rash Neuro:  Strength and sensation are intact Psych: euthymic mood, full affect  EKG:  EKG from 10/31/21 reviewed and demonstrates NSR with nonspecific IVCD  RECENT LABS: 02/06/2021: ALT 13; Hemoglobin 12.2; Platelets 185 11/12/2021: BUN 17; Creatinine, Ser 0.85; Magnesium 1.9; NT-Pro BNP 1,918; Potassium 4.5; Sodium 141  01/04/2021: Cholesterol, Total 183; HDL 41; LDL Chol Calc (NIH) 117; Triglycerides 138   Estimated Creatinine Clearance: 71.4 mL/min (by C-G formula based on SCr of 0.85 mg/dL).   Wt Readings from Last 3 Encounters:  11/21/21 197 lb (89.4 kg)  10/31/21 194 lb 12.8 oz (88.4 kg)  07/18/21 198 lb (89.8 kg)     CARDIAC STUDIES: Echo:  1. Compared to echo report  from September 2022, LVEF is depressed and AS  is severe.   2. Global longitudinal strain is -13.4%. Left ventricular ejection  fraction, by estimation, is 40%%. The left ventricle has moderately  decreased function. The left ventricle demonstrates global hypokinesis.  The left ventricular internal cavity size was  mildly dilated. There is mild left ventricular hypertrophy.   3. Right ventricular systolic function is mildly reduced. The right  ventricular size is normal.   4. Left atrial size was moderately dilated.   5. Right atrial size was mildly dilated.   6. Mild mitral valve regurgitation.   7. AV is thickened, calcified with restricted motion motion. Peak and  mean gradients through the valve are 48 and 30 mm Hg respectively. AVA  (VTI) is 0.99cm2. Dimensionless index is 0.25 All consistent with severe  AS. Marland Kitchen Aortic valve regurgitation is not   visualized.   8. The inferior vena cava is dilated in size with <50% respiratory  variability, suggesting right atrial pressure of 15 mmHg.   LEFT VENTRICLE  PLAX 2D  LVIDd:         5.30 cm   Diastology  LVIDs:         4.80 cm   LV e' medial:    5.70 cm/s  LV PW:         1.30 cm   LV E/e' medial:  25.1  LV IVS:        0.90 cm   LV e' lateral:   5.93 cm/s  LVOT diam:     2.20 cm   LV E/e' lateral: 24.1  LV SV:         84  LV SV Index:   40        2D Longitudinal Strain  LVOT Area:     3.80 cm  2D Strain GLS (A2C):   -15.0 %                           2D Strain GLS (A3C):   -11.9 %                           2D Strain GLS (A4C):   -13.4 %                           2D Strain GLS Avg:     -13.4 %                              3D Volume EF:                           3D EF:        41 %                           LV EDV:       216 ml                           LV ESV:       127 ml                           LV SV:        90 ml   RIGHT VENTRICLE             IVC  RV Basal diam:  5.00 cm     IVC diam: 2.00 cm  RV Mid diam:    4.20 cm  RV S  prime:     14.60 cm/s  TAPSE (M-mode): 2.6 cm  RVSP:           54.5 mmHg   LEFT ATRIUM              Index        RIGHT ATRIUM           Index  LA diam:  4.50 cm  2.17 cm/m   RA Pressure: 8.00 mmHg  LA Vol (A2C):   99.2 ml  47.73 ml/m  RA Area:     21.70 cm  LA Vol (A4C):   93.0 ml  44.75 ml/m  RA Volume:   72.70 ml  34.98 ml/m  LA Biplane Vol: 100.0 ml 48.11 ml/m   AORTIC VALVE  AV Area (Vmax):    1.07 cm  AV Area (Vmean):   1.03 cm  AV Area (VTI):     0.94 cm  AV Vmax:           347.00 cm/s  AV Vmean:          241.000 cm/s  AV VTI:            0.896 m  AV Peak Grad:      48.2 mmHg  AV Mean Grad:      30.0 mmHg  LVOT Vmax:         98.00 cm/s  LVOT Vmean:        65.500 cm/s  LVOT VTI:          0.221 m  LVOT/AV VTI ratio: 0.25     AORTA  Ao Root diam: 3.50 cm  Ao Asc diam:  3.10 cm   MITRAL VALVE                TRICUSPID VALVE  MV Area (PHT): 4.63 cm     TR Peak grad:   46.5 mmHg  MV Decel Time: 164 msec     TR Vmax:        341.00 cm/s  MV E velocity: 143.00 cm/s  Estimated RAP:  8.00 mmHg  MV A velocity: 50.80 cm/s   RVSP:           54.5 mmHg  MV E/A ratio:  2.81                              SHUNTS                              Systemic VTI:  0.22 m                              Systemic Diam: 2.20 cm   Cardiac Cath:  Left heart catheterization 01/15/2021: LV: Normal LV systolic function.  EDP mildly elevated at 20 mmHg.  There is 22 mmHg peak to peak pressure gradient across the aortic valve.  Findings are consistent with mild aortic stenosis. Very mild coronary calcification and minimal luminal irregularity in the coronary vessels.  Right dominant circulation.   Impression: No significant coronary disease angiography.  Coronary calcification is evident.  Normal LVEF and mild aortic stenosis. 50 mL contrast used.  STS RISK CALCULATOR: Isolated AVR Risk of Mortality: 1.511% Renal Failure: 1.920% Permanent Stroke: 1.667% Prolonged Ventilation: 5.199% DSW  Infection: 0.089% Reoperation: 4.178% Morbidity or Mortality: 10.019% Short Length of Stay: 35.440% Long Length of Stay: 4.891%  ASSESSMENT AND PLAN: 39.  81 yo male with severe, stage D2 aortic stenosis associated with minimal symptoms. However, he has had progressive LV dysfunction noted in an absence of coronary artery disease.  The patient's LVEF in January 2022 was 55 to 60%.  In August 2022 his LVEF was felt to be 50 to 55%.  Now, on his most  recent echocardiogram, LVEF is estimated at 40%. In addition, his Pro-BNP is markedly elevated at 1918, further confirming hemodynamically significant aortic stenosis.   I have reviewed the natural history of aortic stenosis with the patient and their family members who are present today. We have discussed the limitations of medical therapy and the poor prognosis associated with symptomatic aortic stenosis. We have reviewed potential treatment options, including palliative medical therapy, conventional surgical aortic valve replacement, and transcatheter aortic valve replacement. We discussed treatment options in the context of the patient's specific comorbid medical conditions.  I reviewed his echo images from his recent study.  Findings are detailed above in the HPI.  He does have mild to moderate LV systolic dysfunction now with severe calcification and restriction of the aortic valve leaflets, peak and mean gradients of 48 and 30 mmHg, respectively, and a dimensionless index of 0.25.  In light of his worsening LV function, it seems reasonable to intervene on his aortic valve in order to prevent progressive LV remodeling and dysfunction.  I reviewed the TAVR procedure in detail with the patient and his sister-in-law today.  I demonstrated a procedural animation and went through each of the major steps of the procedure.  We discussed further evaluation with CT angiography studies of the heart and the chest, abdomen, and pelvis.  Once his CTA studies are  completed, he will be referred for formal cardiac surgical consultation as part of a multidisciplinary team approach to his care.  In the interim, he is advised to avoid strenuous activity but he will be able to continue his light exercises he appears asymptomatic with this.   Deatra James 11/21/2021 4:44 PM     [address] [place] [address]  [phone] (office) [phone] (fax)

## 2021-11-21 NOTE — Progress Notes (Signed)
Pre Surgical Assessment: 5 M Walk Test  52M=16.110ft  5 Meter Walk Test- trial 1: 5.12 seconds 5 Meter Walk Test- trial 2: 4.71 seconds 5 Meter Walk Test- trial 3: 4.20 seconds 5 Meter Walk Test Average: 4.68 seconds

## 2021-11-21 NOTE — Patient Instructions (Signed)
Medication Instructions:  Your physician recommends that you continue on your current medications as directed. Please refer to the Current Medication list given to you today.  *If you need a refill on your cardiac medications before your next appointment, please call your pharmacy*   Lab Work: NONE  Testing/Procedures: CT and Surgical Consult w/ Dr. Cyndia Bent   Follow-Up: Structural will contact you for next appointment   We recommend signing up for the patient portal called "MyChart".  Sign up information is provided on this After Visit Summary.  MyChart is used to connect with patients for Virtual Visits (Telemedicine).  Patients are able to view lab/test results, encounter notes, upcoming appointments, etc.  Non-urgent messages can be sent to your provider as well.   To learn more about what you can do with MyChart, go to [email or web address].   }    Other Instructions See separate letter with details

## 2021-11-22 ENCOUNTER — Telehealth: Payer: Self-pay

## 2021-11-22 NOTE — Telephone Encounter (Signed)
Pts sister in law called and stated that the pt is having a hard time paying for Entresto. Advised them to come pick up a patient assistance packet. They also are concerned because the pt is taking carvedilol. She wants to make sure the pt is going to be okay with the two BP medications. Please advise.

## 2021-11-22 NOTE — Telephone Encounter (Signed)
Yes, that will be fine.   Please help him fill out the patient assistance form to see if he qualifies for additional aid  Otherwise we will have to transition him back to losartan and diuretic therapy.  Dr. Terri Skains

## 2021-11-25 NOTE — Telephone Encounter (Signed)
Pt is coming to pick up pt assistance form.

## 2021-11-28 ENCOUNTER — Telehealth: Payer: Self-pay | Admitting: Cardiology

## 2021-11-28 NOTE — Telephone Encounter (Signed)
Patient's wife Nicholas Shelton says she has a few questions regarding patient assistance forms. She would like for someone to call her back at [phone].

## 2021-11-28 NOTE — Telephone Encounter (Signed)
Called pt and explain to her about how the PA works.

## 2021-12-05 ENCOUNTER — Ambulatory Visit (HOSPITAL_COMMUNITY)
Admission: RE | Admit: 2021-12-05 | Discharge: 2021-12-05 | Disposition: A | Discharge status: Home / Self Care | Payer: Medicare Other | Source: Ambulatory Visit | Attending: Cardiovascular Disease | Admitting: Cardiovascular Disease

## 2021-12-05 ENCOUNTER — Other Ambulatory Visit: Payer: Self-pay

## 2021-12-05 ENCOUNTER — Encounter (HOSPITAL_COMMUNITY): Payer: Self-pay

## 2021-12-05 DIAGNOSIS — I350 Nonrheumatic aortic (valve) stenosis: Secondary | ICD-10-CM | POA: Insufficient documentation

## 2021-12-05 IMAGING — CT CT HEART MORP W/ CTA COR W/ SCORE W/ CA W/CM &/OR W/O CM
2 of 7 series · 11 of 20 positions shown, 13 images · IV contrast (APPLIED)
Comparison: CTA chest [DATE].
COMPARISON: CTA chest [DATE].

Addendum:
EXAM:
OVER-READ INTERPRETATION  CT CHEST

The following report is an over-read performed by radiologist Dr.
ADEOLA [REDACTED] on [DATE]. This
over-read does not include interpretation of cardiac or coronary
anatomy or pathology. The coronary calcium score/coronary CTA
interpretation by the cardiologist is attached.
CLINICAL DATA: Severe Aortic Stenosis.
Cardiac TAVR CT
TECHNIQUE: A non-contrast, gated CT scan was obtained with axial slices of 3 mm
through the heart for aortic valve calcium scoring. A 110 kV
retrospective, gated, contrast cardiac scan was obtained. Gantry
rotation speed was 250 msecs and collimation was 0.6 mm.
Nitroglycerin was not given. The 3D data set was reconstructed in 5%
intervals of the 0-95% of the R-R cycle. Systolic and diastolic
phases were analyzed on a dedicated workstation using MPR, MIP, and
VRT modes. The patient received 100 cc of contrast.

[Series 10: 0-90% · axial · 0.39mm/px · z∈[-182,-28]mm · 5 of 3860 slices shown]
[im 644/3860  vessel]
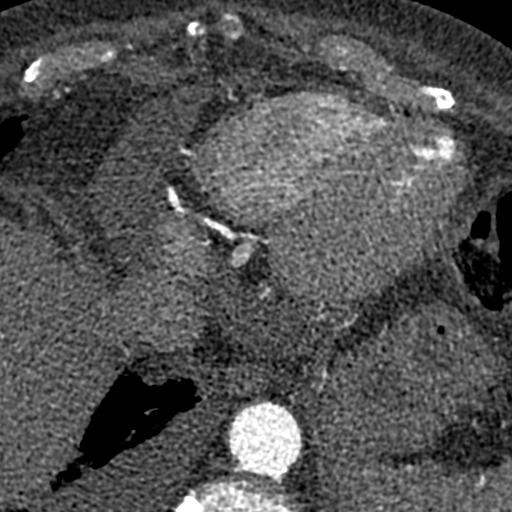
[im 1287/3860  vessel]
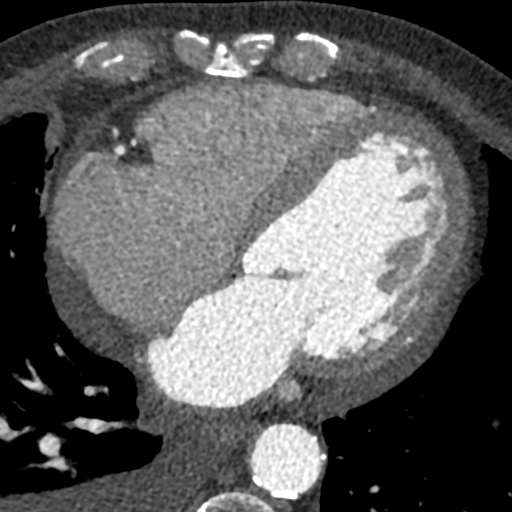
[im 1930/3860  vessel]
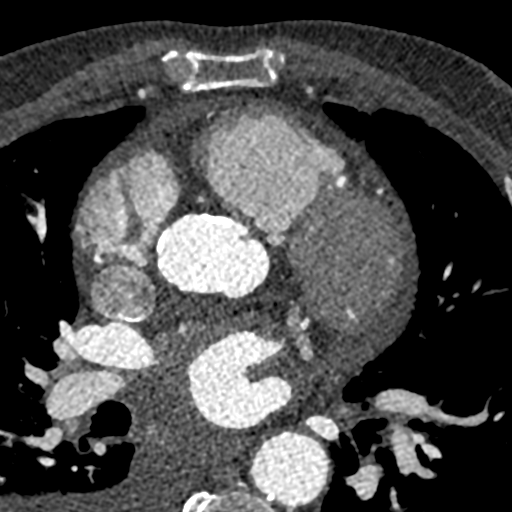
[im 2573/3860  vessel]
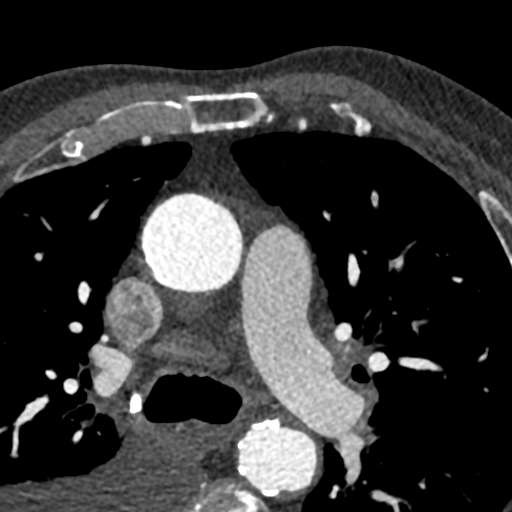
[im 3216/3860  vessel]
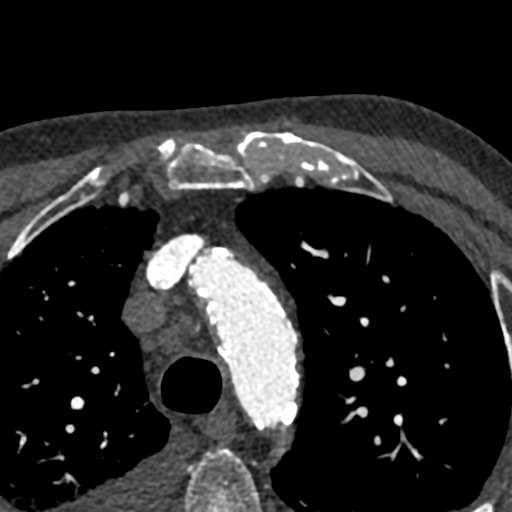

[Series 11: 5-95% · axial · 0.39mm/px · z∈[-188,-22]mm · 6 of 3860 slices shown, 8 images]
[im 552/3860  vessel]
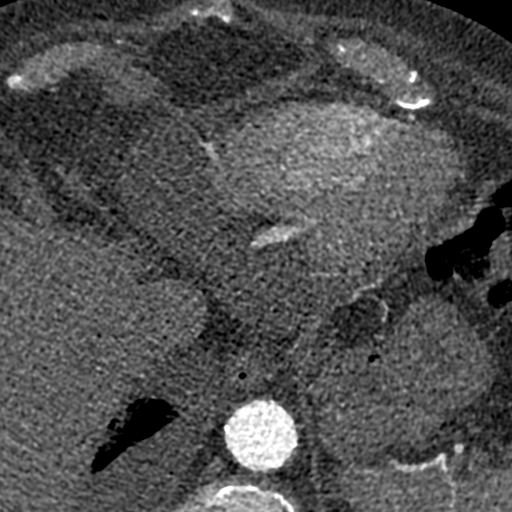
[im 552/3860  lung]
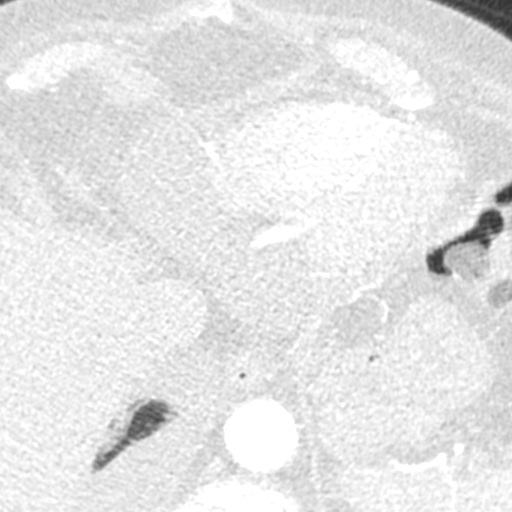
[im 1103/3860  vessel]
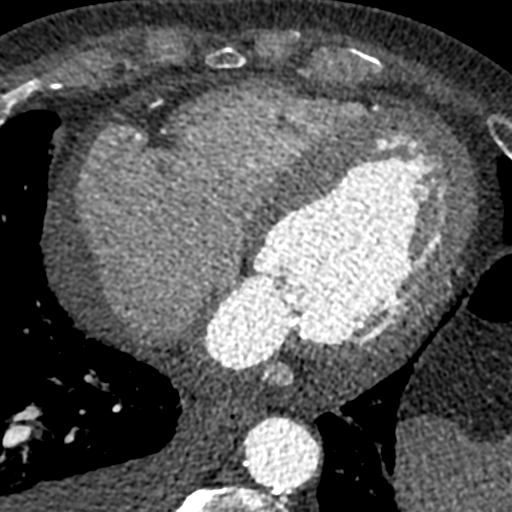
[im 1654/3860  vessel]
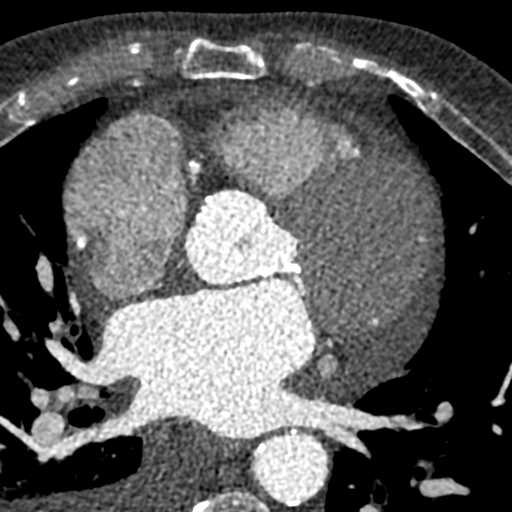
[im 2206/3860  vessel]
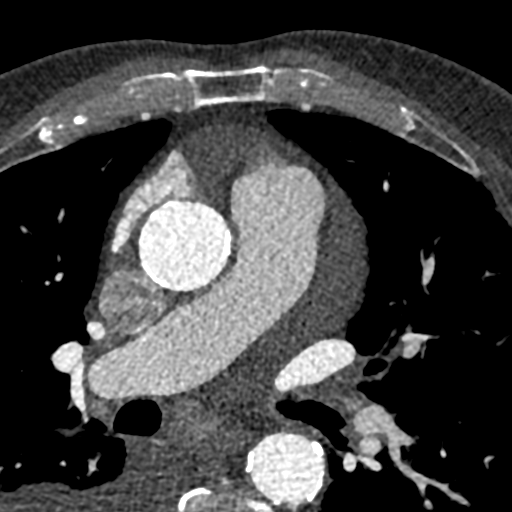
[im 2757/3860  vessel]
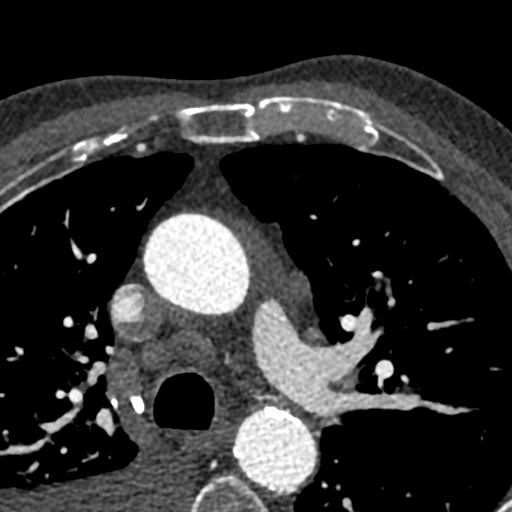
[im 2757/3860  lung]
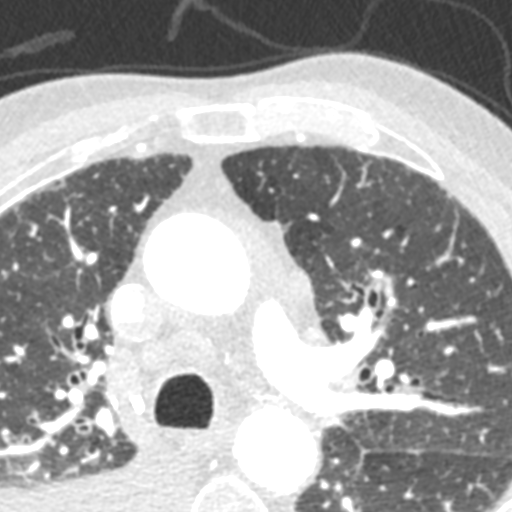
[im 3308/3860  vessel]
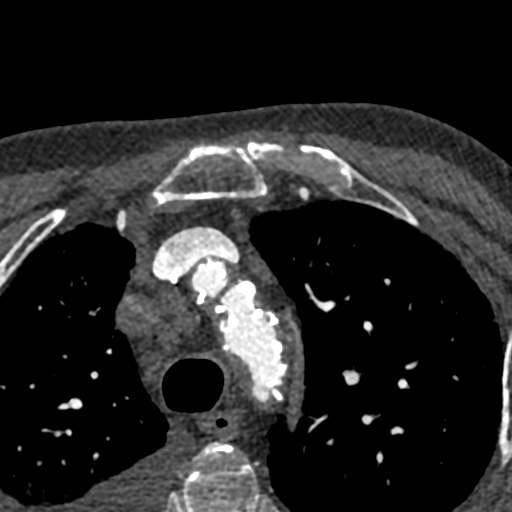

[11 of 20 positions shown; findings below may reference images not displayed]

FINDINGS: Extracardiac findings will be described separately under dictation
for contemporaneously obtained CTA chest, abdomen and pelvis.
IMPRESSION: Please see separate dictation for contemporaneously obtained CTA
chest, abdomen and pelvis [DATE] for full description of
relevant extracardiac findings.
FINDINGS: Image quality: Excellent.

Noise artifact is: Limited.

Valve Morphology: Tricuspid aortic valve. Severely calcified with
diffuse calcium. Severely restricted leaflet motion in systole.

Aortic Valve Calcium score: [7X]

Aortic annular dimension:

Phase assessed: 15%

Annular area: 553 mm2

Annular perimeter: 85.2 mm

Max diameter: 30.1 mm

Min diameter: 24.2 mm

Annular and subannular calcification: Mild single calcification
under the LCC.

Membranous septum length: 11.8 mm

Optimal coplanar projection: LAO 8 ADEOLA 9

Coronary Artery Height above Annulus:

Left Main: 16.4 mm

Right Coronary: 25.5 mm

Sinus of Valsalva Measurements:

Non-coronary: 36 mm

Right-coronary: 36 mm

Left-coronary: 36 mm

Sinus of Valsalva Height:

Non-coronary: 29.1 mm

Right-coronary: 28.8 mm

Left-coronary: 25.9 mm

Sinotubular Junction: 31 mm.  Circumferential calcifications noted.

Ascending Thoracic Aorta: 38 mm

Coronary Arteries: Normal coronary origin. Right dominance. The
study was performed without use of NTG and is insufficient for
plaque evaluation. Please refer to recent cardiac catheterization
for coronary assessment. 3-vessel coronary calcifications noted.

Cardiac Morphology:

Right Atrium: Right atrial size is dilated.

Right Ventricle: The right ventricular cavity is dilated.

Left Atrium: Left atrial size is dilated.  Small, hypoplastic ADEOLA.

Left Ventricle: The ventricular cavity size is dilated. There are no
stigmata of prior infarction. There is no abnormal filling defect.
Mildly reduced left ventricular function, LVEF=41%. Global
hypokinesis.

Pulmonary arteries: Dilated, suggest of pulmonary hypertension. No
proximal filling defect.

Pulmonary veins: Normal pulmonary venous drainage.

Pericardium: Normal thickness. Small circumferential pericardial
effusion.

Mitral Valve: The mitral valve is normal structure without
significant calcification.

Extra-cardiac findings: See attached radiology report for
non-cardiac structures.
IMPRESSION: 1. Tricuspid aortic valve with diffuse severe calcifications. Severe
aortic stenosis (calcium score [7X]).

2. Annular measurements appropriate for 29 mm S3 (553 mm2).

3. Mild single calcification under the LCC.

4. Sufficient coronary to annulus distance.

5. Optimal Fluoroscopic Angle for Delivery: LAO 8 ADEOLA 9

6. Mildly reduced left ventricular function, LVEF=41%. Global
hypokinesis.

7. Dilated pulmonary artery suggestive of pulmonary hypertension.

*** End of Addendum ***
EXAM:
OVER-READ INTERPRETATION  CT CHEST

The following report is an over-read performed by radiologist Dr.
ADEOLA [REDACTED] on [DATE]. This
over-read does not include interpretation of cardiac or coronary
anatomy or pathology. The coronary calcium score/coronary CTA
interpretation by the cardiologist is attached.
FINDINGS: Extracardiac findings will be described separately under dictation
for contemporaneously obtained CTA chest, abdomen and pelvis.
IMPRESSION: Please see separate dictation for contemporaneously obtained CTA
chest, abdomen and pelvis [DATE] for full description of
relevant extracardiac findings.

## 2021-12-05 MED ORDER — IOHEXOL 350 MG/ML SOLN
95.0000 mL | Freq: Once | INTRAVENOUS | Status: AC | PRN
  Administered 2021-12-05: 95 mL via INTRAVENOUS

## 2021-12-06 ENCOUNTER — Telehealth: Payer: Self-pay

## 2021-12-06 MED ORDER — FUROSEMIDE 20 MG PO TABS: 20.0000 mg | ORAL_TABLET | Freq: Every day | ORAL | 6 refills | Status: DC

## 2021-12-06 NOTE — Telephone Encounter (Signed)
Nicholas Mocha, MD  Barkley Boards, RN Signs of CHF noted on CTA chest. Would add furosemide 20 mg daily and advise patient to limit salt.     I contacted the pt's sister in law, Nawaf Strange ([phone]), since she is helping the pt with his TAVR evaluation, due to his wife being in a rehab facility. I made her aware of CT findings and recommendation to start Furosemide 20mg  daily in addition to his current medications. Rx sent to local pharmacy.  I also advised her that the pt needs to limit his salt intake.  Things to avoid would be frozen and canned foods and meals prepared at restaurants. Bethena Roys verbalized understanding and will contact the pt with recommendations.

## 2021-12-09 ENCOUNTER — Other Ambulatory Visit: Payer: Self-pay

## 2021-12-09 DIAGNOSIS — I5022 Chronic systolic (congestive) heart failure: Secondary | ICD-10-CM

## 2021-12-09 MED ORDER — ENTRESTO 97-103 MG PO TABS: 1.0000 | ORAL_TABLET | Freq: Two times a day (BID) | ORAL | 3 refills | Status: DC

## 2021-12-16 ENCOUNTER — Encounter: Payer: Medicare Other | Admitting: Surgery

## 2021-12-17 ENCOUNTER — Other Ambulatory Visit: Payer: Self-pay | Admitting: Physician Assistant

## 2021-12-17 ENCOUNTER — Encounter: Payer: Self-pay | Admitting: Physician Assistant

## 2021-12-17 DIAGNOSIS — I350 Nonrheumatic aortic (valve) stenosis: Secondary | ICD-10-CM

## 2021-12-18 ENCOUNTER — Other Ambulatory Visit: Payer: Self-pay

## 2021-12-18 ENCOUNTER — Encounter: Payer: Self-pay | Admitting: Surgery

## 2021-12-18 ENCOUNTER — Institutional Professional Consult (permissible substitution): Payer: Medicare Other | Admitting: Surgery

## 2021-12-18 VITALS — BP 189/79 | HR 68 | Resp 20 | Ht 71.0 in | Wt 195.0 lb

## 2021-12-18 DIAGNOSIS — I350 Nonrheumatic aortic (valve) stenosis: Secondary | ICD-10-CM | POA: Diagnosis not present

## 2021-12-18 NOTE — Progress Notes (Addendum)
[facility]   MULTIDISCIPLINARY HEART VALVE CLINIC         [address]       ,[place] [address]             [phone]          CARDIOTHORACIC SURGERY CONSULTATION REPORT  PCP is Nicholas Sacramento, MD Referring Provider is Nicholas Mocha, MD Primary Cardiologist is Nicholas Kras, DO  Reason for consultation:  Severe aortic stenosis  HPI:  The patient is an 81 year old gentleman with history of hypertension, hyperlipidemia, type 2 diabetes, previous smoking, and aortic stenosis who was referred by Dr. Terri Shelton to the structural heart valve clinic for consideration of aortic valve replacement.  Previous echocardiogram in January 2019 showed mild aortic stenosis with a mean gradient of 17 mmHg.  Serial echocardiograms since then has shown increasing mean gradient which was 27 mmHg in August 2022.  His most recent echocardiogram on 10/21/2021 showed a mean gradient of 30 mmHg with a valve area of 0.99 cm.  Dimensionless index was 0.25.  Left ventricular ejection fraction was 40% with global hypokinesis.  There is mild LV dilation with diastolic diameter of 5.3 cm end-systolic diameter of 4.8 cm.  An echocardiogram in January 2022 showed an ejection fraction of 55 to 60%.  His ejection fraction was 50 to 55% on echo in August 2022.  The patient states that he does exercise for 1 to 2 hours/day without any exertional symptoms.  He denies any chest pain or pressure.  He has had no shortness of breath or fatigue.  He is here today with his sister-in-law, Nicholas Shelton, because his wife is in poor health.  He provides care for her at home.  Past Medical History:  Diagnosis Date   Abnormal blood chemistry test    Actinic keratosis    Arthritis    AV block    Blood transfusion without reported diagnosis    Bradycardia    Degeneration of lumbar or lumbosacral intervertebral disc    Deviated nasal septum    Diabetes mellitus without complication (HCC)     Gastroesophageal reflux disease without esophagitis    Generalized anxiety disorder    Gout with tophus    Headache    Hearing loss    Hyperlipidemia    Hypertension    Insomnia    Ischemia    Murmur    Nasal turbinate hypertrophy    Osteoarthritis of wrist    PAC (premature atrial contraction)    Peptic ulcer    Peripheral neuropathy    PVC (premature ventricular contraction)    Seasonal allergic rhinitis due to pollen     Past Surgical History:  Procedure Laterality Date   COLONOSCOPY     ELBOW SURGERY     pinched nerve on both arms   INGUINAL HERNIA REPAIR Right 10/30/2018   Procedure: OPEN REPAIR OF INCARCERATED RIGHT INGUINAL HERNIA WITH MESH;  Surgeon: Greer Pickerel, MD;  Location: [facility];  Service: General;  Laterality: Right;   LEFT HEART CATH AND CORONARY ANGIOGRAPHY N/A 01/15/2021   Procedure: LEFT HEART CATH AND CORONARY ANGIOGRAPHY;  Surgeon: Adrian Prows, MD;  Location: [facility];  Service: Cardiovascular;  Laterality: N/A;   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   Procedure: TOTAL KNEE ARTHROPLASTY;  Surgeon: Sydnee Cabal, MD;  Location: [facility];  Service: Orthopedics;  Laterality: Right;  with adductor canal   WRIST SURGERY     pinched nerve  Family History  Problem Relation Age of Onset   Heart attack Mother     Social History   Socioeconomic History   Marital status: Married    Spouse name: Not on file   Number of children: 3   Years of education: Not on file   Highest education level: Not on file  Occupational History   Not on file  Tobacco Use   Smoking status: Former    Packs/day: 0.50    Years: 15.00    Pack years: 7.50    Types: Cigarettes    Quit date: 1996    Years since quitting: 27.1   Smokeless tobacco: Never  Vaping Use   Vaping Use: Never used  Substance and Sexual Activity   Alcohol use: No   Drug use: No   Sexual activity: Not on file    Comment: married  Other Topics Concern   Not on file  Social History  Narrative   Not on file   Social Determinants of Health   Financial Resource Strain: Not on file  Food Insecurity: Not on file  Transportation Needs: Not on file  Physical Activity: Not on file  Stress: Not on file  Social Connections: Not on file  Intimate Partner Violence: Not on file    Prior to Admission medications   Medication Sig Start Date End Date Taking? Authorizing Provider  acetaminophen (TYLENOL) 500 MG tablet Take 500 mg by mouth every 8 (eight) hours as needed for moderate pain.   Yes [provider]  allopurinol (ZYLOPRIM) 300 MG tablet Take 300 mg by mouth daily.   Yes [provider]  atorvastatin (LIPITOR) 20 MG tablet Take 1 tablet (20 mg total) by mouth 2 (two) times a week. Patient taking differently: Take 20 mg by mouth See admin instructions. Take one tablet (20 mg) by mouth twice weekly - Monday and Thursday 11/09/17  Yes Deboraha Sprang, MD  carvedilol (COREG) 3.125 MG tablet Take 1 tablet (3.125 mg total) by mouth 2 (two) times daily with a meal. 11/06/17  Yes Deboraha Sprang, MD  cloNIDine (CATAPRES) 0.3 MG tablet Take 0.6 mg by mouth 2 (two) times daily.    Yes [provider]  furosemide (LASIX) 20 MG tablet Take 1 tablet (20 mg total) by mouth daily. 12/06/21 01/05/22 Yes Nicholas Mocha, MD  hydrochlorothiazide (HYDRODIURIL) 25 MG tablet Take 25 mg by mouth daily.   Yes [provider]  metFORMIN (GLUCOPHAGE) 1000 MG tablet Take 1 tablet (1,000 mg total) by mouth 2 (two) times daily with a meal. 01/17/21  Yes Adrian Prows, MD  pantoprazole (PROTONIX) 40 MG tablet Take 40 mg by mouth daily.   Yes [provider]  sacubitril-valsartan (ENTRESTO) 49-51 MG Take 2 tablets by mouth 2 (two) times daily.   Yes [provider]  aspirin EC 81 MG tablet Take 1 tablet (81 mg total) by mouth daily. Swallow whole. Patient not taking: Reported on 12/17/2021 01/03/21   Tolia, Sunit, DO  sacubitril-valsartan (ENTRESTO) 97-103  MG Take 1 tablet by mouth 2 (two) times daily. Patient not taking: Reported on 12/18/2021 12/09/21 03/09/22  Nicholas Kras, DO    Current Outpatient Medications  Medication Sig Dispense Refill   acetaminophen (TYLENOL) 500 MG tablet Take 500 mg by mouth every 8 (eight) hours as needed for moderate pain.     allopurinol (ZYLOPRIM) 300 MG tablet Take 300 mg by mouth daily.     atorvastatin (LIPITOR) 20 MG tablet Take 1 tablet (20  mg total) by mouth 2 (two) times a week. (Patient taking differently: Take 20 mg by mouth See admin instructions. Take one tablet (20 mg) by mouth twice weekly - Monday and Thursday) 10 tablet 10   carvedilol (COREG) 3.125 MG tablet Take 1 tablet (3.125 mg total) by mouth 2 (two) times daily with a meal. 60 tablet 11   cloNIDine (CATAPRES) 0.3 MG tablet Take 0.6 mg by mouth 2 (two) times daily.      furosemide (LASIX) 20 MG tablet Take 1 tablet (20 mg total) by mouth daily. 30 tablet 6   hydrochlorothiazide (HYDRODIURIL) 25 MG tablet Take 25 mg by mouth daily.     metFORMIN (GLUCOPHAGE) 1000 MG tablet Take 1 tablet (1,000 mg total) by mouth 2 (two) times daily with a meal.     pantoprazole (PROTONIX) 40 MG tablet Take 40 mg by mouth daily.     sacubitril-valsartan (ENTRESTO) 49-51 MG Take 2 tablets by mouth 2 (two) times daily.     aspirin EC 81 MG tablet Take 1 tablet (81 mg total) by mouth daily. Swallow whole. (Patient not taking: Reported on 12/17/2021) 90 tablet 3   sacubitril-valsartan (ENTRESTO) 97-103 MG Take 1 tablet by mouth 2 (two) times daily. (Patient not taking: Reported on 12/18/2021) 180 tablet 3   No current facility-administered medications for this visit.    No Known Allergies    Review of Systems:   General:  normal appetite, normal energy, no weight gain, no weight loss, no fever  Cardiac:  no chest pain with exertion, no chest pain at rest, no SOB with exertion, no resting SOB, no PND, no orthopnea, no palpitations, no arrhythmia, no atrial  fibrillation, no LE edema, no dizzy spells, no syncope  Respiratory:  no shortness of breath, no home oxygen, no productive cough, no dry cough, no bronchitis, no wheezing, no hemoptysis, no asthma, no pain with inspiration or cough, no sleep apnea, no CPAP at night  GI:   no difficulty swallowing, no reflux, no frequent heartburn, no hiatal hernia, no abdominal pain, no constipation, no diarrhea, no hematochezia, no hematemesis, no melena  GU:   no dysuria,  no frequency, no urinary tract infection, no hematuria, no enlarged prostate, no kidney stones, no kidney disease  Vascular:  no pain suggestive of claudication, no pain in feet, no leg cramps, no varicose veins, no DVT, no non-healing foot ulcer  Neuro:   no stroke, no TIA's, no seizures, no headaches, no temporary blindness one eye,  no slurred speech, no peripheral neuropathy, no chronic pain, no instability of gait, no memory/cognitive dysfunction  Musculoskeletal: + arthritis, no joint swelling, no myalgias, no difficulty walking, normal mobility   Skin:   no rash, no itching, no skin infections, no pressure sores or ulcerations  Psych:   no anxiety, no depression, no nervousness, no unusual recent stress  Eyes:   no blurry vision, no floaters, no recent vision changes, + wears glasses   ENT:   + hearing loss, no loose or painful teeth, + dentures  Hematologic:  + easy bruising, no abnormal bleeding, no clotting disorder, no frequent epistaxis  Endocrine:  + diabetes, does check CBG's at home     Physical Exam:   BP (!) 189/79    Pulse 68    Resp 20    Ht 5\' 11"  (1.803 m)    Wt 195 lb (88.5 kg)    SpO2 97% Comment: RA   BMI 27.20 kg/m   General:  Elderly,  well-appearing  HEENT:  Unremarkable, NCAT, PERLA, EOMI  Neck:   no JVD, no bruits, no adenopathy   Chest:   clear to auscultation, symmetrical breath sounds, no wheezes, no rhonchi   CV:   RRR, 3/6 systolic murmur RSB, no diastolic murmur  Abdomen:  soft, non-tender, no masses    Extremities:  warm, well-perfused, pulses not palpable at ankle, no lower extremity edema  Rectal/GU  Deferred  Neuro:   Grossly non-focal and symmetrical throughout  Skin:   Clean and dry, no rashes, no breakdown  Diagnostic Tests:  ECHOCARDIOGRAM REPORT         Patient Name:   Nicholas Shelton Date of Exam: 10/21/2021  Medical Rec #:  [id number]  Height:       70.0 in  Accession #:    [id number] Weight:       198.0 lb  Date of Birth:  April 16, 1941  BSA:          2.078 m  Patient Age:    59 years   BP:           193/82 mmHg  Patient Gender: M          HR:           74 bpm.  Exam Location:  Outpatient   Procedure: 2D Echo, 3D Echo, Color Doppler, Strain Analysis and Cardiac  Doppler   Indications:    I35.0 Aortic stenosis     History:        Patient has prior history of Echocardiogram examinations,  most                  recent 06/18/2021. LHC revelaed very mild irregular CAD,  normal                  EF, Signs/Symptoms:SEM @[facility] ; Risk Factors:Hypertension,                  Dyslipidemia, Diabetes and Former Smoker. Previous echo  revealed                  LVEF 55%, moderate AS with mean gradient of 25 mmHg and  PAP 49                  mmhg.                  Previous nuc med done 12/24/20, revealed moderate sized  moderate                  reduction in counts in the basal inferior and  inferolateral wall                  suggestive of ischemia. Hypokinesis in mid inferolateral,  mid                  inferior wall and basal inferolateral walls EF 48%.     Sonographer:    Lenard Galloway [facility], RDCS  Referring Phys: [id number] [place]     1. Compared to echo report from September 2022, LVEF is depressed and AS  is severe.   2. Global longitudinal strain is -13.4%. Left ventricular ejection  fraction, by estimation, is 40%%. The left ventricle has moderately  decreased function. The left ventricle demonstrates global hypokinesis.  The left ventricular internal  cavity size was  mildly dilated. There is mild left ventricular hypertrophy.   3. Right ventricular systolic function is mildly reduced. The right  ventricular size is normal.   4. Left atrial size was moderately dilated.   5. Right atrial size was mildly dilated.   6. Mild mitral valve regurgitation.   7. AV is thickened, calcified with restricted motion motion. Peak and  mean gradients through the valve are 48 and 30 mm Hg respectively. AVA  (VTI) is 0.99cm2. Dimensionless index is 0.25 All consistent with severe  AS. Marland Kitchen Aortic valve regurgitation is not   visualized.   8. The inferior vena cava is dilated in size with <50% respiratory  variability, suggesting right atrial pressure of 15 mmHg.   FINDINGS   Left Ventricle: Global longitudinal strain is -13.4%. Left ventricular  ejection fraction, by estimation, is 40%%. The left ventricle has  moderately decreased function. The left ventricle demonstrates global  hypokinesis. The left ventricular internal  cavity size was mildly dilated. There is mild left ventricular  hypertrophy.   Right Ventricle: The right ventricular size is normal. Right vetricular  wall thickness was not assessed. Right ventricular systolic function is  mildly reduced.   Left Atrium: Left atrial size was moderately dilated.   Right Atrium: Right atrial size was mildly dilated.   Pericardium: Trivial pericardial effusion is present.   Mitral Valve: There is mild thickening of the mitral valve leaflet(s).  Mild mitral valve regurgitation.   Tricuspid Valve: The tricuspid valve is normal in structure. Tricuspid  valve regurgitation is mild.   Aortic Valve: AV is thickened, calcified with restricted motion motion.  Peak and mean gradients through the valve are 48 and 30 mm Hg  respectively. AVA (VTI) is 0.99cm2. Dimensionless index is 0.25 All  consistent with severe AS. Aortic valve  regurgitation is not visualized. Aortic valve mean gradient measures  30.0  mmHg. Aortic valve peak gradient measures 48.2 mmHg. Aortic valve area, by  VTI measures 0.94 cm.   Pulmonic Valve: The pulmonic valve was normal in structure. Pulmonic valve  regurgitation is trivial.   Aorta: The aortic root and ascending aorta are structurally normal, with  no evidence of dilitation.   Venous: The inferior vena cava is dilated in size with less than 50%  respiratory variability, suggesting right atrial pressure of 15 mmHg.   IAS/Shunts: No atrial level shunt detected by color flow Doppler.      LEFT VENTRICLE  PLAX 2D  LVIDd:         5.30 cm   Diastology  LVIDs:         4.80 cm   LV e' medial:    5.70 cm/s  LV PW:         1.30 cm   LV E/e' medial:  25.1  LV IVS:        0.90 cm   LV e' lateral:   5.93 cm/s  LVOT diam:     2.20 cm   LV E/e' lateral: 24.1  LV SV:         84  LV SV Index:   40        2D Longitudinal Strain  LVOT Area:     3.80 cm  2D Strain GLS (A2C):   -15.0 %                           2D Strain GLS (A3C):   -11.9 %  2D Strain GLS (A4C):   -13.4 %                           2D Strain GLS Avg:     -13.4 %                              3D Volume EF:                           3D EF:        41 %                           LV EDV:       216 ml                           LV ESV:       127 ml                           LV SV:        90 ml   RIGHT VENTRICLE             IVC  RV Basal diam:  5.00 cm     IVC diam: 2.00 cm  RV Mid diam:    4.20 cm  RV S prime:     14.60 cm/s  TAPSE (M-mode): 2.6 cm  RVSP:           54.5 mmHg   LEFT ATRIUM              Index        RIGHT ATRIUM           Index  LA diam:        4.50 cm  2.17 cm/m   RA Pressure: 8.00 mmHg  LA Vol (A2C):   99.2 ml  47.73 ml/m  RA Area:     21.70 cm  LA Vol (A4C):   93.0 ml  44.75 ml/m  RA Volume:   72.70 ml  34.98 ml/m  LA Biplane Vol: 100.0 ml 48.11 ml/m   AORTIC VALVE  AV Area (Vmax):    1.07 cm  AV Area (Vmean):   1.03 cm  AV Area (VTI):      0.94 cm  AV Vmax:           347.00 cm/s  AV Vmean:          241.000 cm/s  AV VTI:            0.896 m  AV Peak Grad:      48.2 mmHg  AV Mean Grad:      30.0 mmHg  LVOT Vmax:         98.00 cm/s  LVOT Vmean:        65.500 cm/s  LVOT VTI:          0.221 m  LVOT/AV VTI ratio: 0.25     AORTA  Ao Root diam: 3.50 cm  Ao Asc diam:  3.10 cm   MITRAL VALVE                TRICUSPID VALVE  MV Area (PHT): 4.63 cm     TR Peak grad:   46.5 mmHg  MV Decel Time: 164 msec     TR Vmax:        341.00 cm/s  MV E velocity: 143.00 cm/s  Estimated RAP:  8.00 mmHg  MV A velocity: 50.80 cm/s   RVSP:           54.5 mmHg  MV E/A ratio:  2.81                              SHUNTS                              Systemic VTI:  0.22 m                              Systemic Diam: 2.20 cm   Dorris Carnes MD  Electronically signed by Dorris Carnes MD  Signature Date/Time: 10/21/2021/5:00:51 PM         Final    Physicians  Panel Physicians Referring Physician Case Authorizing Physician  Adrian Prows, MD (Primary)     Procedures  LEFT HEART CATH AND CORONARY ANGIOGRAPHY   Conclusion  Left heart catheterization 01/15/2021: LV: Normal LV systolic function.  EDP mildly elevated at 20 mmHg.  There is 22 mmHg peak to peak pressure gradient across the aortic valve.  Findings are consistent with mild aortic stenosis. Very mild coronary calcification and minimal luminal irregularity in the coronary vessels.  Right dominant circulation.   Impression: No significant coronary disease angiography.  Coronary calcification is evident.  Normal LVEF and mild aortic stenosis. 50 mL contrast used. Recommendations  Antiplatelet/Anticoag No indication for antiplatelet therapy at this time .   Indications  Abnormal nuclear stress test [R94.39 (ICD-10-CM)]  Nonrheumatic aortic valve stenosis [I35.0 (ICD-10-CM)]   Procedural Details  Technical Details Procedure performed:  Left heart catheterization including hemodynamic  monitoring of the left ventricle, LV gram, selective right and left coronary arteriography.  Indication:  Sophie Quiles  is a 81 y.o. male  with history of hypertension,  hyperlipidemia,  Diabetes Mellitus   who presents for preoperative cardiovascular stratification, scheduled for elective knee replacement.  Had an abnormal nuclear stress test.  In view of multiple cardiovascular factors, moderate aortic stenosis, felt it was best to proceed with coronary angiography to exclude major proximal coronary artery disease.    Technique: Under sterile precautions using a 6 French right radial  arterial access, a 6 French sheath was introduced into the right radial artery. A 5 [place] Tig 4 catheter was advanced into the ascending aorta selective  right coronary artery and left coronary artery was cannulated and angiography was performed in multiple views. The catheter was pulled back Out of the body over exchange length J-wire.  Same  Catheter was used to perform LV gram which was performed in RAO projection.  Catheter exchanged out of the body over J-Wire. NO immediate complications noted. Patient tolerated the procedure well. Contrast used: 50 mL. Estimated blood loss <50 mL.   During this procedure medications were administered to achieve and maintain moderate conscious sedation while the patient's heart rate, blood pressure, and oxygen saturation were continuously monitored and I was present face-to-face 100% of this time.   Medications (Filter: Administrations occurring from 1045 to 1142 on 01/15/21)  important  Continuous medications are totaled by the amount administered until 01/15/21 1142.   Heparin (Porcine) in NaCl  1000-0.9 UT/500ML-% SOLN (mL) Total volume:  1,000 mL Date/Time Rate/Dose/Volume Action   01/15/21 1052 500 mL Given   1052 500 mL Given    fentaNYL (SUBLIMAZE) injection (mcg) Total dose:  25 mcg Date/Time Rate/Dose/Volume Action   01/15/21 1104 25 mcg Given    midazolam  (VERSED) injection (mg) Total dose:  2 mg Date/Time Rate/Dose/Volume Action   01/15/21 1104 2 mg Given    lidocaine (PF) (XYLOCAINE) 1 % injection (mL) Total volume:  2 mL Date/Time Rate/Dose/Volume Action   01/15/21 1108 2 mL Given    Radial Cocktail/Verapamil only (mL) Total volume:  5 mL Date/Time Rate/Dose/Volume Action   01/15/21 1115 5 mL Given   Canceled Entry    heparin sodium (porcine) injection (Units) Total dose:  5,000 Units Date/Time Rate/Dose/Volume Action   01/15/21 1120 5,000 Units Given    iohexol (OMNIPAQUE) 350 MG/ML injection (mL) Total volume:  50 mL Date/Time Rate/Dose/Volume Action   01/15/21 1128 50 mL Given    Sedation Time  Sedation Time Physician-1: 21 minutes 48 seconds Contrast  Medication Name Total Dose  iohexol (OMNIPAQUE) 350 MG/ML injection 50 mL   Radiation/Fluoro  Fluoro time: 3.6 (min) DAP: 10273 (mGycm2) Cumulative Air Kerma: 178 (mGy) Coronary Findings  Diagnostic Dominance: Right Left Anterior Descending  There is mild diffuse disease throughout the vessel.    Left Circumflex  The vessel exhibits minimal luminal irregularities.    Right Coronary Artery  The vessel exhibits minimal luminal irregularities.    Intervention   No interventions have been documented.   Left Heart  Left Ventricle The left ventricular size is normal. The left ventricular systolic function is normal. LV end diastolic pressure is mildly elevated. The left ventricular ejection fraction is 55-65% by visual estimate. No regional wall motion abnormalities. There is no evidence of mitral regurgitation.   Coronary Diagrams  Diagnostic Dominance: Right Intervention  Implants     No implant documentation for this case.   Syngo Images   Show images for CARDIAC CATHETERIZATION Images on Long Term Storage   Show images for [place], Repass to Procedure Log  Procedure Log    Hemo Data  Flowsheet Row Most Recent Value  AO Systolic  Pressure 034 mmHg  AO Diastolic Pressure 52 mmHg  AO Mean 87 mmHg  LV Systolic Pressure 742 mmHg  LV Diastolic Pressure 2 mmHg  LV EDP 9 mmHg  AOp Systolic Pressure 595 mmHg  AOp Diastolic Pressure 42 mmHg  AOp Mean Pressure 67 mmHg  LVp Systolic Pressure 638 mmHg  LVp Diastolic Pressure 7 mmHg  LVp EDP Pressure 20 mmHg    ADDENDUM REPORT: 12/05/2021 12:31   CLINICAL DATA:  Severe Aortic Stenosis.   EXAM: Cardiac TAVR CT   TECHNIQUE: A non-contrast, gated CT scan was obtained with axial slices of 3 mm through the heart for aortic valve calcium scoring. A 110 kV retrospective, gated, contrast cardiac scan was obtained. Gantry rotation speed was 250 msecs and collimation was 0.6 mm. Nitroglycerin was not given. The 3D data set was reconstructed in 5% intervals of the 0-95% of the R-R cycle. Systolic and diastolic phases were analyzed on a dedicated workstation using MPR, MIP, and VRT modes. The patient received 100 cc of contrast.   FINDINGS: Image quality: Excellent.   Noise artifact is: Limited.   Valve Morphology: Tricuspid aortic valve. Severely calcified with diffuse calcium. Severely restricted leaflet motion in systole.   Aortic Valve Calcium score: 2847   Aortic annular dimension:  Phase assessed: 15%   Annular area: 553 mm2   Annular perimeter: 85.2 mm   Max diameter: 30.1 mm   Min diameter: 24.2 mm   Annular and subannular calcification: Mild single calcification under the [place].   Membranous septum length: 11.8 mm   Optimal coplanar projection: LAO 8 CAU 9   Coronary Artery Height above Annulus:   Left Main: 16.4 mm   Right Coronary: 25.5 mm   Sinus of Valsalva Measurements:   Non-coronary: 36 mm   Right-coronary: 36 mm   Left-coronary: 36 mm   Sinus of Valsalva Height:   Non-coronary: 29.1 mm   Right-coronary: 28.8 mm   Left-coronary: 25.9 mm   Sinotubular Junction: 31 mm.  Circumferential calcifications noted.   Ascending  Thoracic Aorta: 38 mm   Coronary Arteries: Normal coronary origin. Right dominance. The study was performed without use of NTG and is insufficient for plaque evaluation. Please refer to recent cardiac catheterization for coronary assessment. 3-vessel coronary calcifications noted.   Cardiac Morphology:   Right Atrium: Right atrial size is dilated.   Right Ventricle: The right ventricular cavity is dilated.   Left Atrium: Left atrial size is dilated.  Small, hypoplastic LAA.   Left Ventricle: The ventricular cavity size is dilated. There are no stigmata of prior infarction. There is no abnormal filling defect. Mildly reduced left ventricular function, LVEF=41%. Global hypokinesis.   Pulmonary arteries: Dilated, suggest of pulmonary hypertension. No proximal filling defect.   Pulmonary veins: Normal pulmonary venous drainage.   Pericardium: Normal thickness. Small circumferential pericardial effusion.   Mitral Valve: The mitral valve is normal structure without significant calcification.   Extra-cardiac findings: See attached radiology report for non-cardiac structures.   IMPRESSION: 1. Tricuspid aortic valve with diffuse severe calcifications. Severe aortic stenosis (calcium score 2847).   2. Annular measurements appropriate for 29 mm S3 (553 mm2).   3. Mild single calcification under the [place]mann.   4. Sufficient coronary to annulus distance.   5. Optimal Fluoroscopic Angle for Delivery: LAO 8 CAU 9   6. Mildly reduced left ventricular function, LVEF=41%. Global hypokinesis.   7. Dilated pulmonary artery suggestive of pulmonary hypertension.   [place]ells T. Audie Box, MD     Electronically Signed   By: Eleonore Chiquito M.D.   On: 12/05/2021 12:31    Narrative & Impression  CLINICAL DATA:  81 year old male with history of severe aortic stenosis. Preprocedural study prior to potential transcatheter aortic valve replacement.   EXAM: CT ANGIOGRAPHY CHEST, ABDOMEN AND  PELVIS   TECHNIQUE: Non-contrast CT of the chest was initially obtained.   Multidetector CT imaging through the chest, abdomen and pelvis was performed using the standard protocol during bolus administration of intravenous contrast. Multiplanar reconstructed images and MIPs were obtained and reviewed to evaluate the vascular anatomy.   RADIATION DOSE REDUCTION: This exam was performed according to the departmental dose-optimization program which includes automated exposure control, adjustment of the mA and/or kV according to patient size and/or use of iterative reconstruction technique.   CONTRAST:  25mL OMNIPAQUE IOHEXOL 350 MG/ML SOLN   COMPARISON:  CTA of the chest, abdomen and pelvis 10/24/2018. CT of the abdomen and pelvis 10/30/2018.   FINDINGS: CTA CHEST FINDINGS   Cardiovascular: Heart size is mildly enlarged. Small amount of pericardial fluid and/or thickening, unlikely to be of hemodynamic significance at this time. No pericardial calcification. There is aortic atherosclerosis, as well as atherosclerosis of the great vessels of the mediastinum and the coronary arteries, including calcified atherosclerotic plaque  in the left main, left anterior descending, left circumflex and right coronary arteries. Severe thickening and calcification of the aortic valve.   Mediastinum/Lymph Nodes: Multiple prominent borderline enlarged mediastinal and hilar lymph nodes are noted, but nonspecific. No definite pathologic lymphadenopathy identified in mediastinal or hilar nodal stations. Esophagus is unremarkable in appearance. No axillary lymphadenopathy.   Lungs/Pleura: Moderate right and small left pleural effusions lying dependently. Areas of passive subsegmental atelectasis are noted in the lower lobes of the lungs bilaterally. Mild patchy areas of ground-glass attenuation and interlobular septal thickening are noted throughout the lungs bilaterally, suggesting a background  of mild interstitial pulmonary edema. No acute consolidative airspace disease. No suspicious appearing pulmonary nodules or masses are noted.   Musculoskeletal/Soft Tissues: There are no aggressive appearing lytic or blastic lesions noted in the visualized portions of the skeleton.   CTA ABDOMEN AND PELVIS FINDINGS   Hepatobiliary: Liver has a slightly shrunken appearance and nodular contour, suggesting underlying cirrhosis. No suspicious cystic or solid hepatic lesions. No intra or extrahepatic biliary ductal dilatation. Gallbladder is normal in appearance.   Pancreas: No pancreatic mass. No pancreatic ductal dilatation. No pancreatic or peripancreatic fluid collections or inflammatory changes.   Spleen: Unremarkable.   Adrenals/Urinary Tract: Multiple low-attenuation lesions in both kidneys, compatible with simple cysts, largest of which is exophytic extending from the posterior aspect of the upper pole of the left kidney measuring 8.1 x 5.5 cm. Several other subcentimeter low-attenuation lesions in both kidneys are too small to definitively characterize, but are favored to represent tiny cysts. No hydroureteronephrosis. Urinary bladder is normal in appearance. Bilateral adrenal glands are normal in appearance.   Stomach/Bowel: The appearance of the stomach is normal. There is no pathologic dilatation of small bowel or colon. Normal appendix.   Vascular/Lymphatic: Aortic atherosclerosis, without evidence of aneurysm or dissection in the abdominal or pelvic vasculature. Vascular findings and measurements pertinent to potential TAVR procedure, as detailed below. No lymphadenopathy noted in the abdomen or pelvis.   Reproductive: Prostate gland and seminal vesicles are unremarkable in appearance.   Other: Small volume of ascites.  No pneumoperitoneum.   Musculoskeletal: There are no aggressive appearing lytic or blastic lesions noted in the visualized portions of the  skeleton.   VASCULAR MEASUREMENTS PERTINENT TO TAVR:   AORTA:   Minimal Aortic Diameter-14 x 12 mm   Severity of Aortic Calcification-severe   RIGHT PELVIS:   Right Common Iliac Artery -   Minimal Diameter-8.7 x 5.8 mm   Tortuosity-mild   Calcification-severe   Right External Iliac Artery -   Minimal Diameter-7.0 x 4.2 mm   Tortuosity-moderate   Calcification-severe   Right Common Femoral Artery -   Minimal Diameter-5.1 x 4.1 mm   Tortuosity-mild   Calcification-severe   LEFT PELVIS:   Left Common Iliac Artery -   Minimal Diameter-6.8 x 2.1 mm   Tortuosity-mild   Calcification-severe   Left External Iliac Artery -   Minimal Diameter-7.6 x 4.7 mm   Tortuosity-moderate   Calcification-moderate   Left Common Femoral Artery -   Minimal Diameter-4.3 x 2.7 mm   Tortuosity-mild   Calcification-severe   Review of the MIP images confirms the above findings.   IMPRESSION: 1. Vascular findings and measurements pertinent to potential TAVR procedure, as detailed above. 2. Severe thickening and calcification of the aortic valve, compatible with reported clinical history of severe aortic stenosis. 3. Mild cardiomegaly with moderate right and small left pleural effusions, as well as evidence of interstitial pulmonary edema in  the lungs; imaging findings concerning for congestive heart failure. 4. Small amount of pericardial fluid and/or thickening. No pericardial calcification. 5. Aortic atherosclerosis, in addition to left main and three-vessel coronary artery disease. 6. 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 7. Small volume of ascites. 8. Additional incidental findings, as above.     Electronically Signed   By: Vinnie Langton M.D.   On: 12/05/2021 12:17      Impression:  This 81 year old gentleman has stage D2, severe aortic stenosis with minimal symptoms (NYHA class I) but has had a progressive decline in left  ventricular systolic function with his ejection fraction decreasing from 55 to 60% in January 2022 to 40% on his most recent echocardiogram.  In addition his Pro-BNP is markedly elevated at 1918 consistent with congestive heart failure.  I have personally reviewed his 2D echocardiogram, cardiac catheterization, and CTA studies.  His echocardiogram shows a severely calcified and thickened aortic valve with restricted mobility.  The mean gradient is 30 mmHg with a dimensionless index of 0.25 and a valve area of 0.99 cm consistent with severe aortic stenosis.  Left ventricular ejection fraction is 40% with global hypokinesis and mild left ventricular dilation.  Cardiac catheterization shows no significant coronary disease.  I agree that aortic valve replacement is indicated in this patient to prevent progressive left ventricular deterioration.  Given his age I think transcatheter aortic valve replacement would be the best treatment for him.  His gated cardiac CTA shows anatomy suitable for TAVR using a SAPIEN 3 valve.  His abdominal and pelvic CTA shows heavily calcified femoral and iliac arteries bilaterally.  I do not think his femoral access is adequate and therefore we will plan on using his left subclavian artery which has minimal disease and is a large vessel.  The patient and his sister-in-law were counseled at length regarding treatment alternatives for management of severe symptomatic aortic stenosis. The risks and benefits of surgical intervention has been discussed in detail. Long-term prognosis with medical therapy was discussed. Alternative approaches such as conventional surgical aortic valve replacement, transcatheter aortic valve replacement, and palliative medical therapy were compared and contrasted at length. This discussion was placed in the context of the patient's own specific clinical presentation and past medical history. All of their questions have been addressed.   Following the decision  to proceed with transcatheter aortic valve replacement, a discussion was held regarding what types of management strategies would be attempted intraoperatively in the event of life-threatening complications, including whether or not the patient would be considered a candidate for the use of cardiopulmonary bypass and/or conversion to open sternotomy for attempted surgical intervention.  I do not think he is a candidate for emergent sternotomy to manage any intraoperative complications given his advanced age and diffuse calcification of the ascending aorta.  The patient is aware of the fact that transient use of cardiopulmonary bypass may be necessary. The patient has been advised of a variety of complications that might develop including but not limited to risks of death, stroke, paravalvular leak, aortic dissection or other major vascular complications, aortic annulus rupture, device embolization, cardiac rupture or perforation, mitral regurgitation, acute myocardial infarction, arrhythmia, heart block or bradycardia requiring permanent pacemaker placement, congestive heart failure, respiratory failure, renal failure, pneumonia, infection, other late complications related to structural valve deterioration or migration, or other complications that might ultimately cause a temporary or permanent loss of functional independence or other long term morbidity. The patient provides full informed consent for  the procedure as described and all questions were answered.     Plan:  Left subclavian artery TAVR using a SAPIEN 3 valve on 12/24/2021.  I spent 60 minutes performing this consultation and > 50% of this time was spent face to face counseling and coordinating the care of this patient's severe symptomatic aortic stenosis.   Gaye Pollack, MD 12/18/2021

## 2021-12-19 NOTE — Pre-Procedure Instructions (Signed)
Surgical Instructions    Your procedure is scheduled on Tuesday, December 24, 2021 at 7:30 AM.  Report to [facility] Main Entrance "A" at 5:30 A.M., then check in with the Admitting office.  Call this number if you have problems the morning of surgery:  [phone]   If you have any questions prior to your surgery date call [phone]: Open Monday-Friday 8am-4pm    Remember:  Do not eat or drink after midnight the night before your surgery   Continue taking all medications without change through the day before surgery, except hold Metformin on 12/23/21. On the morning of surgery do not take any medications.  As of today, STOP taking any Aleve, Naproxen, Ibuprofen, Motrin, Advil, Goody's, BC's, all herbal medications, fish oil, and all vitamins.  WHAT DO I DO ABOUT MY DIABETES MEDICATION?   Do not take metFORMIN (GLUCOPHAGE) the day before surgery or the morning of surgery.   HOW TO MANAGE YOUR DIABETES BEFORE AND AFTER SURGERY  Why is it important to control my blood sugar before and after surgery? Improving blood sugar levels before and after surgery helps healing and can limit problems. A way of improving blood sugar control is eating a healthy diet by:  Eating less sugar and carbohydrates  Increasing activity/exercise  Talking with your doctor about reaching your blood sugar goals High blood sugars (greater than 180 mg/dL) can raise your risk of infections and slow your recovery, so you will need to focus on controlling your diabetes during the weeks before surgery. Make sure that the doctor who takes care of your diabetes knows about your planned surgery including the date and location.  How do I manage my blood sugar before surgery? Check your blood sugar at least 4 times a day, starting 2 days before surgery, to make sure that the level is not too high or low.  Check your blood sugar the morning of your surgery when you wake up and every 2 hours until you get to the  Short Stay unit.  If your blood sugar is less than 70 mg/dL, you will need to treat for low blood sugar: Do not take insulin. Treat a low blood sugar (less than 70 mg/dL) with  cup of clear juice (cranberry or apple), 4 glucose tablets, OR glucose gel. Recheck blood sugar in 15 minutes after treatment (to make sure it is greater than 70 mg/dL). If your blood sugar is not greater than 70 mg/dL on recheck, call [phone] for further instructions. Report your blood sugar to the short stay nurse when you get to Short Stay.  If you are admitted to the hospital after surgery: Your blood sugar will be checked by the staff and you will probably be given insulin after surgery (instead of oral diabetes medicines) to make sure you have good blood sugar levels. The goal for blood sugar control after surgery is 80-180 mg/dL.                      Do NOT Smoke (Tobacco/Vaping) for 24 hours prior to your procedure.  If you use a CPAP at night, you may bring your mask/headgear for your overnight stay.   Contacts, glasses, piercing's, hearing aid's, dentures or partials may not be worn into surgery, please bring cases for these belongings.    For patients admitted to the hospital, discharge time will be determined by your treatment team.   Patients discharged the day of surgery will not be allowed to drive home,  and someone needs to stay with them for 24 hours.  NO VISITORS WILL BE ALLOWED IN PRE-OP WHERE PATIENTS ARE PREPPED FOR SURGERY.  ONLY 1 SUPPORT PERSON MAY BE PRESENT IN THE WAITING ROOM WHILE YOU ARE IN SURGERY.  IF YOU ARE TO BE ADMITTED, ONCE YOU ARE IN YOUR ROOM YOU WILL BE ALLOWED TWO (2) VISITORS. (1) VISITOR MAY STAY OVERNIGHT BUT MUST ARRIVE TO THE ROOM BY 8pm.  Minor children may have two parents present. Special consideration for safety and communication needs will be reviewed on a case by case basis.   Special instructions:   [facility]- Preparing For Surgery  Before surgery, you  can play an important role. Because skin is not sterile, your skin needs to be as free of germs as possible. You can reduce the number of germs on your skin by washing with CHG (chlorahexidine gluconate) Soap before surgery.  CHG is an antiseptic cleaner which kills germs and bonds with the skin to continue killing germs even after washing.    Oral Hygiene is also important to reduce your risk of infection.  Remember - BRUSH YOUR TEETH THE MORNING OF SURGERY WITH YOUR REGULAR TOOTHPASTE  Please do not use if you have an allergy to CHG or antibacterial soaps. If your skin becomes reddened/irritated stop using the CHG.  Do not shave (including legs and underarms) for at least 48 hours prior to first CHG shower. It is OK to shave your face.  Please follow these instructions carefully.   Shower the NIGHT BEFORE SURGERY and the MORNING OF SURGERY  If you chose to wash your hair, wash your hair first as usual with your normal shampoo.  After you shampoo, rinse your hair and body thoroughly to remove the shampoo.  Use CHG Soap as you would any other liquid soap. You can apply CHG directly to the skin and wash gently with a scrungie or a clean washcloth.   Apply the CHG Soap to your body ONLY FROM THE NECK DOWN.  Do not use on open wounds or open sores. Avoid contact with your eyes, ears, mouth and genitals (private parts). Wash Face and genitals (private parts)  with your normal soap.   Wash thoroughly, paying special attention to the area where your surgery will be performed.  Thoroughly rinse your body with warm water from the neck down.  DO NOT shower/wash with your normal soap after using and rinsing off the CHG Soap.  Pat yourself dry with a CLEAN TOWEL.  Wear CLEAN PAJAMAS to bed the night before surgery  Place CLEAN SHEETS on your bed the night before your surgery  DO NOT SLEEP WITH PETS.   Day of Surgery: Shower with CHG soap. Do not wear jewelry. Do not wear lotions, powders,  colognes, or deodorant. Do not shave 48 hours prior to surgery.  Men may shave face and neck. Do not bring valuables to the hospital. [facility] is not responsible for any belongings or valuables. Wear Clean/Comfortable clothing the morning of surgery Remember to brush your teeth WITH YOUR REGULAR TOOTHPASTE.   Please read over the following fact sheets that you were given.   3 days prior to your procedure or After your COVID test   You are not required to quarantine however you are required to wear a well-fitting mask when you are out and around people not in your household. If your mask becomes wet or soiled, replace with a new one.   Wash your hands often with  soap and water for 20 seconds or clean your hands with an alcohol-based hand sanitizer that contains at least 60% alcohol.   Do not share personal items.   Notify your provider:  o if you are in close contact with someone who has COVID  o or if you develop a fever of 100.4 or greater, sneezing, cough, sore throat, shortness of breath or body aches.

## 2021-12-20 ENCOUNTER — Other Ambulatory Visit: Payer: Self-pay | Admitting: Physician Assistant

## 2021-12-20 ENCOUNTER — Encounter (HOSPITAL_COMMUNITY): Payer: Self-pay

## 2021-12-20 ENCOUNTER — Ambulatory Visit (HOSPITAL_COMMUNITY)
Admission: RE | Admit: 2021-12-20 | Discharge: 2021-12-20 | Disposition: A | Discharge status: Home / Self Care | Payer: Medicare Other | Source: Ambulatory Visit | Attending: Physician Assistant | Admitting: Physician Assistant

## 2021-12-20 ENCOUNTER — Encounter: Payer: Medicare Other | Admitting: Surgery

## 2021-12-20 ENCOUNTER — Encounter (HOSPITAL_COMMUNITY)
Admission: RE | Admit: 2021-12-20 | Discharge: 2021-12-20 | Disposition: A | Discharge status: Home / Self Care | Payer: Medicare Other | Source: Ambulatory Visit | Attending: Cardiovascular Disease | Admitting: Cardiovascular Disease

## 2021-12-20 ENCOUNTER — Other Ambulatory Visit: Payer: Self-pay

## 2021-12-20 VITALS — BP 190/85 | HR 62 | Temp 98.0°F | Resp 18 | Ht 71.0 in | Wt 183.3 lb

## 2021-12-20 DIAGNOSIS — I350 Nonrheumatic aortic (valve) stenosis: Secondary | ICD-10-CM | POA: Insufficient documentation

## 2021-12-20 DIAGNOSIS — I2510 Atherosclerotic heart disease of native coronary artery without angina pectoris: Secondary | ICD-10-CM | POA: Diagnosis not present

## 2021-12-20 DIAGNOSIS — R0602 Shortness of breath: Secondary | ICD-10-CM | POA: Insufficient documentation

## 2021-12-20 DIAGNOSIS — Z79899 Other long term (current) drug therapy: Secondary | ICD-10-CM | POA: Diagnosis not present

## 2021-12-20 DIAGNOSIS — E785 Hyperlipidemia, unspecified: Secondary | ICD-10-CM | POA: Insufficient documentation

## 2021-12-20 DIAGNOSIS — Z87891 Personal history of nicotine dependence: Secondary | ICD-10-CM | POA: Insufficient documentation

## 2021-12-20 DIAGNOSIS — Z01818 Encounter for other preprocedural examination: Secondary | ICD-10-CM | POA: Insufficient documentation

## 2021-12-20 DIAGNOSIS — M1990 Unspecified osteoarthritis, unspecified site: Secondary | ICD-10-CM | POA: Insufficient documentation

## 2021-12-20 DIAGNOSIS — J90 Pleural effusion, not elsewhere classified: Secondary | ICD-10-CM | POA: Diagnosis not present

## 2021-12-20 DIAGNOSIS — R011 Cardiac murmur, unspecified: Secondary | ICD-10-CM | POA: Diagnosis not present

## 2021-12-20 DIAGNOSIS — Z96651 Presence of right artificial knee joint: Secondary | ICD-10-CM | POA: Diagnosis not present

## 2021-12-20 DIAGNOSIS — G629 Polyneuropathy, unspecified: Secondary | ICD-10-CM | POA: Insufficient documentation

## 2021-12-20 DIAGNOSIS — K219 Gastro-esophageal reflux disease without esophagitis: Secondary | ICD-10-CM | POA: Insufficient documentation

## 2021-12-20 DIAGNOSIS — H9190 Unspecified hearing loss, unspecified ear: Secondary | ICD-10-CM | POA: Diagnosis not present

## 2021-12-20 DIAGNOSIS — I10 Essential (primary) hypertension: Secondary | ICD-10-CM | POA: Diagnosis not present

## 2021-12-20 DIAGNOSIS — E119 Type 2 diabetes mellitus without complications: Secondary | ICD-10-CM | POA: Insufficient documentation

## 2021-12-20 DIAGNOSIS — I440 Atrioventricular block, first degree: Secondary | ICD-10-CM | POA: Diagnosis not present

## 2021-12-20 DIAGNOSIS — Z20822 Contact with and (suspected) exposure to covid-19: Secondary | ICD-10-CM | POA: Diagnosis not present

## 2021-12-20 DIAGNOSIS — I493 Ventricular premature depolarization: Secondary | ICD-10-CM | POA: Diagnosis not present

## 2021-12-20 DIAGNOSIS — I70208 Unspecified atherosclerosis of native arteries of extremities, other extremity: Secondary | ICD-10-CM | POA: Insufficient documentation

## 2021-12-20 DIAGNOSIS — Z01811 Encounter for preprocedural respiratory examination: Secondary | ICD-10-CM

## 2021-12-20 LAB — BLOOD GAS, ARTERIAL
Acid-Base Excess: 1.1 mmol/L (ref 0.0–2.0)
Bicarbonate: 24.8 mmol/L (ref 20.0–28.0)
Drawn by: 60286
FIO2: 21
O2 Saturation: 97.4 %
Patient temperature: 37
pCO2 arterial: 36.3 mmHg (ref 32.0–48.0)
pH, Arterial: 7.449 (ref 7.350–7.450)
pO2, Arterial: 95.4 mmHg (ref 83.0–108.0)

## 2021-12-20 LAB — COMPREHENSIVE METABOLIC PANEL
ALT: 16 U/L (ref 0–44)
AST: 21 U/L (ref 15–41)
Albumin: 3.2 g/dL — ABNORMAL LOW (ref 3.5–5.0)
Alkaline Phosphatase: 88 U/L (ref 38–126)
Anion gap: 10 (ref 5–15)
BUN: 24 mg/dL — ABNORMAL HIGH (ref 8–23)
CO2: 23 mmol/L (ref 22–32)
Calcium: 8.8 mg/dL — ABNORMAL LOW (ref 8.9–10.3)
Chloride: 108 mmol/L (ref 98–111)
Creatinine, Ser: 0.93 mg/dL (ref 0.61–1.24)
GFR, Estimated: >60 mL/min (ref >60)
Glucose, Bld: 142 mg/dL — ABNORMAL HIGH (ref 70–99)
Potassium: 3.7 mmol/L (ref 3.5–5.1)
Sodium: 141 mmol/L (ref 135–145)
Total Bilirubin: 0.3 mg/dL (ref 0.3–1.2)
Total Protein: 6.5 g/dL (ref 6.5–8.1)

## 2021-12-20 LAB — CBC
HCT: 36.1 % — ABNORMAL LOW (ref 39.0–52.0)
Hemoglobin: 11.2 g/dL — ABNORMAL LOW (ref 13.0–17.0)
MCH: 28.2 pg (ref 26.0–34.0)
MCHC: 31 g/dL (ref 30.0–36.0)
MCV: 90.9 fL (ref 80.0–100.0)
Platelets: 179 10*3/uL (ref 150–400)
RBC: 3.97 MIL/uL — ABNORMAL LOW (ref 4.22–5.81)
RDW: 14.4 % (ref 11.5–15.5)
WBC: 6 10*3/uL (ref 4.0–10.5)
nRBC: 0 % (ref 0.0–0.2)

## 2021-12-20 LAB — URINALYSIS, ROUTINE W REFLEX MICROSCOPIC
Bilirubin Urine: NEGATIVE
Glucose, UA: NEGATIVE mg/dL
Ketones, ur: NEGATIVE mg/dL
Leukocytes,Ua: NEGATIVE
Nitrite: NEGATIVE
Protein, ur: 100 mg/dL — AB
Specific Gravity, Urine: 1.009 (ref 1.005–1.030)
pH: 5 (ref 5.0–8.0)

## 2021-12-20 LAB — TYPE AND SCREEN
ABO/RH(D): O POS
Antibody Screen: NEGATIVE

## 2021-12-20 LAB — SURGICAL PCR SCREEN
MRSA, PCR: NEGATIVE
Staphylococcus aureus: POSITIVE — AB

## 2021-12-20 LAB — GLUCOSE, CAPILLARY: Glucose-Capillary: 161 mg/dL — ABNORMAL HIGH (ref 70–99)

## 2021-12-20 LAB — PROTIME-INR
INR: 1 (ref 0.8–1.2)
Prothrombin Time: 13.3 seconds (ref 11.4–15.2)

## 2021-12-20 LAB — BRAIN NATRIURETIC PEPTIDE: B Natriuretic Peptide: 2724.1 pg/mL — ABNORMAL HIGH (ref 0.0–100.0)

## 2021-12-20 LAB — SARS CORONAVIRUS 2 (TAT 6-24 HRS): SARS Coronavirus 2: NEGATIVE

## 2021-12-20 IMAGING — DX DG CHEST 2V
2 series · 2 of 2 positions shown · non-contrast
Comparison: CT done on [DATE]

CLINICAL DATA: Patient admitted for TAVR shortness of breath

EXAM:
CHEST - 2 VIEW

[w chest pa]
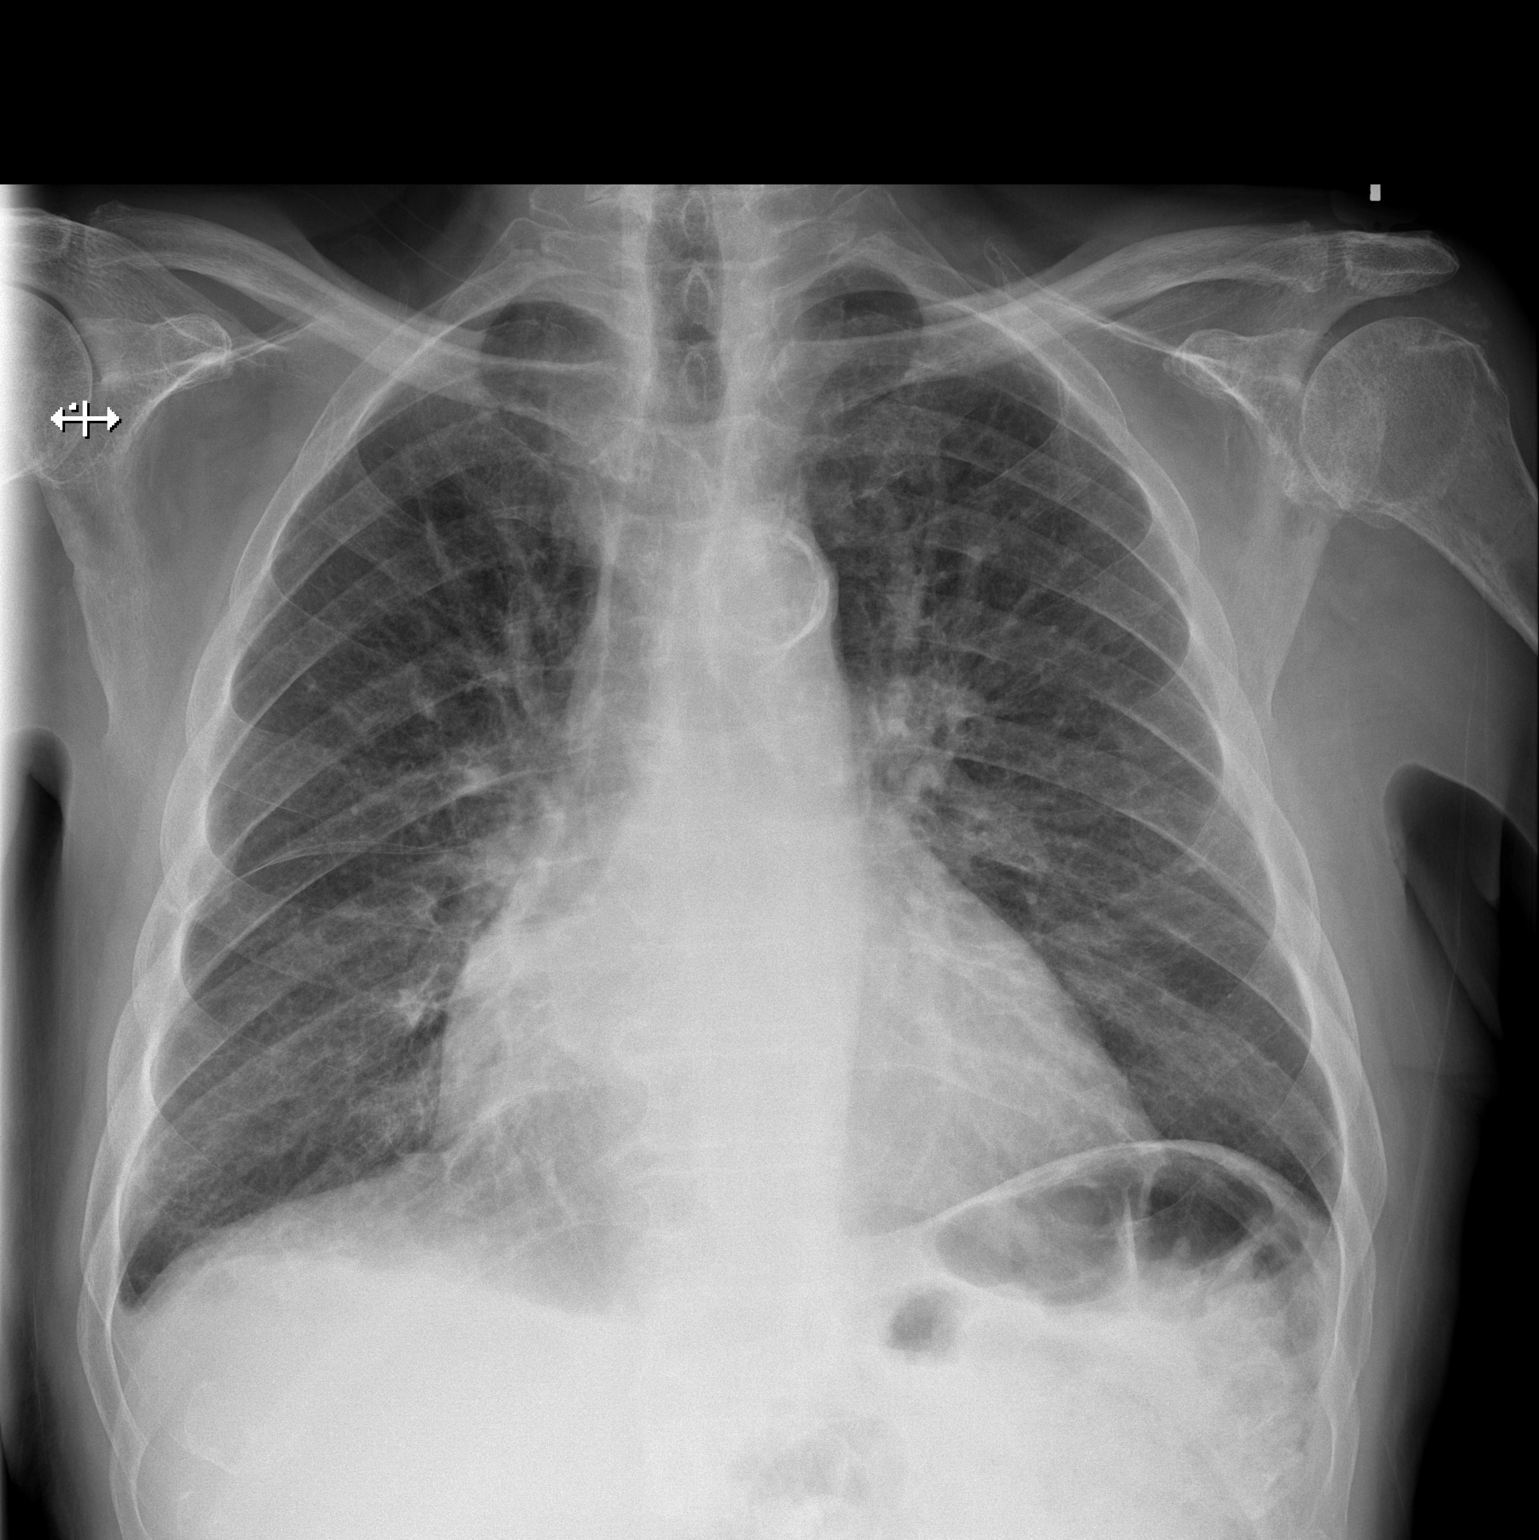

[w chest lat]
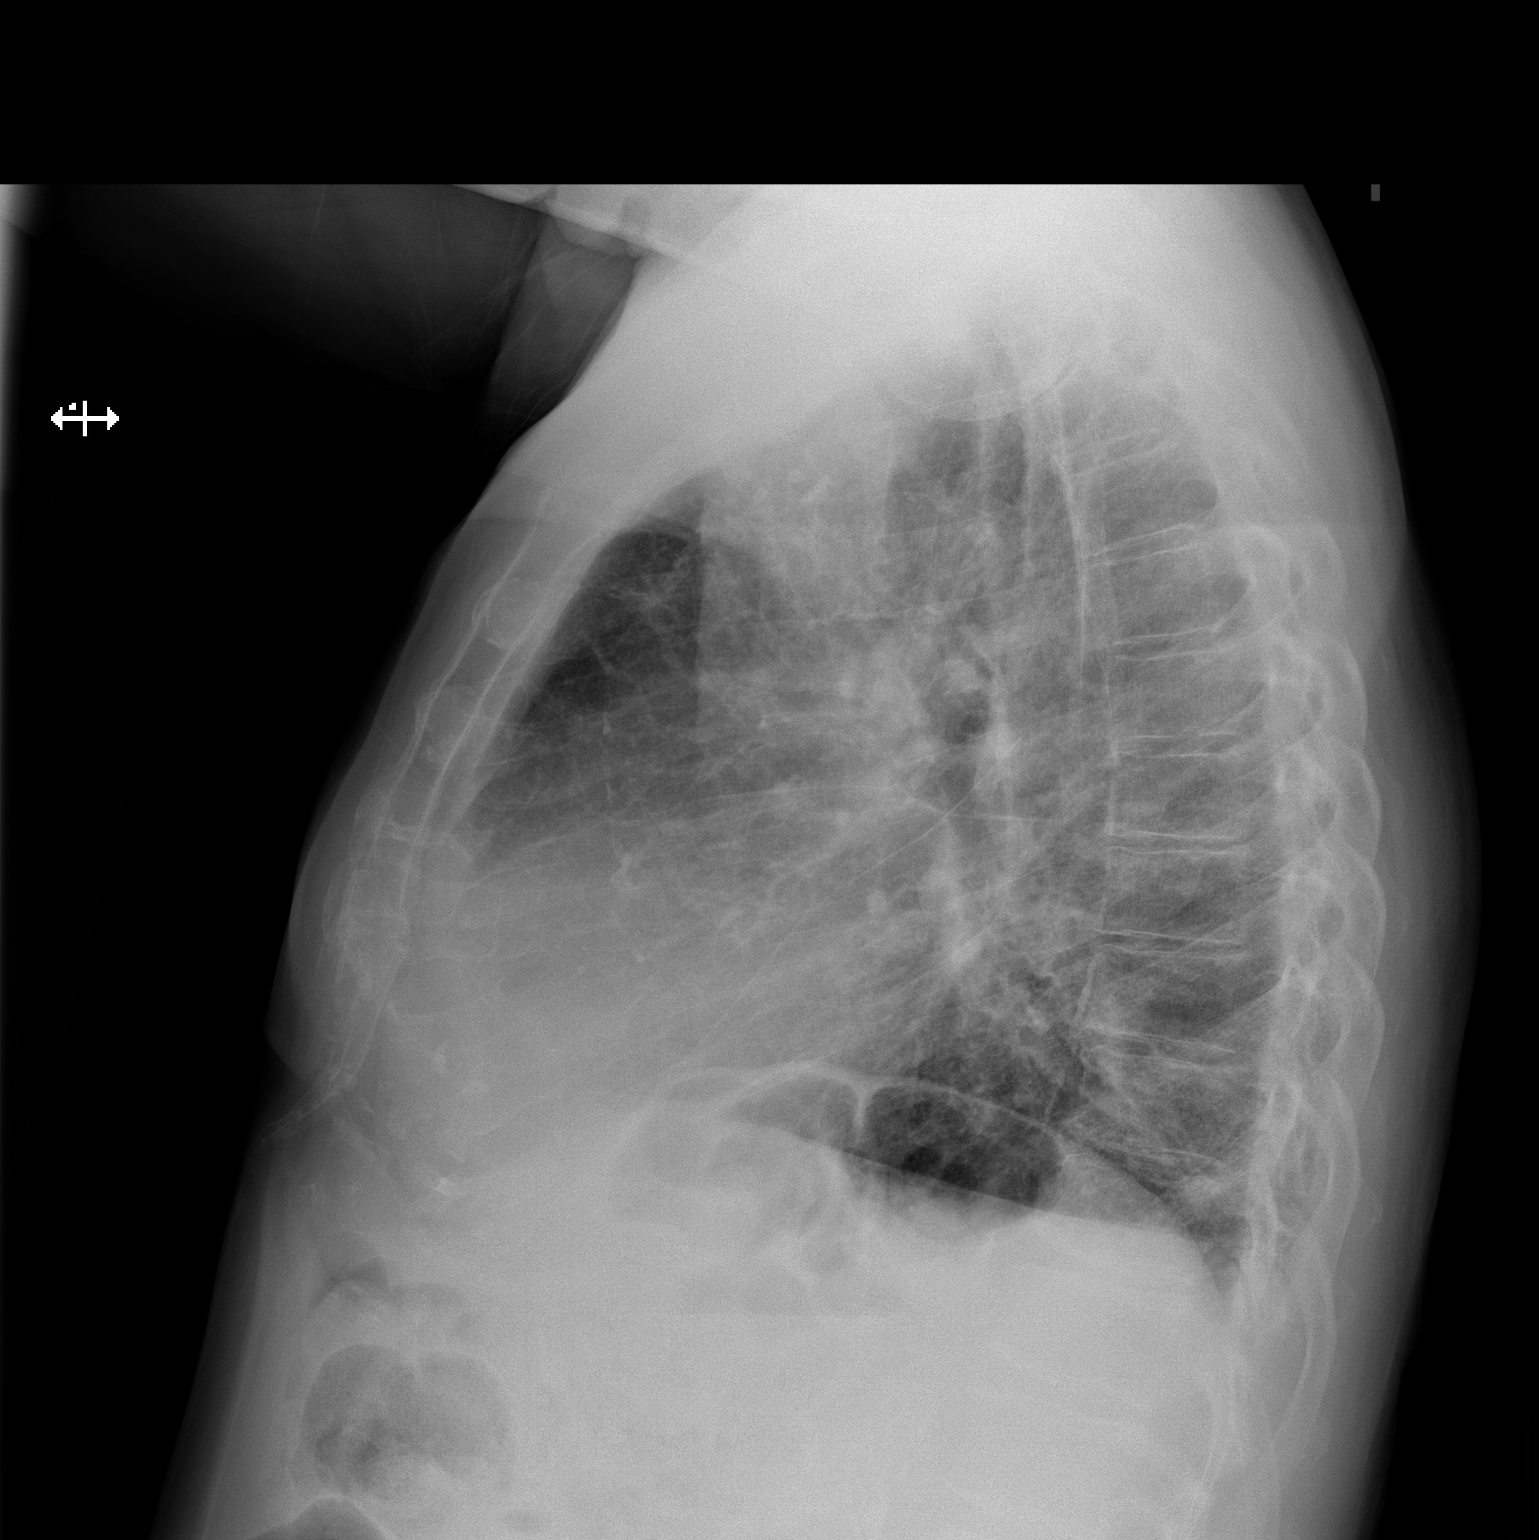

[2 of 2 positions shown; findings below may reference images not displayed]

FINDINGS: Transverse diameter of heart is increased. Central pulmonary vessels
are prominent. There is increase in interstitial markings in the
parahilar regions and lower lung fields. There is no focal pulmonary
consolidation. There is minimal blunting of posterior CP angles.
There is possible decrease in the amount of right pleural effusion.
There is no pneumothorax. Degenerative changes are noted in the left
shoulder.
IMPRESSION: Cardiomegaly. Central pulmonary vessels are prominent suggesting
possible mild CHF. Small pleural effusions.

## 2021-12-20 NOTE — Progress Notes (Signed)
PCP - Dr. Kathryne Eriksson Cardiologist - Dr. Rex Kras  PPM/ICD - Denies  Chest x-ray - 12/20/21 EKG - 12/20/21 Stress Test - 01/14/21 ECHO - 10/21/21 Cardiac Cath - 01/15/21  Sleep Study - Denies   Fasting Blood Sugar - 110-140 Checks Blood Sugar every other day  Blood Thinner Instructions: N/A Aspirin Instructions: N/A  ERAS Protcol - No  COVID TEST- 12/20/21 in PAT   Anesthesia review: Yes, cardiac hx  Patient denies shortness of breath, fever, cough and chest pain at PAT appointment   All instructions explained to the patient, with a verbal understanding of the material. Patient agrees to go over the instructions while at home for a better understanding. Patient also instructed to self quarantine after being tested for COVID-19. The opportunity to ask questions was provided.

## 2021-12-23 ENCOUNTER — Encounter (HOSPITAL_COMMUNITY): Payer: Self-pay

## 2021-12-23 ENCOUNTER — Other Ambulatory Visit: Payer: Self-pay

## 2021-12-23 ENCOUNTER — Encounter (HOSPITAL_COMMUNITY): Payer: Self-pay | Admitting: Cardiovascular Disease

## 2021-12-23 ENCOUNTER — Other Ambulatory Visit (HOSPITAL_COMMUNITY): Payer: Medicare Other

## 2021-12-23 DIAGNOSIS — I5022 Chronic systolic (congestive) heart failure: Secondary | ICD-10-CM

## 2021-12-23 MED ORDER — HEPARIN 30,000 UNITS/1000 ML (OHS) CELLSAVER SOLUTION
Status: DC
  Filled 2021-12-23 (×2): qty 1000

## 2021-12-23 MED ORDER — POTASSIUM CHLORIDE 2 MEQ/ML IV SOLN
80.0000 meq | INTRAVENOUS | Status: DC
  Filled 2021-12-23 (×2): qty 40

## 2021-12-23 MED ORDER — NOREPINEPHRINE 4 MG/250ML-% IV SOLN
0.0000 ug/min | INTRAVENOUS | Status: AC
  Administered 2021-12-24: 2 ug/min via INTRAVENOUS
  Filled 2021-12-23 (×2): qty 250

## 2021-12-23 MED ORDER — MAGNESIUM SULFATE 50 % IJ SOLN
40.0000 meq | INTRAMUSCULAR | Status: DC
  Filled 2021-12-23 (×3): qty 9.85

## 2021-12-23 MED ORDER — CEFAZOLIN SODIUM-DEXTROSE 2-4 GM/100ML-% IV SOLN
2.0000 g | INTRAVENOUS | Status: AC
  Administered 2021-12-24: 2 g via INTRAVENOUS
  Filled 2021-12-23: qty 100

## 2021-12-23 MED ORDER — ENTRESTO 97-103 MG PO TABS: 1.0000 | ORAL_TABLET | Freq: Two times a day (BID) | ORAL | 3 refills | Status: DC

## 2021-12-23 MED ORDER — DEXMEDETOMIDINE HCL IN NACL 400 MCG/100ML IV SOLN
0.1000 ug/kg/h | INTRAVENOUS | Status: DC
  Filled 2021-12-23 (×2): qty 100

## 2021-12-23 NOTE — Anesthesia Preprocedure Evaluation (Addendum)
Anesthesia Evaluation  Patient identified by MRN, date of birth, ID band Patient awake    Reviewed: Allergy & Precautions, H&P , NPO status , Patient's Chart, lab work & pertinent test results  Airway Mallampati: II   Neck ROM: full    Dental   Pulmonary former smoker,    breath sounds clear to auscultation       Cardiovascular hypertension, + Valvular Problems/Murmurs AS  Rhythm:regular Rate:Normal  TTE (10/21/21): severe AS, AVA 0.99 cm2. EF 48%.   Neuro/Psych  Headaches, PSYCHIATRIC DISORDERS Anxiety  Neuromuscular disease    GI/Hepatic PUD, GERD  ,  Endo/Other  diabetes, Type 2  Renal/GU      Musculoskeletal  (+) Arthritis ,   Abdominal   Peds  Hematology   Anesthesia Other Findings   Reproductive/Obstetrics                            Anesthesia Physical Anesthesia Plan  ASA: 3  Anesthesia Plan: General   Post-op Pain Management:    Induction: Intravenous  PONV Risk Score and Plan: 2 and Ondansetron, Dexamethasone and Treatment may vary due to age or medical condition  Airway Management Planned: Oral ETT  Additional Equipment: Arterial line and TEE  Intra-op Plan:   Post-operative Plan: Extubation in OR  Informed Consent: I have reviewed the patients History and Physical, chart, labs and discussed the procedure including the risks, benefits and alternatives for the proposed anesthesia with the patient or authorized representative who has indicated his/her understanding and acceptance.     Dental advisory given  Plan Discussed with: CRNA, Anesthesiologist and Surgeon  Anesthesia Plan Comments: (PAT note written 12/23/2021 by Myra Gianotti, PA-C. )       Anesthesia Quick Evaluation

## 2021-12-23 NOTE — Progress Notes (Signed)
Anesthesia Chart Review:  Case: [id number] Date/Time: 12/24/21 0715   Procedures:      Transcatheter Aortic Valve Replacement-Subclavian (Chest)     TRANSESOPHAGEAL ECHOCARDIOGRAM (TEE)   Anesthesia type: General   Pre-op diagnosis: AO   Location: [facility] ROOM 16 / [facility]   Surgeons: Sherren Mocha, MD     CT Surgeon: Gilford Raid, MD  DISCUSSION: Patient is an 81 year old male scheduled for the above procedure. Per Dr. Vivi Martens note, "His abdominal and pelvic CTA shows heavily calcified femoral and iliac arteries bilaterally.  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   BP elevated at PAT, 190/85. He is on Coreg 3.125 mg BID, clonidine 0.3 mg BID, Lasix 20 mg daily, HCTZ 25 mg daily, Entresto 49-51 mg daily. Changed from lisinopril to Entresto at 10/31/21 cardiology visit. Cardiology has advised not taking any medications on the morning of surgery. He will get vitals on arrival, and if BP significantly elevated at that time, then defer recommendations to cardiologist, CT surgeon, and/or anesthesiologist.  12/20/2021 presurgical COVID-19 test negative.   VS: BP (!) 190/85    Pulse 62    Temp 36.7 C (Oral)    Resp 18    Ht 5\' 11"  (1.803 m)    Wt 83.1 kg    SpO2 100%    BMI 25.57 kg/m  BP Readings from Last 3 Encounters:  12/20/21 (!) 190/85  12/18/21 (!) 189/79  12/05/21 (!) 218/81     PROVIDERS: Christain Sacramento, MD is PCP  Rex Kras, MD is cardiologist   LABS: Labs reviewed: Acceptable for surgery. A1c 6.5% on 08/21/21 ([facility]). (all labs ordered are listed, but only abnormal results are displayed)  Labs Reviewed  SURGICAL PCR SCREEN -  Abnormal; Notable for the following components:      Result Value   Staphylococcus aureus POSITIVE (*)    All other components within normal limits  GLUCOSE, CAPILLARY - Abnormal; Notable for the following components:   Glucose-Capillary 161 (*)    All other components within normal limits  CBC - Abnormal; Notable for the following components:   RBC 3.97 (*)    Hemoglobin 11.2 (*)    HCT 36.1 (*)    All other components within normal limits  COMPREHENSIVE METABOLIC PANEL - Abnormal; Notable for the following components:   Glucose, Bld 142 (*)    BUN 24 (*)    Calcium 8.8 (*)    Albumin 3.2 (*)    All other components within normal limits  URINALYSIS, ROUTINE W REFLEX MICROSCOPIC - Abnormal; Notable for the following components:   Color, Urine STRAW (*)    Hgb urine dipstick SMALL (*)    Protein, ur 100 (*)    Bacteria, UA RARE (*)    All other components within normal limits  BLOOD GAS, ARTERIAL - Abnormal; Notable for the following components:   Allens test (pass/fail) BRACHIAL ARTERY (*)    All other components within normal limits  BRAIN NATRIURETIC PEPTIDE - Abnormal; Notable for the following components:   B Natriuretic Peptide 2,724.1 (*)    All other components within normal limits  SARS CORONAVIRUS 2 (TAT 6-24 HRS)  PROTIME-INR  TYPE AND  SCREEN     IMAGES: CXR 12/20/2021: FINDINGS: Transverse diameter of heart is increased. Central pulmonary vessels are prominent. There is increase in interstitial markings in the parahilar regions and lower lung fields. There is no focal pulmonary consolidation. There is minimal blunting of posterior CP angles. There is possible decrease in the amount of right pleural effusion. There is no pneumothorax. Degenerative changes are noted in the left shoulder. IMPRESSION: Cardiomegaly. Central pulmonary vessels are prominent suggesting possible mild CHF. Small pleural effusions.   CTA Chest/abd/pelvis 12/05/2021: IMPRESSION: 1.  Vascular findings and measurements pertinent to potential TAVR procedure, as detailed above. [See full report] 2. Severe thickening and calcification of the aortic valve, compatible with reported clinical history of severe aortic stenosis. 3. Mild cardiomegaly with moderate right and small left pleural effusions, as well as evidence of interstitial pulmonary edema in the lungs; imaging findings concerning for congestive heart failure. 4. Small amount of pericardial fluid and/or thickening. No pericardial calcification. 5. Aortic atherosclerosis, in addition to left main and three-vessel coronary artery disease. 6. 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 7. Small volume of ascites. 8. Additional incidental findings, as above. [See full report]      EKG: 12/20/2021: Sinus rhythm with first degree AV block Non-specific intra-ventricular conduction delay Minimal voltage criteria for LVH, may be normal variant ( Cornell product ) Nonspecific T wave abnormality Abnormal ECG When compared with ECG of 15-Jan-2021 08:01, No significant change since last tracing Confirmed by Croitoru, Mihai [phone]) on 12/20/2021 8:33:45 PM   CV: CT Coronary 12/05/2021: IMPRESSION: 1. Tricuspid aortic valve with diffuse severe calcifications. Severe aortic stenosis (calcium score 2847). 2. Annular measurements appropriate for 29 mm S3 (553 mm2). 3. Mild single calcification under the [place]. 4. Sufficient coronary to annulus distance. 5. Optimal Fluoroscopic Angle for Delivery: LAO 8 CAU 9 6. Mildly reduced left ventricular function, LVEF=41%. Global hypokinesis. 7. Dilated pulmonary artery suggestive of pulmonary hypertension.   Echo 10/21/2021: IMPRESSIONS   1. Compared to echo report from September 2022, LVEF is depressed and AS  is severe.   2. Global longitudinal strain is -13.4%. Left ventricular ejection  fraction, by estimation, is 40%%. The left ventricle has moderately   decreased function. The left ventricle demonstrates global hypokinesis.  The left ventricular internal cavity size was  mildly dilated. There is mild left ventricular hypertrophy.   3. Right ventricular systolic function is mildly reduced. The right  ventricular size is normal.   4. Left atrial size was moderately dilated.   5. Right atrial size was mildly dilated.   6. Mild mitral valve regurgitation.   7. AV is thickened, calcified with restricted motion motion. Peak and  mean gradients through the valve are 48 and 30 mm Hg respectively. AVA  (VTI) is 0.99cm2. Dimensionless index is 0.25 All consistent with severe  AS. Marland Kitchen Aortic valve regurgitation is not   visualized.   8. The inferior vena cava is dilated in size with <50% respiratory  variability, suggesting right atrial pressure of 15 mmHg.    Left heart catheterization 01/15/2021: LV: Normal LV systolic function.  EDP mildly elevated at 20 mmHg.  There is 22 mmHg peak to peak pressure gradient across the aortic valve.  Findings are consistent with mild aortic stenosis. Very mild coronary calcification and minimal luminal irregularity in the coronary vessels.  Right dominant circulation. - Impression: No significant coronary disease angiography.  Coronary calcification is evident.  Normal LVEF and mild aortic stenosis. 50 mL contrast used.  Lexiscan Tetrofosmin Stress Test  12/24/2020: Nondiagnostic ECG stress. The left ventricle is dilated in stress images more than rest images, LV end-diastolic volume 938 mL.  There is mild diaphragmatic attenuation artifact in the inferior wall.  Superimposed on this there is a moderate-sized moderate decrease in counts in the basal inferior and inferolateral wall suggestive of ischemia. Gated SPECT imaging of the left ventricle was abnormal, demonstrating hypokinesis of the mid inferolateral wall, mid inferior wall and basal inferolateral wall.  Stress LV EF is mildly dysfunctional 48%.  No  previous exam available for comparison. High risk study.    Carotid artery duplex 12/19/2020:  Minimal stenosis in the right internal carotid artery (1-15%). Doppler  velocity suggests stenosis in the right Minimal stenosis in the left  internal carotid artery (1-15%). Doppler velocity suggests stenosis in the  left external carotid artery (<50%).  Mild heterogeneous plaque noted bilaterally.  Antegrade right vertebral artery flow. Antegrade left vertebral artery  flow.  External carotid artery stenosis is probably source of bruit.  Follow up  study is appropriate if clinically indicated.   Past Medical History:  Diagnosis Date   Abnormal blood chemistry test    Actinic keratosis    Arthritis    AV block    Blood transfusion without reported diagnosis    Bradycardia    Degeneration of lumbar or lumbosacral intervertebral disc    Deviated nasal septum    Diabetes mellitus without complication (HCC)    Gastroesophageal reflux disease without esophagitis    Generalized anxiety disorder    Gout with tophus    Headache    Hearing loss    Hyperlipidemia    Hypertension    Insomnia    Murmur    Aortic stenosis   Nasal turbinate hypertrophy    Osteoarthritis of wrist    PAC (premature atrial contraction)    Peptic ulcer    Peripheral neuropathy    PVC (premature ventricular contraction)    Seasonal allergic rhinitis due to pollen     Past Surgical History:  Procedure Laterality Date   COLONOSCOPY     ELBOW SURGERY     pinched nerve on both arms   INGUINAL HERNIA REPAIR Right 10/30/2018   Procedure: OPEN REPAIR OF INCARCERATED RIGHT INGUINAL HERNIA WITH MESH;  Surgeon: Greer Pickerel, MD;  Location: [facility];  Service: General;  Laterality: Right;   LEFT HEART CATH AND CORONARY ANGIOGRAPHY N/A 01/15/2021   Procedure: LEFT HEART CATH AND CORONARY ANGIOGRAPHY;  Surgeon: Adrian Prows, MD;  Location: [facility];  Service: Cardiovascular;  Laterality: N/A;   TOTAL KNEE  ARTHROPLASTY Right 02/06/2021   Procedure: TOTAL KNEE ARTHROPLASTY;  Surgeon: Sydnee Cabal, MD;  Location: [facility];  Service: Orthopedics;  Laterality: Right;  with adductor canal   WRIST SURGERY     pinched nerve    MEDICATIONS:  acetaminophen (TYLENOL) 500 MG tablet   allopurinol (ZYLOPRIM) 300 MG tablet   aspirin EC 81 MG tablet   atorvastatin (LIPITOR) 20 MG tablet   carvedilol (COREG) 3.125 MG tablet   cloNIDine (CATAPRES) 0.3 MG tablet   furosemide (LASIX) 20 MG tablet   hydrochlorothiazide (HYDRODIURIL) 25 MG tablet   metFORMIN (GLUCOPHAGE) 1000 MG tablet   pantoprazole (PROTONIX) 40 MG tablet   sacubitril-valsartan (ENTRESTO) 49-51 MG   sacubitril-valsartan (ENTRESTO) 97-103 MG   No current facility-administered medications for this encounter.    Myra Gianotti, PA-C Surgical Short Stay/Anesthesiology [facility] Phone [phone] [facility] Phone [phone] 12/23/2021 9:55 AM

## 2021-12-23 NOTE — H&P (Signed)
[address]       Nicholas [place] [address]             [phone]      Cardiothoracic Surgery Admission History and Physical   PCP is Redmond Pulling, Jama Flavors, MD Referring Provider is Sherren Mocha, MD Primary Cardiologist is Rex Kras, DO   Reason for admission:  Severe aortic stenosis   HPI:   The patient is an 81 year old gentleman with history of hypertension, hyperlipidemia, type 2 diabetes, previous smoking, and aortic stenosis who was referred by Dr. Terri Skains to the structural heart valve clinic for consideration of aortic valve replacement.  Previous echocardiogram in January 2019 showed mild aortic stenosis with a mean gradient of 17 mmHg.  Serial echocardiograms since then has shown increasing mean gradient which was 27 mmHg in August 2022.  His most recent echocardiogram on 10/21/2021 showed a mean gradient of 30 mmHg with a valve area of 0.99 cm.  Dimensionless index was 0.25.  Left ventricular ejection fraction was 40% with global hypokinesis.  There is mild LV dilation with diastolic diameter of 5.3 cm end-systolic diameter of 4.8 cm.  An echocardiogram in January 2022 showed an ejection fraction of 55 to 60%.  His ejection fraction was 50 to 55% on echo in August 2022.   The patient states that he does exercise for 1 to 2 hours/day without any exertional symptoms.  He denies any chest pain or pressure.  He has had no shortness of breath or fatigue.  He is here today with his sister-in-law, [name], because his wife is in poor health.  He provides care for her at home.       Past Medical History:  Diagnosis Date   Abnormal blood chemistry test     Actinic keratosis     Arthritis     AV block     Blood transfusion without reported diagnosis     Bradycardia     Degeneration of lumbar or lumbosacral intervertebral disc     Deviated nasal septum     Diabetes mellitus without complication (HCC)     Gastroesophageal reflux disease without esophagitis      Generalized anxiety disorder     Gout with tophus     Headache     Hearing loss     Hyperlipidemia     Hypertension     Insomnia     Ischemia     Murmur     Nasal turbinate hypertrophy     Osteoarthritis of wrist     PAC (premature atrial contraction)     Peptic ulcer     Peripheral neuropathy     PVC (premature ventricular contraction)     Seasonal allergic rhinitis due to pollen             Past Surgical History:  Procedure Laterality Date   COLONOSCOPY       ELBOW SURGERY        pinched nerve on both arms   INGUINAL HERNIA REPAIR Right 10/30/2018    Procedure: OPEN REPAIR OF INCARCERATED RIGHT INGUINAL HERNIA WITH MESH;  Surgeon: Greer Pickerel, MD;  Location: [place];  Service: General;  Laterality: Right;   LEFT HEART CATH AND CORONARY ANGIOGRAPHY N/A 01/15/2021    Procedure: LEFT HEART CATH AND CORONARY ANGIOGRAPHY;  Surgeon: Adrian Prows, MD;  Location: [facility];  Service: Cardiovascular;  Laterality: N/A;   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    Procedure:  TOTAL KNEE ARTHROPLASTY;  Surgeon: Sydnee Cabal, MD;  Location: [facility];  Service: Orthopedics;  Laterality: Right;  with adductor canal   WRIST SURGERY        pinched nerve           Family History  Problem Relation Age of Onset   Heart attack Mother        Social History         Socioeconomic History   Marital status: Married      Spouse name: Not on file   Number of children: 3   Years of education: Not on file   Highest education level: Not on file  Occupational History   Not on file  Tobacco Use   Smoking status: Former      Packs/day: 0.50      Years: 15.00      Pack years: 7.50      Types: Cigarettes      Quit date: 1996      Years since quitting: 27.1   Smokeless tobacco: Never  Vaping Use   Vaping Use: Never used  Substance and Sexual Activity   Alcohol use: No   Drug use: No   Sexual activity: Not on file      Comment: married  Other Topics Concern   Not on file  Social  History Narrative   Not on file    Social Determinants of Health    Financial Resource Strain: Not on file  Food Insecurity: Not on file  Transportation Needs: Not on file  Physical Activity: Not on file  Stress: Not on file  Social Connections: Not on file  Intimate Partner Violence: Not on file             Prior to Admission medications   Medication Sig Start Date End Date Taking? Authorizing Provider  acetaminophen (TYLENOL) 500 MG tablet Take 500 mg by mouth every 8 (eight) hours as needed for moderate pain.     Yes [provider]  allopurinol (ZYLOPRIM) 300 MG tablet Take 300 mg by mouth daily.     Yes [provider]  atorvastatin (LIPITOR) 20 MG tablet Take 1 tablet (20 mg total) by mouth 2 (two) times a week. Patient taking differently: Take 20 mg by mouth See admin instructions. Take one tablet (20 mg) by mouth twice weekly - Monday and Thursday 11/09/17   Yes Deboraha Sprang, MD  carvedilol (COREG) 3.125 MG tablet Take 1 tablet (3.125 mg total) by mouth 2 (two) times daily with a meal. 11/06/17   Yes Deboraha Sprang, MD  cloNIDine (CATAPRES) 0.3 MG tablet Take 0.6 mg by mouth 2 (two) times daily.      Yes [provider]  furosemide (LASIX) 20 MG tablet Take 1 tablet (20 mg total) by mouth daily. 12/06/21 01/05/22 Yes Sherren Mocha, MD  hydrochlorothiazide (HYDRODIURIL) 25 MG tablet Take 25 mg by mouth daily.     Yes [provider]  metFORMIN (GLUCOPHAGE) 1000 MG tablet Take 1 tablet (1,000 mg total) by mouth 2 (two) times daily with a meal. 01/17/21   Yes Adrian Prows, MD  pantoprazole (PROTONIX) 40 MG tablet Take 40 mg by mouth daily.     Yes [provider]  sacubitril-valsartan (ENTRESTO) 49-51 MG Take 2 tablets by mouth 2 (two) times daily.     Yes [provider]  aspirin EC 81 MG tablet Take 1 tablet (81 mg total) by mouth daily. Swallow whole. Patient not taking:  Reported on 12/17/2021 01/03/21     Tolia, Sunit, DO   sacubitril-valsartan (ENTRESTO) 97-103 MG Take 1 tablet by mouth 2 (two) times daily. Patient not taking: Reported on 12/18/2021 12/09/21 03/09/22   Rex Kras, DO            Current Outpatient Medications  Medication Sig Dispense Refill   acetaminophen (TYLENOL) 500 MG tablet Take 500 mg by mouth every 8 (eight) hours as needed for moderate pain.       allopurinol (ZYLOPRIM) 300 MG tablet Take 300 mg by mouth daily.       atorvastatin (LIPITOR) 20 MG tablet Take 1 tablet (20 mg total) by mouth 2 (two) times a week. (Patient taking differently: Take 20 mg by mouth See admin instructions. Take one tablet (20 mg) by mouth twice weekly - Monday and Thursday) 10 tablet 10   carvedilol (COREG) 3.125 MG tablet Take 1 tablet (3.125 mg total) by mouth 2 (two) times daily with a meal. 60 tablet 11   cloNIDine (CATAPRES) 0.3 MG tablet Take 0.6 mg by mouth 2 (two) times daily.        furosemide (LASIX) 20 MG tablet Take 1 tablet (20 mg total) by mouth daily. 30 tablet 6   hydrochlorothiazide (HYDRODIURIL) 25 MG tablet Take 25 mg by mouth daily.       metFORMIN (GLUCOPHAGE) 1000 MG tablet Take 1 tablet (1,000 mg total) by mouth 2 (two) times daily with a meal.       pantoprazole (PROTONIX) 40 MG tablet Take 40 mg by mouth daily.       sacubitril-valsartan (ENTRESTO) 49-51 MG Take 2 tablets by mouth 2 (two) times daily.       aspirin EC 81 MG tablet Take 1 tablet (81 mg total) by mouth daily. Swallow whole. (Patient not taking: Reported on 12/17/2021) 90 tablet 3   sacubitril-valsartan (ENTRESTO) 97-103 MG Take 1 tablet by mouth 2 (two) times daily. (Patient not taking: Reported on 12/18/2021) 180 tablet 3    No current facility-administered medications for this visit.      No Known Allergies       Review of Systems:               General:                      normal appetite, normal energy, no weight gain, no weight loss, no fever             Cardiac:                       no chest pain with exertion,  no chest pain at rest, no SOB with exertion, no resting SOB, no PND, no orthopnea, no palpitations, no arrhythmia, no atrial fibrillation, no LE edema, no dizzy spells, no syncope             Respiratory:                 no shortness of breath, no home oxygen, no productive cough, no dry cough, no bronchitis, no wheezing, no hemoptysis, no asthma, no pain with inspiration or cough, no sleep apnea, no CPAP at night             GI:                               no difficulty swallowing, no reflux,  no frequent heartburn, no hiatal hernia, no abdominal pain, no constipation, no diarrhea, no hematochezia, no hematemesis, no melena             GU:                              no dysuria,  no frequency, no urinary tract infection, no hematuria, no enlarged prostate, no kidney stones, no kidney disease             Vascular:                     no pain suggestive of claudication, no pain in feet, no leg cramps, no varicose veins, no DVT, no non-healing foot ulcer             Neuro:                         no stroke, no TIA's, no seizures, no headaches, no temporary blindness one eye,  no slurred speech, no peripheral neuropathy, no chronic pain, no instability of gait, no memory/cognitive dysfunction             Musculoskeletal:         + arthritis, no joint swelling, no myalgias, no difficulty walking, normal mobility              Skin:                            no rash, no itching, no skin infections, no pressure sores or ulcerations             Psych:                         no anxiety, no depression, no nervousness, no unusual recent stress             Eyes:                           no blurry vision, no floaters, no recent vision changes, + wears glasses              ENT:                            + hearing loss, no loose or painful teeth, + dentures             Hematologic:               + easy bruising, no abnormal bleeding, no clotting disorder, no frequent epistaxis             Endocrine:                    + diabetes, does check CBG's at home                            Physical Exam:               BP (!) 189/79    Pulse 68    Resp 20    Ht 5\' 11"  (1.803 m)    Wt 195 lb (88.5 kg)    SpO2 97% Comment: RA   BMI 27.20 kg/m  General:                      Elderly,  well-appearing             HEENT:                       Unremarkable, NCAT, PERLA, EOMI             Neck:                           no JVD, no bruits, no adenopathy              Chest:                          clear to auscultation, symmetrical breath sounds, no wheezes, no rhonchi              CV:                              RRR, 3/6 systolic murmur RSB, no diastolic murmur             Abdomen:                    soft, non-tender, no masses              Extremities:                 warm, well-perfused, pulses not palpable at ankle, no lower extremity edema             Rectal/GU                   Deferred             Neuro:                         Grossly non-focal and symmetrical throughout             Skin:                            Clean and dry, no rashes, no breakdown   Diagnostic Tests:   ECHOCARDIOGRAM REPORT         Patient Name:   Kentavius Longanecker Date of Exam: 10/21/2021  Medical Rec #:  [id number]  Height:       70.0 in  Accession #:    [id number] Weight:       198.0 lb  Date of Birth:  February 22, 1941  BSA:          2.078 m  Patient Age:    29 years   BP:           193/82 mmHg  Patient Gender: M          HR:           74 bpm.  Exam Location:  Outpatient   Procedure: 2D Echo, 3D Echo, Color Doppler, Strain Analysis and Cardiac  Doppler   Indications:    I35.0 Aortic stenosis     History:        Patient has prior history of Echocardiogram examinations,  most  recent 06/18/2021. [facility] revelaed very mild irregular CAD,  normal                  EF, Signs/Symptoms:SEM @[facility] ; Risk Factors:Hypertension,                  Dyslipidemia, Diabetes and Former Smoker. Previous echo  revealed                   LVEF 55%, moderate AS with mean gradient of 25 mmHg and  PAP 49                  mmhg.                  Previous nuc med done 12/24/20, revealed moderate sized  moderate                  reduction in counts in the basal inferior and  inferolateral wall                  suggestive of ischemia. Hypokinesis in mid inferolateral,  mid                  inferior wall and basal inferolateral walls EF 48%.     Sonographer:    Lenard Galloway [facility], RDCS  Referring Phys: [id number] [place]     1. Compared to echo report from September 2022, LVEF is depressed and AS  is severe.   2. Global longitudinal strain is -13.4%. Left ventricular ejection  fraction, by estimation, is 40%%. The left ventricle has moderately  decreased function. The left ventricle demonstrates global hypokinesis.  The left ventricular internal cavity size was  mildly dilated. There is mild left ventricular hypertrophy.   3. Right ventricular systolic function is mildly reduced. The right  ventricular size is normal.   4. Left atrial size was moderately dilated.   5. Right atrial size was mildly dilated.   6. Mild mitral valve regurgitation.   7. AV is thickened, calcified with restricted motion motion. Peak and  mean gradients through the valve are 48 and 30 mm Hg respectively. AVA  (VTI) is 0.99cm2. Dimensionless index is 0.25 All consistent with severe  AS. Marland Kitchen Aortic valve regurgitation is not   visualized.   8. The inferior vena cava is dilated in size with <50% respiratory  variability, suggesting right atrial pressure of 15 mmHg.   FINDINGS   Left Ventricle: Global longitudinal strain is -13.4%. Left ventricular  ejection fraction, by estimation, is 40%%. The left ventricle has  moderately decreased function. The left ventricle demonstrates global  hypokinesis. The left ventricular internal  cavity size was mildly dilated. There is mild left ventricular  hypertrophy.   Right Ventricle: The  right ventricular size is normal. Right vetricular  wall thickness was not assessed. Right ventricular systolic function is  mildly reduced.   Left Atrium: Left atrial size was moderately dilated.   Right Atrium: Right atrial size was mildly dilated.   Pericardium: Trivial pericardial effusion is present.   Mitral Valve: There is mild thickening of the mitral valve leaflet(s).  Mild mitral valve regurgitation.   Tricuspid Valve: The tricuspid valve is normal in structure. Tricuspid  valve regurgitation is mild.   Aortic Valve: AV is thickened, calcified with restricted motion motion.  Peak and mean gradients through the valve are 48 and 30 mm Hg  respectively. AVA (VTI) is 0.99cm2. Dimensionless index is 0.25 All  consistent  with severe AS. Aortic valve  regurgitation is not visualized. Aortic valve mean gradient measures 30.0  mmHg. Aortic valve peak gradient measures 48.2 mmHg. Aortic valve area, by  VTI measures 0.94 cm.   Pulmonic Valve: The pulmonic valve was normal in structure. Pulmonic valve  regurgitation is trivial.   Aorta: The aortic root and ascending aorta are structurally normal, with  no evidence of dilitation.   Venous: The inferior vena cava is dilated in size with less than 50%  respiratory variability, suggesting right atrial pressure of 15 mmHg.   IAS/Shunts: No atrial level shunt detected by color flow Doppler.      LEFT VENTRICLE  PLAX 2D  LVIDd:         5.30 cm   Diastology  LVIDs:         4.80 cm   LV e' medial:    5.70 cm/s  LV PW:         1.30 cm   LV E/e' medial:  25.1  LV IVS:        0.90 cm   LV e' lateral:   5.93 cm/s  LVOT diam:     2.20 cm   LV E/e' lateral: 24.1  LV SV:         84  LV SV Index:   40        2D Longitudinal Strain  LVOT Area:     3.80 cm  2D Strain GLS (A2C):   -15.0 %                           2D Strain GLS (A3C):   -11.9 %                           2D Strain GLS (A4C):   -13.4 %                           2D Strain  GLS Avg:     -13.4 %                              3D Volume EF:                           3D EF:        41 %                           LV EDV:       216 ml                           LV ESV:       127 ml                           LV SV:        90 ml   RIGHT VENTRICLE             IVC  RV Basal diam:  5.00 cm     IVC diam: 2.00 cm  RV Mid diam:    4.20 cm  RV S prime:     14.60 cm/s  TAPSE (M-mode): 2.6 cm  RVSP:  54.5 mmHg   LEFT ATRIUM              Index        RIGHT ATRIUM           Index  LA diam:        4.50 cm  2.17 cm/m   RA Pressure: 8.00 mmHg  LA Vol (A2C):   99.2 ml  47.73 ml/m  RA Area:     21.70 cm  LA Vol (A4C):   93.0 ml  44.75 ml/m  RA Volume:   72.70 ml  34.98 ml/m  LA Biplane Vol: 100.0 ml 48.11 ml/m   AORTIC VALVE  AV Area (Vmax):    1.07 cm  AV Area (Vmean):   1.03 cm  AV Area (VTI):     0.94 cm  AV Vmax:           347.00 cm/s  AV Vmean:          241.000 cm/s  AV VTI:            0.896 m  AV Peak Grad:      48.2 mmHg  AV Mean Grad:      30.0 mmHg  LVOT Vmax:         98.00 cm/s  LVOT Vmean:        65.500 cm/s  LVOT VTI:          0.221 m  LVOT/AV VTI ratio: 0.25     AORTA  Ao Root diam: 3.50 cm  Ao Asc diam:  3.10 cm   MITRAL VALVE                TRICUSPID VALVE  MV Area (PHT): 4.63 cm     TR Peak grad:   46.5 mmHg  MV Decel Time: 164 msec     TR Vmax:        341.00 cm/s  MV E velocity: 143.00 cm/s  Estimated RAP:  8.00 mmHg  MV A velocity: 50.80 cm/s   RVSP:           54.5 mmHg  MV E/A ratio:  2.81                              SHUNTS                              Systemic VTI:  0.22 m                              Systemic Diam: 2.20 cm   Dorris Carnes MD  Electronically signed by Dorris Carnes MD  Signature Date/Time: 10/21/2021/5:00:51 PM         Final     Physicians   Panel Physicians Referring Physician Case Authorizing Physician  Adrian Prows, MD (Primary)        Procedures   LEFT HEART CATH AND CORONARY ANGIOGRAPHY     Conclusion   Left heart catheterization 01/15/2021: LV: Normal LV systolic function.  EDP mildly elevated at 20 mmHg.  There is 22 mmHg peak to peak pressure gradient across the aortic valve.  Findings are consistent with mild aortic stenosis. Very mild coronary calcification and minimal luminal irregularity in the coronary vessels.  Right dominant circulation.   Impression: No significant coronary disease angiography.  Coronary calcification is evident.  Normal LVEF and mild aortic stenosis. 50 mL contrast used. Recommendations   Antiplatelet/Anticoag No indication for antiplatelet therapy at this time .    Indications   Abnormal nuclear stress test [R94.39 (ICD-10-CM)]  Nonrheumatic aortic valve stenosis [I35.0 (ICD-10-CM)]    Procedural Details   Technical Details Procedure performed:  Left heart catheterization including hemodynamic monitoring of the left ventricle, LV gram, selective right and left coronary arteriography.  Indication:  Nicholas Shelton  is a 81 y.o. male  with history of hypertension,  hyperlipidemia,  Diabetes Mellitus   who presents for preoperative cardiovascular stratification, scheduled for elective knee replacement.  Had an abnormal nuclear stress test.  In view of multiple cardiovascular factors, moderate aortic stenosis, felt it was best to proceed with coronary angiography to exclude major proximal coronary artery disease.    Technique: Under sterile precautions using a 6 French right radial  arterial access, a 6 French sheath was introduced into the right radial artery. A 5 [place] Tig 4 catheter was advanced into the ascending aorta selective  right coronary artery and left coronary artery was cannulated and angiography was performed in multiple views. The catheter was pulled back Out of the body over exchange length J-wire.  Same  Catheter was used to perform LV gram which was performed in RAO projection.  Catheter exchanged out of the body over J-Wire. NO  immediate complications noted. Patient tolerated the procedure well. Contrast used: 50 mL. Estimated blood loss <50 mL.   During this procedure medications were administered to achieve and maintain moderate conscious sedation while the patient's heart rate, blood pressure, and oxygen saturation were continuously monitored and I was present face-to-face 100% of this time.    Medications (Filter: Administrations occurring from 1045 to 1142 on 01/15/21)  important  Continuous medications are totaled by the amount administered until 01/15/21 1142.    Heparin (Porcine) in NaCl 1000-0.9 UT/500ML-% SOLN (mL) Total volume:  1,000 mL Date/Time Rate/Dose/Volume Action    01/15/21 1052 500 mL Given    1052 500 mL Given      fentaNYL (SUBLIMAZE) injection (mcg) Total dose:  25 mcg Date/Time Rate/Dose/Volume Action    01/15/21 1104 25 mcg Given      midazolam (VERSED) injection (mg) Total dose:  2 mg Date/Time Rate/Dose/Volume Action    01/15/21 1104 2 mg Given      lidocaine (PF) (XYLOCAINE) 1 % injection (mL) Total volume:  2 mL Date/Time Rate/Dose/Volume Action    01/15/21 1108 2 mL Given      Radial Cocktail/Verapamil only (mL) Total volume:  5 mL Date/Time Rate/Dose/Volume Action    01/15/21 1115 5 mL Given    Canceled Entry      heparin sodium (porcine) injection (Units) Total dose:  5,000 Units Date/Time Rate/Dose/Volume Action    01/15/21 1120 5,000 Units Given      iohexol (OMNIPAQUE) 350 MG/ML injection (mL) Total volume:  50 mL Date/Time Rate/Dose/Volume Action    01/15/21 1128 50 mL Given      Sedation Time   Sedation Time Physician-1: 21 minutes 48 seconds Contrast   Medication Name Total Dose  iohexol (OMNIPAQUE) 350 MG/ML injection 50 mL    Radiation/Fluoro   Fluoro time: 3.6 (min) DAP: 10273 (mGycm2) Cumulative Air Kerma: 178 (mGy) Coronary Findings   Diagnostic Dominance: Right Left Anterior Descending  There is mild diffuse disease throughout the  vessel.    Left Circumflex  The vessel exhibits minimal luminal irregularities.    Right Coronary Artery  The vessel exhibits minimal luminal irregularities.    Intervention    No interventions have been documented.    Left Heart   Left Ventricle The left ventricular size is normal. The left ventricular systolic function is normal. LV end diastolic pressure is mildly elevated. The left ventricular ejection fraction is 55-65% by visual estimate. No regional wall motion abnormalities. There is no evidence of mitral regurgitation.    Coronary Diagrams   Diagnostic Dominance: Right Intervention   Implants      No implant documentation for this case.    Syngo Images    Show images for CARDIAC CATHETERIZATION Images on Long Term Storage    Show images for [place], [place] to Procedure Log   Procedure Log    Hemo Data   Flowsheet Row Most Recent Value  AO Systolic Pressure 161 mmHg  AO Diastolic Pressure 52 mmHg  AO Mean 87 mmHg  LV Systolic Pressure 096 mmHg  LV Diastolic Pressure 2 mmHg  LV EDP 9 mmHg  AOp Systolic Pressure 045 mmHg  AOp Diastolic Pressure 42 mmHg  AOp Mean Pressure 67 mmHg  LVp Systolic Pressure 409 mmHg  LVp Diastolic Pressure 7 mmHg  LVp EDP Pressure 20 mmHg      ADDENDUM REPORT: 12/05/2021 12:31   CLINICAL DATA:  Severe Aortic Stenosis.   EXAM: Cardiac TAVR CT   TECHNIQUE: A non-contrast, gated CT scan was obtained with axial slices of 3 mm through the heart for aortic valve calcium scoring. A 110 kV retrospective, gated, contrast cardiac scan was obtained. Gantry rotation speed was 250 msecs and collimation was 0.6 mm. Nitroglycerin was not given. The 3D data set was reconstructed in 5% intervals of the 0-95% of the R-R cycle. Systolic and diastolic phases were analyzed on a dedicated workstation using MPR, MIP, and VRT modes. The patient received 100 cc of contrast.   FINDINGS: Image quality: Excellent.   Noise artifact is:  Limited.   Valve Morphology: Tricuspid aortic valve. Severely calcified with diffuse calcium. Severely restricted leaflet motion in systole.   Aortic Valve Calcium score: 2847   Aortic annular dimension:   Phase assessed: 15%   Annular area: 553 mm2   Annular perimeter: 85.2 mm   Max diameter: 30.1 mm   Min diameter: 24.2 mm   Annular and subannular calcification: Mild single calcification under the [place].   Membranous septum length: 11.8 mm   Optimal coplanar projection: LAO 8 CAU 9   Coronary Artery Height above Annulus:   Left Main: 16.4 mm   Right Coronary: 25.5 mm   Sinus of Valsalva Measurements:   Non-coronary: 36 mm   Right-coronary: 36 mm   Left-coronary: 36 mm   Sinus of Valsalva Height:   Non-coronary: 29.1 mm   Right-coronary: 28.8 mm   Left-coronary: 25.9 mm   Sinotubular Junction: 31 mm.  Circumferential calcifications noted.   Ascending Thoracic Aorta: 38 mm   Coronary Arteries: Normal coronary origin. Right dominance. The study was performed without use of NTG and is insufficient for plaque evaluation. Please refer to recent cardiac catheterization for coronary assessment. 3-vessel coronary calcifications noted.   Cardiac Morphology:   Right Atrium: Right atrial size is dilated.   Right Ventricle: The right ventricular cavity is dilated.   Left Atrium: Left atrial size is dilated.  Small, hypoplastic LAA.   Left Ventricle: The ventricular cavity size is dilated. There are no stigmata of prior infarction. There is no abnormal filling defect. Mildly reduced left ventricular function, LVEF=41%.  Global hypokinesis.   Pulmonary arteries: Dilated, suggest of pulmonary hypertension. No proximal filling defect.   Pulmonary veins: Normal pulmonary venous drainage.   Pericardium: Normal thickness. Small circumferential pericardial effusion.   Mitral Valve: The mitral valve is normal structure without significant calcification.    Extra-cardiac findings: See attached radiology report for non-cardiac structures.   IMPRESSION: 1. Tricuspid aortic valve with diffuse severe calcifications. Severe aortic stenosis (calcium score 2847).   2. Annular measurements appropriate for 29 mm S3 (553 mm2).   3. Mild single calcification under the [place].   4. Sufficient coronary to annulus distance.   5. Optimal Fluoroscopic Angle for Delivery: LAO 8 CAU 9   6. Mildly reduced left ventricular function, LVEF=41%. Global hypokinesis.   7. Dilated pulmonary artery suggestive of pulmonary hypertension.   [place] T. Audie Box, MD     Electronically Signed   By: Eleonore Chiquito M.D.   On: 12/05/2021 12:31     Narrative & Impression  CLINICAL DATA:  81 year old male with history of severe aortic stenosis. Preprocedural study prior to potential transcatheter aortic valve replacement.   EXAM: CT ANGIOGRAPHY CHEST, ABDOMEN AND PELVIS   TECHNIQUE: Non-contrast CT of the chest was initially obtained.   Multidetector CT imaging through the chest, abdomen and pelvis was performed using the standard protocol during bolus administration of intravenous contrast. Multiplanar reconstructed images and MIPs were obtained and reviewed to evaluate the vascular anatomy.   RADIATION DOSE REDUCTION: This exam was performed according to the departmental dose-optimization program which includes automated exposure control, adjustment of the mA and/or kV according to patient size and/or use of iterative reconstruction technique.   CONTRAST:  27mL OMNIPAQUE IOHEXOL 350 MG/ML SOLN   COMPARISON:  CTA of the chest, abdomen and pelvis 10/24/2018. CT of the abdomen and pelvis 10/30/2018.   FINDINGS: CTA CHEST FINDINGS   Cardiovascular: Heart size is mildly enlarged. Small amount of pericardial fluid and/or thickening, unlikely to be of hemodynamic significance at this time. No pericardial calcification. There is aortic atherosclerosis, as  well as atherosclerosis of the great vessels of the mediastinum and the coronary arteries, including calcified atherosclerotic plaque in the left main, left anterior descending, left circumflex and right coronary arteries. Severe thickening and calcification of the aortic valve.   Mediastinum/Lymph Nodes: Multiple prominent borderline enlarged mediastinal and hilar lymph nodes are noted, but nonspecific. No definite pathologic lymphadenopathy identified in mediastinal or hilar nodal stations. Esophagus is unremarkable in appearance. No axillary lymphadenopathy.   Lungs/Pleura: Moderate right and small left pleural effusions lying dependently. Areas of passive subsegmental atelectasis are noted in the lower lobes of the lungs bilaterally. Mild patchy areas of ground-glass attenuation and interlobular septal thickening are noted throughout the lungs bilaterally, suggesting a background of mild interstitial pulmonary edema. No acute consolidative airspace disease. No suspicious appearing pulmonary nodules or masses are noted.   Musculoskeletal/Soft Tissues: There are no aggressive appearing lytic or blastic lesions noted in the visualized portions of the skeleton.   CTA ABDOMEN AND PELVIS FINDINGS   Hepatobiliary: Liver has a slightly shrunken appearance and nodular contour, suggesting underlying cirrhosis. No suspicious cystic or solid hepatic lesions. No intra or extrahepatic biliary ductal dilatation. Gallbladder is normal in appearance.   Pancreas: No pancreatic mass. No pancreatic ductal dilatation. No pancreatic or peripancreatic fluid collections or inflammatory changes.   Spleen: Unremarkable.   Adrenals/Urinary Tract: Multiple low-attenuation lesions in both kidneys, compatible with simple cysts, largest of which is exophytic extending from the posterior aspect of the  upper pole of the left kidney measuring 8.1 x 5.5 cm. Several other subcentimeter low-attenuation  lesions in both kidneys are too small to definitively characterize, but are favored to represent tiny cysts. No hydroureteronephrosis. Urinary bladder is normal in appearance. Bilateral adrenal glands are normal in appearance.   Stomach/Bowel: The appearance of the stomach is normal. There is no pathologic dilatation of small bowel or colon. Normal appendix.   Vascular/Lymphatic: Aortic atherosclerosis, without evidence of aneurysm or dissection in the abdominal or pelvic vasculature. Vascular findings and measurements pertinent to potential TAVR procedure, as detailed below. No lymphadenopathy noted in the abdomen or pelvis.   Reproductive: Prostate gland and seminal vesicles are unremarkable in appearance.   Other: Small volume of ascites.  No pneumoperitoneum.   Musculoskeletal: There are no aggressive appearing lytic or blastic lesions noted in the visualized portions of the skeleton.   VASCULAR MEASUREMENTS PERTINENT TO TAVR:   AORTA:   Minimal Aortic Diameter-14 x 12 mm   Severity of Aortic Calcification-severe   RIGHT PELVIS:   Right Common Iliac Artery -   Minimal Diameter-8.7 x 5.8 mm   Tortuosity-mild   Calcification-severe   Right External Iliac Artery -   Minimal Diameter-7.0 x 4.2 mm   Tortuosity-moderate   Calcification-severe   Right Common Femoral Artery -   Minimal Diameter-5.1 x 4.1 mm   Tortuosity-mild   Calcification-severe   LEFT PELVIS:   Left Common Iliac Artery -   Minimal Diameter-6.8 x 2.1 mm   Tortuosity-mild   Calcification-severe   Left External Iliac Artery -   Minimal Diameter-7.6 x 4.7 mm   Tortuosity-moderate   Calcification-moderate   Left Common Femoral Artery -   Minimal Diameter-4.3 x 2.7 mm   Tortuosity-mild   Calcification-severe   Review of the MIP images confirms the above findings.   IMPRESSION: 1. Vascular findings and measurements pertinent to potential TAVR procedure, as detailed  above. 2. Severe thickening and calcification of the aortic valve, compatible with reported clinical history of severe aortic stenosis. 3. Mild cardiomegaly with moderate right and small left pleural effusions, as well as evidence of interstitial pulmonary edema in the lungs; imaging findings concerning for congestive heart failure. 4. Small amount of pericardial fluid and/or thickening. No pericardial calcification. 5. Aortic atherosclerosis, in addition to left main and three-vessel coronary artery disease. 6. 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 7. Small volume of ascites. 8. Additional incidental findings, as above.     Electronically Signed   By: Vinnie Langton M.D.   On: 12/05/2021 12:17        Impression:   This 81 year old gentleman has stage D2, severe aortic stenosis with minimal symptoms (NYHA class I) but has had a progressive decline in left ventricular systolic function with his ejection fraction decreasing from 55 to 60% in January 2022 to 40% on his most recent echocardiogram.  In addition his Pro-BNP is markedly elevated at 1918 consistent with congestive heart failure.  I have personally reviewed his 2D echocardiogram, cardiac catheterization, and CTA studies.  His echocardiogram shows a severely calcified and thickened aortic valve with restricted mobility.  The mean gradient is 30 mmHg with a dimensionless index of 0.25 and a valve area of 0.99 cm consistent with severe aortic stenosis.  Left ventricular ejection fraction is 40% with global hypokinesis and mild left ventricular dilation.  Cardiac catheterization shows no significant coronary disease.  I agree that aortic valve replacement is indicated in this patient to prevent progressive left  ventricular deterioration.  Given his age I think transcatheter aortic valve replacement would be the best treatment for him.  His gated cardiac CTA shows anatomy suitable for TAVR using a SAPIEN 3 valve.   His abdominal and pelvic CTA shows heavily calcified femoral and iliac arteries bilaterally.  I do not think his femoral access is adequate and therefore we will plan on using his left subclavian artery which has minimal disease and is a large vessel.   The patient and his sister-in-law were counseled at length regarding treatment alternatives for management of severe symptomatic aortic stenosis. The risks and benefits of surgical intervention has been discussed in detail. Long-term prognosis with medical therapy was discussed. Alternative approaches such as conventional surgical aortic valve replacement, transcatheter aortic valve replacement, and palliative medical therapy were compared and contrasted at length. This discussion was placed in the context of the patient's own specific clinical presentation and past medical history. All of their questions have been addressed.    Following the decision to proceed with transcatheter aortic valve replacement, a discussion was held regarding what types of management strategies would be attempted intraoperatively in the event of life-threatening complications, including whether or not the patient would be considered a candidate for the use of cardiopulmonary bypass and/or conversion to open sternotomy for attempted surgical intervention.  I do not think he is a candidate for emergent sternotomy to manage any intraoperative complications given his advanced age and diffuse calcification of the ascending aorta.  The patient is aware of the fact that transient use of cardiopulmonary bypass may be necessary. The patient has been advised of a variety of complications that might develop including but not limited to risks of death, stroke, paravalvular leak, aortic dissection or other major vascular complications, aortic annulus rupture, device embolization, cardiac rupture or perforation, mitral regurgitation, acute myocardial infarction, arrhythmia, heart block or  bradycardia requiring permanent pacemaker placement, congestive heart failure, respiratory failure, renal failure, pneumonia, infection, other late complications related to structural valve deterioration or migration, or other complications that might ultimately cause a temporary or permanent loss of functional independence or other long term morbidity. The patient provides full informed consent for the procedure as described and all questions were answered.       Plan:   Left subclavian artery TAVR using a SAPIEN 3 valve.        Gaye Pollack, MD

## 2021-12-24 ENCOUNTER — Inpatient Hospital Stay (HOSPITAL_COMMUNITY): Payer: Medicare Other | Admitting: Vascular Surgery

## 2021-12-24 ENCOUNTER — Other Ambulatory Visit: Payer: Self-pay

## 2021-12-24 ENCOUNTER — Inpatient Hospital Stay (HOSPITAL_COMMUNITY)
Admission: RE | Admit: 2021-12-24 | Discharge: 2021-12-24 | Disposition: A | Discharge status: Home / Self Care | Payer: Medicare Other | Source: Home / Self Care | Attending: Physician Assistant | Admitting: Physician Assistant

## 2021-12-24 ENCOUNTER — Inpatient Hospital Stay (HOSPITAL_COMMUNITY)
Admission: RE | Admit: 2021-12-24 | Discharge: 2021-12-25 | DRG: 266 | Disposition: A | Discharge status: Home / Self Care | Payer: Medicare Other | Attending: Cardiovascular Disease | Admitting: Cardiovascular Disease

## 2021-12-24 ENCOUNTER — Other Ambulatory Visit: Payer: Self-pay | Admitting: Cardiology

## 2021-12-24 ENCOUNTER — Encounter (HOSPITAL_COMMUNITY): Payer: Self-pay | Admitting: Cardiovascular Disease

## 2021-12-24 ENCOUNTER — Inpatient Hospital Stay (HOSPITAL_COMMUNITY): Payer: Medicare Other | Admitting: General Practice

## 2021-12-24 ENCOUNTER — Encounter (HOSPITAL_COMMUNITY)
Admission: RE | Disposition: A | Discharge status: Home / Self Care | Payer: Medicare Other | Source: Home / Self Care | Attending: Cardiovascular Disease

## 2021-12-24 DIAGNOSIS — Z7982 Long term (current) use of aspirin: Secondary | ICD-10-CM

## 2021-12-24 DIAGNOSIS — R799 Abnormal finding of blood chemistry, unspecified: Secondary | ICD-10-CM

## 2021-12-24 DIAGNOSIS — I5023 Acute on chronic systolic (congestive) heart failure: Secondary | ICD-10-CM | POA: Diagnosis not present

## 2021-12-24 DIAGNOSIS — E1122 Type 2 diabetes mellitus with diabetic chronic kidney disease: Secondary | ICD-10-CM | POA: Diagnosis present

## 2021-12-24 DIAGNOSIS — F419 Anxiety disorder, unspecified: Secondary | ICD-10-CM | POA: Diagnosis not present

## 2021-12-24 DIAGNOSIS — I10 Essential (primary) hypertension: Secondary | ICD-10-CM

## 2021-12-24 DIAGNOSIS — K219 Gastro-esophageal reflux disease without esophagitis: Secondary | ICD-10-CM | POA: Diagnosis present

## 2021-12-24 DIAGNOSIS — I428 Other cardiomyopathies: Secondary | ICD-10-CM

## 2021-12-24 DIAGNOSIS — Z006 Encounter for examination for normal comparison and control in clinical research program: Secondary | ICD-10-CM

## 2021-12-24 DIAGNOSIS — Z8711 Personal history of peptic ulcer disease: Secondary | ICD-10-CM

## 2021-12-24 DIAGNOSIS — I350 Nonrheumatic aortic (valve) stenosis: Principal | ICD-10-CM

## 2021-12-24 DIAGNOSIS — I110 Hypertensive heart disease with heart failure: Secondary | ICD-10-CM | POA: Diagnosis present

## 2021-12-24 DIAGNOSIS — E785 Hyperlipidemia, unspecified: Secondary | ICD-10-CM | POA: Diagnosis present

## 2021-12-24 DIAGNOSIS — Z79899 Other long term (current) drug therapy: Secondary | ICD-10-CM

## 2021-12-24 DIAGNOSIS — M19039 Primary osteoarthritis, unspecified wrist: Secondary | ICD-10-CM | POA: Diagnosis present

## 2021-12-24 DIAGNOSIS — Z952 Presence of prosthetic heart valve: Secondary | ICD-10-CM | POA: Diagnosis not present

## 2021-12-24 DIAGNOSIS — F411 Generalized anxiety disorder: Secondary | ICD-10-CM | POA: Diagnosis present

## 2021-12-24 DIAGNOSIS — Z87891 Personal history of nicotine dependence: Secondary | ICD-10-CM

## 2021-12-24 DIAGNOSIS — I5043 Acute on chronic combined systolic (congestive) and diastolic (congestive) heart failure: Secondary | ICD-10-CM

## 2021-12-24 DIAGNOSIS — Z7984 Long term (current) use of oral hypoglycemic drugs: Secondary | ICD-10-CM | POA: Diagnosis not present

## 2021-12-24 DIAGNOSIS — E119 Type 2 diabetes mellitus without complications: Secondary | ICD-10-CM

## 2021-12-24 DIAGNOSIS — H9190 Unspecified hearing loss, unspecified ear: Secondary | ICD-10-CM | POA: Diagnosis present

## 2021-12-24 DIAGNOSIS — K7460 Unspecified cirrhosis of liver: Secondary | ICD-10-CM | POA: Diagnosis present

## 2021-12-24 DIAGNOSIS — Z96651 Presence of right artificial knee joint: Secondary | ICD-10-CM | POA: Diagnosis present

## 2021-12-24 DIAGNOSIS — Z8249 Family history of ischemic heart disease and other diseases of the circulatory system: Secondary | ICD-10-CM | POA: Diagnosis not present

## 2021-12-24 DIAGNOSIS — E876 Hypokalemia: Secondary | ICD-10-CM

## 2021-12-24 DIAGNOSIS — E109 Type 1 diabetes mellitus without complications: Secondary | ICD-10-CM

## 2021-12-24 DIAGNOSIS — E118 Type 2 diabetes mellitus with unspecified complications: Secondary | ICD-10-CM

## 2021-12-24 HISTORY — DX: Presence of prosthetic heart valve: Z95.2

## 2021-12-24 HISTORY — PX: TEE WITHOUT CARDIOVERSION: SHX5443

## 2021-12-24 LAB — POCT I-STAT, CHEM 8
BUN: 25 mg/dL — ABNORMAL HIGH (ref 8–23)
BUN: 23 mg/dL (ref 8–23)
BUN: 24 mg/dL — ABNORMAL HIGH (ref 8–23)
BUN: 23 mg/dL (ref 8–23)
Calcium, Ion: 1.22 mmol/L (ref 1.15–1.40)
Calcium, Ion: 1.23 mmol/L (ref 1.15–1.40)
Calcium, Ion: 1.23 mmol/L (ref 1.15–1.40)
Calcium, Ion: 1.19 mmol/L (ref 1.15–1.40)
Chloride: 105 mmol/L (ref 98–111)
Chloride: 103 mmol/L (ref 98–111)
Chloride: 104 mmol/L (ref 98–111)
Chloride: 105 mmol/L (ref 98–111)
Creatinine, Ser: 0.8 mg/dL (ref 0.61–1.24)
Creatinine, Ser: 0.8 mg/dL (ref 0.61–1.24)
Creatinine, Ser: 0.8 mg/dL (ref 0.61–1.24)
Creatinine, Ser: 0.8 mg/dL (ref 0.61–1.24)
Glucose, Bld: 146 mg/dL — ABNORMAL HIGH (ref 70–99)
Glucose, Bld: 155 mg/dL — ABNORMAL HIGH (ref 70–99)
Glucose, Bld: 161 mg/dL — ABNORMAL HIGH (ref 70–99)
Glucose, Bld: 157 mg/dL — ABNORMAL HIGH (ref 70–99)
HCT: 34 % — ABNORMAL LOW (ref 39.0–52.0)
HCT: 30 % — ABNORMAL LOW (ref 39.0–52.0)
HCT: 32 % — ABNORMAL LOW (ref 39.0–52.0)
HCT: 36 % — ABNORMAL LOW (ref 39.0–52.0)
Hemoglobin: 11.6 g/dL — ABNORMAL LOW (ref 13.0–17.0)
Hemoglobin: 10.2 g/dL — ABNORMAL LOW (ref 13.0–17.0)
Hemoglobin: 10.9 g/dL — ABNORMAL LOW (ref 13.0–17.0)
Hemoglobin: 12.2 g/dL — ABNORMAL LOW (ref 13.0–17.0)
Potassium: 3.1 mmol/L — ABNORMAL LOW (ref 3.5–5.1)
Potassium: 2.9 mmol/L — ABNORMAL LOW (ref 3.5–5.1)
Potassium: 3 mmol/L — ABNORMAL LOW (ref 3.5–5.1)
Potassium: 3.4 mmol/L — ABNORMAL LOW (ref 3.5–5.1)
Sodium: 141 mmol/L (ref 135–145)
Sodium: 141 mmol/L (ref 135–145)
Sodium: 142 mmol/L (ref 135–145)
Sodium: 141 mmol/L (ref 135–145)
TCO2: 26 mmol/L (ref 22–32)
TCO2: 28 mmol/L (ref 22–32)
TCO2: 28 mmol/L (ref 22–32)
TCO2: 24 mmol/L (ref 22–32)

## 2021-12-24 LAB — GLUCOSE, CAPILLARY
Glucose-Capillary: 149 mg/dL — ABNORMAL HIGH (ref 70–99)
Glucose-Capillary: 154 mg/dL — ABNORMAL HIGH (ref 70–99)
Glucose-Capillary: 154 mg/dL — ABNORMAL HIGH (ref 70–99)
Glucose-Capillary: 156 mg/dL — ABNORMAL HIGH (ref 70–99)
Glucose-Capillary: 131 mg/dL — ABNORMAL HIGH (ref 70–99)

## 2021-12-24 LAB — ECHO TEE
AR max vel: 3.51 cm2
AV Area VTI: 3.28 cm2
AV Area mean vel: 1.57 cm2
AV Mean grad: 2 mmHg
AV Peak grad: 4.8 mmHg
Ao pk vel: 1.1 m/s

## 2021-12-24 SURGERY — IMPLANTATION, AORTIC VALVE, TRANSCATHETER, SUBCLAVIAN ARTERY APPROACH
Anesthesia: General | Site: Chest

## 2021-12-24 MED ORDER — ALLOPURINOL 300 MG PO TABS
300.0000 mg | ORAL_TABLET | Freq: Every day | ORAL | Status: DC
  Administered 2021-12-24 – 2021-12-25 (×2): 300 mg via ORAL
  Filled 2021-12-24 (×2): qty 1

## 2021-12-24 MED ORDER — HYDRALAZINE HCL 20 MG/ML IJ SOLN
INTRAMUSCULAR | Status: DC | PRN
Start: 2021-12-24 — End: 2021-12-24
  Administered 2021-12-24: 5 mg via INTRAVENOUS

## 2021-12-24 MED ORDER — CLONIDINE HCL 0.2 MG PO TABS
0.6000 mg | ORAL_TABLET | Freq: Two times a day (BID) | ORAL | Status: DC
  Administered 2021-12-24 – 2021-12-25 (×3): 0.6 mg via ORAL
  Filled 2021-12-24: qty 2
  Filled 2021-12-24: qty 3
  Filled 2021-12-24: qty 2
  Filled 2021-12-24: qty 3

## 2021-12-24 MED ORDER — HEPARIN 6000 UNIT IRRIGATION SOLUTION
Status: AC
  Filled 2021-12-24: qty 1500

## 2021-12-24 MED ORDER — INSULIN ASPART 100 UNIT/ML IJ SOLN: 0.0000 [IU] | INTRAMUSCULAR | Status: DC | PRN

## 2021-12-24 MED ORDER — MORPHINE SULFATE (PF) 2 MG/ML IV SOLN: 1.0000 mg | INTRAVENOUS | Status: DC | PRN

## 2021-12-24 MED ORDER — OXYCODONE HCL 5 MG PO TABS
5.0000 mg | ORAL_TABLET | ORAL | Status: DC | PRN
  Administered 2021-12-25: 5 mg via ORAL
  Filled 2021-12-24: qty 1

## 2021-12-24 MED ORDER — ONDANSETRON HCL 4 MG/2ML IJ SOLN: 4.0000 mg | Freq: Four times a day (QID) | INTRAMUSCULAR | Status: DC | PRN

## 2021-12-24 MED ORDER — ACETAMINOPHEN 650 MG RE SUPP: 650.0000 mg | Freq: Four times a day (QID) | RECTAL | Status: DC | PRN

## 2021-12-24 MED ORDER — 0.9 % SODIUM CHLORIDE (POUR BTL) OPTIME
TOPICAL | Status: DC | PRN
  Administered 2021-12-24: 2000 mL

## 2021-12-24 MED ORDER — ROCURONIUM BROMIDE 10 MG/ML (PF) SYRINGE
PREFILLED_SYRINGE | INTRAVENOUS | Status: DC | PRN
  Administered 2021-12-24: 60 mg via INTRAVENOUS
  Administered 2021-12-24: 20 mg via INTRAVENOUS

## 2021-12-24 MED ORDER — FENTANYL CITRATE (PF) 250 MCG/5ML IJ SOLN
INTRAMUSCULAR | Status: AC
  Filled 2021-12-24: qty 5

## 2021-12-24 MED ORDER — SODIUM CHLORIDE 0.9 % IV SOLN: INTRAVENOUS | Status: AC

## 2021-12-24 MED ORDER — LIDOCAINE HCL 1 % IJ SOLN
INTRAMUSCULAR | Status: AC
  Filled 2021-12-24: qty 20

## 2021-12-24 MED ORDER — HYDRALAZINE HCL 20 MG/ML IJ SOLN
INTRAMUSCULAR | Status: AC
  Filled 2021-12-24: qty 1

## 2021-12-24 MED ORDER — PROTAMINE SULFATE 10 MG/ML IV SOLN
INTRAVENOUS | Status: DC | PRN
  Administered 2021-12-24: 90 mg via INTRAVENOUS

## 2021-12-24 MED ORDER — CHLORHEXIDINE GLUCONATE 4 % EX LIQD: 60.0000 mL | Freq: Once | CUTANEOUS | Status: DC

## 2021-12-24 MED ORDER — SODIUM CHLORIDE 0.9 % IV SOLN: INTRAVENOUS | Status: DC

## 2021-12-24 MED ORDER — SODIUM CHLORIDE 0.9% FLUSH: 3.0000 mL | INTRAVENOUS | Status: DC | PRN

## 2021-12-24 MED ORDER — LABETALOL HCL 5 MG/ML IV SOLN
INTRAVENOUS | Status: AC
  Filled 2021-12-24: qty 4

## 2021-12-24 MED ORDER — NITROGLYCERIN IN D5W 200-5 MCG/ML-% IV SOLN
INTRAVENOUS | Status: AC
  Filled 2021-12-24: qty 250

## 2021-12-24 MED ORDER — DEXAMETHASONE SODIUM PHOSPHATE 10 MG/ML IJ SOLN
INTRAMUSCULAR | Status: DC | PRN
  Administered 2021-12-24: 5 mg via INTRAVENOUS

## 2021-12-24 MED ORDER — LOSARTAN POTASSIUM 25 MG PO TABS
25.0000 mg | ORAL_TABLET | Freq: Every day | ORAL | Status: DC
Start: 2021-12-24 — End: 2021-12-24

## 2021-12-24 MED ORDER — LABETALOL HCL 5 MG/ML IV SOLN
10.0000 mg | Freq: Once | INTRAVENOUS | Status: AC
  Administered 2021-12-24: 10 mg via INTRAVENOUS

## 2021-12-24 MED ORDER — ONDANSETRON HCL 4 MG/2ML IJ SOLN
INTRAMUSCULAR | Status: DC | PRN
  Administered 2021-12-24: 4 mg via INTRAVENOUS

## 2021-12-24 MED ORDER — LACTATED RINGERS IV SOLN: INTRAVENOUS | Status: DC | PRN

## 2021-12-24 MED ORDER — HYDRALAZINE HCL 20 MG/ML IJ SOLN
10.0000 mg | INTRAMUSCULAR | Status: DC | PRN
  Administered 2021-12-24: 10 mg via INTRAVENOUS

## 2021-12-24 MED ORDER — LIDOCAINE 2% (20 MG/ML) 5 ML SYRINGE
INTRAMUSCULAR | Status: DC | PRN
Start: 2021-12-24 — End: 2021-12-24
  Administered 2021-12-24: 60 mg via INTRAVENOUS

## 2021-12-24 MED ORDER — NITROGLYCERIN IN D5W 200-5 MCG/ML-% IV SOLN
0.0000 ug/min | INTRAVENOUS | Status: DC
  Administered 2021-12-24: 20 ug/min via INTRAVENOUS
  Filled 2021-12-24: qty 250

## 2021-12-24 MED ORDER — HYDRALAZINE HCL 25 MG PO TABS: 25.0000 mg | ORAL_TABLET | Freq: Four times a day (QID) | ORAL | Status: DC | PRN

## 2021-12-24 MED ORDER — CHLORHEXIDINE GLUCONATE 4 % EX LIQD: 30.0000 mL | CUTANEOUS | Status: DC

## 2021-12-24 MED ORDER — SACUBITRIL-VALSARTAN 97-103 MG PO TABS
1.0000 | ORAL_TABLET | Freq: Two times a day (BID) | ORAL | Status: DC
  Administered 2021-12-24 – 2021-12-25 (×2): 1 via ORAL
  Filled 2021-12-24 (×3): qty 1

## 2021-12-24 MED ORDER — SODIUM CHLORIDE 0.9 % IV SOLN: 250.0000 mL | INTRAVENOUS | Status: DC | PRN

## 2021-12-24 MED ORDER — FUROSEMIDE 10 MG/ML IJ SOLN
40.0000 mg | Freq: Once | INTRAMUSCULAR | Status: AC
  Administered 2021-12-24: 40 mg via INTRAVENOUS

## 2021-12-24 MED ORDER — FENTANYL CITRATE (PF) 250 MCG/5ML IJ SOLN
INTRAMUSCULAR | Status: DC | PRN
  Administered 2021-12-24 (×3): 50 ug via INTRAVENOUS

## 2021-12-24 MED ORDER — SODIUM CHLORIDE 0.9% FLUSH
3.0000 mL | Freq: Two times a day (BID) | INTRAVENOUS | Status: DC
  Administered 2021-12-25: 3 mL via INTRAVENOUS

## 2021-12-24 MED ORDER — IODIXANOL 320 MG/ML IV SOLN
INTRAVENOUS | Status: DC | PRN
  Administered 2021-12-24: 100 mL

## 2021-12-24 MED ORDER — ACETAMINOPHEN 325 MG PO TABS
650.0000 mg | ORAL_TABLET | Freq: Four times a day (QID) | ORAL | Status: DC | PRN
  Administered 2021-12-24: 650 mg via ORAL
  Filled 2021-12-24: qty 2

## 2021-12-24 MED ORDER — PROPOFOL 10 MG/ML IV BOLUS
INTRAVENOUS | Status: AC
  Filled 2021-12-24: qty 20

## 2021-12-24 MED ORDER — PANTOPRAZOLE SODIUM 40 MG PO TBEC
40.0000 mg | DELAYED_RELEASE_TABLET | Freq: Every day | ORAL | Status: DC
  Administered 2021-12-24 – 2021-12-25 (×2): 40 mg via ORAL
  Filled 2021-12-24 (×2): qty 1

## 2021-12-24 MED ORDER — SODIUM CHLORIDE 0.9 % IV SOLN: 250.0000 mL | INTRAVENOUS | Status: DC

## 2021-12-24 MED ORDER — EPHEDRINE SULFATE-NACL 50-0.9 MG/10ML-% IV SOSY
PREFILLED_SYRINGE | INTRAVENOUS | Status: DC | PRN
Start: 2021-12-24 — End: 2021-12-24
  Administered 2021-12-24 (×2): 2.5 mg via INTRAVENOUS

## 2021-12-24 MED ORDER — LABETALOL HCL 5 MG/ML IV SOLN
10.0000 mg | INTRAVENOUS | Status: DC | PRN
  Administered 2021-12-24 (×2): 10 mg via INTRAVENOUS

## 2021-12-24 MED ORDER — CHLORHEXIDINE GLUCONATE 0.12 % MT SOLN
15.0000 mL | Freq: Once | OROMUCOSAL | Status: AC
  Administered 2021-12-24 (×2): 15 mL via OROMUCOSAL
  Filled 2021-12-24: qty 15

## 2021-12-24 MED ORDER — PHENYLEPHRINE 40 MCG/ML (10ML) SYRINGE FOR IV PUSH (FOR BLOOD PRESSURE SUPPORT)
PREFILLED_SYRINGE | INTRAVENOUS | Status: DC | PRN
  Administered 2021-12-24: 80 ug via INTRAVENOUS

## 2021-12-24 MED ORDER — TRAMADOL HCL 50 MG PO TABS: 50.0000 mg | ORAL_TABLET | ORAL | Status: DC | PRN

## 2021-12-24 MED ORDER — CEFAZOLIN SODIUM-DEXTROSE 2-4 GM/100ML-% IV SOLN
2.0000 g | Freq: Three times a day (TID) | INTRAVENOUS | Status: AC
  Administered 2021-12-24 – 2021-12-25 (×2): 2 g via INTRAVENOUS
  Filled 2021-12-24 (×2): qty 100

## 2021-12-24 MED ORDER — FUROSEMIDE 10 MG/ML IJ SOLN
INTRAMUSCULAR | Status: AC
  Filled 2021-12-24: qty 4

## 2021-12-24 MED ORDER — ROCURONIUM BROMIDE 10 MG/ML (PF) SYRINGE
PREFILLED_SYRINGE | INTRAVENOUS | Status: AC
  Filled 2021-12-24: qty 10

## 2021-12-24 MED ORDER — HEPARIN 6000 UNIT IRRIGATION SOLUTION
Status: DC | PRN
  Administered 2021-12-24: 3

## 2021-12-24 MED ORDER — ASPIRIN EC 81 MG PO TBEC
81.0000 mg | DELAYED_RELEASE_TABLET | Freq: Every day | ORAL | Status: DC
  Administered 2021-12-25: 81 mg via ORAL
  Filled 2021-12-24: qty 1

## 2021-12-24 MED ORDER — HEPARIN SODIUM (PORCINE) 1000 UNIT/ML IJ SOLN
INTRAMUSCULAR | Status: DC | PRN
Start: 2021-12-24 — End: 2021-12-24
  Administered 2021-12-24: 9000 [IU] via INTRAVENOUS

## 2021-12-24 MED ORDER — POTASSIUM CHLORIDE 10 MEQ/100ML IV SOLN
10.0000 meq | INTRAVENOUS | Status: AC
  Administered 2021-12-24 (×2): 10 meq via INTRAVENOUS
  Filled 2021-12-24: qty 100

## 2021-12-24 MED ORDER — INSULIN ASPART 100 UNIT/ML IJ SOLN
0.0000 [IU] | INTRAMUSCULAR | Status: DC
  Administered 2021-12-24 – 2021-12-25 (×3): 2 [IU] via SUBCUTANEOUS

## 2021-12-24 MED ORDER — LIDOCAINE 2% (20 MG/ML) 5 ML SYRINGE
INTRAMUSCULAR | Status: AC
  Filled 2021-12-24: qty 5

## 2021-12-24 MED ORDER — PROPOFOL 10 MG/ML IV BOLUS
INTRAVENOUS | Status: DC | PRN
  Administered 2021-12-24: 150 mg via INTRAVENOUS
  Administered 2021-12-24: 20 mg via INTRAVENOUS
  Administered 2021-12-24: 30 mg via INTRAVENOUS
  Administered 2021-12-24: 50 mg via INTRAVENOUS

## 2021-12-24 MED ORDER — AMLODIPINE BESYLATE 5 MG PO TABS
5.0000 mg | ORAL_TABLET | Freq: Every day | ORAL | Status: DC
  Administered 2021-12-24 – 2021-12-25 (×2): 5 mg via ORAL
  Filled 2021-12-24 (×2): qty 1

## 2021-12-24 SURGICAL SUPPLY — 94 items
BAG COUNTER SPONGE SURGICOUNT (BAG) ×3 IMPLANT
BAG DECANTER FOR FLEXI CONT (MISCELLANEOUS) ×1 IMPLANT
BAG SNAP BAND KOVER 36X36 (MISCELLANEOUS) ×3 IMPLANT
BLADE CLIPPER SURG (BLADE) ×1 IMPLANT
BLADE OSCILLATING /SAGITTAL (BLADE) IMPLANT
BLADE STERNUM SYSTEM 6 (BLADE) IMPLANT
CABLE ADAPT CONN TEMP 6FT (ADAPTER) ×3 IMPLANT
CANNULA FEM VENOUS REMOTE 22FR (CANNULA) IMPLANT
CANNULA OPTISITE PERFUSION 16F (CANNULA) IMPLANT
CANNULA OPTISITE PERFUSION 18F (CANNULA) IMPLANT
CATH DIAG EXPO 6F AL1 (CATHETERS) IMPLANT
CATH DIAG EXPO 6F VENT PIG 145 (CATHETERS) ×6 IMPLANT
CATH INFINITI 6F AL2 (CATHETERS) IMPLANT
CATH S G BIP PACING (CATHETERS) ×3 IMPLANT
CLIP VESOCCLUDE MED 24/CT (CLIP) ×1 IMPLANT
CLIP VESOCCLUDE SM WIDE 24/CT (CLIP) ×1 IMPLANT
CLOSURE MYNX CONTROL 6F/7F (Vascular Products) ×1 IMPLANT
CNTNR URN SCR LID CUP LEK RST (MISCELLANEOUS) ×4 IMPLANT
CONT SPEC 4OZ STRL OR WHT (MISCELLANEOUS) ×12
COVER BACK TABLE 24X17X13 BIG (DRAPES) IMPLANT
COVER BACK TABLE 80X110 HD (DRAPES) ×5 IMPLANT
COVER DOME SNAP 22 D (MISCELLANEOUS) IMPLANT
DERMABOND ADHESIVE PROPEN (GAUZE/BANDAGES/DRESSINGS) ×2
DERMABOND ADVANCED (GAUZE/BANDAGES/DRESSINGS) ×1
DERMABOND ADVANCED .7 DNX12 (GAUZE/BANDAGES/DRESSINGS) ×2 IMPLANT
DERMABOND ADVANCED .7 DNX6 (GAUZE/BANDAGES/DRESSINGS) IMPLANT
DEVICE CLOSURE PERCLS PRGLD 6F (VASCULAR PRODUCTS) ×4 IMPLANT
DRAPE INCISE IOBAN 66X45 STRL (DRAPES) ×1 IMPLANT
DRSG TEGADERM 4X4.5 CHG (GAUZE/BANDAGES/DRESSINGS) ×1 IMPLANT
DRSG TEGADERM 4X4.75 (GAUZE/BANDAGES/DRESSINGS) ×6 IMPLANT
ELECT CAUTERY BLADE 6.4 (BLADE) IMPLANT
ELECT REM PT RETURN 9FT ADLT (ELECTROSURGICAL) ×3
ELECTRODE REM PT RTRN 9FT ADLT (ELECTROSURGICAL) ×4 IMPLANT
FELT TEFLON 6X6 (MISCELLANEOUS) IMPLANT
FEMORAL VENOUS CANN RAP (CANNULA) IMPLANT
GAUZE SPONGE 4X4 12PLY STRL (GAUZE/BANDAGES/DRESSINGS) ×3 IMPLANT
GLOVE EUDERMIC 7 POWDERFREE (GLOVE) IMPLANT
GLOVE SURG ENC MOIS LTX SZ7.5 (GLOVE) ×1 IMPLANT
GLOVE SURG ENC MOIS LTX SZ8 (GLOVE) ×1 IMPLANT
GLOVE SURG MICRO LTX SZ7 (GLOVE) ×2 IMPLANT
GLOVE SURG ORTHO LTX SZ7.5 (GLOVE) IMPLANT
GOWN STRL REUS W/ TWL LRG LVL3 (GOWN DISPOSABLE) IMPLANT
GOWN STRL REUS W/ TWL XL LVL3 (GOWN DISPOSABLE) ×2 IMPLANT
GOWN STRL REUS W/TWL LRG LVL3 (GOWN DISPOSABLE) ×3
GOWN STRL REUS W/TWL XL LVL3 (GOWN DISPOSABLE) ×12
GUIDEWIRE SAFE TJ AMPLATZ EXST (WIRE) ×3 IMPLANT
INSERT FOGARTY SM (MISCELLANEOUS) IMPLANT
KIT BASIN OR (CUSTOM PROCEDURE TRAY) ×3 IMPLANT
KIT DILATOR VASC 18G NDL (KITS) IMPLANT
KIT HEART LEFT (KITS) ×3 IMPLANT
KIT SUCTION CATH 14FR (SUCTIONS) IMPLANT
KIT TURNOVER KIT B (KITS) ×3 IMPLANT
LOOP VESSEL MAXI BLUE (MISCELLANEOUS) IMPLANT
LOOP VESSEL MINI RED (MISCELLANEOUS) IMPLANT
NDL PERC 18GX7CM (NEEDLE) ×2 IMPLANT
NEEDLE 22X1 1/2 (OR ONLY) (NEEDLE) IMPLANT
NEEDLE PERC 18GX7CM (NEEDLE) ×3 IMPLANT
NS IRRIG 1000ML POUR BTL (IV SOLUTION) ×8 IMPLANT
PACK ENDOVASCULAR (PACKS) ×3 IMPLANT
PAD ARMBOARD 7.5X6 YLW CONV (MISCELLANEOUS) ×6 IMPLANT
PAD ELECT DEFIB RADIOL ZOLL (MISCELLANEOUS) ×3 IMPLANT
PENCIL BUTTON HOLSTER BLD 10FT (ELECTRODE) ×3 IMPLANT
PERCLOSE PROGLIDE 6F (VASCULAR PRODUCTS)
POSITIONER HEAD DONUT 9IN (MISCELLANEOUS) ×3 IMPLANT
SET MICROPUNCTURE 5F STIFF (MISCELLANEOUS) ×2 IMPLANT
SHEATH BRITE TIP 6FR 35CM (SHEATH) ×3 IMPLANT
SHEATH PINNACLE 6F 10CM (SHEATH) ×3 IMPLANT
SHEATH PINNACLE 8F 10CM (SHEATH) ×3 IMPLANT
SLEEVE REPOSITIONING LENGTH 30 (MISCELLANEOUS) ×3 IMPLANT
SPONGE GAUZE 2X2 8PLY STRL LF (GAUZE/BANDAGES/DRESSINGS) ×1 IMPLANT
SPONGE T-LAP 4X18 ~~LOC~~+RFID (SPONGE) IMPLANT
STOPCOCK MORSE 400PSI 3WAY (MISCELLANEOUS) ×7 IMPLANT
SUT ETHIBOND X763 2 0 SH 1 (SUTURE) IMPLANT
SUT GORETEX CV 4 TH 22 36 (SUTURE) ×1 IMPLANT
SUT GORETEX CV4 TH-18 (SUTURE) ×2 IMPLANT
SUT MNCRL AB 3-0 PS2 18 (SUTURE) IMPLANT
SUT PROLENE 5 0 C 1 36 (SUTURE) IMPLANT
SUT PROLENE 6 0 C 1 30 (SUTURE) IMPLANT
SUT SILK  1 MH (SUTURE)
SUT SILK 1 MH (SUTURE) ×2 IMPLANT
SUT VIC AB 2-0 CT1 27 (SUTURE) ×3
SUT VIC AB 2-0 CT1 TAPERPNT 27 (SUTURE) IMPLANT
SUT VIC AB 2-0 CTX 36 (SUTURE) IMPLANT
SUT VIC AB 3-0 SH 8-18 (SUTURE) IMPLANT
SYR 50ML LL SCALE MARK (SYRINGE) ×3 IMPLANT
SYR BULB IRRIG 60ML STRL (SYRINGE) IMPLANT
SYR CONTROL 10ML LL (SYRINGE) IMPLANT
TOWEL GREEN STERILE (TOWEL DISPOSABLE) ×3 IMPLANT
TOWEL GREEN STERILE FF (TOWEL DISPOSABLE) ×3 IMPLANT
TRANSDUCER W/STOPCOCK (MISCELLANEOUS) ×6 IMPLANT
TRAY FOLEY SLVR 16FR TEMP STAT (SET/KITS/TRAYS/PACK) IMPLANT
VALVE HEART TRANSCATH SZ3 29MM (Valve) ×1 IMPLANT
WIRE EMERALD 3MM-J .035X150CM (WIRE) ×3 IMPLANT
WIRE EMERALD 3MM-J .035X260CM (WIRE) ×3 IMPLANT

## 2021-12-24 NOTE — Interval H&P Note (Signed)
History and Physical Interval Note:  12/24/2021 6:34 AM  Nicholas Shelton  has presented today for surgery, with the diagnosis of AO.  The various methods of treatment have been discussed with the patient and family. After consideration of risks, benefits and other options for treatment, the patient has consented to  Procedure(s): Transcatheter Aortic Valve Replacement-Subclavian (N/A) TRANSESOPHAGEAL ECHOCARDIOGRAM (TEE) (N/A) as a surgical intervention.  The patient's history has been reviewed, patient examined, no change in status, stable for surgery.  I have reviewed the patient's chart and labs.  Questions were answered to the patient's satisfaction.     Gaye Pollack

## 2021-12-24 NOTE — Progress Notes (Addendum)
°  [place] VALVE TEAM  Patient doing well s/p TAVR however remains very hypertensive with SBPs >200 despite IV NTG at 53mcg. Will restart home Entresto, clonidine. May have some rebound hypertension. Groin sites stable. ECG with no high grade block. Plan to DC arterial line and transfer to 4E. Plan for early ambulation after bedrest completed and hopeful discharge over the next 24-48 hours.   Kathyrn Drown NP-C Structural Heart Team  Pager: [phone] Phone: [phone]  Addend: Looking back at prior OP office visits, it appears his baseline SBP is around the 170-190 range.

## 2021-12-24 NOTE — Progress Notes (Signed)
°     20 G, R brachial arterial line was removed and manual pressure was held for 10 min. No hematoma or bleeding were noted post removal.Sterile dressing was applied at the site. R brachial pulse was 3+

## 2021-12-24 NOTE — Transfer of Care (Signed)
Immediate Anesthesia Transfer of Care Note  Patient: Manpreet Haskew  Procedure(s) Performed: Transcatheter Aortic Valve Replacement-Subclavian (Chest) TRANSESOPHAGEAL ECHOCARDIOGRAM (TEE)  Patient Location: Cath Lab  Anesthesia Type:General  Level of Consciousness: awake and alert   Airway & Oxygen Therapy: Patient Spontanous Breathing and Patient connected to nasal cannula oxygen  Post-op Assessment: Report given to RN and Post -op Vital signs reviewed and stable  Post vital signs: Reviewed and stable  Last Vitals:  Vitals Value Taken Time  BP 202/75 12/24/21 1000  Temp    Pulse 65 12/24/21 1000  Resp    SpO2 95 % 12/24/21 1000  Vitals shown include unvalidated device data.  Last Pain:  Vitals:   12/24/21 0623  TempSrc:   PainSc: 0-No pain      Patients Stated Pain Goal: 0 (37/90/24 0973)  Complications: No notable events documented.

## 2021-12-24 NOTE — Op Note (Signed)
[facility]   MULTIDISCIPLINARY HEART VALVE TEAM   TAVR OPERATIVE NOTE   Date of Procedure:  12/24/2021  Preoperative Diagnosis: Severe Aortic Stenosis   Postoperative Diagnosis: Same   Procedure:   Transcatheter Aortic Valve Replacement - Left Subclavian Approach   Edwards Sapien 3 THV (size 29 mm, serial # [id number])   Co-Surgeons:  Gaye Pollack, MD and Sherren Mocha, MD  Anesthesiologist:  Albertha Ghee, MD  Echocardiographer:  Marry Guan, MD  Pre-operative Echo Findings: Severe aortic stenosis Normal left ventricular systolic function  Post-operative Echo Findings: Trace paravalvular leak Normal left ventricular systolic function  BRIEF CLINICAL NOTE AND INDICATIONS FOR SURGERY  81 year old gentleman with hypertension, type 2 diabetes, and aortic stenosis, presenting today for TAVR.  The patient is noted to have progressive aortic stenosis.  His LVEF has declined and was last assessed at 40%.  Mean transvalvular gradient is 30 mmHg, dimensionless index 0.25, and calculated aortic valve area 0.99 cm.  Aortic valve calcium score is 2847.  The patient underwent multidisciplinary heart team review of his case.  He is felt to be an appropriate patient for TAVR via a left subclavian approach.  Preprocedural imaging demonstrated bilateral pleural effusions.  There is also mild interstitial pulmonary edema noted.  The patient's BNP is markedly elevated.  All of these findings are consistent with acute on chronic systolic heart failure at the time of TAVR.  During the course of the patient's preoperative work up they have been evaluated comprehensively by a multidisciplinary team of specialists coordinated through the [facility] in the [facility].  They have been demonstrated to suffer from symptomatic severe aortic stenosis as noted above. The patient has been counseled extensively as to the relative risks and  benefits of all options for the treatment of severe aortic stenosis including long term medical therapy, conventional surgery for aortic valve replacement, and transcatheter aortic valve replacement.  The patient has been independently evaluated in formal cardiac surgical consultation by Dr Cyndia Bent, who deemed the patient appropriate for TAVR. Based upon review of all of the patient's preoperative diagnostic tests they are felt to be candidate for transcatheter aortic valve replacement using the left subclavian approach as an alternative to conventional surgery.    Following the decision to proceed with transcatheter aortic valve replacement, a discussion has been held regarding what types of management strategies would be attempted intraoperatively in the event of life-threatening complications, including whether or not the patient would be considered a candidate for the use of cardiopulmonary bypass and/or conversion to open sternotomy for attempted surgical intervention.  The patient has been advised of a variety of complications that might develop peculiar to this approach including but not limited to risks of death, stroke, paravalvular leak, aortic dissection or other major vascular complications, aortic annulus rupture, device embolization, cardiac rupture or perforation, acute myocardial infarction, arrhythmia, heart block or bradycardia requiring permanent pacemaker placement, congestive heart failure, respiratory failure, renal failure, pneumonia, infection, other late complications related to structural valve deterioration or migration, or other complications that might ultimately cause a temporary or permanent loss of functional independence or other long term morbidity.  The patient provides full informed consent for the procedure as described and all questions were answered preoperatively.  DETAILS OF THE OPERATIVE PROCEDURE  PREPARATION:   The patient is brought to the operating room on the  above mentioned date and central monitoring was established by the anesthesia team including placement of a  radial arterial line. The patient is placed in the supine position on the operating table.  Intravenous antibiotics are administered. General endotracheal anesthesia is induced uneventfully.    Baseline transesophageal echocardiogram is performed. The patient's chest, abdomen, both groins, and both lower extremities are prepared and draped in a sterile manner. A time out procedure is performed.   PERIPHERAL ACCESS:   Using ultrasound guidance, femoral arterial and venous access is obtained with placement of 6 Fr sheaths on the right side.  [place] images are digitally captured and stored in the patient's chart. A pigtail diagnostic catheter was passed through the femoral arterial sheath under fluoroscopic guidance into the aortic root.  A temporary transvenous pacemaker catheter was passed through the femoral venous sheath under fluoroscopic guidance into the right ventricle.  The pacemaker was tested to ensure stable lead placement and pacemaker capture. Aortic root angiography was performed in order to determine the optimal angiographic angle for valve deployment.  SUBCLAVIAN ACCESS:  Please see the complete note of Dr Cyndia Bent.  Once pursestring sutures were placed, the subclavian artery is accessed with an 18-gauge needle and J-wire.  An 8 French sheath is inserted.  An AL 2 catheter is used to direct a straight tip wire across the aortic valve and exchanged out for a pigtail catheter.  The pigtail catheter was exchanged for an Amplatz Extra-stiff wire in the LV apex.    BALLOON AORTIC VALVULOPLASTY:  Not performed  TRANSCATHETER HEART VALVE DEPLOYMENT:  An Edwards Sapien 3 transcatheter heart valve (size 29 mm) was prepared and crimped per manufacturer's guidelines, and the proper orientation of the valve is confirmed on the [employer] delivery system. The valve was advanced through the  introducer sheath using normal technique until in an appropriate position in the ascending aorta beyond the sheath tip. The balloon was then retracted and using the fine-tuning wheel was centered on the valve.  The valve was carefully positioned across the aortic valve annulus. The Commander catheter was retracted using normal technique. Once final position of the valve has been confirmed by angiographic assessment, the valve is deployed while temporarily holding ventilation and during rapid ventricular pacing to maintain systolic blood pressure < 50 mmHg and pulse pressure < 10 mmHg. The balloon inflation is held for >3 seconds after reaching full deployment volume. Once the balloon has fully deflated the balloon is retracted into the ascending aorta and valve function is assessed using echocardiography. The patient's hemodynamic recovery following valve deployment is good.  The deployment balloon and guidewire are both removed. Echo demostrated acceptable post-procedural gradients, stable mitral valve function, and trace aortic insufficiency.    PROCEDURE COMPLETION:  Please see Dr. Vivi Martens note for details of subclavian hemostasis and closure.  Protamine is administered once femoral arterial repair was complete.  The temporary pacemaker and pigtail catheters are removed. Mynx closure is used for contralateral femoral arterial hemostasis for the 6 Fr sheath.  The patient tolerated the procedure well and is transported to the recovery area in stable condition. There were no immediate intraoperative complications. All sponge instrument and needle counts are verified correct at completion of the operation.   The patient received a total of 40.6 mL of intravenous contrast during the procedure.   Sherren Mocha, MD 12/24/2021 10:17 AM

## 2021-12-24 NOTE — Anesthesia Procedure Notes (Signed)
Procedure Name: Intubation Date/Time: 12/24/2021 7:49 AM Performed by: Dorann Lodge, CRNA Pre-anesthesia Checklist: Patient identified, Emergency Drugs available, Suction available and Patient being monitored Patient Re-evaluated:Patient Re-evaluated prior to induction Oxygen Delivery Method: Circle System Utilized Preoxygenation: Pre-oxygenation with 100% oxygen Induction Type: IV induction Ventilation: Mask ventilation without difficulty Laryngoscope Size: Mac and 4 Grade View: Grade I Tube type: Oral Number of attempts: 1 Airway Equipment and Method: Stylet Placement Confirmation: ETT inserted through vocal cords under direct vision, positive ETCO2 and breath sounds checked- equal and bilateral Secured at: 23 cm Tube secured with: Tape Dental Injury: Teeth and Oropharynx as per pre-operative assessment

## 2021-12-24 NOTE — Anesthesia Procedure Notes (Signed)
Arterial Line Insertion Start/End2/14/2023 7:06 AM, 12/24/2021 7:17 AM Performed by: Albertha Ghee, MD, anesthesiologist  Patient location: Pre-op. Preanesthetic checklist: patient identified, IV checked, site marked, risks and benefits discussed, surgical consent, monitors and equipment checked, pre-op evaluation, timeout performed and anesthesia consent Lidocaine 1% used for infiltration and patient sedated Right, brachial was placed Catheter size: 20 G Hand hygiene performed  and maximum sterile barriers used   Attempts: 1 Procedure performed using ultrasound guided (no image obtained) technique. Ultrasound Notes:anatomy identified, needle tip was noted to be adjacent to the nerve/plexus identified and no ultrasound evidence of intravascular and/or intraneural injection Following insertion, dressing applied and Biopatch. Post procedure assessment: normal and unchanged  Patient tolerated the procedure well with no immediate complications.

## 2021-12-24 NOTE — Op Note (Signed)
[facility]   MULTIDISCIPLINARY HEART VALVE TEAM   TAVR OPERATIVE NOTE   Date of Procedure:  12/24/2021  Preoperative Diagnosis: Severe Aortic Stenosis   Postoperative Diagnosis: Same   Procedure:   Transcatheter Aortic Valve Replacement - Left Subclavian Artery Approach  Edwards Sapien 3  THV (size 29 mm, model # 9600TFX, serial # [id number])   Co-Surgeons:  Gaye Pollack, MD and Sherren Mocha, MD     Anesthesiologist:  Albertha Ghee, MD  Echocardiographer:  Marry Guan, MD  Pre-operative Echo Findings: Severe aortic stenosis Normal left ventricular systolic function  Post-operative Echo Findings: trace paravalvular leak Normal left ventricular systolic function   BRIEF CLINICAL NOTE AND INDICATIONS FOR SURGERY  This 81 year old gentleman has stage D2, severe aortic stenosis with minimal symptoms (NYHA class I) but has had a progressive decline in left ventricular systolic function with his ejection fraction decreasing from 55 to 60% in January 2022 to 40% on his most recent echocardiogram.  In addition his Pro-BNP is markedly elevated at 1918 consistent with congestive heart failure.  I have personally reviewed his 2D echocardiogram, cardiac catheterization, and CTA studies.  His echocardiogram shows a severely calcified and thickened aortic valve with restricted mobility.  The mean gradient is 30 mmHg with a dimensionless index of 0.25 and a valve area of 0.99 cm consistent with severe aortic stenosis.  Left ventricular ejection fraction is 40% with global hypokinesis and mild left ventricular dilation.  Cardiac catheterization shows no significant coronary disease.  I agree that aortic valve replacement is indicated in this patient to prevent progressive left ventricular deterioration.  Given his age I think transcatheter aortic valve replacement would be the best treatment for him.  His gated cardiac CTA shows anatomy suitable for TAVR using a SAPIEN 3 valve.   His abdominal and pelvic CTA shows heavily calcified femoral and iliac arteries bilaterally.  I do not think his femoral access is adequate and therefore we will plan on using his left subclavian artery which has minimal disease and is a large vessel.   The patient and his sister-in-law were counseled at length regarding treatment alternatives for management of severe symptomatic aortic stenosis. The risks and benefits of surgical intervention has been discussed in detail. Long-term prognosis with medical therapy was discussed. Alternative approaches such as conventional surgical aortic valve replacement, transcatheter aortic valve replacement, and palliative medical therapy were compared and contrasted at length. This discussion was placed in the context of the patient's own specific clinical presentation and past medical history. All of their questions have been addressed.    Following the decision to proceed with transcatheter aortic valve replacement, a discussion was held regarding what types of management strategies would be attempted intraoperatively in the event of life-threatening complications, including whether or not the patient would be considered a candidate for the use of cardiopulmonary bypass and/or conversion to open sternotomy for attempted surgical intervention.  I do not think he is a candidate for emergent sternotomy to manage any intraoperative complications given his advanced age and diffuse calcification of the ascending aorta.  The patient is aware of the fact that transient use of cardiopulmonary bypass may be necessary. The patient has been advised of a variety of complications that might develop including but not limited to risks of death, stroke, paravalvular leak, aortic dissection or other major vascular complications, aortic annulus rupture, device embolization, cardiac rupture or perforation, mitral regurgitation, acute myocardial infarction, arrhythmia, heart block or  bradycardia requiring permanent pacemaker placement,  congestive heart failure, respiratory failure, renal failure, pneumonia, infection, other late complications related to structural valve deterioration or migration, or other complications that might ultimately cause a temporary or permanent loss of functional independence or other long term morbidity. The patient provides full informed consent for the procedure as described and all questions were answered.     DETAILS OF THE OPERATIVE PROCEDURE  PREPARATION:    The patient was brought to the operating room on the above mentioned date and appropriate monitoring was established by the anesthesia team. The patient was placed in the supine position on the operating table.  Intravenous antibiotics were administered.  General endotracheal anesthesia was induced uneventfully.    Baseline transesophageal echocardiogram was performed. The patient's neck, chest and abdomen and both groins were prepped and draped in a sterile manner. A time out procedure was performed.   PERIPHERAL ACCESS:    Using the modified Seldinger technique, femoral arterial and venous access was obtained with placement of 6 Fr sheaths on the right side.  A pigtail diagnostic catheter was passed through the right arterial sheath under fluoroscopic guidance into the aortic root.  A temporary transvenous pacemaker catheter was passed through the right femoral venous sheath under fluoroscopic guidance into the right ventricle.  The pacemaker was tested to ensure stable lead placement and pacemaker capture. Aortic root angiography was performed in order to determine the optimal angiographic angle for valve deployment.   LEFT SUBCLAVIAN ARTERY ACCESS:   A transverse incision was made below the left clavicle and carried down through the subcutaneous tissue using electrocautery. The pectoralis major muscle was split along its fibers and the pectoralis minor muscle retracted laterally. The  left axillary artery was identified and encircled with a vessel loop. The patient was heparinized systemically and ACT verified > 250 seconds.  A double concentric purse string suture of CV-4 gortex was placed in the anterior wall of the artery. The artery was cannulated with a needle and a J- wire advanced into the ascending aorta. An 8 F sheath was inserted over the wire. The aortic valve was crossed with a JR4 catheter and a straight wire. This was exchanged for a pigtail catheter and position was confirmed in the LV apex.   The pigtail catheter was exchanged for an Amplatz Extra-stiff wire in the LV apex.  Then a 44F E-sheath was inserted into the axillary artery and the tip advanced into the aortic arch.  BALLOON AORTIC VALVULOPLASTY:   Not performed.  TRANSCATHETER HEART VALVE DEPLOYMENT:   An Edwards Sapien 3  transcatheter heart valve (size 29 mm) was prepared and crimped per manufacturer's guidelines, and the proper orientation of the valve is confirmed on the [employer] delivery system. The valve was advanced through the introducer sheath using normal technique until in an appropriate position in the ascending aorta beyond the sheath tip. The balloon was then retracted and using the fine-tuning wheel was centered on the valve. The valve was then advanced across the aortic arch using appropriate flexion of the catheter. The valve was carefully positioned across the aortic valve annulus. The Commander catheter was retracted using normal technique. Once final position of the valve has been confirmed by angiographic assessment, the valve is deployed while temporarily holding ventilation and during rapid ventricular pacing to maintain systolic blood pressure < 50 mmHg and pulse pressure < 10 mmHg. The balloon inflation is held for >3 seconds after reaching full deployment volume. Once the balloon has fully deflated the balloon is retracted into  the ascending aorta and valve function is assessed  using echocardiography. There is felt to be trivial paravalvular leak and no central aortic insufficiency. Post-procedural gradients were acceptable. The patient's hemodynamic recovery following valve deployment is good.  The deployment balloon and guidewire are both removed.     PROCEDURE COMPLETION:   The sheath was removed and subclavian artery closure performed.  Protamine was administered once subclavian arterial repair was complete. The temporary pacemaker, pigtail catheters and femoral sheaths were removed with manual pressure used for hemostasis.  A Mynx femoral closure device was utilized following removal of the diagnostic sheath in the right femoral artery.  The patient tolerated the procedure well and is transported to the cath lab recovery area in stable condition. There were no immediate intraoperative complications. All sponge instrument and needle counts are verified correct at completion of the operation.   No blood products were administered during the operation.  The patient received a total of 40.6 mL of intravenous contrast during the procedure.   Gaye Pollack, MD 12/24/2021

## 2021-12-25 ENCOUNTER — Encounter (HOSPITAL_COMMUNITY): Payer: Self-pay | Admitting: Cardiovascular Disease

## 2021-12-25 ENCOUNTER — Inpatient Hospital Stay (HOSPITAL_COMMUNITY): Payer: Medicare Other

## 2021-12-25 DIAGNOSIS — I350 Nonrheumatic aortic (valve) stenosis: Principal | ICD-10-CM

## 2021-12-25 DIAGNOSIS — I5043 Acute on chronic combined systolic (congestive) and diastolic (congestive) heart failure: Secondary | ICD-10-CM | POA: Diagnosis not present

## 2021-12-25 DIAGNOSIS — Z952 Presence of prosthetic heart valve: Secondary | ICD-10-CM | POA: Diagnosis not present

## 2021-12-25 DIAGNOSIS — E876 Hypokalemia: Secondary | ICD-10-CM

## 2021-12-25 LAB — ECHOCARDIOGRAM COMPLETE
AR max vel: 2.54 cm2
AV Area VTI: 2.7 cm2
AV Area mean vel: 2.69 cm2
AV Mean grad: 7.4 mmHg
AV Peak grad: 13.4 mmHg
Ao pk vel: 1.83 m/s
Area-P 1/2: 3.11 cm2
Calc EF: 47.5 %
Height: 71 in
S' Lateral: 4.9 cm
Single Plane A2C EF: 52.6 %
Single Plane A4C EF: 42.3 %
Weight: 2960 oz

## 2021-12-25 LAB — BASIC METABOLIC PANEL
Anion gap: 9 (ref 5–15)
BUN: 25 mg/dL — ABNORMAL HIGH (ref 8–23)
CO2: 26 mmol/L (ref 22–32)
Calcium: 8.2 mg/dL — ABNORMAL LOW (ref 8.9–10.3)
Chloride: 103 mmol/L (ref 98–111)
Creatinine, Ser: 1.29 mg/dL — ABNORMAL HIGH (ref 0.61–1.24)
GFR, Estimated: 55 mL/min — ABNORMAL LOW (ref >60)
Glucose, Bld: 127 mg/dL — ABNORMAL HIGH (ref 70–99)
Potassium: 3.1 mmol/L — ABNORMAL LOW (ref 3.5–5.1)
Sodium: 138 mmol/L (ref 135–145)

## 2021-12-25 LAB — CBC
HCT: 28.4 % — ABNORMAL LOW (ref 39.0–52.0)
Hemoglobin: 9.3 g/dL — ABNORMAL LOW (ref 13.0–17.0)
MCH: 29 pg (ref 26.0–34.0)
MCHC: 32.7 g/dL (ref 30.0–36.0)
MCV: 88.5 fL (ref 80.0–100.0)
Platelets: 129 10*3/uL — ABNORMAL LOW (ref 150–400)
RBC: 3.21 MIL/uL — ABNORMAL LOW (ref 4.22–5.81)
RDW: 14.1 % (ref 11.5–15.5)
WBC: 7.1 10*3/uL (ref 4.0–10.5)
nRBC: 0 % (ref 0.0–0.2)

## 2021-12-25 LAB — GLUCOSE, CAPILLARY
Glucose-Capillary: 132 mg/dL — ABNORMAL HIGH (ref 70–99)
Glucose-Capillary: 99 mg/dL (ref 70–99)

## 2021-12-25 MED ORDER — AMLODIPINE BESYLATE 5 MG PO TABS: 5.0000 mg | ORAL_TABLET | Freq: Every day | ORAL | 3 refills | Status: DC

## 2021-12-25 MED ORDER — POTASSIUM CHLORIDE CRYS ER 20 MEQ PO TBCR
40.0000 meq | EXTENDED_RELEASE_TABLET | Freq: Once | ORAL | Status: AC
Start: 2021-12-25 — End: 2021-12-25
  Administered 2021-12-25: 40 meq via ORAL
  Filled 2021-12-25: qty 2

## 2021-12-25 NOTE — Anesthesia Postprocedure Evaluation (Signed)
Anesthesia Post Note  Patient: Nicholas Shelton  Procedure(s) Performed: Transcatheter Aortic Valve Replacement-Subclavian (Chest) TRANSESOPHAGEAL ECHOCARDIOGRAM (TEE)     Patient location during evaluation: Cath Lab Anesthesia Type: General Level of consciousness: awake and alert Pain management: pain level controlled Vital Signs Assessment: post-procedure vital signs reviewed and stable Respiratory status: spontaneous breathing, nonlabored ventilation, respiratory function stable and patient connected to nasal cannula oxygen Cardiovascular status: blood pressure returned to baseline and stable Postop Assessment: no apparent nausea or vomiting Anesthetic complications: no   No notable events documented.  Last Vitals:  Vitals:   12/25/21 0749 12/25/21 0800  BP: 139/69 (!) 131/58  Pulse: 63 66  Resp: 12 20  Temp:  36.7 C  SpO2: 98% 95%    Last Pain:  Vitals:   12/25/21 0800  TempSrc: Oral  PainSc:                  [place]

## 2021-12-25 NOTE — Progress Notes (Signed)
CARDIAC REHAB PHASE I   PRE:  Rate/Rhythm: 60 SR  BP:  Sitting: 162/20      SaO2: 95 RA  MODE:  Ambulation: 300 ft   POST:  Rate/Rhythm: 88 SR  BP:  Sitting: 168/70    SaO2: 96 RA   Pt ambulated 334ft in hallway handheld assist with steady gait. Pt denies CP, SOB, or dizziness. Pt returned to bed. Pt educated on site care and restriction. Encouraged continued ambulation with emphasis on safety. Pt declines CRP II at this time. Hopeful to d/c today.  [id number] Rufina Falco, RN BSN 12/25/2021 9:58 AM

## 2021-12-25 NOTE — Progress Notes (Signed)
Patient given discharge instructions. PIVs removed. Telemetry box removed, CCMD notified. Patient taken to vehicle by wheelchair by staff.  Daymon Larsen, RN

## 2021-12-25 NOTE — TOC Transition Note (Signed)
Transition of Care (TOC) - CM/SW Discharge Note Marvetta Gibbons RN, BSN Transitions of Care Unit 4E- RN Case Manager See Treatment Team for direct phone #    Patient Details  Name: Nicholas Shelton MRN: [id number] Date of Birth: 08-11-1941  Transition of Care [facility]) CM/SW Contact:  Dawayne Patricia, RN Phone Number: 12/25/2021, 11:16 AM   Clinical Narrative:    Pt s/p TAVR, stable for transition home today, Transition of Care Department [facility]) has reviewed patient and no TOC needs have been identified at this time.   Final next level of care: Home/Self Care Barriers to Discharge: No Barriers Identified   Patient Goals and CMS Choice     Choice offered to / list presented to : NA  Discharge Placement                 Home      Discharge Plan and Services                DME Arranged: N/A DME Agency: NA       HH Arranged: NA HH Agency: NA        Social Determinants of Health (SDOH) Interventions     Readmission Risk Interventions Readmission Risk Prevention Plan 12/25/2021  Post Dischage Appt Complete  Medication Screening Complete  Transportation Screening Complete  Some recent data might be hidden

## 2021-12-25 NOTE — Discharge Summary (Addendum)
Nicholas Shelton  Discharge Summary    Patient ID: Nicholas Shelton MRN: [id number]; DOB: 07/20/1941  Admit date: 12/24/2021 Discharge date: 12/25/2021  Primary Care Provider: Christain Sacramento, MD  Primary Cardiologist: Dr. Terri Skains, MD / Dr. Burt Knack, MD & Dr. Cyndia Bent, MD (TAVR)  Discharge Diagnoses    Principal Problem:   S/P TAVR (transcatheter aortic valve replacement) Active Problems:   HTN (hypertension)   HLD (hyperlipidemia)   Type 2 diabetes mellitus with complication, without long-term current use of insulin (HCC)   Hx of tobacco use, presenting hazards to health   Nonischemic cardiomyopathy ([place])   Severe aortic stenosis   Acute on chronic systolic heart failure (HCC)   Hypokalemia  Allergies No Known Allergies  Diagnostic Studies/Procedures    TAVR OPERATIVE NOTE     Date of Procedure:                12/24/2021   Preoperative Diagnosis:      Severe Aortic Stenosis    Postoperative Diagnosis:    Same    Procedure:        Transcatheter Aortic Valve Replacement - Left Subclavian Artery Approach             Edwards Sapien 3  THV (size 29 mm, model # 9600TFX, serial # [id number])              Co-Surgeons:                        Gaye Pollack, MD and Sherren Mocha, MD       Anesthesiologist:                  Albertha Ghee, MD   Echocardiographer:              Marry Guan, MD   Pre-operative Echo Findings: Severe aortic stenosis Normal left ventricular systolic function   Post-operative Echo Findings: trace paravalvular leak Normal left ventricular systolic function _____________   Echo 12/25/21: Completed but pending formal read at the time of discharge   History of Present Illness     Nicholas Shelton is a 81 y.o. male with a history of hypertension, hyperlipidemia, non-insulin-dependent diabetes mellitus type 2, former tobacco use, new cardiomyopathy/systolic CHF with EF at 85%, and severe aortic stenosis  who presented  to [facility] on 12/24/21 for planned TAVR.   Nicholas Shelton does not recall being told of a heart murmur until last year and was referred to Dr. Brennan Bailey Shelton for monitoring of moderate aortic stenosis however  on a recent echocardiogram in 10/2021, his LVEF at 40% with global hypokinesis. Peak and mean transvalvular gradients are 48 and 30 mmHg, respectively. The dimensionless index is 0.25 and the calculated aortic valve area is 0.99 cm.  There was no other significant valvular disease identified. RV function is normal. Cardiac catheterization performed last year showed findings of widely patent coronary arteries with no significant obstruction.  He was evaluated by the multidisciplinary valve Shelton, including Dr. Cyndia Bent and felt to have severe, symptomatic aortic stenosis and to be a suitable candidate for TAVR, which was set up for 12/24/21.     Hospital Course     Severe AS: s/p successful TAVR with a 29 mm Edwards Sapien 3 UR THV via the subclavian  approach on 12/24/21. Post operative echo pending. [place] site stable with no evidence of bleeding or oozing. ECG with NSR, 1st degree AV block  and PACs however no high grade heart block. Continue ASA monotherapy. The patient has ambulated with CRI without issues. Post procedure site reviewed with the patient and will be placed in the AVS. Follow up appointment with myself next Monday. No need for SBE, has full dentures.    Hypertension: Patient noted to be markedly hypertensive during procedure. Looking back at prior OV notes, appears that his baseline is in the 160-190/70-90 range. Post TAVR SBPs remained in the 200 range despite IV NTG and IV hydralazine. He was kept in the cath lab holding area for several hours for titration. His home antihypertensives were restarted including clonidine and Entresto. We started him on amlodipine and he was given IV labetalol PRN. By the morning of discharge, he was off IV NTG and BP was 131/58. Will have him follow with Dr. Terri Skains for  further medication titrations.   Hyperlipidemia: Last LDL, 117. Will need better control. Consider referral to lipid clinic.   Non-insulin-dependent diabetes mellitus type 2: SSI while inpatient. Resume home medications.   Non ischemic cardiomyopathy: Echocardiogram with EF at 40%. Continue Entresto and PO Lasix. See plan below   Acute on chronic systolic CHF: Pre TAVR BNP elevated with mild crackles on exam post procedure therefore he was treated with IV Lasix 40mg  x1. Appears euvolemic on exam today.   Hypokalemia: K+ 3.1 today. Replace with KDUR 20meq now and will recheck BMET at follow up next week.   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 Burt Knack who feels that he is stable and ready for discharge today, 12/25/21.  _____________  Discharge Vitals Blood pressure (!) 131/58, pulse 66, temperature 98.1 F (36.7 C), temperature source Oral, resp. rate 20, height 5\' 11"  (1.803 m), weight 83.9 kg, SpO2 95 %.  Filed Weights   12/24/21 0603  Weight: 83.9 kg   General: Well developed, well nourished, NAD Neck: Negative for carotid bruits. No JVD Lungs:Clear to ausculation bilaterally. Breathing is unlabored. Cardiovascular: RRR with S1 S2. No murmur Abdomen: Soft, non-tender, non-distended. No obvious abdominal masses. Extremities: No edema.  Neuro: Alert and oriented. No focal deficits. No facial asymmetry. MAE spontaneously. Psych: Responds to questions appropriately with normal affect.    Labs & Radiologic Studies    CBC Recent Labs    12/24/21 1019 12/25/21 0218  WBC  --  7.1  HGB 12.2* 9.3*  HCT 36.0* 28.4*  MCV  --  88.5  PLT  --  643*   Basic Metabolic Panel Recent Labs    12/24/21 1019 12/25/21 0218  NA 141 138  K 3.4* 3.1*  CL 105 103  CO2  --  26  GLUCOSE 157* 127*  BUN 23 25*  CREATININE 0.80 1.29*  CALCIUM  --  8.2*   Liver Function Tests No  results for input(s): AST, ALT, ALKPHOS, BILITOT, PROT, ALBUMIN in the last 72 hours. No results for input(s): LIPASE, AMYLASE in the last 72 hours. Cardiac Enzymes No results for input(s): CKTOTAL, CKMB, CKMBINDEX, TROPONINI in the last 72 hours. BNP Invalid input(s): POCBNP D-Dimer No results for input(s): DDIMER in the last 72 hours. Hemoglobin A1C No results for input(s): HGBA1C in the last 72 hours. Fasting Lipid Panel No results for input(s): CHOL, HDL, LDLCALC, TRIG, CHOLHDL, LDLDIRECT in the last 72 hours. Thyroid Function Tests No results for input(s): TSH, T4TOTAL, T3FREE, THYROIDAB in the last 72 hours.  Invalid input(s): FREET3  _____________  DG Chest 2 View  Result Date: 12/22/2021 CLINICAL DATA:  Patient admitted for TAVR shortness of breath EXAM: CHEST - 2 VIEW COMPARISON:  CT done on 12/05/2021 FINDINGS: Transverse diameter of heart is increased. Central pulmonary vessels are prominent. There is increase in interstitial markings in the parahilar regions and lower lung fields. There is no focal pulmonary consolidation. There is minimal blunting of posterior CP angles. There is possible decrease in the amount of right pleural effusion. There is no pneumothorax. Degenerative changes are noted in the left shoulder. IMPRESSION: Cardiomegaly. Central pulmonary vessels are prominent suggesting possible mild CHF. Small pleural effusions. Electronically Signed   By: Elmer Picker M.D.   On: 12/22/2021 17:37   CT CORONARY MORPH W/CTA COR W/SCORE W/CA W/CM &/OR WO/CM  Addendum Date: 12/05/2021   ADDENDUM REPORT: 12/05/2021 12:31 CLINICAL DATA:  Severe Aortic Stenosis. EXAM: Cardiac TAVR CT TECHNIQUE: A non-contrast, gated CT scan was obtained with axial slices of 3 mm through the heart for aortic valve calcium scoring. A 110 kV retrospective, gated, contrast cardiac scan was obtained. Gantry rotation speed was 250 msecs and collimation was 0.6 mm. Nitroglycerin was not given. The 3D  data set was reconstructed in 5% intervals of the 0-95% of the R-R cycle. Systolic and diastolic phases were analyzed on a dedicated workstation using MPR, MIP, and VRT modes. The patient received 100 cc of contrast. FINDINGS: Image quality: Excellent. Noise artifact is: Limited. Valve Morphology: Tricuspid aortic valve. Severely calcified with diffuse calcium. Severely restricted leaflet motion in systole. Aortic Valve Calcium score: 2847 Aortic annular dimension: Phase assessed: 15% Annular area: 553 mm2 Annular perimeter: 85.2 mm Max diameter: 30.1 mm Min diameter: 24.2 mm Annular and subannular calcification: Mild single calcification under the [place]. Membranous septum length: 11.8 mm Optimal coplanar projection: LAO 8 CAU 9 Coronary Artery Height above Annulus: Left Main: 16.4 mm Right Coronary: 25.5 mm Sinus of Valsalva Measurements: Non-coronary: 36 mm Right-coronary: 36 mm Left-coronary: 36 mm Sinus of Valsalva Height: Non-coronary: 29.1 mm Right-coronary: 28.8 mm Left-coronary: 25.9 mm Sinotubular Junction: 31 mm.  Circumferential calcifications noted. Ascending Thoracic Aorta: 38 mm Coronary Arteries: Normal coronary origin. Right dominance. The study was performed without use of NTG and is insufficient for plaque evaluation. Please refer to recent cardiac catheterization for coronary assessment. 3-vessel coronary calcifications noted. Cardiac Morphology: Right Atrium: Right atrial size is dilated. Right Ventricle: The right ventricular cavity is dilated. Left Atrium: Left atrial size is dilated.  Small, hypoplastic LAA. Left Ventricle: The ventricular cavity size is dilated. There are no stigmata of prior infarction. There is no abnormal filling defect. Mildly reduced left ventricular function, LVEF=41%. Global hypokinesis. Pulmonary arteries: Dilated, suggest of pulmonary hypertension. No proximal filling defect. Pulmonary veins: Normal pulmonary venous drainage. Pericardium: Normal thickness. Small  circumferential pericardial effusion. Mitral Valve: The mitral valve is normal structure without significant calcification. Extra-cardiac findings: See attached radiology report for non-cardiac structures. IMPRESSION: 1. Tricuspid aortic valve with diffuse severe calcifications. Severe aortic stenosis (calcium score 2847). 2. Annular measurements appropriate for 29 mm S3 (553 mm2). 3. Mild single calcification under the [place]. 4. Sufficient coronary to annulus distance. 5. Optimal Fluoroscopic Angle for Delivery: LAO 8 CAU 9 6. Mildly reduced left ventricular function, LVEF=41%. Global hypokinesis. 7. Dilated pulmonary artery suggestive of pulmonary hypertension. [place] T. Audie Box, MD Electronically Signed   By: Eleonore Chiquito M.D.   On: 12/05/2021 12:31   Result Date: 12/05/2021 EXAM: OVER-READ INTERPRETATION  CT CHEST The following report is  an over-read performed by radiologist Dr. Vinnie Langton of [facility], [place] on 12/05/2021. This over-read does not include interpretation of cardiac or coronary anatomy or pathology. The coronary calcium score/coronary CTA interpretation by the cardiologist is attached. COMPARISON:  CTA chest 10/24/2018. FINDINGS: Extracardiac findings will be described separately under dictation for contemporaneously obtained CTA chest, abdomen and pelvis. IMPRESSION: Please see separate dictation for contemporaneously obtained CTA chest, abdomen and pelvis 12/05/2021 for full description of relevant extracardiac findings. Electronically Signed: By: Vinnie Langton M.D. On: 12/05/2021 11:09   CT ANGIO CHEST AORTA W/CM & OR WO/CM  Result Date: 12/05/2021 CLINICAL DATA:  81 year old male with history of severe aortic stenosis. Preprocedural study prior to potential transcatheter aortic valve replacement. EXAM: CT ANGIOGRAPHY CHEST, ABDOMEN AND PELVIS TECHNIQUE: Non-contrast CT of the chest was initially obtained. Multidetector CT imaging through the chest, abdomen and pelvis was  performed using the standard protocol during bolus administration of intravenous contrast. Multiplanar reconstructed images and MIPs were obtained and reviewed to evaluate the vascular anatomy. RADIATION DOSE REDUCTION: This exam was performed according to the departmental dose-optimization program which includes automated exposure control, adjustment of the mA and/or kV according to patient size and/or use of iterative reconstruction technique. CONTRAST:  84mL OMNIPAQUE IOHEXOL 350 MG/ML SOLN COMPARISON:  CTA of the chest, abdomen and pelvis 10/24/2018. CT of the abdomen and pelvis 10/30/2018. FINDINGS: CTA CHEST FINDINGS Cardiovascular: Heart size is mildly enlarged. Small amount of pericardial fluid and/or thickening, unlikely to be of hemodynamic significance at this time. No pericardial calcification. There is aortic atherosclerosis, as well as atherosclerosis of the great vessels of the mediastinum and the coronary arteries, including calcified atherosclerotic plaque in the left main, left anterior descending, left circumflex and right coronary arteries. Severe thickening and calcification of the aortic valve. Mediastinum/Lymph Nodes: Multiple prominent borderline enlarged mediastinal and hilar lymph nodes are noted, but nonspecific. No definite pathologic lymphadenopathy identified in mediastinal or hilar nodal stations. Esophagus is unremarkable in appearance. No axillary lymphadenopathy. Lungs/Pleura: Moderate right and small left pleural effusions lying dependently. Areas of passive subsegmental atelectasis are noted in the lower lobes of the lungs bilaterally. Mild patchy areas of ground-glass attenuation and interlobular septal thickening are noted throughout the lungs bilaterally, suggesting a background of mild interstitial pulmonary edema. No acute consolidative airspace disease. No suspicious appearing pulmonary nodules or masses are noted. Musculoskeletal/Soft Tissues: There are no aggressive  appearing lytic or blastic lesions noted in the visualized portions of the skeleton. CTA ABDOMEN AND PELVIS FINDINGS Hepatobiliary: Liver has a slightly shrunken appearance and nodular contour, suggesting underlying cirrhosis. No suspicious cystic or solid hepatic lesions. No intra or extrahepatic biliary ductal dilatation. Gallbladder is normal in appearance. Pancreas: No pancreatic mass. No pancreatic ductal dilatation. No pancreatic or peripancreatic fluid collections or inflammatory changes. Spleen: Unremarkable. Adrenals/Urinary Tract: Multiple low-attenuation lesions in both kidneys, compatible with simple cysts, largest of which is exophytic extending from the posterior aspect of the upper pole of the left kidney measuring 8.1 x 5.5 cm. Several other subcentimeter low-attenuation lesions in both kidneys are too small to definitively characterize, but are favored to represent tiny cysts. No hydroureteronephrosis. Urinary bladder is normal in appearance. Bilateral adrenal glands are normal in appearance. Stomach/Bowel: The appearance of the stomach is normal. There is no pathologic dilatation of small bowel or colon. Normal appendix. Vascular/Lymphatic: Aortic atherosclerosis, without evidence of aneurysm or dissection in the abdominal or pelvic vasculature. Vascular findings and measurements pertinent to potential TAVR procedure, as detailed below.  No lymphadenopathy noted in the abdomen or pelvis. Reproductive: Prostate gland and seminal vesicles are unremarkable in appearance. Other: Small volume of ascites.  No pneumoperitoneum. Musculoskeletal: There are no aggressive appearing lytic or blastic lesions noted in the visualized portions of the skeleton. VASCULAR MEASUREMENTS PERTINENT TO TAVR: AORTA: Minimal Aortic Diameter-14 x 12 mm Severity of Aortic Calcification-severe RIGHT PELVIS: Right Common Iliac Artery - Minimal Diameter-8.7 x 5.8 mm Tortuosity-mild Calcification-severe Right External Iliac Artery  - Minimal Diameter-7.0 x 4.2 mm Tortuosity-moderate Calcification-severe Right Common Femoral Artery - Minimal Diameter-5.1 x 4.1 mm Tortuosity-mild Calcification-severe LEFT PELVIS: Left Common Iliac Artery - Minimal Diameter-6.8 x 2.1 mm Tortuosity-mild Calcification-severe Left External Iliac Artery - Minimal Diameter-7.6 x 4.7 mm Tortuosity-moderate Calcification-moderate Left Common Femoral Artery - Minimal Diameter-4.3 x 2.7 mm Tortuosity-mild Calcification-severe Review of the MIP images confirms the above findings. IMPRESSION: 1. Vascular findings and measurements pertinent to potential TAVR procedure, as detailed above. 2. Severe thickening and calcification of the aortic valve, compatible with reported clinical history of severe aortic stenosis. 3. Mild cardiomegaly with moderate right and small left pleural effusions, as well as evidence of interstitial pulmonary edema in the lungs; imaging findings concerning for congestive heart failure. 4. Small amount of pericardial fluid and/or thickening. No pericardial calcification. 5. Aortic atherosclerosis, in addition to left main and three-vessel coronary artery disease. 6. 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 7. Small volume of ascites. 8. Additional incidental findings, as above. Electronically Signed   By: Vinnie Langton M.D.   On: 12/05/2021 12:17   ECHOCARDIOGRAM COMPLETE  Result Date: 12/25/2021    ECHOCARDIOGRAM REPORT   Patient Name:   Nicholas Shelton Date of Exam: 12/25/2021 Medical Rec #:  [id number]  Height:       71.0 in Accession #:    [id number] Weight:       185.0 lb Date of Birth:  [birth year]  BSA:          2.040 m Patient Age:    28 years   BP:           108/46 mmHg Patient Gender: M          HR:           56 bpm. Exam Location:  Inpatient Procedure: 2D Echo, Cardiac Doppler and Color Doppler Indications:    Post TAVR evaluation V43.3 / Z95.2  History:        Patient has prior history of Echocardiogram examinations,  most                 recent 12/24/2021. Arrythmias:PAC, PVC and AV block,                 Signs/Symptoms:Murmur; Risk Factors:Hypertension, Diabetes and                 Dyslipidemia.                 Aortic Valve: 29 mm Sapien prosthetic, stented (TAVR) valve is                 present in the aortic position. Procedure Date: 12/24/2021.  Sonographer:    Darlina Sicilian RDCS Referring Phys: [phone] JILL D MCDANIEL IMPRESSIONS  1. 29 mm S3. Vmax 1.8 m/s, MG 7.4 mmHG, EOA 2.70 cm2, DI 0.51. No regurgitation or paravalvular leak. The aortic valve has been repaired/replaced. Aortic valve regurgitation is not visualized. There is a 29 mm Sapien prosthetic (TAVR) valve present in the aortic position. Procedure  Date: 12/24/2021. Echo findings are consistent with normal structure and function of the aortic valve prosthesis.  2. Left ventricular ejection fraction, by estimation, is 40 to 45%. The left ventricle has mildly decreased function. The left ventricle demonstrates global hypokinesis. The left ventricular internal cavity size was mildly dilated. Left ventricular diastolic parameters are consistent with Grade III diastolic dysfunction (restrictive).  3. Right ventricular systolic function is normal. The right ventricular size is mildly enlarged. There is mildly elevated pulmonary artery systolic pressure. The estimated right ventricular systolic pressure is 70.1 mmHg.  4. Left atrial size was mildly dilated.  5. A small pericardial effusion is present. The pericardial effusion is circumferential. There is no evidence of cardiac tamponade.  6. The mitral valve is degenerative. Trivial mitral valve regurgitation. No evidence of mitral stenosis.  7. The inferior vena cava is dilated in size with >50% respiratory variability, suggesting right atrial pressure of 8 mmHg. FINDINGS  Left Ventricle: Left ventricular ejection fraction, by estimation, is 40 to 45%. The left ventricle has mildly decreased function. The left ventricle  demonstrates global hypokinesis. The left ventricular internal cavity size was mildly dilated. There is  no left ventricular hypertrophy. Left ventricular diastolic parameters are consistent with Grade III diastolic dysfunction (restrictive). Right Ventricle: The right ventricular size is mildly enlarged. No increase in right ventricular wall thickness. Right ventricular systolic function is normal. There is mildly elevated pulmonary artery systolic pressure. The tricuspid regurgitant velocity is 2.96 m/s, and with an assumed right atrial pressure of 8 mmHg, the estimated right ventricular systolic pressure is 77.9 mmHg. Left Atrium: Left atrial size was mildly dilated. Right Atrium: Right atrial size was normal in size. Pericardium: A small pericardial effusion is present. The pericardial effusion is circumferential. There is no evidence of cardiac tamponade. Mitral Valve: The mitral valve is degenerative in appearance. Mild mitral annular calcification. Trivial mitral valve regurgitation. No evidence of mitral valve stenosis. Tricuspid Valve: The tricuspid valve is grossly normal. Tricuspid valve regurgitation is mild . No evidence of tricuspid stenosis. Aortic Valve: 29 mm S3. Vmax 1.8 m/s, MG 7.4 mmHG, EOA 2.70 cm2, DI 0.51. No regurgitation or paravalvular leak. The aortic valve has been repaired/replaced. Aortic valve regurgitation is not visualized. Aortic valve mean gradient measures 7.4 mmHg. Aortic valve peak gradient measures 13.4 mmHg. Aortic valve area, by VTI measures 2.70 cm. There is a 29 mm Sapien prosthetic, stented (TAVR) valve present in the aortic position. Procedure Date: 12/24/2021. Echo findings are consistent with normal structure and function of the aortic valve prosthesis. Pulmonic Valve: The pulmonic valve was grossly normal. Pulmonic valve regurgitation is not visualized. No evidence of pulmonic stenosis. Aorta: The aortic root and ascending aorta are structurally normal, with no  evidence of dilitation. Venous: The inferior vena cava is dilated in size with greater than 50% respiratory variability, suggesting right atrial pressure of 8 mmHg. IAS/Shunts: The atrial septum is grossly normal.  LEFT VENTRICLE PLAX 2D LVIDd:         6.00 cm      Diastology LVIDs:         4.90 cm      LV e' medial:    4.75 cm/s LV PW:         1.10 cm      LV E/e' medial:  24.4 LV IVS:        1.10 cm      LV e' lateral:   5.31 cm/s LVOT diam:     2.60 cm  LV E/e' lateral: 21.8 LV SV:         105 LV SV Index:   52 LVOT Area:     5.31 cm  LV Volumes (MOD) LV vol d, MOD A2C: 149.0 ml LV vol d, MOD A4C: 166.0 ml LV vol s, MOD A2C: 70.7 ml LV vol s, MOD A4C: 95.8 ml LV SV MOD A2C:     78.3 ml LV SV MOD A4C:     166.0 ml LV SV MOD BP:      75.9 ml RIGHT VENTRICLE RV S prime:     8.37 cm/s TAPSE (M-mode): 1.7 cm LEFT ATRIUM             Index        RIGHT ATRIUM           Index LA diam:        5.30 cm 2.60 cm/m   RA Area:     16.50 cm LA Vol (A2C):   79.1 ml 38.78 ml/m  RA Volume:   43.70 ml  21.42 ml/m LA Vol (A4C):   77.7 ml 38.09 ml/m LA Biplane Vol: 82.0 ml 40.20 ml/m  AORTIC VALVE AV Area (Vmax):    2.54 cm AV Area (Vmean):   2.69 cm AV Area (VTI):     2.70 cm AV Vmax:           182.80 cm/s AV Vmean:          126.400 cm/s AV VTI:            0.390 m AV Peak Grad:      13.4 mmHg AV Mean Grad:      7.4 mmHg LVOT Vmax:         87.50 cm/s LVOT Vmean:        64.000 cm/s LVOT VTI:          0.198 m LVOT/AV VTI ratio: 0.51  AORTA Ao Asc diam: 3.50 cm MITRAL VALVE                TRICUSPID VALVE MV Area (PHT): 3.11 cm     TR Peak grad:   35.0 mmHg MV Decel Time: 244 msec     TR Vmax:        296.00 cm/s MV E velocity: 116.00 cm/s MV A velocity: 56.30 cm/s   SHUNTS MV E/A ratio:  2.06         Systemic VTI:  0.20 m                             Systemic Diam: 2.60 cm Eleonore Chiquito MD Electronically signed by Eleonore Chiquito MD Signature Date/Time: 12/25/2021/10:58:00 AM    Final    ECHO TEE  Result Date: 12/24/2021     TRANSESOPHOGEAL ECHO REPORT   Patient Name:   Nicholas Shelton Date of Exam: 12/24/2021 Medical Rec #:  [id number]  Height:       71.0 in Accession #:    [id number] Weight:       185.0 lb Date of Birth:  06/26/1941  BSA:          2.040 m Patient Age:    81 years   BP:           200/87 mmHg Patient Gender: M          HR:           43 bpm. Exam Location:  Inpatient Procedure: Transesophageal  Echo, Color Doppler and Cardiac Doppler Indications:     Aortic Stenosis i35.0  History:         Patient has prior history of Echocardiogram examinations, most                  recent 10/21/2021. Risk Factors:Hypertension, Diabetes and                  Dyslipidemia.                  Aortic Valve: 29 mm Sapien prosthetic, stented (TAVR) valve is                  present in the aortic position. Procedure Date: 12/24/2021.  Sonographer:     Raquel Sarna Senior RDCS Referring Phys:  [id number] [place] Diagnosing Phys: Eleonore Chiquito MD  Sonographer Comments: 90mm Edwards [phone] Implanted PROCEDURE: The transesophogeal probe was passed without difficulty through the esophogus of the patient. Sedation performed by different physician. Image quality was excellent. The patient's vital signs; including heart rate, blood pressure, and oxygen saturation; remained stable throughout the procedure. The patient developed no complications during the procedure. IMPRESSIONS  1. TEE guided TAVR. 29 mm S3 deployed in aortic position for severe AS. Pre procedure. Vmax 3.6 m/s, MG 34 mmHG, DI 0.22. After deployment: V max 1.1 m/s, MG 2.0 mmHG, EOA 3.28 cm2, DI 0.68. Trivial was present. Valve was well seated. No immediate complications. Small pericardial effusion was present before and after the case. The aortic valve has been repaired/replaced. Aortic valve regurgitation is trivial. There is a 29 mm Sapien prosthetic (TAVR) valve present in the aortic position. Procedure  Date: 12/24/2021.  2. Left ventricular ejection fraction, by estimation, is 40 to 45%. The  left ventricle has mildly decreased function. The left ventricle demonstrates global hypokinesis.  3. Right ventricular systolic function is normal. The right ventricular size is mildly enlarged.  4. Left atrial size was mild to moderately dilated. No left atrial/left atrial appendage thrombus was detected.  5. A small pericardial effusion is present. The pericardial effusion is posterior to the left ventricle.  6. The mitral valve is grossly normal. Mild mitral valve regurgitation. No evidence of mitral stenosis.  7. There is mild (Grade II) layered plaque involving the descending aorta. FINDINGS  Left Ventricle: Left ventricular ejection fraction, by estimation, is 40 to 45%. The left ventricle has mildly decreased function. The left ventricle demonstrates global hypokinesis. The left ventricular internal cavity size was normal in size. Right Ventricle: The right ventricular size is mildly enlarged. No increase in right ventricular wall thickness. Right ventricular systolic function is normal. Left Atrium: Left atrial size was mild to moderately dilated. No left atrial/left atrial appendage thrombus was detected. Right Atrium: Right atrial size was normal in size. Pericardium: A small pericardial effusion is present. The pericardial effusion is posterior to the left ventricle. Mitral Valve: The mitral valve is grossly normal. Mild mitral valve regurgitation. No evidence of mitral valve stenosis. Tricuspid Valve: The tricuspid valve is grossly normal. Tricuspid valve regurgitation is mild . No evidence of tricuspid stenosis. Aortic Valve: TEE guided TAVR. 29 mm S3 deployed in aortic position for severe AS. Pre procedure. Vmax 3.6 m/s, MG 34 mmHG, DI 0.22. After deployment: V max 1.1 m/s, MG 2.0 mmHG, EOA 3.28 cm2, DI 0.68. Trivial was present. Valve was well seated. No immediate complications. Small pericardial effusion was present before and after the case. The aortic valve has been repaired/replaced.  Aortic valve  regurgitation is trivial. Aortic valve mean gradient measures 2.0 mmHg. Aortic valve peak gradient measures 4.8 mmHg. Aortic valve area, by VTI measures 3.28 cm. There is a 29 mm Sapien prosthetic, stented (TAVR) valve present in the aortic position. Procedure Date: 12/24/2021. Pulmonic Valve: The pulmonic valve was grossly normal. Pulmonic valve regurgitation is trivial. No evidence of pulmonic stenosis. Aorta: The aortic root and ascending aorta are structurally normal, with no evidence of dilitation. There is mild (Grade II) layered plaque involving the descending aorta. Venous: The left upper pulmonary vein and right upper pulmonary vein are normal. IAS/Shunts: The atrial septum is grossly normal.  LEFT VENTRICLE PLAX 2D LVOT diam:     2.47 cm LV SV:         92 LV SV Index:   45 LVOT Area:     4.79 cm  AORTIC VALVE AV Area (Vmax):    3.51 cm AV Area (Vmean):   1.57 cm AV Area (VTI):     3.28 cm AV Vmax:           110.00 cm/s AV Vmean:          178.650 cm/s AV VTI:            0.280 m AV Peak Grad:      4.8 mmHg AV Mean Grad:      2.0 mmHg LVOT Vmax:         80.60 cm/s LVOT Vmean:        58.400 cm/s LVOT VTI:          0.192 m LVOT/AV VTI ratio: 0.68  AORTA Ao Root diam: 3.67 cm Ao Asc diam:  3.64 cm  SHUNTS Systemic VTI:  0.19 m Systemic Diam: 2.47 cm Eleonore Chiquito MD Electronically signed by Eleonore Chiquito MD Signature Date/Time: 12/24/2021/2:15:53 PM    Final    Structural Heart Procedure  Result Date: 12/24/2021 See surgical note for result.  CT ANGIO ABDOMEN PELVIS  W &/OR WO CONTRAST  Result Date: 12/05/2021 CLINICAL DATA:  81 year old male with history of severe aortic stenosis. Preprocedural study prior to potential transcatheter aortic valve replacement. EXAM: CT ANGIOGRAPHY CHEST, ABDOMEN AND PELVIS TECHNIQUE: Non-contrast CT of the chest was initially obtained. Multidetector CT imaging through the chest, abdomen and pelvis was performed using the standard protocol during bolus administration of  intravenous contrast. Multiplanar reconstructed images and MIPs were obtained and reviewed to evaluate the vascular anatomy. RADIATION DOSE REDUCTION: This exam was performed according to the departmental dose-optimization program which includes automated exposure control, adjustment of the mA and/or kV according to patient size and/or use of iterative reconstruction technique. CONTRAST:  35mL OMNIPAQUE IOHEXOL 350 MG/ML SOLN COMPARISON:  CTA of the chest, abdomen and pelvis 10/24/2018. CT of the abdomen and pelvis 10/30/2018. FINDINGS: CTA CHEST FINDINGS Cardiovascular: Heart size is mildly enlarged. Small amount of pericardial fluid and/or thickening, unlikely to be of hemodynamic significance at this time. No pericardial calcification. There is aortic atherosclerosis, as well as atherosclerosis of the great vessels of the mediastinum and the coronary arteries, including calcified atherosclerotic plaque in the left main, left anterior descending, left circumflex and right coronary arteries. Severe thickening and calcification of the aortic valve. Mediastinum/Lymph Nodes: Multiple prominent borderline enlarged mediastinal and hilar lymph nodes are noted, but nonspecific. No definite pathologic lymphadenopathy identified in mediastinal or hilar nodal stations. Esophagus is unremarkable in appearance. No axillary lymphadenopathy. Lungs/Pleura: Moderate right and small left pleural effusions lying dependently. Areas of passive subsegmental atelectasis  are noted in the lower lobes of the lungs bilaterally. Mild patchy areas of ground-glass attenuation and interlobular septal thickening are noted throughout the lungs bilaterally, suggesting a background of mild interstitial pulmonary edema. No acute consolidative airspace disease. No suspicious appearing pulmonary nodules or masses are noted. Musculoskeletal/Soft Tissues: There are no aggressive appearing lytic or blastic lesions noted in the visualized portions of the  skeleton. CTA ABDOMEN AND PELVIS FINDINGS Hepatobiliary: Liver has a slightly shrunken appearance and nodular contour, suggesting underlying cirrhosis. No suspicious cystic or solid hepatic lesions. No intra or extrahepatic biliary ductal dilatation. Gallbladder is normal in appearance. Pancreas: No pancreatic mass. No pancreatic ductal dilatation. No pancreatic or peripancreatic fluid collections or inflammatory changes. Spleen: Unremarkable. Adrenals/Urinary Tract: Multiple low-attenuation lesions in both kidneys, compatible with simple cysts, largest of which is exophytic extending from the posterior aspect of the upper pole of the left kidney measuring 8.1 x 5.5 cm. Several other subcentimeter low-attenuation lesions in both kidneys are too small to definitively characterize, but are favored to represent tiny cysts. No hydroureteronephrosis. Urinary bladder is normal in appearance. Bilateral adrenal glands are normal in appearance. Stomach/Bowel: The appearance of the stomach is normal. There is no pathologic dilatation of small bowel or colon. Normal appendix. Vascular/Lymphatic: Aortic atherosclerosis, without evidence of aneurysm or dissection in the abdominal or pelvic vasculature. Vascular findings and measurements pertinent to potential TAVR procedure, as detailed below. No lymphadenopathy noted in the abdomen or pelvis. Reproductive: Prostate gland and seminal vesicles are unremarkable in appearance. Other: Small volume of ascites.  No pneumoperitoneum. Musculoskeletal: There are no aggressive appearing lytic or blastic lesions noted in the visualized portions of the skeleton. VASCULAR MEASUREMENTS PERTINENT TO TAVR: AORTA: Minimal Aortic Diameter-14 x 12 mm Severity of Aortic Calcification-severe RIGHT PELVIS: Right Common Iliac Artery - Minimal Diameter-8.7 x 5.8 mm Tortuosity-mild Calcification-severe Right External Iliac Artery - Minimal Diameter-7.0 x 4.2 mm Tortuosity-moderate Calcification-severe  Right Common Femoral Artery - Minimal Diameter-5.1 x 4.1 mm Tortuosity-mild Calcification-severe LEFT PELVIS: Left Common Iliac Artery - Minimal Diameter-6.8 x 2.1 mm Tortuosity-mild Calcification-severe Left External Iliac Artery - Minimal Diameter-7.6 x 4.7 mm Tortuosity-moderate Calcification-moderate Left Common Femoral Artery - Minimal Diameter-4.3 x 2.7 mm Tortuosity-mild Calcification-severe Review of the MIP images confirms the above findings. IMPRESSION: 1. Vascular findings and measurements pertinent to potential TAVR procedure, as detailed above. 2. Severe thickening and calcification of the aortic valve, compatible with reported clinical history of severe aortic stenosis. 3. Mild cardiomegaly with moderate right and small left pleural effusions, as well as evidence of interstitial pulmonary edema in the lungs; imaging findings concerning for congestive heart failure. 4. Small amount of pericardial fluid and/or thickening. No pericardial calcification. 5. Aortic atherosclerosis, in addition to left main and three-vessel coronary artery disease. 6. 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 7. Small volume of ascites. 8. Additional incidental findings, as above. Electronically Signed   By: Vinnie Langton M.D.   On: 12/05/2021 12:17   Disposition   Pt is being discharged home today in good condition.  Follow-up Plans & Appointments    Follow-up Information     Tommie Raymond, NP Follow up on 12/30/2021.   Specialty: Cardiology Why: 1130am. Please arrive by 11:15am. Contact information: [place]  [address] [phone]                Discharge Instructions     Call MD for:  difficulty breathing, headache or visual disturbances   Complete  by: As directed    Call MD for:  extreme fatigue   Complete by: As directed    Call MD for:  hives   Complete by: As directed    Call MD for:  persistant dizziness or light-headedness    Complete by: As directed    Call MD for:  persistant nausea and vomiting   Complete by: As directed    Call MD for:  redness, tenderness, or signs of infection (pain, swelling, redness, odor or green/yellow discharge around incision site)   Complete by: As directed    Call MD for:  severe uncontrolled pain   Complete by: As directed    Call MD for:  temperature >100.4   Complete by: As directed    Diet - low sodium heart healthy   Complete by: As directed    Discharge instructions   Complete by: As directed    ACTIVITY AND EXERCISE  Daily activity and exercise are an important part of your recovery. People recover at different rates depending on their general health and type of valve procedure.  Most people recovering from TAVR feel better relatively quickly   No lifting, pushing, pulling more than 10 pounds (examples to avoid: groceries, vacuuming, gardening, golfing):             - For one week with a procedure through the groin.             - For six weeks for procedures through the chest wall or neck. NOTE: You will typically see one of our providers 7-14 days after your procedure to discuss [place] the above activities.      DRIVING  Do not drive until you are seen for follow up and cleared by a provider. Generally, we ask patient to not drive for 1 week after their procedure.  If you have been told by your doctor in the past that you may not drive, you must talk with him/her before you begin driving again.   DRESSING  Groin site: you may leave the clear dressing over the site for up to one week or until it falls off.   HYGIENE  If you had a femoral (leg) procedure, you may take a shower when you return home. After the shower, pat the site dry. Do NOT use powder, oils or lotions in your groin area until the site has completely healed.  If you had a chest procedure, you may shower when you return home unless specifically instructed not to by your discharging  practitioner.             - DO NOT scrub incision; pat dry with a towel.             - DO NOT apply any lotions, oils, powders to the incision.             - No tub baths / swimming for at least 2 weeks.  If you notice any fevers, chills, increased pain, swelling, bleeding or pus, please contact your doctor.   ADDITIONAL INFORMATION  If you are going to have an upcoming dental procedure, please contact our office as you will require antibiotics ahead of time to prevent infection on your heart valve.    If you have any questions or concerns you can call the structural heart phone during normal business hours 8am-4pm. If you have an urgent need after hours or weekends please call [phone] to talk to the on call provider for general cardiology.  If you have an emergency that requires immediate attention, please call 911.    After TAVR Checklist  Check  Test Description  Follow up appointment in 1-2 weeks  You will see our structural heart advanced practice provider. Your incision sites will be checked and you will be cleared to drive and resume all normal activities if you are doing well.    1 month echo and follow up  You will have an echo to check on your new heart valve and be seen back in the office by a structural heart advanced practice provider.  Follow up with your primary cardiologist You will need to be seen by your primary cardiologist in the following 3-6 months after your 1 month appointment in the valve clinic.   1 year echo and follow up You will have another echo to check on your heart valve after 1 year and be seen back in the office by a structural heart advanced practice provider. This your last structural heart visit.  Bacterial endocarditis prophylaxis  You will have to take antibiotics for the rest of your life before all dental procedures (even teeth cleanings) to protect your heart valve. Antibiotics are also required before some surgeries. Please check with your  cardiologist before scheduling any surgeries. Also, please make sure to tell us if you have a penicillin allergy as you will require an alternative antibiotic.   If the dressing is still on your incision site when you go home, remove it on the third day after your surgery date. Remove dressing if it begins to fall off, or if it is dirty or damaged before the third day.   Complete by: As directed    Increase activity slowly   Complete by: As directed       Discharge Medications   Allergies as of 12/25/2021   No Known Allergies      Medication List     TAKE these medications    acetaminophen 500 MG tablet Commonly known as: TYLENOL Take 500 mg by mouth every 8 (eight) hours as needed for moderate pain.   allopurinol 300 MG tablet Commonly known as: ZYLOPRIM Take 300 mg by mouth daily.   amLODipine 5 MG tablet Commonly known as: NORVASC Take 1 tablet (5 mg total) by mouth daily. Start taking on: December 26, 2021   aspirin EC 81 MG tablet Take 1 tablet (81 mg total) by mouth daily. Swallow whole.   atorvastatin 20 MG tablet Commonly known as: LIPITOR Take 1 tablet (20 mg total) by mouth 2 (two) times a week. What changed:  when to take this additional instructions   carvedilol 3.125 MG tablet Commonly known as: COREG Take 1 tablet (3.125 mg total) by mouth 2 (two) times daily with a meal.   cloNIDine 0.3 MG tablet Commonly known as: CATAPRES Take 0.6 mg by mouth 2 (two) times daily.   Entresto 97-103 MG Generic drug: sacubitril-valsartan Take 1 tablet by mouth 2 (two) times daily.   furosemide 20 MG tablet Commonly known as: LASIX Take 1 tablet (20 mg total) by mouth daily.   hydrochlorothiazide 25 MG tablet Commonly known as: HYDRODIURIL Take 25 mg by mouth daily.   metFORMIN 1000 MG tablet Commonly known as: GLUCOPHAGE Take 1 tablet (1,000 mg total) by mouth 2 (two) times daily with a meal.   pantoprazole 40 MG tablet Commonly known as: PROTONIX Take  40 mg by mouth daily.  Discharge Care Instructions  (From admission, onward)           Start     Ordered   12/25/21 0000  If the dressing is still on your incision site when you go home, remove it on the third day after your surgery date. Remove dressing if it begins to fall off, or if it is dirty or damaged before the third day.        12/25/21 1113             Outstanding Labs/Studies   BMET   Duration of Discharge Encounter   Greater than 30 minutes including physician time.  SignedKathyrn Drown, NP 12/25/2021, 11:13 AM [phone]   Patient seen, examined. Available data reviewed. Agree with findings, assessment, and plan as outlined by Kathyrn Drown, NP.  On my exam he is alert, oriented, in no distress.  JVP is normal, lung fields are clear, heart is regular rate and rhythm with a soft systolic ejection murmur along the left lower sternal border, abdomen is soft and nontender, right groin site is clear, left subclavian incision is clear, there is trace bilateral pretibial edema present.  The patient's postoperative echo is reviewed which shows normal transvalvular gradients and no paravalvular regurgitation demonstrating normal function of the transcatheter aortic valve.  LVEF is unchanged.  Acute on chronic systolic heart failure was treated with IV diuretics and the patient otherwise will be continued on his current medical regimen.  He is medically stable for discharge from the hospital today.  Post operative instructions are reviewed with him.  Valve clinic follow-up is arranged as above.  Sherren Mocha, M.D. 12/25/2021 12:14 PM

## 2021-12-25 NOTE — Progress Notes (Signed)
°  Echocardiogram 2D Echocardiogram has been performed.  Darlina Sicilian M 12/25/2021, 8:20 AM

## 2021-12-26 ENCOUNTER — Telehealth: Payer: Self-pay | Admitting: Cardiology

## 2021-12-26 MED FILL — Heparin Sodium (Porcine) Inj 1000 Unit/ML: Qty: 1000 | Status: AC

## 2021-12-26 MED FILL — Potassium Chloride Inj 2 mEq/ML: INTRAVENOUS | Qty: 40 | Status: AC

## 2021-12-26 MED FILL — Magnesium Sulfate Inj 50%: INTRAMUSCULAR | Qty: 10 | Status: AC

## 2021-12-26 NOTE — Telephone Encounter (Signed)
°  [facility] VALVE TEAM   Patient contacted regarding discharge from [facility] on 12/25/21   Patient understands to follow up with provider Kathyrn Drown, NP-C at the [place] location on 12/30/21. Patient understands discharge instructions? Yes Patient understands medications and regimen? Yes  Patient understands to bring all medications to this visit? Yes   Kathyrn Drown NP-C Structural Heart Team  Pager: [phone]

## 2021-12-27 NOTE — Progress Notes (Signed)
HEART AND [place]                                     Cardiology Office Note:    Date:  12/30/2021   ID:  Tia Gelb, DOB 1941-09-10, MRN [id number]  PCP:  Christain Sacramento, MD  [facility] Cardiologist: Dr. Terri Skains, MD / Dr. Burt Knack, MD & Dr. Cyndia Bent, MD (TAVR)  [facility] Electrophysiologist:  None   Referring MD: Christain Sacramento, MD   Chief Complaint  Patient presents with   Follow-up    S/p TAVR    History of Present Illness:    Nicholas Shelton is a 81 y.o. male with a hx of  hypertension, hyperlipidemia, non-insulin-dependent diabetes mellitus type 2, former tobacco use, new cardiomyopathy/systolic CHF with EF at 11%, and severe aortic stenosis  who presented to [facility] on 12/24/21 for planned TAVR.    Nicholas Shelton does not recall being told of a heart murmur until last year and was referred to Dr. Brennan Bailey team for monitoring of moderate aortic stenosis however  on a recent echocardiogram in 10/2021, his LVEF at 40% with global hypokinesis. Peak and mean transvalvular gradients are 48 and 30 mmHg, respectively. The dimensionless index is 0.25 and the calculated aortic valve area is 0.99 cm.  There was no other significant valvular disease identified. RV function is normal. Cardiac catheterization performed last year showed findings of widely patent coronary arteries with no significant obstruction.   He was evaluated by the multidisciplinary valve team, including Dr. Cyndia Bent and felt to have severe, symptomatic aortic stenosis and to be a suitable candidate   He underwent TAVR with 45mm S3UR on 12/24/21 via the subclavian approach on 12/24/21. Post operative echo with no AR or PVL. Aortic valve mean gradient measures 7.4 mmHg. Aortic valve peak gradient measures 13.4 mmHg. Aortic valve area, by VTI measures 2.70 cm.   Today he is here with a friend. He reports that he has been doing very well since discharge. He reports improved symptoms since  procedure with less exertional SOB. He is excited to get back to his exercise regimen of walking and light weight training. [place] site looks great with no oozing, redness, or ecchymosis. He denies chest pain, palpitations, dizziness, SOB, LE edema, orthopnea, or syncopal symptoms. He checks his BP daily and reports SBPs typically in the 120-130 range. He has not been taking the amlodipine started in the hospital therefore will remove this from his med list today.   Past Medical History:  Diagnosis Date   Actinic keratosis    Arthritis    AV block    Bradycardia    Degeneration of lumbar or lumbosacral intervertebral disc    Deviated nasal septum    Diabetes mellitus without complication (HCC)    Gastroesophageal reflux disease without esophagitis    Generalized anxiety disorder    Gout with tophus    Hearing loss    Hyperlipidemia    Hypertension    Insomnia    Murmur    Aortic stenosis   Nasal turbinate hypertrophy    Osteoarthritis of wrist    PAC (premature atrial contraction)    Peptic ulcer    Peripheral neuropathy    PVC (premature ventricular contraction)    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 Cyndia Bent  Seasonal allergic rhinitis due to pollen     Past Surgical History:  Procedure Laterality Date   COLONOSCOPY     ELBOW SURGERY     pinched nerve on both arms   INGUINAL HERNIA REPAIR Right 10/30/2018   Procedure: OPEN REPAIR OF INCARCERATED RIGHT INGUINAL HERNIA WITH MESH;  Surgeon: Greer Pickerel, MD;  Location: [place];  Service: General;  Laterality: Right;   LEFT HEART CATH AND CORONARY ANGIOGRAPHY N/A 01/15/2021   Procedure: LEFT HEART CATH AND CORONARY ANGIOGRAPHY;  Surgeon: Adrian Prows, MD;  Location: [place] [facility];  Service: Cardiovascular;  Laterality: N/A;   TEE WITHOUT CARDIOVERSION N/A 12/24/2021   Procedure: TRANSESOPHAGEAL ECHOCARDIOGRAM (TEE);  Surgeon: Sherren Mocha, MD;   Location: [place];  Service: Open Heart Surgery;  Laterality: N/A;   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   Procedure: TOTAL KNEE ARTHROPLASTY;  Surgeon: Sydnee Cabal, MD;  Location: [facility];  Service: Orthopedics;  Laterality: Right;  with adductor canal   WRIST SURGERY     pinched nerve    Current Medications: Current Meds  Medication Sig   acetaminophen (TYLENOL) 500 MG tablet Take 500 mg by mouth every 8 (eight) hours as needed for moderate pain.   amLODipine (NORVASC) 5 MG tablet Take 1 tablet (5 mg total) by mouth daily.   aspirin EC 81 MG tablet Take 1 tablet (81 mg total) by mouth daily. Swallow whole.   atorvastatin (LIPITOR) 20 MG tablet Take 1 tablet (20 mg total) by mouth 2 (two) times a week. (Patient taking differently: Take 20 mg by mouth See admin instructions. Take one tablet (20 mg) by mouth twice weekly - Monday and Thursday)   carvedilol (COREG) 3.125 MG tablet Take 1 tablet (3.125 mg total) by mouth 2 (two) times daily with a meal.   cloNIDine (CATAPRES) 0.3 MG tablet Take 0.6 mg by mouth 2 (two) times daily.    furosemide (LASIX) 20 MG tablet Take 1 tablet (20 mg total) by mouth daily.   hydrochlorothiazide (HYDRODIURIL) 25 MG tablet Take 25 mg by mouth daily.   metFORMIN (GLUCOPHAGE) 1000 MG tablet Take 1 tablet (1,000 mg total) by mouth 2 (two) times daily with a meal.   pantoprazole (PROTONIX) 40 MG tablet Take 40 mg by mouth daily.   sacubitril-valsartan (ENTRESTO) 97-103 MG Take 1 tablet by mouth 2 (two) times daily.   [DISCONTINUED] allopurinol (ZYLOPRIM) 300 MG tablet Take 300 mg by mouth daily.     Allergies:   Patient has no known allergies.   Social History   Socioeconomic History   Marital status: Married    Spouse name: Not on file   Number of children: 3   Years of education: Not on file   Highest education level: Not on file  Occupational History   Not on file  Tobacco Use   Smoking status: Former    Packs/day: 0.50    Years: 15.00     Pack years: 7.50    Types: Cigarettes    Quit date: 1996    Years since quitting: 27.1   Smokeless tobacco: Never  Vaping Use   Vaping Use: Never used  Substance and Sexual Activity   Alcohol use: No   Drug use: No   Sexual activity: Not on file    Comment: married  Other Topics Concern   Not on file  Social History Narrative   Not on file   Social Determinants of Health   Financial Resource Strain: Not on file  Food  Insecurity: Not on file  Transportation Needs: Not on file  Physical Activity: Not on file  Stress: Not on file  Social Connections: Not on file     Family History: The patient's family history includes Heart attack in his mother.  ROS:   Please see the history of present illness.    All other systems reviewed and are negative.  EKGs/Labs/Other Studies Reviewed:    The following studies were reviewed today:  TAVR OPERATIVE NOTE     Date of Procedure:                12/24/2021   Preoperative Diagnosis:      Severe Aortic Stenosis    Postoperative Diagnosis:    Same    Procedure:        Transcatheter Aortic Valve Replacement - Left Subclavian Artery Approach             Edwards Sapien 3  THV (size 29 mm, model # 9600TFX, serial # [id number])              Co-Surgeons:                        Gaye Pollack, MD and Sherren Mocha, MD       Anesthesiologist:                  Albertha Ghee, MD   Echocardiographer:              Marry Guan, MD   Pre-operative Echo Findings: Severe aortic stenosis Normal left ventricular systolic function   Post-operative Echo Findings: trace paravalvular leak Normal left ventricular systolic function  Echocardiogram 12/25/21:   1. 29 mm S3. Vmax 1.8 m/s, MG 7.4 mmHG, EOA 2.70 cm2, DI 0.51. No  regurgitation or paravalvular leak. The aortic valve has been  repaired/replaced. Aortic valve regurgitation is not visualized. There is  a 29 mm Sapien prosthetic (TAVR) valve present in  the aortic position. Procedure  Date: 12/24/2021. Echo findings are  consistent with normal structure and function of the aortic valve  prosthesis.   2. Left ventricular ejection fraction, by estimation, is 40 to 45%. The  left ventricle has mildly decreased function. The left ventricle  demonstrates global hypokinesis. The left ventricular internal cavity size  was mildly dilated. Left ventricular  diastolic parameters are consistent with Grade III diastolic dysfunction  (restrictive).   3. Right ventricular systolic function is normal. The right ventricular  size is mildly enlarged. There is mildly elevated pulmonary artery  systolic pressure. The estimated right ventricular systolic pressure is  76.5 mmHg.   4. Left atrial size was mildly dilated.   5. A small pericardial effusion is present. The pericardial effusion is  circumferential. There is no evidence of cardiac tamponade.   6. The mitral valve is degenerative. Trivial mitral valve regurgitation.  No evidence of mitral stenosis.   7. The inferior vena cava is dilated in size with >50% respiratory  variability, suggesting right atrial pressure of 8 mmHg.    EKG:  EKG is  ordered today.  The ekg ordered today demonstrates NSR with 1st degree AV block.   Recent Labs: 11/12/2021: Magnesium 1.9; NT-Pro BNP 1,918 12/20/2021: ALT 16; B Natriuretic Peptide 2,724.1 12/25/2021: BUN 25; Creatinine, Ser 1.29; Hemoglobin 9.3; Platelets 129; Potassium 3.1; Sodium 138  Recent Lipid Panel    Component Value Date/Time   CHOL 183 01/04/2021 0926   TRIG 138 01/04/2021 0926  HDL 41 01/04/2021 0926   LDLCALC 117 (H) 01/04/2021 0926   Physical Exam:    VS:  BP (!) 154/68    Pulse (!) 52    Ht 5\' 7"  (1.702 m)    Wt 184 lb (83.5 kg)    SpO2 99%    BMI 28.82 kg/m     Wt Readings from Last 3 Encounters:  12/30/21 184 lb (83.5 kg)  12/24/21 185 lb (83.9 kg)  12/20/21 183 lb 4.8 oz (83.1 kg)    General: Well developed, well nourished, NAD Neck: Negative for carotid bruits.  No JVD Lungs:Clear to ausculation bilaterally. No wheezes, rales, or rhonchi. Breathing is unlabored. Cardiovascular: RRR with S1 S2. Soft murmur Extremities: No edema.  Neuro: Alert and oriented. No focal deficits. No facial asymmetry. MAE spontaneously. Psych: Responds to questions appropriately with normal affect.    ASSESSMENT/PLAN:    Severe AS: s/p successful TAVR with a 29 mm Edwards Sapien 3 UR THV via the subclavian  approach on 12/24/21. Post operative echo with no AR or PVL. [place] site stable with no evidence of bleeding or oozing. ECG with NSR, 1st degree AV block and no high grade heart block. Continue ASA monotherapy>tolerating well. No need for SBE, has full dentures. Plan follow up in one month with echo. Can resume exercise regimen.    Hypertension: Patient noted to be markedly hypertensive during procedure. Looking back at prior OV notes, appears that his baseline is in the 160-190/70-90 range. Post TAVR SBPs remained in the 200 range despite IV NTG and IV hydralazine. Home antihypertensives were restarted and amlodipine was added to his regimen. BP today at 154/68>>reports SBPs at home in the 120-130 range despite not taking amlodipine therefore will take off med list. He will need to follow closely with Dr. Brennan Bailey team for further adjustments.     Hyperlipidemia: Last LDL, 117. Will need better control. Consider referral to lipid clinic.    Non-insulin-dependent diabetes mellitus type 2: Resumed on home medications. Follow with PCP    Non ischemic cardiomyopathy: Echocardiogram with EF at 40%. Continue Entresto and PO Lasix. See plan below    Acute on chronic systolic CHF: Pre TAVR BNP elevated with mild crackles on exam post procedure therefore he was treated with IV Lasix 40mg  x1. Appears euvolemic on exam today.    Hypokalemia: K+ 3.1 today. Replaced with KDUR 68meq. 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  Concerns regarding medicines are outlined above.  No orders of the defined types were placed in this encounter.  No orders of the defined types were placed in this encounter.   Patient Instructions  Medication Instructions:  Your physician has recommended you make the following change in your medication:  STOP AMLODIPINE  STOP ALLOPURINOL   *If you need a refill on your cardiac medications before your next appointment, please call your pharmacy*   Lab Work: TODAY: BMET If you have labs (blood work) drawn today and your tests are completely normal, you will receive your results only by: [place] (if you have MyChart) OR A paper copy in the mail If you have any lab test that is abnormal or we need to change your treatment, we will call you to review the results.   Testing/Procedures: NONE   Follow-Up: KEEP SCHEDULE FOLLOW-UP   Signed, Kathyrn Drown,  NP  12/30/2021 11:49 AM    [facility]

## 2021-12-30 ENCOUNTER — Ambulatory Visit: Payer: Medicare Other | Admitting: Cardiology

## 2021-12-30 ENCOUNTER — Other Ambulatory Visit: Payer: Self-pay

## 2021-12-30 VITALS — BP 154/68 | HR 52 | Ht 67.0 in | Wt 184.0 lb

## 2021-12-30 DIAGNOSIS — I350 Nonrheumatic aortic (valve) stenosis: Secondary | ICD-10-CM

## 2021-12-30 DIAGNOSIS — E782 Mixed hyperlipidemia: Secondary | ICD-10-CM | POA: Diagnosis not present

## 2021-12-30 DIAGNOSIS — Z952 Presence of prosthetic heart valve: Secondary | ICD-10-CM

## 2021-12-30 DIAGNOSIS — I5022 Chronic systolic (congestive) heart failure: Secondary | ICD-10-CM | POA: Diagnosis not present

## 2021-12-30 DIAGNOSIS — I10 Essential (primary) hypertension: Secondary | ICD-10-CM

## 2021-12-30 DIAGNOSIS — I428 Other cardiomyopathies: Secondary | ICD-10-CM

## 2021-12-30 LAB — BASIC METABOLIC PANEL
BUN/Creatinine Ratio: 26 — ABNORMAL HIGH (ref 10–24)
BUN: 25 mg/dL (ref 8–27)
CO2: 26 mmol/L (ref 20–29)
Calcium: 8.7 mg/dL (ref 8.6–10.2)
Chloride: 104 mmol/L (ref 96–106)
Creatinine, Ser: 0.95 mg/dL (ref 0.76–1.27)
Glucose: 127 mg/dL — ABNORMAL HIGH (ref 70–99)
Potassium: 4 mmol/L (ref 3.5–5.2)
Sodium: 142 mmol/L (ref 134–144)
eGFR: 80 mL/min/{1.73_m2} (ref >59)

## 2021-12-30 NOTE — Patient Instructions (Addendum)
Medication Instructions:  Your physician has recommended you make the following change in your medication:  STOP AMLODIPINE  STOP ALLOPURINOL   *If you need a refill on your cardiac medications before your next appointment, please call your pharmacy*   Lab Work: TODAY: BMET If you have labs (blood work) drawn today and your tests are completely normal, you will receive your results only by: [place] (if you have MyChart) OR A paper copy in the mail If you have any lab test that is abnormal or we need to change your treatment, we will call you to review the results.   Testing/Procedures: NONE   Follow-Up: KEEP SCHEDULE FOLLOW-UP

## 2021-12-31 LAB — MAGNESIUM: Magnesium: 1.8 mg/dL (ref 1.6–2.3)

## 2021-12-31 LAB — BASIC METABOLIC PANEL
BUN/Creatinine Ratio: 26 — ABNORMAL HIGH (ref 10–24)
BUN: 25 mg/dL (ref 8–27)
CO2: 26 mmol/L (ref 20–29)
Calcium: 9 mg/dL (ref 8.6–10.2)
Chloride: 104 mmol/L (ref 96–106)
Creatinine, Ser: 0.95 mg/dL (ref 0.76–1.27)
Glucose: 124 mg/dL — ABNORMAL HIGH (ref 70–99)
Potassium: 3.8 mmol/L (ref 3.5–5.2)
Sodium: 140 mmol/L (ref 134–144)
eGFR: 80 mL/min/{1.73_m2} (ref >59)

## 2021-12-31 LAB — PRO B NATRIURETIC PEPTIDE: NT-Pro BNP: 2011 pg/mL — ABNORMAL HIGH (ref 0–486)

## 2022-01-02 ENCOUNTER — Telehealth: Payer: Self-pay | Admitting: Cardiology

## 2022-01-02 NOTE — Telephone Encounter (Signed)
Attempted to call wife back, but no answer. Left message. Contacted Kathyrn Drown for guidance since ECHO states its a small pericardial effusion and pt is scheduled for repeat ECHO on 3/15 for post-TAVR evaluation.

## 2022-01-02 NOTE — Telephone Encounter (Signed)
Per Sharee Pimple Mcdaniel:  "Hi! Thank you so much. You can tell her that the effusion is small and we will watch this in his follow up echocardiogram. He was doing well when I saw him and this small effusion should not be causing any issue."   Spoke with wife and relayed above information. Wife thankful for call and states pt had gotten upset after hearing from Dr Brennan Bailey office (his primary cardiologist) that his BNP had increased and there was fluid around his heart. Pt will f/u with next months ECHO and has appt to see Tolia several days later and will discuss then.

## 2022-01-02 NOTE — Telephone Encounter (Signed)
Wife called in to the patient was told that he has fluid around his heart. Calling to see what needs to be done. Please advise

## 2022-01-03 ENCOUNTER — Ambulatory Visit: Payer: Medicare Other

## 2022-01-10 NOTE — Progress Notes (Signed)
Patient's sister in law called the office to go over all medications. MAR has been updated

## 2022-01-20 ENCOUNTER — Other Ambulatory Visit (HOSPITAL_COMMUNITY): Payer: Medicare Other

## 2022-01-22 ENCOUNTER — Ambulatory Visit: Payer: Medicare Other | Admitting: Physician Assistant

## 2022-01-22 ENCOUNTER — Other Ambulatory Visit: Payer: Self-pay

## 2022-01-22 ENCOUNTER — Other Ambulatory Visit: Payer: Self-pay | Admitting: Physician Assistant

## 2022-01-22 ENCOUNTER — Ambulatory Visit (HOSPITAL_COMMUNITY): Payer: Medicare Other | Attending: Cardiovascular Disease

## 2022-01-22 VITALS — BP 152/70 | HR 74 | Ht 71.0 in | Wt 183.0 lb

## 2022-01-22 DIAGNOSIS — I5022 Chronic systolic (congestive) heart failure: Secondary | ICD-10-CM

## 2022-01-22 DIAGNOSIS — Z952 Presence of prosthetic heart valve: Secondary | ICD-10-CM

## 2022-01-22 DIAGNOSIS — I10 Essential (primary) hypertension: Secondary | ICD-10-CM | POA: Diagnosis not present

## 2022-01-22 DIAGNOSIS — I350 Nonrheumatic aortic (valve) stenosis: Secondary | ICD-10-CM | POA: Diagnosis not present

## 2022-01-22 DIAGNOSIS — I428 Other cardiomyopathies: Secondary | ICD-10-CM

## 2022-01-22 NOTE — Progress Notes (Signed)
?[facility]ULAR [facility]ENTER   ?[place] ?                                    ?Cardiology Office Note:   ? ?Date:  01/22/2022  ? ?ID:  Nicholas Shelton, DOB 02/21/1941, MRN [id number] ? ?PCP:  Christain Sacramento, MD  ?[place] Cardiologist: Dr. Terri Skains, MD / Dr. Burt Knack, MD & Dr. Cyndia Bent, MD (TAVR)  ?[place] Electrophysiologist:  None  ? ?Referring MD: Christain Sacramento, MD  ? ?1 month s/p TAVR ? ?History of Present Illness:   ? ?Nicholas Shelton is a 81 y.o. male with a hx of  hypertension, hyperlipidemia, non-insulin-dependent diabetes mellitus type 2, former tobacco use, new cardiomyopathy/systolic CHF with EF at 74%, and severe aortic stenosis s/p TAVR (12/24/21) who presents to clinic for follow up.  ?  ?Nicholas Shelton does not recall being told of a heart murmur until last year and was referred to Dr. Brennan Bailey team for monitoring of aortic stenosis. Echo in 10/2021 showed LVEF at 40% with global hypokinesis and LFLG severe AS with a mean transvalvular gradients of 30 mmHg, DVI 0.25 and AVA 0.99 cm?. Cardiac catheterization performed previously in 01/2021 showed findings of widely patent coronary arteries with no significant obstruction. ?  ?He was evaluated by the multidisciplinary valve team and underwent successful TAVR with 54m S3UR on 12/24/21 via the subclavian approach on 12/24/21. Post operative echo  showed EF 40-45%, normally functioning TAVR with a mean gradient of 7.4 mmHg and no PVL as well as a small circumferential pericardial effusion. During his admission he had issues with marked hypertension. He was treated with IV hydralazine, IV NTG and IV labetalol. He was discharged on aspirin monotherapy as well as Norvasc.  ? ?At follow up his BP was okay and he reported not taking the Norvasc so it was taken off his list.  ? ?Today the patient presents to clinic for follow up. No CP or SOB. No LE edema, orthopnea or PND. No dizziness or syncope. No blood in stool or urine. No palpitations.  He  didn't have a lot of symptoms before TAVR and still feels good.   ? ? ? ?Past Medical History:  ?Diagnosis Date  ? Actinic keratosis   ? Arthritis   ? AV block   ? Bradycardia   ? Degeneration of lumbar or lumbosacral intervertebral disc   ? Deviated nasal septum   ? Diabetes mellitus without complication ([facility]ays   ? Gastroesophageal reflux disease without esophagitis   ? Generalized anxiety disorder   ? Gout with tophus   ? Hearing loss   ? Hyperlipidemia   ? Hypertension   ? Insomnia   ? Murmur   ? Aortic stenosis  ? Nasal turbinate hypertrophy   ? Osteoarthritis of wrist   ? PAC (premature atrial contraction)   ? Peptic ulcer   ? Peripheral neuropathy   ? PVC (premature ventricular contraction)   ? 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 with 214mS3AUR via [place] approach with Dr.Cooper and Dr. BaCyndia Bent? Seasonal allergic rhinitis due to pollen   ? ? ?Past Surgical History:  ?Procedure Laterality Date  ? COLONOSCOPY    ? ELBOW SURGERY    ? pinched nerve on both arms  ? INGUINAL HERNIA REPAIR Right 10/30/2018  ? Procedure: OPEN REPAIR OF INCARCERATED RIGHT INGUINAL HERNIA WITH MESH;  Surgeon: Greer Pickerel, MD;  Location: [place];  Service: General;  Laterality: Right;  ? LEFT HEART CATH AND CORONARY ANGIOGRAPHY N/A 01/15/2021  ? Procedure: LEFT HEART CATH AND CORONARY ANGIOGRAPHY;  Surgeon: Adrian Prows, MD;  Location: [place] [facility];  Service: Cardiovascular;  Laterality: N/A;  ? TEE WITHOUT CARDIOVERSION N/A 12/24/2021  ? Procedure: TRANSESOPHAGEAL ECHOCARDIOGRAM (TEE);  Surgeon: Sherren Mocha, MD;  Location: [place];  Service: Open Heart Surgery;  Laterality: N/A;  ? 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  ? Procedure: TOTAL KNEE ARTHROPLASTY;  Surgeon: Sydnee Cabal, MD;  [facility];  Service: Orthopedics;  Laterality: Right;  with adductor canal  ? WRIST SURGERY    ? pinched nerve  ? ? ?Current Medications: ?Current Meds  ?Medication Sig  ? acetaminophen (TYLENOL) 500 MG tablet Take  500 mg by mouth every 8 (eight) hours as needed for moderate pain.  ? aspirin EC 81 MG tablet Take 1 tablet (81 mg total) by mouth daily. Swallow whole.  ? atorvastatin (LIPITOR) 20 MG tablet Take 1 tablet (20 mg total) by mouth 2 (two) times a week. (Patient taking differently: Take 20 mg by mouth See admin instructions. Take one tablet (20 mg) by mouth twice weekly - Monday and Thursday)  ? carvedilol (COREG) 3.125 MG tablet Take 1 tablet (3.125 mg total) by mouth 2 (two) times daily with a meal.  ? cloNIDine (CATAPRES) 0.3 MG tablet Take 0.3 mg by mouth 2 (two) times daily.  ? hydrochlorothiazide (HYDRODIURIL) 25 MG tablet Take 25 mg by mouth daily.  ? metFORMIN (GLUCOPHAGE) 1000 MG tablet Take 1 tablet (1,000 mg total) by mouth 2 (two) times daily with a meal.  ? pantoprazole (PROTONIX) 40 MG tablet Take 40 mg by mouth daily.  ? sacubitril-valsartan (ENTRESTO) 97-103 MG Take 1 tablet by mouth 2 (two) times daily.  ?  ? ?Allergies:   Patient has no known allergies.  ? ?Social History  ? ?Socioeconomic History  ? Marital status: Married  ?  Spouse name: Not on file  ? Number of children: 3  ? Years of education: Not on file  ? Highest education level: Not on file  ?Occupational History  ? Not on file  ?Tobacco Use  ? Smoking status: Former  ?  Packs/day: 0.50  ?  Years: 15.00  ?  Pack years: 7.50  ?  Types: Cigarettes  ?  Quit date: 87  ?  Years since quitting: 27.2  ? Smokeless tobacco: Never  ?Vaping Use  ? Vaping Use: Never used  ?Substance and Sexual Activity  ? Alcohol use: No  ? Drug use: No  ? Sexual activity: Not on file  ?  Comment: married  ?Other Topics Concern  ? Not on file  ?Social History Narrative  ? Not on file  ? ?Social Determinants of Health  ? ?Financial Resource Strain: Not on file  ?Food Insecurity: Not on file  ?Transportation Needs: Not on file  ?Physical Activity: Not on file  ?Stress: Not on file  ?Social Connections: Not on file  ?  ? ?Family History: ?The patient's family history  includes Heart attack in his mother. ? ?ROS:   ?Please see the history of present illness.    ?All other systems reviewed and are negative. ? ?EKGs/Labs/Other Studies Reviewed:   ? ?The following studies were reviewed today: ? ?TAVR OPERATIVE NOTE ?  ?  ?Date of Procedure:  12/24/2021 ?  ?Preoperative Diagnosis:      Severe Aortic Stenosis  ?  ?Postoperative Diagnosis:    Same  ?  ?Procedure:      ?  ?Transcatheter Aortic Valve Replacement - Left Subclavian Artery Approach ?            Edwards Sapien 3  THV (size 29 mm, model # 9600TFX, serial # [id number]) ?             ?Co-Surgeons:                        Gaye Pollack, MD and Sherren Mocha, MD   ?  ?  ?Anesthesiologist:                  Albertha Ghee, MD ?  ?Echocardiographer:              Marry Guan, MD ?  ?Pre-operative Echo Findings: ?Severe aortic stenosis ?Normal left ventricular systolic function ?  ?Post-operative Echo Findings: ?trace paravalvular leak ?Normal left ventricular systolic function ? ? ? ?____________________ ? ?Echocardiogram 12/25/21: ? 1. 29 mm S3. Vmax 1.8 m/s, MG 7.4 mmHG, EOA 2.70 cm2, DI 0.51. No  ?regurgitation or paravalvular leak. The aortic valve has been  ?repaired/replaced. Aortic valve regurgitation is not visualized. There is  ?a 29 mm Sapien prosthetic (TAVR) valve present in  ?the aortic position. Procedure Date: 12/24/2021. Echo findings are  ?consistent with normal structure and function of the aortic valve  ?prosthesis.  ? 2. Left ventricular ejection fraction, by estimation, is 40 to 45%. The  ?left ventricle has mildly decreased function. The left ventricle  ?demonstrates global hypokinesis. The left ventricular internal cavity size  ?was mildly dilated. Left ventricular  ?diastolic parameters are consistent with Grade III diastolic dysfunction  ?(restrictive).  ? 3. Right ventricular systolic function is normal. The right ventricular  ?size is mildly enlarged. There is mildly elevated pulmonary artery   ?systolic pressure. The estimated right ventricular systolic pressure is  ?06.2 mmHg.  ? 4. Left atrial size was mildly dilated.  ? 5. A small pericardial effusion is present. The pericardial effusion is  ?circumfe

## 2022-01-22 NOTE — Patient Instructions (Signed)
Medication Instructions:  ?Your physician recommends that you continue on your current medications as directed. Please refer to the Current Medication list given to you today.  ?*If you need a refill on your cardiac medications before your next appointment, please call your pharmacy* ? ? ?Lab Work: ?NONE ?If you have labs (blood work) drawn today and your tests are completely normal, you will receive your results only by: ?MyChart Message (if you have MyChart) OR ?A paper copy in the mail ?If you have any lab test that is abnormal or we need to change your treatment, we will call you to review the results. ? ? ?Testing/Procedures: ?NONE ? ? ?Follow-Up: ?At [facility], you and your health needs are our priority.  As part of our continuing mission to provide you with exceptional heart care, we have created designated Provider Care Teams.  These Care Teams include your primary Cardiologist (physician) and Advanced Practice Providers (APPs -  Physician Assistants and Nurse Practitioners) who all work together to provide you with the care you need, when you need it. ? ?We recommend signing up for the patient portal called "MyChart".  Sign up information is provided on this After Visit Summary.  MyChart is used to connect with patients for Virtual Visits (Telemedicine).  Patients are able to view lab/test results, encounter notes, upcoming appointments, etc.  Non-urgent messages can be sent to your provider as well.   ?To learn more about what you can do with MyChart, go to [email or web address].   ? ?Your next appointment:   ?KEEP FOLLOW-UP APPOINTMENTS  ?

## 2022-01-23 LAB — ECHOCARDIOGRAM COMPLETE
AR max vel: 2.15 cm2
AV Area VTI: 1.97 cm2
AV Area mean vel: 1.9 cm2
AV Mean grad: 8.3 mmHg
AV Peak grad: 15.2 mmHg
Ao pk vel: 1.95 m/s
Area-P 1/2: 3.51 cm2
S' Lateral: 4.8 cm

## 2022-01-29 ENCOUNTER — Ambulatory Visit: Payer: Medicare Other | Admitting: Cardiology

## 2022-01-29 ENCOUNTER — Other Ambulatory Visit: Payer: Self-pay

## 2022-01-29 ENCOUNTER — Encounter: Payer: Self-pay | Admitting: Cardiology

## 2022-01-29 VITALS — BP 180/82 | HR 53 | Temp 97.6°F | Resp 16 | Ht 71.0 in | Wt 187.0 lb

## 2022-01-29 DIAGNOSIS — Z952 Presence of prosthetic heart valve: Secondary | ICD-10-CM

## 2022-01-29 DIAGNOSIS — E1165 Type 2 diabetes mellitus with hyperglycemia: Secondary | ICD-10-CM

## 2022-01-29 DIAGNOSIS — I5022 Chronic systolic (congestive) heart failure: Secondary | ICD-10-CM

## 2022-01-29 DIAGNOSIS — I10 Essential (primary) hypertension: Secondary | ICD-10-CM

## 2022-01-29 DIAGNOSIS — I350 Nonrheumatic aortic (valve) stenosis: Secondary | ICD-10-CM

## 2022-01-29 DIAGNOSIS — I428 Other cardiomyopathies: Secondary | ICD-10-CM

## 2022-01-29 DIAGNOSIS — E782 Mixed hyperlipidemia: Secondary | ICD-10-CM

## 2022-01-29 DIAGNOSIS — Z87891 Personal history of nicotine dependence: Secondary | ICD-10-CM

## 2022-01-29 MED ORDER — CLONIDINE HCL 0.2 MG PO TABS: 0.2000 mg | ORAL_TABLET | Freq: Two times a day (BID) | ORAL | 0 refills | Status: DC

## 2022-01-29 MED ORDER — ISOSORB DINITRATE-HYDRALAZINE 20-37.5 MG PO TABS: 1.0000 | ORAL_TABLET | Freq: Three times a day (TID) | ORAL | 0 refills | Status: DC

## 2022-01-29 NOTE — Progress Notes (Signed)
? ?ID:  Nicholas Shelton, DOB [birth year], MRN [id number] ? ?PCP:  Nicholas [place], MD  ?Cardiologist:  Nicholas Kras, DO, [facility] (established care 12/13/2020) ?Cardiac Electrophysiologist: Nicholas Shelton.  ? ?Date: 01/29/22 ?Last Office Visit: 10/31/2021 ? ?Chief Complaint  ?Patient presents with  ? Follow-up  ?  S/p TAVR  ? ? ?HPI  ?Nicholas Shelton is a 81 y.o. male whose past medical history and cardiovascular risk factors include: Hx of aortic stenosis s/p 81m Nicholas Shelton 3 Valve, hypertension, hyperlipidemia, non-insulin-dependent diabetes mellitus type 2, former smoker, advanced age. ? ?Patient is being followed by the practice for aortic stenosis.  He is remained relatively asymptomatic with good functional capacity.  However on follow-up echocardiograms to evaluate LVEF and progression of aortic stenosis he was noted to have newly reduced LVEF and therefore was referred to structural heart team for TAVR evaluation.  He underwent 29 mm Nicholas Shelton 3 valve implantation via the left subclavian approach on December 24, 2021.  Post operative echo  showed EF 40-45%, normally functioning TAVR with a mean gradient of 7.4 mmHg and no PVL as well as a small circumferential pericardial effusion.  ? ?He now presents for follow-up.  Accompanied by his sister-in-law Nicholas Kaufholdand patient provides verbal consent with regards to having her present during today's encounter.  Ms. Nicholas Edgettalso provides collateral history.  Since his transcatheter aortic valve replacement patient states that he feels better with regards to increase endurance and less short of breath.  However, patient's blood pressure at today's office visit is very uncontrolled.  Patient states that his home blood pressures are better controlled which raises suspicion for possible whitecoat hypertension.  I have asked him to enroll into principal care management for ambulatory blood pressure monitoring and he has been hesitant in the past.  Patient states that his blood  pressure today is elevated due to other stressors as his wife was recently diagnosed with stroke and son-in-law was involved in a motor vehicle accident and is quite serious. ? ?He denies orthopnea, paroxysmal nocturnal dyspnea or lower extremity swelling.  Recent labs from February 2023 independently reviewed which notes elevated NT proBNP in the setting of his reduced LVEF.  His medication titration has been difficult given his hearing difficulties and unwillingness to follow up on a regular basis for up titration of GDMT. ? ?ALLERGIES: ?No Known Allergies ? ?MEDICATION LIST PRIOR TO VISIT: ?Current Meds  ?Medication Sig  ? acetaminophen (TYLENOL) 500 MG tablet Take 500 mg by mouth every 8 (eight) hours as needed for moderate pain.  ? aspirin EC 81 MG tablet Take 1 tablet (81 mg total) by mouth daily. Swallow whole.  ? atorvastatin (LIPITOR) 20 MG tablet Take 1 tablet (20 mg total) by mouth 2 (two) times a week. (Patient taking differently: Take 20 mg by mouth See admin instructions. Take one tablet (20 mg) by mouth twice weekly - Monday and Thursday)  ? carvedilol (COREG) 3.125 MG tablet Take 1 tablet (3.125 mg total) by mouth 2 (two) times daily with a meal.  ? cloNIDine (CATAPRES) 0.2 MG tablet Take 1 tablet (0.2 mg total) by mouth 2 (two) times daily.  ? furosemide (LASIX) 20 MG tablet Take 1 tablet (20 mg total) by mouth daily.  ? hydrochlorothiazide (HYDRODIURIL) 25 MG tablet Take 25 mg by mouth daily.  ? isosorbide-hydrALAZINE (BIDIL) 20-37.5 MG tablet Take 1 tablet by mouth 3 (three) times daily.  ? metFORMIN (GLUCOPHAGE) 1000 MG tablet Take 1 tablet (1,000 mg total)  by mouth 2 (two) times daily with a meal.  ? pantoprazole (PROTONIX) 40 MG tablet Take 40 mg by mouth daily.  ? sacubitril-valsartan (ENTRESTO) 97-103 MG Take 1 tablet by mouth 2 (two) times daily.  ? [DISCONTINUED] cloNIDine (CATAPRES) 0.3 MG tablet Take 0.3 mg by mouth 2 (two) times daily.  ?  ? ?PAST MEDICAL HISTORY: ?Past Medical History:   ?Diagnosis Date  ? Actinic keratosis   ? Arthritis   ? AV block   ? Bradycardia   ? Degeneration of lumbar or lumbosacral intervertebral disc   ? Deviated nasal septum   ? Diabetes mellitus without complication (Nicholas Shelton)   ? Gastroesophageal reflux disease without esophagitis   ? Generalized anxiety disorder   ? Gout with tophus   ? Hearing loss   ? Hyperlipidemia   ? Hypertension   ? Insomnia   ? Murmur   ? Aortic stenosis  ? Nasal turbinate hypertrophy   ? Osteoarthritis of wrist   ? PAC (premature atrial contraction)   ? Peptic ulcer   ? Peripheral neuropathy   ? PVC (premature ventricular contraction)   ? 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 with 20m S3AUR via Nicholas Shelton approach with Nicholas Shelton and Dr. BCyndia Shelton ? Seasonal allergic rhinitis due to pollen   ? ? ?PAST SURGICAL HISTORY: ?Past Surgical History:  ?Procedure Laterality Date  ? COLONOSCOPY    ? ELBOW SURGERY    ? pinched nerve on both arms  ? INGUINAL HERNIA REPAIR Right 10/30/2018  ? Procedure: OPEN REPAIR OF INCARCERATED RIGHT INGUINAL HERNIA WITH MESH;  Surgeon: Nicholas Pickerel MD;  [facility]  Service: General;  Laterality: Right;  ? LEFT HEART CATH AND CORONARY ANGIOGRAPHY N/A 01/15/2021  ? Procedure: LEFT HEART CATH AND CORONARY ANGIOGRAPHY;  Surgeon: Nicholas Prows MD;  [facility];  Service: Cardiovascular;  Laterality: N/A;  ? TEE WITHOUT CARDIOVERSION N/A 12/24/2021  ? Procedure: TRANSESOPHAGEAL ECHOCARDIOGRAM (TEE);  Surgeon: Nicholas Mocha MD;  [place] [facility]  Service: Open Heart Surgery;  Laterality: N/A;  ? 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  ? Procedure: TOTAL KNEE ARTHROPLASTY;  Surgeon: CSydnee Cabal MD;  Location: [facility];  Service: Orthopedics;  Laterality: Right;  with adductor canal  ? WRIST SURGERY    ? pinched nerve  ? ? ?FAMILY HISTORY: ?The patient family history includes Heart attack in his mother. ? ?SOCIAL HISTORY:  ?The patient  reports that he quit smoking about 27 years ago. His  smoking use included cigarettes. He has a 7.50 pack-year smoking history. He has never used smokeless tobacco. He reports that he does not drink alcohol and does not use drugs. ? ?REVIEW OF SYSTEMS: ?Review of Systems  ?Constitutional: Negative for chills and fever.  ?HENT:  Negative for hoarse voice and nosebleeds.   ?     Difficulty hearing  ?Eyes:  Negative for discharge, double vision and pain.  ?Cardiovascular:  Negative for chest pain, claudication, dyspnea on exertion, leg swelling, near-syncope, orthopnea, palpitations, paroxysmal nocturnal dyspnea and syncope.  ?Respiratory:  Negative for hemoptysis and shortness of breath.   ?Musculoskeletal:  Negative for muscle cramps and myalgias.  ?Gastrointestinal:  Negative for abdominal pain, constipation, diarrhea, hematemesis, hematochezia, melena, nausea and vomiting.  ?Neurological:  Negative for dizziness and light-headedness.  ? ?PHYSICAL EXAM: ? ?  01/29/2022  ?  9:51 AM 01/29/2022  ?  9:46 AM 01/22/2022  ?  3:31 PM  ?Vitals with BMI  ?Height  '5\' 11"'$  '5\' 11"'$   ?  Weight  187 lbs 183 lbs  ?BMI  26.09 25.53  ?Systolic 606 004 599  ?Diastolic 82 65 70  ?Pulse 53 52 74  ? ?CONSTITUTIONAL: Well-developed and well-nourished. No acute distress.  ?SKIN: Skin is warm and dry. No rash noted. No cyanosis. No pallor. No jaundice ?HEAD: Normocephalic and atraumatic.  ?EYES: No scleral icterus ?MOUTH/THROAT: Moist oral membranes.  ?NECK: No JVD present. No thyromegaly noted. Bilateral carotid bruits  ?LYMPHATIC: No visible cervical adenopathy.  ?CHEST Normal respiratory effort. No intercostal retractions  ?LUNGS: Clear to auscultation bilaterally. No stridor. No wheezes. No rales.  ?CARDIOVASCULAR: Regular rate and rhythm, positive S1-S2,no murmurs, rubs or gallops appreciated. ?ABDOMINAL: Soft, nontender, nondistended, positive bowel sounds in all 4 quadrants, no apparent ascites.  ?EXTREMITIES: No peripheral edema, 2+ DP and PT pulses. ?HEMATOLOGIC: No significant  bruising ?NEUROLOGIC: Oriented to person, place, and time. Nonfocal. Normal muscle tone.  ?PSYCHIATRIC: Normal mood and affect. Normal behavior. Cooperative ? ?CARDIAC DATABASE: ?EKG: ?10/31/2021: Probable normal s

## 2022-01-30 ENCOUNTER — Other Ambulatory Visit: Payer: Self-pay

## 2022-01-30 ENCOUNTER — Telehealth: Payer: Self-pay

## 2022-01-30 DIAGNOSIS — I5022 Chronic systolic (congestive) heart failure: Secondary | ICD-10-CM

## 2022-01-30 DIAGNOSIS — I10 Essential (primary) hypertension: Secondary | ICD-10-CM

## 2022-01-30 NOTE — Telephone Encounter (Signed)
Lady called stating that pharmacy requiring Auth for med but didn't know name of med. Please call//ah ?

## 2022-01-31 ENCOUNTER — Telehealth: Payer: Self-pay | Admitting: Cardiology

## 2022-01-31 ENCOUNTER — Other Ambulatory Visit: Payer: Self-pay | Admitting: Pharmacist

## 2022-01-31 DIAGNOSIS — I5022 Chronic systolic (congestive) heart failure: Secondary | ICD-10-CM

## 2022-01-31 DIAGNOSIS — I10 Essential (primary) hypertension: Secondary | ICD-10-CM

## 2022-01-31 MED ORDER — HYDRALAZINE HCL 50 MG PO TABS: 50.0000 mg | ORAL_TABLET | Freq: Three times a day (TID) | ORAL | 3 refills | Status: DC

## 2022-01-31 MED ORDER — ISOSORBIDE DINITRATE 30 MG PO TABS: 30.0000 mg | ORAL_TABLET | Freq: Three times a day (TID) | ORAL | 3 refills | Status: DC

## 2022-01-31 NOTE — Telephone Encounter (Signed)
See message above

## 2022-01-31 NOTE — Telephone Encounter (Signed)
Not sure if this message goes along with the other message regarding medication questions but if you speak to patient do you mind asking them and just let me know if there is anything I can help with

## 2022-01-31 NOTE — Telephone Encounter (Signed)
Patient's wife says she would like a call back to discuss patient's new medication. She says it is too expensive. Can call her at the number listed. ?

## 2022-02-07 NOTE — Telephone Encounter (Signed)
Home BP improving since starting isosorbide 30 mg TID and hydralazine 50 mg TID. Home BP trending down to avg BP of 178/84 HR: 68. Pt tolerating medications without significant ADRs. Pt does note of mild headache, but tolerable. Recommended pt increase antihypertensive dose to isosorbide 60 mg TID and hydralazine 100 mg TID. Will follow up in 1 week to review home BP readings and medication tolerance.  ?

## 2022-02-13 NOTE — Telephone Encounter (Signed)
Called and discussed readings with pt, pt's wife, and pt's DIL. Pt reports that he has been tolerating the hydralazine 50 mg TID and isordil 30 mg TID dose without significant complains. Pt reports that complains of headaches have improved since switching from 2 tabs of BiDil 20/37.5 mg. Pt reports that concerns of headaches have improved significantly. Reports that concerns of vision changes has also improved. Pt notes that he has an appt with his ophthalmologist tomorrow. Home BP have been continuing to trend down. Pt denies any significant complains of HA, CP, SOB, numbness, weakness, edema. Recommended pt increase his hydralazine dose to 100 mg TID and increasing isordil 30 mg AM, PM and 60 mg HS. Pt also recommended to continue with clonidine dose decrease. Pt to start clonidine 0.1 mg BID for 1 week. Will continue close monitoring and follow up.  ?

## 2022-02-17 ENCOUNTER — Other Ambulatory Visit: Payer: Self-pay | Admitting: Pharmacist

## 2022-02-17 DIAGNOSIS — I5022 Chronic systolic (congestive) heart failure: Secondary | ICD-10-CM

## 2022-02-17 DIAGNOSIS — I10 Essential (primary) hypertension: Secondary | ICD-10-CM

## 2022-02-17 MED ORDER — HYDRALAZINE HCL 100 MG PO TABS: 100.0000 mg | ORAL_TABLET | Freq: Three times a day (TID) | ORAL | 2 refills | Status: DC

## 2022-02-17 NOTE — Telephone Encounter (Signed)
Pt's caregiver called stating that pt has been having persistent and severe headaches over the weekend since recent medication changes. Pt reports that headaches were severe enough this past weekend that he stated that he cant tolerate it anymore. Hasnt tried any remediation agent.  Pt recently increased his hydralazine dose from 50 mg TID to 100 mg TID. No changes in his isordil dose. Continues to staying consistent on 30 mg am, pm and 60  mg in the afternoon. Home BP have been continuing to trend down since recent changes. Avg BP over the past week of 157/74. Pt requesting refills for hydralazine. Refilled to pt's pharmacy. Recommended pt use PRN tylenol to help manage pt's headaches. Pt has his pill box set up for the week already. Recommended pt decrease his isordil dose to 30 mg BID. Pt continues to states that he has episodes of diplopia and concerns of blurry visions. Has a hx of cataracts. Pt scheduled to follow up with his ophthalmologist this Thursday  ?

## 2022-02-19 ENCOUNTER — Observation Stay (HOSPITAL_COMMUNITY)
Admission: EM | Admit: 2022-02-19 | Discharge: 2022-02-20 | Disposition: A | Discharge status: Home / Self Care | Payer: Medicare Other | Attending: Family Medicine | Admitting: Family Medicine

## 2022-02-19 ENCOUNTER — Observation Stay (HOSPITAL_COMMUNITY): Payer: Medicare Other

## 2022-02-19 ENCOUNTER — Other Ambulatory Visit: Payer: Self-pay

## 2022-02-19 ENCOUNTER — Emergency Department (HOSPITAL_COMMUNITY): Payer: Medicare Other

## 2022-02-19 ENCOUNTER — Encounter (HOSPITAL_COMMUNITY): Payer: Self-pay | Admitting: *Deleted

## 2022-02-19 DIAGNOSIS — I5022 Chronic systolic (congestive) heart failure: Secondary | ICD-10-CM | POA: Insufficient documentation

## 2022-02-19 DIAGNOSIS — Z87891 Personal history of nicotine dependence: Secondary | ICD-10-CM | POA: Diagnosis not present

## 2022-02-19 DIAGNOSIS — E785 Hyperlipidemia, unspecified: Secondary | ICD-10-CM | POA: Diagnosis not present

## 2022-02-19 DIAGNOSIS — E1142 Type 2 diabetes mellitus with diabetic polyneuropathy: Secondary | ICD-10-CM | POA: Insufficient documentation

## 2022-02-19 DIAGNOSIS — I110 Hypertensive heart disease with heart failure: Secondary | ICD-10-CM | POA: Diagnosis not present

## 2022-02-19 DIAGNOSIS — H9190 Unspecified hearing loss, unspecified ear: Secondary | ICD-10-CM | POA: Diagnosis not present

## 2022-02-19 DIAGNOSIS — I6523 Occlusion and stenosis of bilateral carotid arteries: Secondary | ICD-10-CM | POA: Diagnosis not present

## 2022-02-19 DIAGNOSIS — I671 Cerebral aneurysm, nonruptured: Secondary | ICD-10-CM

## 2022-02-19 DIAGNOSIS — Z953 Presence of xenogenic heart valve: Secondary | ICD-10-CM | POA: Diagnosis not present

## 2022-02-19 DIAGNOSIS — Z7902 Long term (current) use of antithrombotics/antiplatelets: Secondary | ICD-10-CM | POA: Insufficient documentation

## 2022-02-19 DIAGNOSIS — R9439 Abnormal result of other cardiovascular function study: Secondary | ICD-10-CM

## 2022-02-19 DIAGNOSIS — Z79899 Other long term (current) drug therapy: Secondary | ICD-10-CM | POA: Diagnosis not present

## 2022-02-19 DIAGNOSIS — Z7984 Long term (current) use of oral hypoglycemic drugs: Secondary | ICD-10-CM | POA: Insufficient documentation

## 2022-02-19 DIAGNOSIS — Z8249 Family history of ischemic heart disease and other diseases of the circulatory system: Secondary | ICD-10-CM | POA: Insufficient documentation

## 2022-02-19 DIAGNOSIS — H532 Diplopia: Secondary | ICD-10-CM | POA: Diagnosis not present

## 2022-02-19 DIAGNOSIS — Z9181 History of falling: Secondary | ICD-10-CM | POA: Insufficient documentation

## 2022-02-19 DIAGNOSIS — R29818 Other symptoms and signs involving the nervous system: Secondary | ICD-10-CM | POA: Diagnosis not present

## 2022-02-19 DIAGNOSIS — H543 Unqualified visual loss, both eyes: Secondary | ICD-10-CM | POA: Diagnosis not present

## 2022-02-19 DIAGNOSIS — I639 Cerebral infarction, unspecified: Secondary | ICD-10-CM | POA: Diagnosis not present

## 2022-02-19 DIAGNOSIS — Z7982 Long term (current) use of aspirin: Secondary | ICD-10-CM | POA: Insufficient documentation

## 2022-02-19 DIAGNOSIS — I672 Cerebral atherosclerosis: Secondary | ICD-10-CM | POA: Insufficient documentation

## 2022-02-19 DIAGNOSIS — F411 Generalized anxiety disorder: Secondary | ICD-10-CM | POA: Insufficient documentation

## 2022-02-19 DIAGNOSIS — K219 Gastro-esophageal reflux disease without esophagitis: Secondary | ICD-10-CM | POA: Diagnosis not present

## 2022-02-19 DIAGNOSIS — D500 Iron deficiency anemia secondary to blood loss (chronic): Secondary | ICD-10-CM | POA: Insufficient documentation

## 2022-02-19 DIAGNOSIS — I10 Essential (primary) hypertension: Secondary | ICD-10-CM | POA: Insufficient documentation

## 2022-02-19 DIAGNOSIS — I493 Ventricular premature depolarization: Secondary | ICD-10-CM | POA: Diagnosis not present

## 2022-02-19 DIAGNOSIS — E1165 Type 2 diabetes mellitus with hyperglycemia: Secondary | ICD-10-CM | POA: Diagnosis not present

## 2022-02-19 DIAGNOSIS — Z96651 Presence of right artificial knee joint: Secondary | ICD-10-CM | POA: Insufficient documentation

## 2022-02-19 DIAGNOSIS — H538 Other visual disturbances: Secondary | ICD-10-CM | POA: Insufficient documentation

## 2022-02-19 DIAGNOSIS — H34239 Retinal artery branch occlusion, unspecified eye: Secondary | ICD-10-CM | POA: Diagnosis present

## 2022-02-19 DIAGNOSIS — I6782 Cerebral ischemia: Secondary | ICD-10-CM | POA: Diagnosis not present

## 2022-02-19 DIAGNOSIS — H34232 Retinal artery branch occlusion, left eye: Principal | ICD-10-CM | POA: Insufficient documentation

## 2022-02-19 DIAGNOSIS — R1310 Dysphagia, unspecified: Secondary | ICD-10-CM | POA: Insufficient documentation

## 2022-02-19 DIAGNOSIS — I350 Nonrheumatic aortic (valve) stenosis: Secondary | ICD-10-CM | POA: Insufficient documentation

## 2022-02-19 DIAGNOSIS — M109 Gout, unspecified: Secondary | ICD-10-CM | POA: Insufficient documentation

## 2022-02-19 HISTORY — DX: Cerebral aneurysm, nonruptured: I67.1

## 2022-02-19 LAB — RAPID URINE DRUG SCREEN, HOSP PERFORMED
Amphetamines: NOT DETECTED
Barbiturates: NOT DETECTED
Benzodiazepines: NOT DETECTED
Cocaine: NOT DETECTED
Opiates: NOT DETECTED
Tetrahydrocannabinol: NOT DETECTED

## 2022-02-19 LAB — APTT: aPTT: 31 seconds (ref 24–36)

## 2022-02-19 LAB — URINALYSIS, ROUTINE W REFLEX MICROSCOPIC
Bacteria, UA: NONE SEEN
Bilirubin Urine: NEGATIVE
Glucose, UA: NEGATIVE mg/dL
Hgb urine dipstick: NEGATIVE
Ketones, ur: NEGATIVE mg/dL
Leukocytes,Ua: NEGATIVE
Nitrite: NEGATIVE
Protein, ur: >=300 mg/dL — AB
Specific Gravity, Urine: 1.011 (ref 1.005–1.030)
pH: 6 (ref 5.0–8.0)

## 2022-02-19 LAB — CBC
HCT: 38.1 % — ABNORMAL LOW (ref 39.0–52.0)
Hemoglobin: 12.6 g/dL — ABNORMAL LOW (ref 13.0–17.0)
MCH: 29 pg (ref 26.0–34.0)
MCHC: 33.1 g/dL (ref 30.0–36.0)
MCV: 87.6 fL (ref 80.0–100.0)
Platelets: 176 10*3/uL (ref 150–400)
RBC: 4.35 MIL/uL (ref 4.22–5.81)
RDW: 13 % (ref 11.5–15.5)
WBC: 4.7 10*3/uL (ref 4.0–10.5)
nRBC: 0 % (ref 0.0–0.2)

## 2022-02-19 LAB — DIFFERENTIAL
Abs Immature Granulocytes: 0.01 10*3/uL (ref 0.00–0.07)
Basophils Absolute: 0 10*3/uL (ref 0.0–0.1)
Basophils Relative: 1 %
Eosinophils Absolute: 0.1 10*3/uL (ref 0.0–0.5)
Eosinophils Relative: 1 %
Immature Granulocytes: 0 %
Lymphocytes Relative: 22 %
Lymphs Abs: 1 10*3/uL (ref 0.7–4.0)
Monocytes Absolute: 0.3 10*3/uL (ref 0.1–1.0)
Monocytes Relative: 7 %
Neutro Abs: 3.2 10*3/uL (ref 1.7–7.7)
Neutrophils Relative %: 69 %

## 2022-02-19 LAB — COMPREHENSIVE METABOLIC PANEL
ALT: 24 U/L (ref 0–44)
AST: 27 U/L (ref 15–41)
Albumin: 3.6 g/dL (ref 3.5–5.0)
Alkaline Phosphatase: 75 U/L (ref 38–126)
Anion gap: 9 (ref 5–15)
BUN: 21 mg/dL (ref 8–23)
CO2: 25 mmol/L (ref 22–32)
Calcium: 9.6 mg/dL (ref 8.9–10.3)
Chloride: 104 mmol/L (ref 98–111)
Creatinine, Ser: 0.99 mg/dL (ref 0.61–1.24)
GFR, Estimated: >60 mL/min (ref >60)
Glucose, Bld: 130 mg/dL — ABNORMAL HIGH (ref 70–99)
Potassium: 4.1 mmol/L (ref 3.5–5.1)
Sodium: 138 mmol/L (ref 135–145)
Total Bilirubin: 0.4 mg/dL (ref 0.3–1.2)
Total Protein: 6.8 g/dL (ref 6.5–8.1)

## 2022-02-19 LAB — LIPID PANEL
Cholesterol: 255 mg/dL — ABNORMAL HIGH (ref 0–200)
HDL: 62 mg/dL (ref >40)
LDL Cholesterol: 165 mg/dL — ABNORMAL HIGH (ref 0–99)
Total CHOL/HDL Ratio: 4.1 RATIO
Triglycerides: 139 mg/dL (ref <150)
VLDL: 28 mg/dL (ref 0–40)

## 2022-02-19 LAB — PROTIME-INR
INR: 1 (ref 0.8–1.2)
Prothrombin Time: 12.6 seconds (ref 11.4–15.2)

## 2022-02-19 LAB — HEMOGLOBIN A1C
Hgb A1c MFr Bld: 6.2 % — ABNORMAL HIGH (ref 4.8–5.6)
Mean Plasma Glucose: 131.24 mg/dL

## 2022-02-19 LAB — CBG MONITORING, ED: Glucose-Capillary: 88 mg/dL (ref 70–99)

## 2022-02-19 IMAGING — MR MR MRA HEAD W/O CM
1 series · 19 of 48 positions shown · non-contrast
Comparison: Comparison made with previous brain MRI from earlier
the same day.

CLINICAL DATA: Initial evaluation for neuro deficit, stroke
suspected, diplopia.

EXAM:
MRA HEAD WITHOUT CONTRAST
TECHNIQUE: Angiographic images of the Circle of Willis were acquired using MRA
technique without intravenous contrast.

[Series 5: 3d cow · axial · 0.5mm · 0.41mm/px · z∈[-108,-12]mm · 19 of 205 slices shown]
[im 1/205]
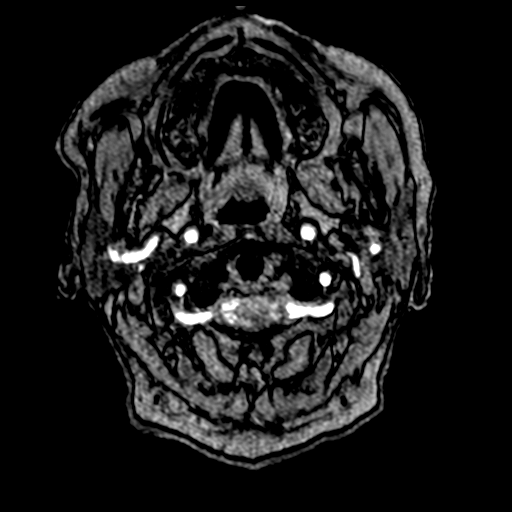
[im 5/205]
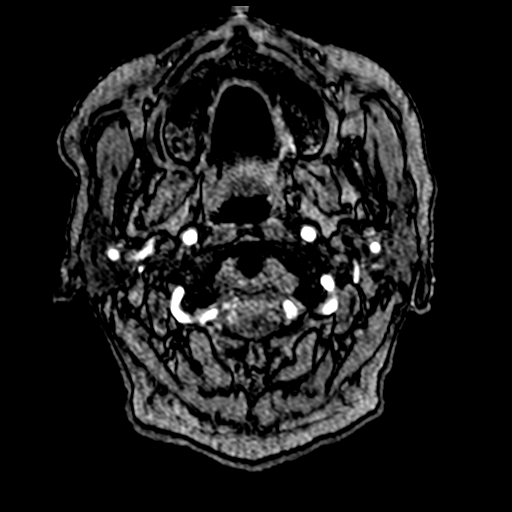
[im 9/205]
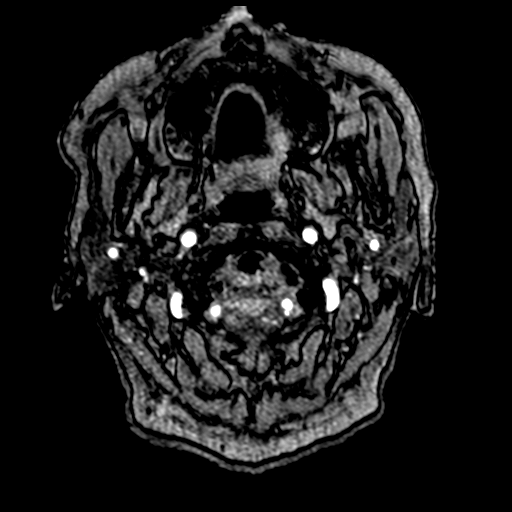
[im 14/205]
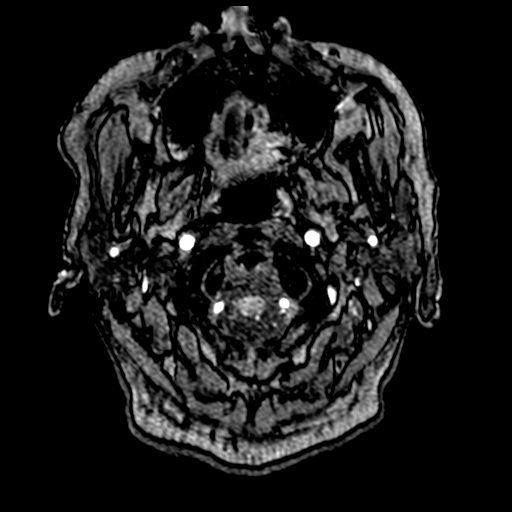
[im 18/205]
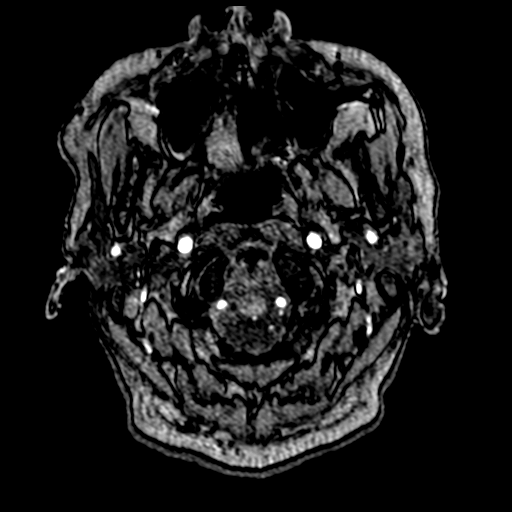
[im 22/205]
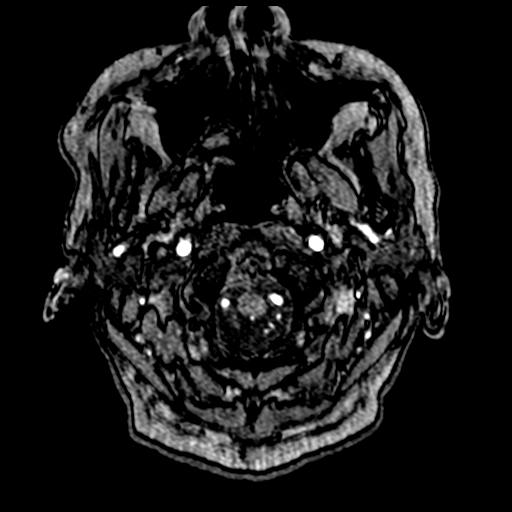
[im 27/205]
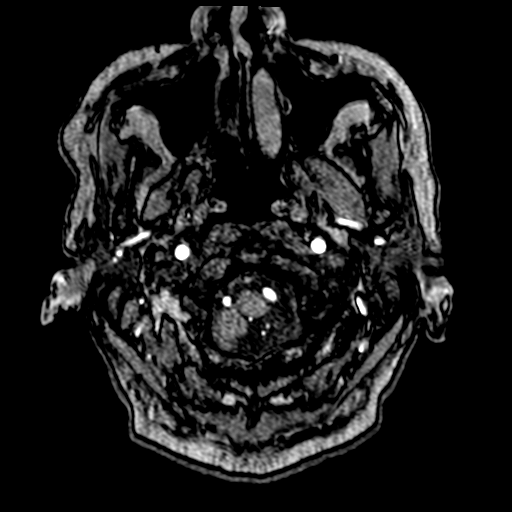
[im 31/205]
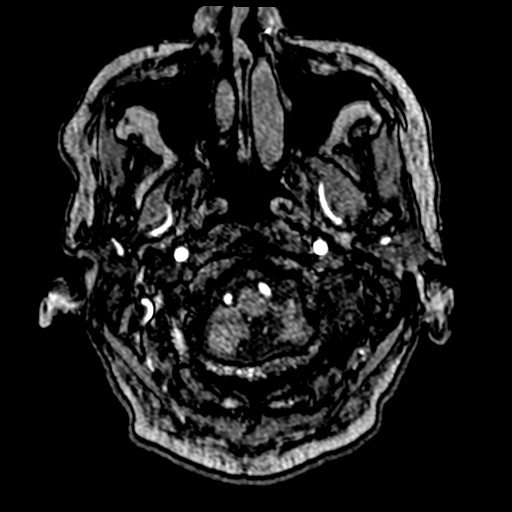
[im 35/205]
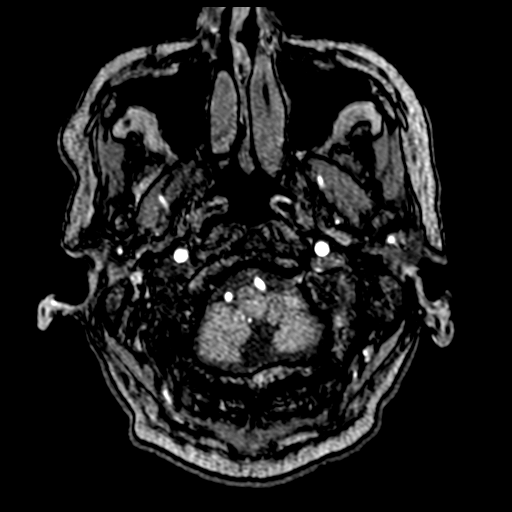
[im 40/205]
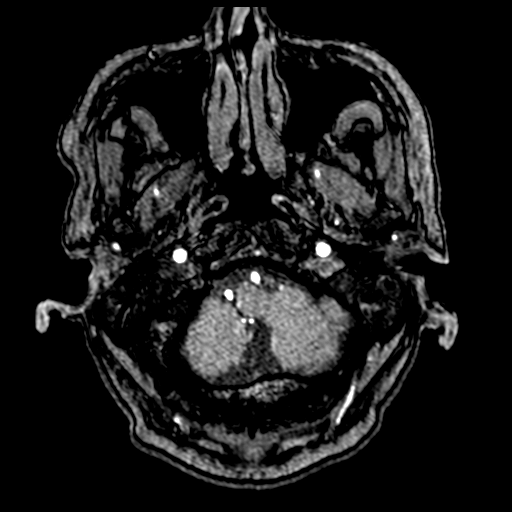
[im 44/205]
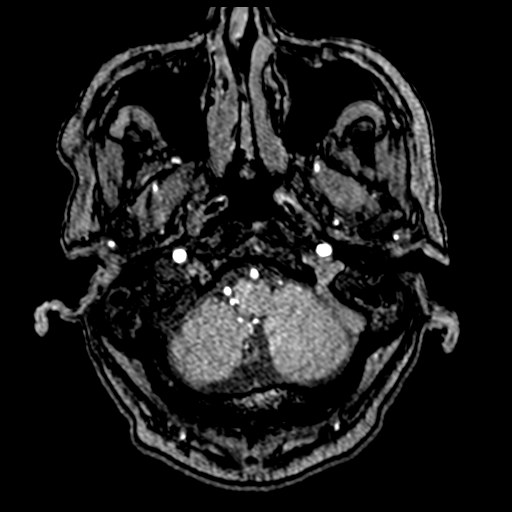
[im 66/205]
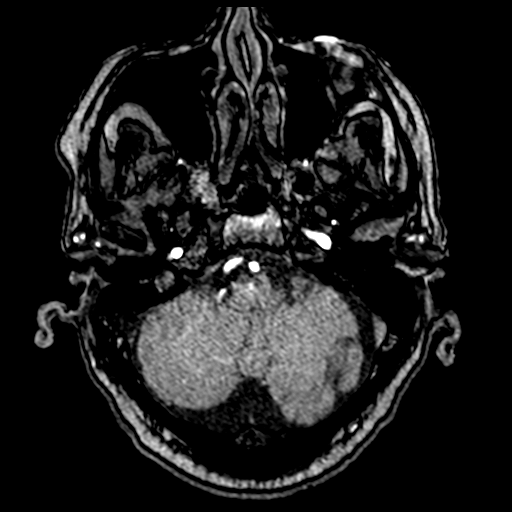
[im 92/205]
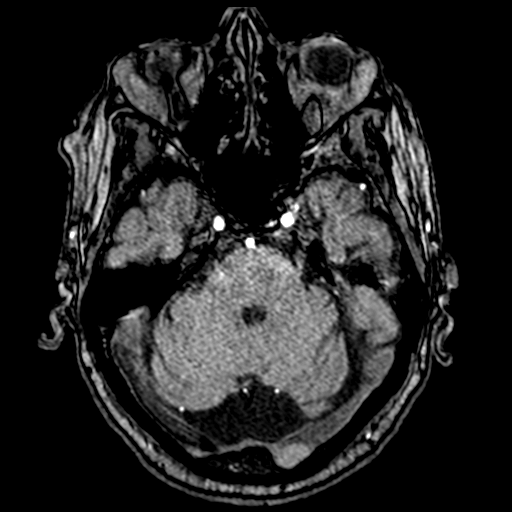
[im 105/205]
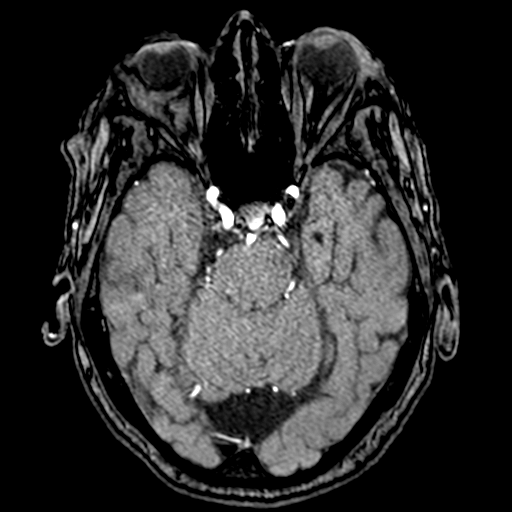
[im 118/205]
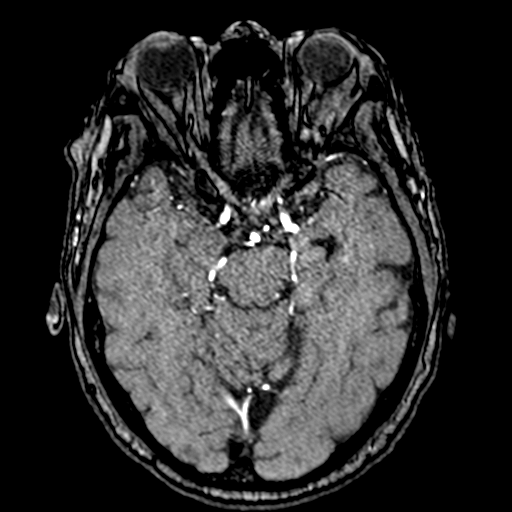
[im 144/205]
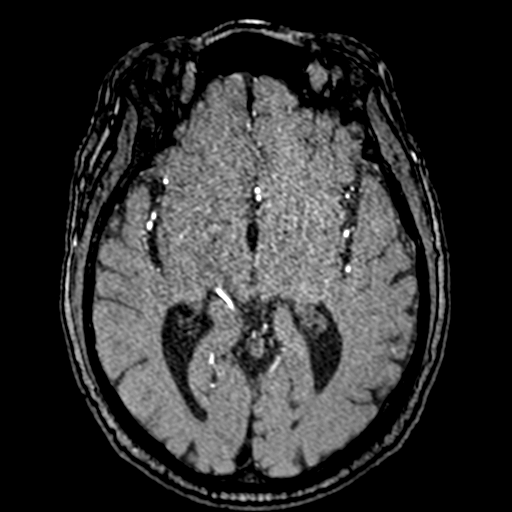
[im 170/205]
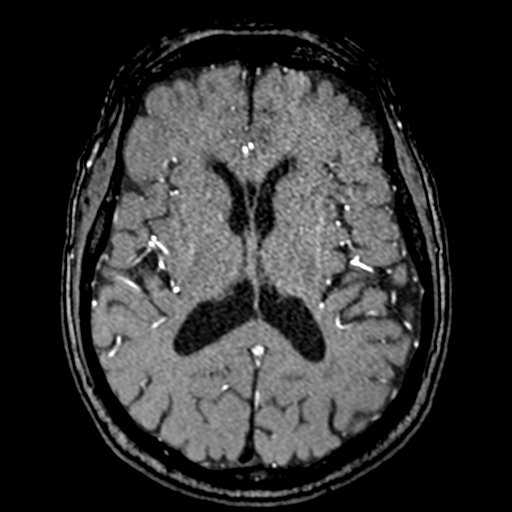
[im 174/205]
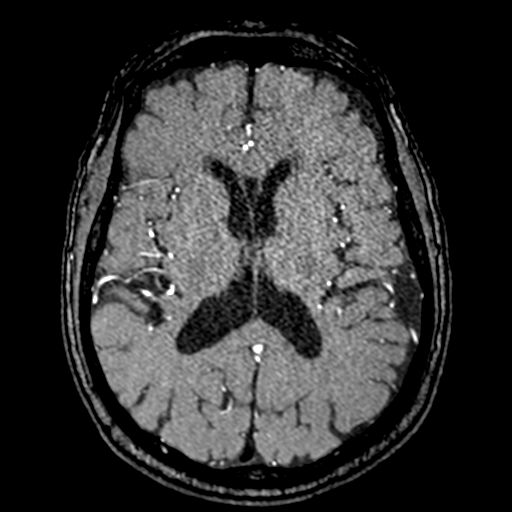
[im 196/205]
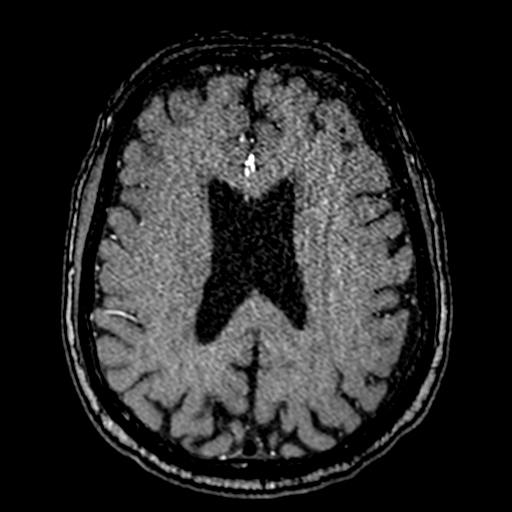

[19 of 48 positions shown; findings below may reference images not displayed]

FINDINGS: Anterior circulation: Visualized distal cervical segments of the
internal carotid arteries are widely patent with antegrade flow.
Petrous segments patent bilaterally. Mild atheromatous irregularity
within the carotid siphons without hemodynamically significant
stenosis. 4 mm saccular aneurysm extending laterally and posteriorly
from the supraclinoid left ICA consistent with a PCOM aneurysm
(series 5, image 117). Additional 2 mm outpouching extending
superiorly from the supraclinoid left ICA felt to be most consistent
with a vascular infundibulum related to the left ophthalmic artery.

A1 segments patent bilaterally. Normal anterior communicating artery
complex. Both ACAs mildly irregular but patent without significant
stenosis. No M1 stenosis or occlusion. Normal left MCA bifurcation.
Right MCA trifurcates. No proximal MCA branch occlusion. Distal MCA
branches well perfused and symmetric, although demonstrate small
vessel atheromatous irregularity.

Posterior circulation: Both V4 segments widely patent to the
vertebrobasilar junction without stenosis. Both PICA patent at their
origins. Basilar mildly irregular but patent to its distal aspect
without stenosis. Superior cerebellar arteries patent bilaterally.
Both PCAs primarily supplied via the basilar and are well perfused
to their distal aspects without flow-limiting stenosis. Distal small
vessel atheromatous irregularity.

Anatomic variants: None significant.

Other: None.
IMPRESSION: 1. Negative intracranial MRA for large vessel occlusion or other
emergent finding.
2. Mild intracranial atherosclerotic disease without hemodynamically
significant or correctable stenosis.
3. 4 mm saccular left PCOM aneurysm. This would likely be amendable
to endovascular treatment. Consultation with neuro interventional
radiology suggested.

## 2022-02-19 IMAGING — MR MR HEAD W/O CM
11 of 12 series · 43 of 48 positions shown · non-contrast
Comparison: None.

CLINICAL DATA: Neuro deficit, acute, stroke suspected. New BRAO.
Diplopia x 3 weeks.

EXAM:
MRI HEAD WITHOUT CONTRAST
TECHNIQUE: Multiplanar, multiecho pulse sequences of the brain and surrounding
structures were obtained without intravenous contrast.

[Series 5: DWI · axial · 3.0mm · 0.88mm/px · z∈[-102,+41]mm · 9 of 98 slices shown (1 of 4)]
[im 1/98]
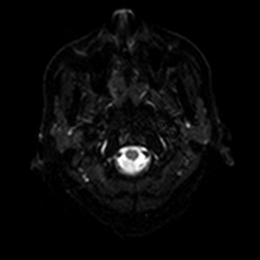
[im 13/98]
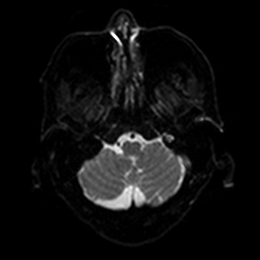
[im 25/98]
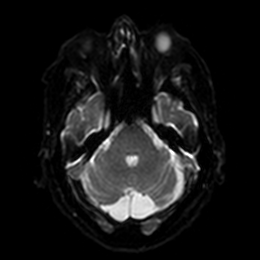
[im 37/98]
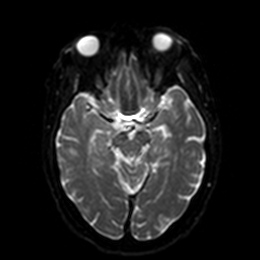
[im 49/98]
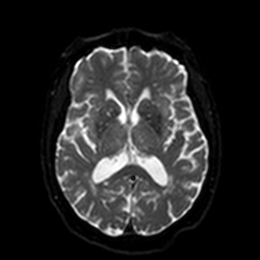
[im 61/98]
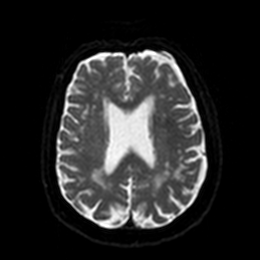
[im 73/98]
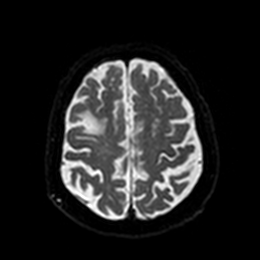
[im 85/98]
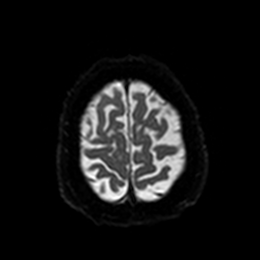
[im 98/98]
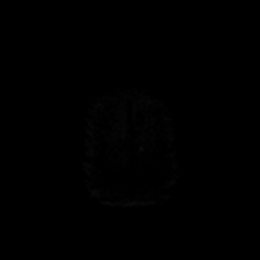

[Series 6: DWI · axial · 3.0mm · 0.88mm/px · z∈[-102,+41]mm · 4 of 49 slices shown (2 of 4)]
[im 1/49]
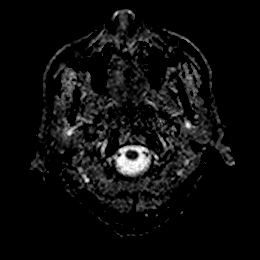
[im 17/49]
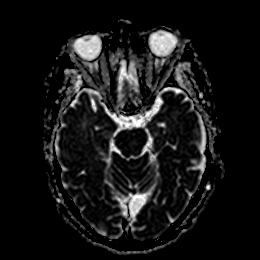
[im 33/49]
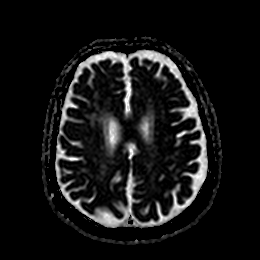
[im 49/49]
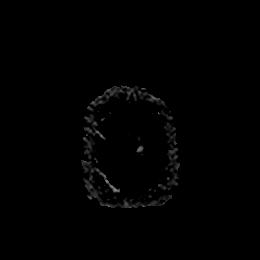

[Series 7: DWI · coronal · 4.0mm · 0.88mm/px · 6 of 66 slices shown (3 of 4)]
[im 1/66]
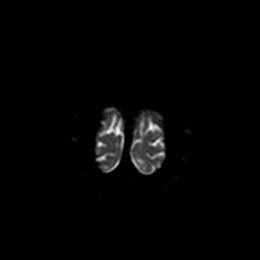
[im 14/66]
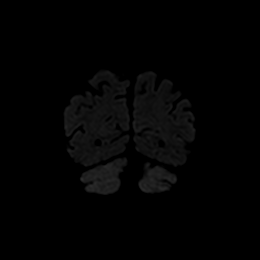
[im 27/66]
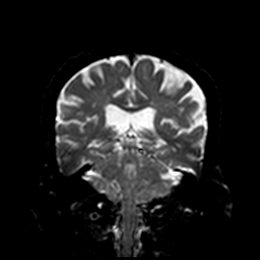
[im 40/66]
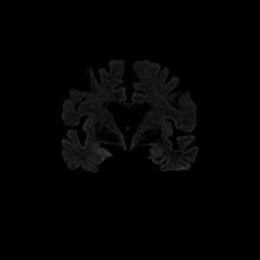
[im 53/66]
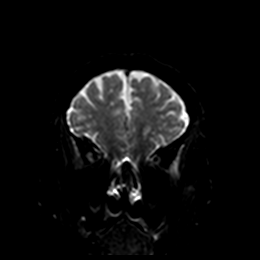
[im 66/66]
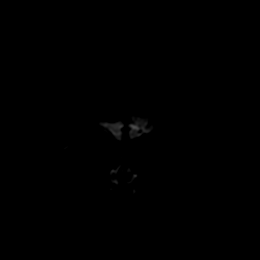

[Series 8: DWI · coronal · 4.0mm · 0.88mm/px · 3 of 33 slices shown (4 of 4)]
[im 1/33]
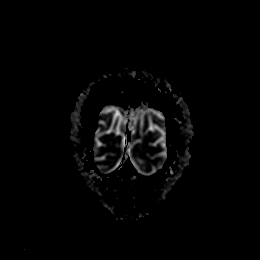
[im 17/33]
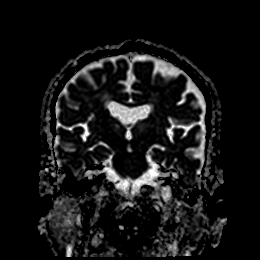
[im 33/33]
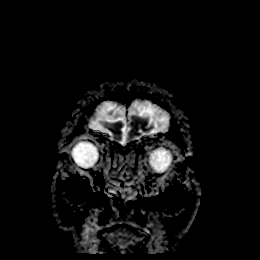

[Series 9: T1 · sagittal · 5.0mm · 0.75mm/px · 2 of 23 slices shown]
[im 1/23]
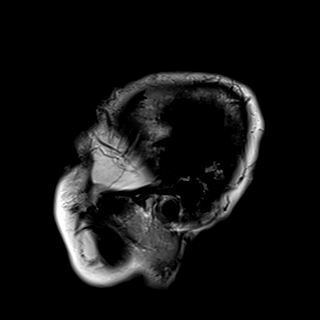
[im 23/23]
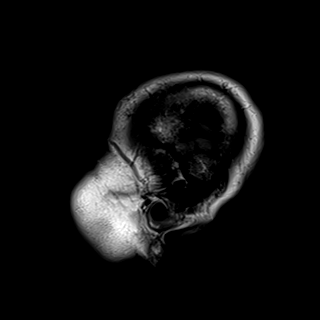

[Series 10: T2 · axial · 5.0mm · 0.72mm/px · z∈[-102,+41]mm · 2 of 25 slices shown (1 of 2)]
[im 1/25]
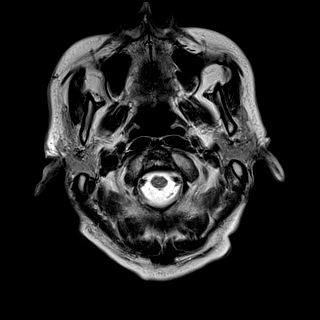
[im 25/25]
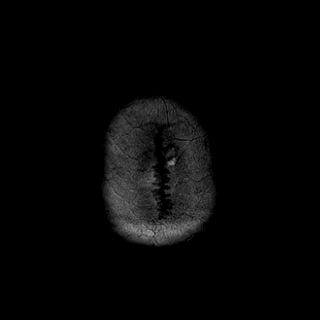

[Series 11: FLAIR · axial · 5.0mm · 0.45mm/px · z∈[-101,+43]mm · 2 of 25 slices shown]
[im 1/25]
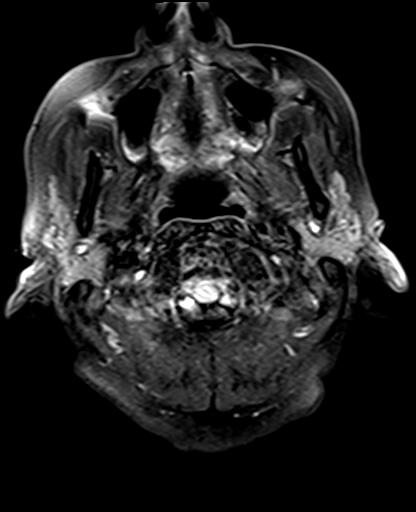
[im 25/25]
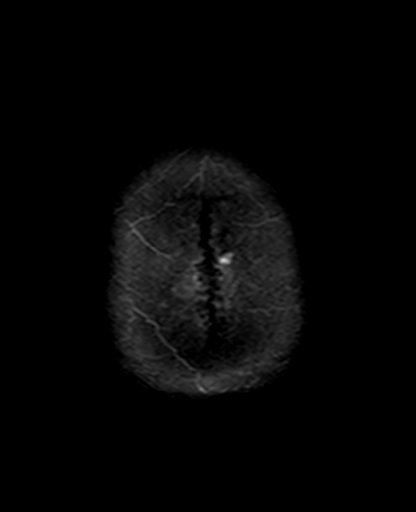

[Series 12: mag_images · axial · 3.0mm · 0.90mm/px · z∈[-99,+41]mm · 4 of 48 slices shown]
[im 1/48]
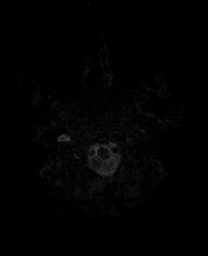
[im 16/48]
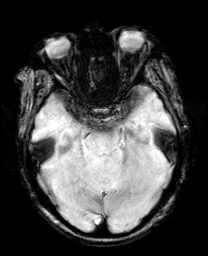
[im 32/48]
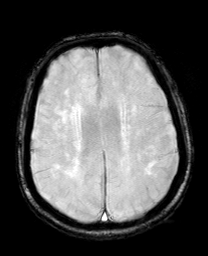
[im 48/48]
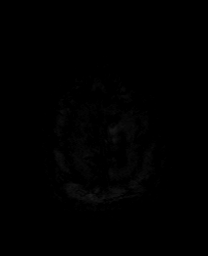

[Series 13: pha_images · axial · 3.0mm · 0.90mm/px · z∈[-96,+41]mm · 4 of 47 slices shown]
[im 1/47]
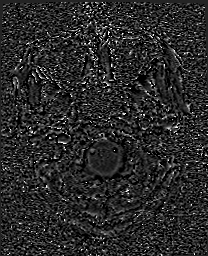
[im 16/47]
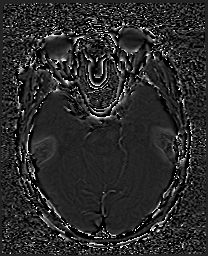
[im 31/47]
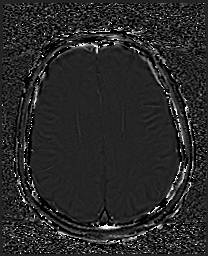
[im 47/47]
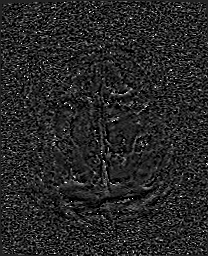

[Series 14: swi_images · axial · 3.0mm · 0.90mm/px · z∈[-99,+41]mm · 4 of 48 slices shown]
[im 1/48]
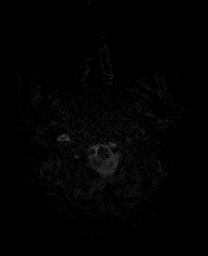
[im 16/48]
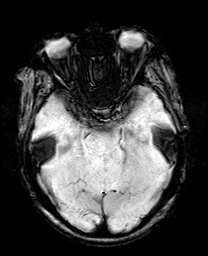
[im 32/48]
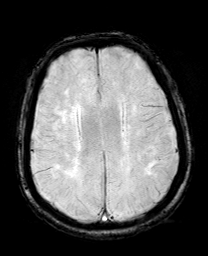
[im 48/48]
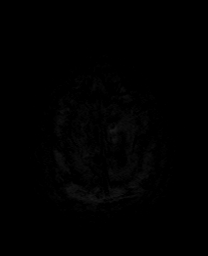

[Series 17: T2 · coronal · 5.0mm · 0.34mm/px · 3 of 29 slices shown (2 of 2)]
[im 1/29]
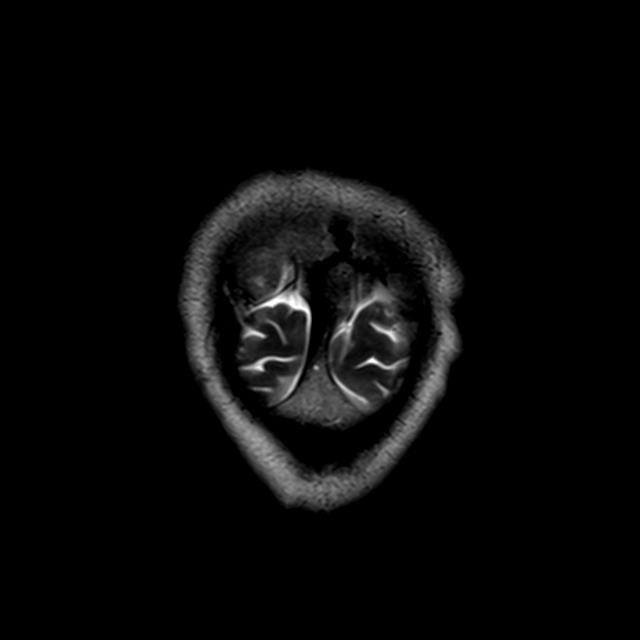
[im 15/29]
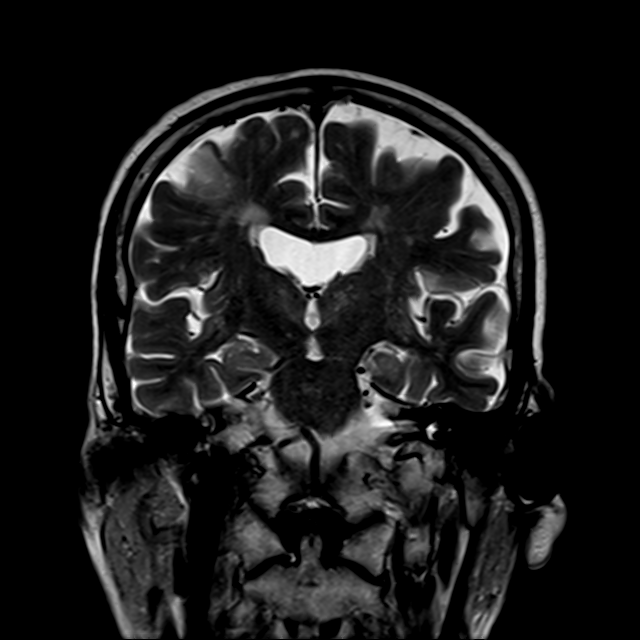
[im 29/29]
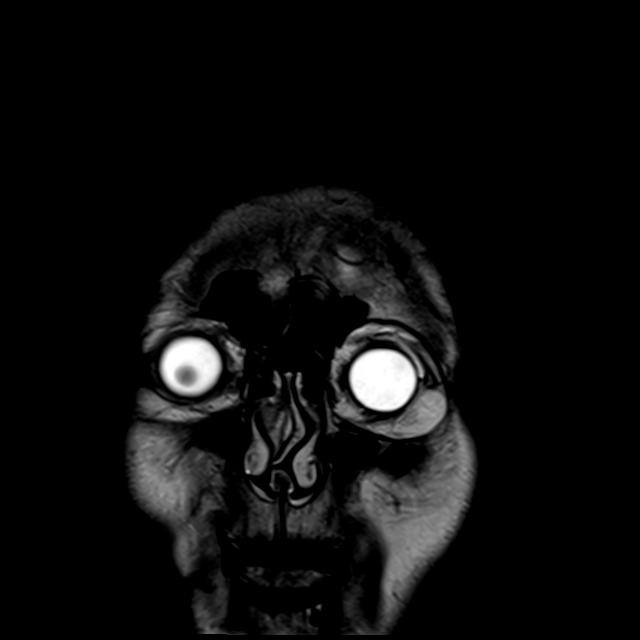

[43 of 48 positions shown; findings below may reference images not displayed]

FINDINGS: Brain: There is no evidence of an acute infarct, intracranial
hemorrhage, mass, midline shift, or extra-axial fluid collection.
Patchy T2 hyperintensities in the cerebral white matter bilaterally
are nonspecific but compatible with moderate chronic small vessel
ischemic disease. A small chronic cortical infarct is noted in the
right frontal lobe. There is mild cerebral atrophy. A mega cisterna
magna is incidentally noted, a normal variant. There is a small
chronic left cerebellar infarct.

Vascular: Major intracranial vascular flow voids are preserved.

Skull and upper cervical spine: Unremarkable bone marrow signal.

Sinuses/Orbits: Unremarkable orbits. Paranasal sinuses and mastoid
air cells are clear.

Other: None.
IMPRESSION: 1. No acute intracranial abnormality.
2. Moderate chronic small vessel ischemic disease.
3. Small chronic right frontal and left cerebellar infarcts.

## 2022-02-19 MED ORDER — ASPIRIN EC 81 MG PO TBEC
81.0000 mg | DELAYED_RELEASE_TABLET | Freq: Every day | ORAL | Status: DC
  Administered 2022-02-19: 81 mg via ORAL
  Filled 2022-02-19: qty 1

## 2022-02-19 MED ORDER — PANTOPRAZOLE SODIUM 40 MG PO TBEC
40.0000 mg | DELAYED_RELEASE_TABLET | Freq: Every day | ORAL | Status: DC
Start: 2022-02-19 — End: 2022-02-20
  Administered 2022-02-19 – 2022-02-20 (×2): 40 mg via ORAL
  Filled 2022-02-19 (×2): qty 1

## 2022-02-19 MED ORDER — CARVEDILOL 3.125 MG PO TABS
3.1250 mg | ORAL_TABLET | Freq: Two times a day (BID) | ORAL | Status: DC
  Administered 2022-02-20: 3.125 mg via ORAL
  Filled 2022-02-19: qty 1

## 2022-02-19 MED ORDER — FUROSEMIDE 20 MG PO TABS
20.0000 mg | ORAL_TABLET | Freq: Every day | ORAL | Status: DC
  Administered 2022-02-19 – 2022-02-20 (×2): 20 mg via ORAL
  Filled 2022-02-19 (×2): qty 1

## 2022-02-19 MED ORDER — SACUBITRIL-VALSARTAN 97-103 MG PO TABS
1.0000 | ORAL_TABLET | Freq: Two times a day (BID) | ORAL | Status: DC
  Administered 2022-02-20 (×2): 1 via ORAL
  Filled 2022-02-19 (×3): qty 1

## 2022-02-19 MED ORDER — ATORVASTATIN CALCIUM 10 MG PO TABS
20.0000 mg | ORAL_TABLET | ORAL | Status: DC
  Administered 2022-02-20: 20 mg via ORAL
  Filled 2022-02-19: qty 2

## 2022-02-19 MED ORDER — HYDROCHLOROTHIAZIDE 25 MG PO TABS
25.0000 mg | ORAL_TABLET | Freq: Every day | ORAL | Status: DC
  Administered 2022-02-20: 25 mg via ORAL
  Filled 2022-02-19: qty 1

## 2022-02-19 MED ORDER — HYDRALAZINE HCL 50 MG PO TABS
100.0000 mg | ORAL_TABLET | Freq: Three times a day (TID) | ORAL | Status: DC
  Administered 2022-02-19 – 2022-02-20 (×2): 100 mg via ORAL
  Filled 2022-02-19 (×2): qty 2

## 2022-02-19 MED ORDER — ISOSORBIDE DINITRATE 30 MG PO TABS
30.0000 mg | ORAL_TABLET | Freq: Two times a day (BID) | ORAL | Status: DC
  Administered 2022-02-20 (×2): 30 mg via ORAL
  Filled 2022-02-19 (×3): qty 1

## 2022-02-19 MED ORDER — ENOXAPARIN SODIUM 40 MG/0.4ML IJ SOSY
40.0000 mg | PREFILLED_SYRINGE | INTRAMUSCULAR | Status: DC
  Administered 2022-02-19: 40 mg via SUBCUTANEOUS
  Filled 2022-02-19: qty 0.4

## 2022-02-19 MED ORDER — METFORMIN HCL 500 MG PO TABS
1000.0000 mg | ORAL_TABLET | Freq: Two times a day (BID) | ORAL | Status: DC
Start: 2022-02-20 — End: 2022-02-20
  Administered 2022-02-20: 1000 mg via ORAL
  Filled 2022-02-19: qty 2

## 2022-02-19 MED ORDER — CLONIDINE HCL 0.1 MG PO TABS
0.1000 mg | ORAL_TABLET | Freq: Two times a day (BID) | ORAL | Status: DC
  Administered 2022-02-19 – 2022-02-20 (×2): 0.1 mg via ORAL
  Filled 2022-02-19 (×2): qty 1

## 2022-02-19 NOTE — Consult Note (Addendum)
NEUROLOGY CONSULTATION NOTE  ? ?Date of service: February 19, 2022 ?Patient Name: Nicholas Shelton ?MRN:  [id number] ?DOB:  [birth year] ?Reason for consult: "Branch retinal artery occlusion" ?Requesting Provider: Lenoria Chime, MD ?_ _ _   _ __   _ __ _ _  __ __   _ __   __ _ ? ?History of Present Illness  ?Nicholas Shelton is a 81 y.o. male with PMH significant for hypertension, hyperlipidemia, TAVR, gout, generalized anxiety disorder, diabetes, bioprosthetic aortic valve who presents with diplopia for 3 weeks. ? ?Patient reports that about 3 weeks ago, he woke up and noted he had double vision.  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 ? ?He was directed to the ED for further stroke work-up. ? ?He reports no prior history of strokes, no family history of strokes, reports his diabetes is well controlled and last A1c was 6.5, he monitors his blood pressure at home every day and it automatically goes to his PCP.  Last blood pressure was 150s by 70s at home.  He denies any history of hyperlipidemia.  He does not smoke, does not use any marijuana or other recreational substances.  He does not drink alcohol.  He denies any arm or leg weakness or numbness, no aphasia, no slurring of his speech, no vertigo, no gait imbalance.  He denies any headache. ? ?He had work-up here with MRI brain without contrast which was negative for an acute ischemic stroke, no ICH. ? ?LKW: 3 weeks ago ?mRS: 2 ?tNKASE: not offered, outside window ?Thrombectomy: Not offered, outside window. ?NIHSS components Score: Comment  ?1a Level of Conscious 0'[x]'$  1'[]'$  2'[]'$  3'[]'$      ?1b LOC Questions 0'[x]'$  1'[]'$  2'[]'$       ?1c LOC Commands 0'[x]'$  1'[]'$  2'[]'$       ?2 Best Gaze 0'[x]'$  1'[]'$  2'[]'$       ?3 Visual 0'[x]'$  1'[]'$  2'[]'$  3'[]'$      ?4 Facial Palsy 0'[x]'$  1'[]'$  2'[]'$  3'[]'$      ?5a Motor Arm - left 0'[x]'$  1'[]'$  2'[]'$  3'[]'$  4'[]'$  UN'[]'$    ?5b Motor Arm - Right 0'[x]'$  1'[]'$  2'[]'$  3'[]'$  4'[]'$  UN'[]'$    ?6a Motor Leg - Left 0'[x]'$  1'[]'$  2'[]'$  3'[]'$  4'[]'$   UN'[]'$    ?6b Motor Leg - Right 0'[x]'$  1'[]'$  2'[]'$  3'[]'$  4'[]'$  UN'[]'$    ?7 Limb Ataxia 0'[x]'$  1'[]'$  2'[]'$  3'[]'$  UN'[]'$     ?8 Sensory 0'[x]'$  1'[]'$  2'[]'$  UN'[]'$      ?9 Best Language 0'[x]'$  1'[]'$  2'[]'$  3'[]'$      ?10 Dysarthria 0'[x]'$  1'[]'$  2'[]'$  UN'[]'$      ?11 Extinct. and Inattention 0'[x]'$  1'[]'$  2'[]'$       ?TOTAL: 0   ? ?  ?ROS  ? ?Constitutional Denies weight loss, fever and chills.   ?HEENT Diplopia but no hearing loss.  ?Respiratory Denies SOB and cough.   ?CV Denies palpitations and CP   ?GI Denies abdominal pain, nausea, vomiting and diarrhea.   ?GU Denies dysuria and urinary frequency.   ?MSK Denies myalgia and joint pain.   ?Skin Denies rash and pruritus.   ?Neurological Denies headache and syncope.   ?Psychiatric Denies recent changes in mood. Denies anxiety and depression.   ? ?Past History  ? ?Past Medical History:  ?Diagnosis Date  ? Actinic keratosis   ? Arthritis   ? AV block   ? Bradycardia   ? Degeneration of lumbar or lumbosacral intervertebral disc   ? Deviated nasal septum   ?  Diabetes mellitus without complication ([place])   ? Gastroesophageal reflux disease without esophagitis   ? Generalized anxiety disorder   ? Gout with tophus   ? Hearing loss   ? Hyperlipidemia   ? Hypertension   ? Insomnia   ? Murmur   ? Aortic stenosis  ? Nasal turbinate hypertrophy   ? Osteoarthritis of wrist   ? PAC (premature atrial contraction)   ? Peptic ulcer   ? Peripheral neuropathy   ? PVC (premature ventricular contraction)   ? 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 with 8m S3AUR via [place] approach with Dr.Cooper and Dr. BCyndia Bent ? Seasonal allergic rhinitis due to pollen   ? ?Past Surgical History:  ?Procedure Laterality Date  ? COLONOSCOPY    ? ELBOW SURGERY    ? pinched nerve on both arms  ? INGUINAL HERNIA REPAIR Right 10/30/2018  ? Procedure: OPEN REPAIR OF INCARCERATED RIGHT INGUINAL HERNIA WITH MESH;  Surgeon: WGreer Pickerel MD;  [facility]  Service: General;  Laterality: Right;  ? LEFT HEART CATH AND CORONARY ANGIOGRAPHY N/A 01/15/2021  ?  Procedure: LEFT HEART CATH AND CORONARY ANGIOGRAPHY;  Surgeon: GAdrian Prows MD;  [facility];  Service: Cardiovascular;  Laterality: N/A;  ? TEE WITHOUT CARDIOVERSION N/A 12/24/2021  ? Procedure: TRANSESOPHAGEAL ECHOCARDIOGRAM (TEE);  Surgeon: CSherren Mocha MD;  [facility]  Service: Open Heart Surgery;  Laterality: N/A;  ? 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  ? Procedure: TOTAL KNEE ARTHROPLASTY;  Surgeon: CSydnee Cabal MD;  Location: [facility];  Service: Orthopedics;  Laterality: Right;  with adductor canal  ? WRIST SURGERY    ? pinched nerve  ? ?Family History  ?Problem Relation Age of Onset  ? Heart attack Mother   ? ?Social History  ? ?Socioeconomic History  ? Marital status: Married  ?  Spouse name: Not on file  ? Number of children: 3  ? Years of education: Not on file  ? Highest education level: Not on file  ?Occupational History  ? Not on file  ?Tobacco Use  ? Smoking status: Former  ?  Packs/day: 0.50  ?  Years: 15.00  ?  Pack years: 7.50  ?  Types: Cigarettes  ?  Quit date: 169 ?  Years since quitting: 27.2  ? Smokeless tobacco: Never  ?Vaping Use  ? Vaping Use: Never used  ?Substance and Sexual Activity  ? Alcohol use: No  ? Drug use: No  ? Sexual activity: Not on file  ?  Comment: married  ?Other Topics Concern  ? Not on file  ?Social History Narrative  ? Not on file  ? ?Social Determinants of Health  ? ?Financial Resource Strain: Not on file  ?Food Insecurity: Not on file  ?Transportation Needs: Not on file  ?Physical Activity: Not on file  ?Stress: Not on file  ?Social Connections: Not on file  ? ?No Known Allergies ? ?Medications  ?(Not in a hospital admission) ?  ? ?Vitals  ? ?Vitals:  ? 02/19/22 0950 02/19/22 1413 02/19/22 1730  ?BP: (!) 218/73 (!) 205/82 (!) 181/66  ?Pulse: 65 66 72  ?Resp: '16 16 15  '$ ?Temp: 98 ?F (36.7 ?C) 97.8 ?F (36.6 ?C)   ?TempSrc: Oral Oral   ?SpO2: 98% 97% 99%  ?  ? ?There is no height or weight on file to calculate BMI. ? ?Physical Exam   ? ?General: Laying comfortably in bed; in no acute distress.  ?HENT: Normal oropharynx  and mucosa. Normal external appearance of ears and nose.  ?Neck: Supple, no pain or tenderness  ?CV: No JVD. No peripheral edema.  ?Pulmonary: Symmetric Chest rise. Normal respiratory effort.  ?Abdomen: Soft to touch, non-tender.  ?Ext: No cyanosis, edema, or deformity  ?Skin: No rash. Normal palpation of skin.   ?Musculoskeletal: Normal digits and nails by inspection. No clubbing.  ? ?Neurologic Examination  ?Mental status/Cognition: Alert, oriented to self, place, month and year, good attention.  ?Speech/language: Fluent, comprehension intact, object naming intact, repetition intact.  ?Cranial nerves:  ? CN II Pupils equal and reactive to light, no VF deficits  ? CN III,IV,VI EOM intact, no gaze preference or deviation, no nystagmus. L upper eyelid encroaches on the pupil.  ? CN V normal sensation in V1, V2, and V3 segments bilaterally   ? CN VII no asymmetry, no nasolabial fold flattening   ? CN VIII Head of hearing but turns head towards speech  ? CN IX & X normal palatal elevation, no uvular deviation   ? CN XI 5/5 head turn and 5/5 shoulder shrug bilaterally   ? CN XII midline tongue protrusion   ? ?Motor:  ?Muscle bulk: normal, tone normal, pronator drift none tremor none ?Mvmt Root Nerve  Muscle Right Left Comments  ?SA C5/6 Ax Deltoid 5 5   ?EF C5/6 Mc Biceps 5 5   ?EE C6/7/8 Rad Triceps 5 5   ?WF C6/7 Med FCR     ?WE C7/8 PIN ECU     ?F Ab C8/T1 U ADM/FDI 5 5   ?HF L1/2/3 Fem Illopsoas 5 5   ?KE L2/3/4 Fem Quad 5 5   ?DF L4/5 D Peron Tib Ant 5 5   ?PF S1/2 Tibial Grc/Sol 5 5   ? ?Reflexes: ? Right Left Comments  ?Pectoralis     ? Biceps (C5/6) 2 2   ?Brachioradialis (C5/6) 2 2   ? Triceps (C6/7) 2 2   ? Patellar (L3/4) - 2 R knee replacement and pain.  ? Achilles (S1) 2 2   ? Hoffman     ? Plantar     ?Jaw jerk   ? ?Sensation: ? Light touch Intact throughout  ? Pin prick   ? Temperature   ? Vibration   ?Proprioception    ? ?Coordination/Complex Motor:  ?- Finger to Nose intact bilaterally ?- Heel to shin intact bilaterally ?- Rapid alternating movement are normal bilaterally. ?- Gait: Deferred for patient's safety. ?Labs  ? ?

## 2022-02-19 NOTE — ED Provider Notes (Signed)
?[facility] ?Provider Note ? ? ?CSN: [id number] ?Arrival date & time: 02/19/22  5329 ? ?  ? ?History ? ?Chief Complaint  ?Patient presents with  ? Blurred Vision  ? ? ?Nicholas Shelton is a 81 y.o. male. ? ?HPI ? ?Patient has history of hypertension, hyperlipidemia, diabetes, PVC, gastroesophageal reflux, peripheral neuropathy, status post transcatheter aortic valve replacement.  Patient presented ED for evaluation of double and blurred vision.  Symptoms ongoing for several weeks associated with headache.  Patient noted that when both eyes were open he was having blurred vision and double vision.  Patient was seen by an ophthalmologist today, Dr Manuella Ghazi and was diagnosed with a branch retinal artery occlusion.  Patient was sent to the for stroke evaluation ? ?Home Medications ?Prior to Admission medications   ?Medication Sig Start Date End Date Taking? Authorizing Provider  ?acetaminophen (TYLENOL) 500 MG tablet Take 500 mg by mouth every 8 (eight) hours as needed for moderate pain.    [provider]  ?aspirin EC 81 MG tablet Take 1 tablet (81 mg total) by mouth daily. Swallow whole. 01/03/21   Tolia, Sunit, DO  ?atorvastatin (LIPITOR) 20 MG tablet Take 1 tablet (20 mg total) by mouth 2 (two) times a week. ?Patient taking differently: Take 20 mg by mouth See admin instructions. Take one tablet (20 mg) by mouth twice weekly - Monday and Thursday 11/09/17   Deboraha Sprang, MD  ?carvedilol (COREG) 3.125 MG tablet Take 1 tablet (3.125 mg total) by mouth 2 (two) times daily with a meal. 11/06/17   Deboraha Sprang, MD  ?cloNIDine (CATAPRES) 0.2 MG tablet Take 1 tablet (0.2 mg total) by mouth 2 (two) times daily. ?Patient taking differently: Take 0.1 mg by mouth 2 (two) times daily. 01/29/22 04/29/22  Tolia, Sunit, DO  ?furosemide (LASIX) 20 MG tablet Take 1 tablet (20 mg total) by mouth daily. 12/06/21 01/29/22  Sherren Mocha, MD  ?hydrALAZINE (APRESOLINE) 100 MG tablet Take 1 tablet  (100 mg total) by mouth 3 (three) times daily. 02/17/22   Tolia, Sunit, DO  ?hydrochlorothiazide (HYDRODIURIL) 25 MG tablet Take 25 mg by mouth daily.    [provider]  ?isosorbide dinitrate (ISORDIL) 30 MG tablet Take 1 tablet (30 mg total) by mouth 3 (three) times daily. ?Patient taking differently: Take 30 mg by mouth 2 (two) times daily. 01/31/22   Tolia, Sunit, DO  ?metFORMIN (GLUCOPHAGE) 1000 MG tablet Take 1 tablet (1,000 mg total) by mouth 2 (two) times daily with a meal. 01/17/21   Adrian Prows, MD  ?pantoprazole (PROTONIX) 40 MG tablet Take 40 mg by mouth daily.    [provider]  ?sacubitril-valsartan (ENTRESTO) 97-103 MG Take 1 tablet by mouth 2 (two) times daily. 12/23/21 03/23/22  Rex Kras, DO  ?   ? ?Allergies    ?Patient has no known allergies.   ? ?Review of Systems   ?Review of Systems ? ?Physical Exam ?Updated Vital Signs ?BP (!) 181/66   Pulse 72   Temp 97.8 ?F (36.6 ?C) (Oral)   Resp 15   SpO2 99%  ?Physical Exam ?Vitals and nursing note reviewed.  ?Constitutional:   ?   General: He is not in acute distress. ?   Appearance: He is well-developed.  ?HENT:  ?   Head: Normocephalic and atraumatic.  ?   Right Ear: External ear normal.  ?   Left Ear: External ear normal.  ?Eyes:  ?   General: No scleral icterus.    ?  Right eye: No discharge.     ?   Left eye: No discharge.  ?   Conjunctiva/sclera: Conjunctivae normal.  ?Neck:  ?   Trachea: No tracheal deviation.  ?Cardiovascular:  ?   Rate and Rhythm: Normal rate and regular rhythm.  ?Pulmonary:  ?   Effort: Pulmonary effort is normal. No respiratory distress.  ?   Breath sounds: Normal breath sounds. No stridor. No wheezing or rales.  ?Abdominal:  ?   General: Bowel sounds are normal. There is no distension.  ?   Palpations: Abdomen is soft.  ?   Tenderness: There is no abdominal tenderness. There is no guarding or rebound.  ?Musculoskeletal:     ?   General: No tenderness.  ?   Cervical back: Neck supple.  ?Skin: ?   General:  Skin is warm and dry.  ?   Findings: No rash.  ?Neurological:  ?   Mental Status: He is alert and oriented to person, place, and time.  ?   Cranial Nerves: No cranial nerve deficit.  ?   Sensory: No sensory deficit.  ?   Motor: No abnormal muscle tone or seizure activity.  ?   Coordination: Coordination normal.  ?   Comments: No pronator drift bilateral upper extrem, able to hold both legs off bed for 5 seconds, sensation intact in all extremities, , no left or right sided neglect,unable to do finger to nose exam without covering one eye ? ?No facial droop, extraocular movements intact, tongue midline  ? ? ?ED Results / Procedures / Treatments   ?Labs ?(all labs ordered are listed, but only abnormal results are displayed) ?Labs Reviewed  ?CBC - Abnormal; Notable for the following components:  ?    Result Value  ? Hemoglobin 12.6 (*)   ? HCT 38.1 (*)   ? All other components within normal limits  ?COMPREHENSIVE METABOLIC PANEL - Abnormal; Notable for the following components:  ? Glucose, Bld 130 (*)   ? All other components within normal limits  ?URINALYSIS, ROUTINE W REFLEX MICROSCOPIC - Abnormal; Notable for the following components:  ? Protein, ur >=300 (*)   ? All other components within normal limits  ?PROTIME-INR  ?APTT  ?DIFFERENTIAL  ?RAPID URINE DRUG SCREEN, HOSP PERFORMED  ? ? ?EKG ?None ? ?Radiology ?MR BRAIN WO CONTRAST ? ?Result Date: 02/19/2022 ?CLINICAL DATA:  Neuro deficit, acute, stroke suspected. New BRAO. Diplopia x 3 weeks. EXAM: MRI HEAD WITHOUT CONTRAST TECHNIQUE: Multiplanar, multiecho pulse sequences of the brain and surrounding structures were obtained without intravenous contrast. COMPARISON:  None. FINDINGS: Brain: There is no evidence of an acute infarct, intracranial hemorrhage, mass, midline shift, or extra-axial fluid collection. Patchy T2 hyperintensities in the cerebral white matter bilaterally are nonspecific but compatible with moderate chronic small vessel ischemic disease. A small  chronic cortical infarct is noted in the right frontal lobe. There is mild cerebral atrophy. A mega cisterna magna is incidentally noted, a normal variant. There is a small chronic left cerebellar infarct. Vascular: Major intracranial vascular flow voids are preserved. Skull and upper cervical spine: Unremarkable bone marrow signal. Sinuses/Orbits: Unremarkable orbits. Paranasal sinuses and mastoid air cells are clear. Other: None. IMPRESSION: 1. No acute intracranial abnormality. 2. Moderate chronic small vessel ischemic disease. 3. 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 Electronically Signed   By: Logan Bores M.D.   On: 02/19/2022 11:50   ? ?Procedures ?Procedures  ? ? ?Medications Ordered in ED ?Medications  ?atorvastatin (LIPITOR) tablet 20  mg (has no administration in time range)  ?carvedilol (COREG) tablet 3.125 mg (has no administration in time range)  ?cloNIDine (CATAPRES) tablet 0.1 mg (has no administration in time range)  ?furosemide (LASIX) tablet 20 mg (has no administration in time range)  ?hydrALAZINE (APRESOLINE) tablet 100 mg (has no administration in time range)  ?isosorbide dinitrate (ISORDIL) tablet 30 mg (has no administration in time range)  ?metFORMIN (GLUCOPHAGE) tablet 1,000 mg (has no administration in time range)  ?pantoprazole (PROTONIX) EC tablet 40 mg (has no administration in time range)  ?sacubitril-valsartan (ENTRESTO) 97-103 mg per tablet (has no administration in time range)  ? ? ?ED Course/ Medical Decision Making/ A&P ?Clinical Course as of 02/19/22 1811  ?Wed Feb 19, 2022  ?1654 Comprehensive metabolic panel(!) ?Mild hyperglycemia [JK]  ?1654 CBC(!) ?Normal [JK]  ?1654 Urinalysis, Routine w reflex microscopic(!) ?Negative for infection [JK]  ?[facility] ?MRI shows chronic infarcts but no acute finding [JK]  ?1756 Case discussed with Dr Lorrin Goodell.   Recommends admission for full stroke work up. [JK]  ?63 Discussed with family medicine service  regarding admission [JK]  ?  ?Clinical Course User Index ?[JK] Dorie Rank, MD  ? ?                        ?Medical Decision Making ?Amount and/or Complexity of Data Reviewed ?Labs:  Decision-making detai

## 2022-02-19 NOTE — ED Triage Notes (Signed)
Pt sent here from eye dr for blurred vision x 3 weeks with headache. Reports that when both eyes are open, he has blurred vision and double vision. Vision improves when one eye is shut. No unilateral weakness noted.  ?

## 2022-02-19 NOTE — ED Provider Triage Note (Signed)
Emergency Medicine Provider Triage Evaluation Note ? ?[name] , a 81 y.o. male  was evaluated in triage.  Pt complains of diplopia, blurry vision. He has h/o HTN, diabetes, TAVR placement recently. On '81mg'$  ASA daily. Saw Dr. Manuella Ghazi with ophthalmology today. Diagnosed with BRAO right eye. Sent for stroke eval. Double vision is improved with one eye closed. Patient denies other signs of stroke including: facial droop, slurred speech, aphasia, weakness/numbness in extremities, imbalance/trouble walking. ? ? ?Review of Systems  ?Positive: Double vision ?Negative: HA ? ?Physical Exam  ?BP (!) 218/73 (BP Location: Left Arm)   Pulse 65   Temp 98 ?F (36.7 ?C) (Oral)   Resp 16   SpO2 98%  ?Gen:   Awake, no distress   ?Resp:  Normal effort  ?MSK:   Moves extremities without difficulty  ?Other:  Pupils dilated ? ?Medical Decision Making  ?Medically screening exam initiated at 9:57 AM.  Appropriate orders placed.  [name] was informed that the remainder of the evaluation will be completed by another provider, this initial triage assessment does not replace that evaluation, and the importance of remaining in the ED until their evaluation is complete. ? ? ?  ?Carlisle Cater, PA-C ?02/19/22 1572 ? ?

## 2022-02-19 NOTE — ED Notes (Addendum)
a 

## 2022-02-19 NOTE — H&P (Signed)
Family Medicine Teaching Service ?Hospital Admission History and Physical ?Service Pager: [phone] ? ?Patient name: Nicholas Shelton Medical record number: [id number] ?Date of birth: [birth year] Age: 81 y.o. Gender: male ? ?Primary Care Provider: Christain Sacramento, MD ?Consultants: Neurology  ?Code Status: Full   ?Preferred Emergency Contact: Sunil Hue [phone] ? ?Chief Complaint: Diploplia ? ?Assessment and Plan: ?Nicholas Shelton is a 81 y.o. 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 PMH is significant for TAVR with bioprosthetic valver, HTN, HFrEF, HLD, DM2, and GERD.  ? 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 Found to be markedly hypertensive to 218/73 on arrival, vitals otherwise WNL. Initial labwork largely unremarkable. 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 Neuro exam without abnormality aside from mild L sided ptosis which the patient reports has been present for years. Neurology was consulted by EDP and recommended further stroke workup.  ?- Admit to med-tele, Dr. Thompson Grayer attending ?- Stroke workup per neurology ? - MRA Head ? - Carotid US ? - Continue home ASA ? - Lipid panel ? - A1c ? - Cardiac monitoring to monitor for arrhythmia ? - No need for echo as patient had echo 01/22/2022 ?-PT/OT/SLP ?-Regular diet as patient has passed bedside swallow eval ?-Every 4 hour neurochecks ? ?Hypertension ?HFrEF  S/p TAVR ?Patient hypertensive to 218/73 on admission.  Patient is on many antihypertensives at home, has not had any of his home medications since 0830 on 4/11.  Given onset of symptoms 3 weeks ago, he is outside of the period of permissive  hypertension.   ?Last echo was 01/22/2022 showing LVEF 38-46%, grade 2 diastolic dysfunction, mild severe dilatation of left atrium, moderate dilatation of right atrium, degenerative mitral valve with mild regurgitation, status post TAVR.  No evidence of volume overload or heart failure exacerbation on exam. ?Home regimen includes Coreg 3.125 mg twice daily, clonidine 0.1 mg twice daily, hydralazine 100 mg 3 times daily, HCTZ 25 mg daily, isosorbide dinitrate 30 mg twice daily, Entresto 97-103 mg twice daily, Lasix 20 mg daily. ?-Restart home regimen ? -Coreg 3.125 mg twice daily ? -Clonidine 0.1 mg twice daily ? -Hydralazine 100 mg 3 times daily ? -HCTZ 25 mg daily ? -Isosorbide dinitrate 30 mg ? -Entresto 97-103 mg twice daily ? -Lasix 20 mg daily ? ?DM2 ?A1c 6.2%.  Patient is on metformin 1000 mg twice daily at home.  Not on insulin.  Glucose 130 on admission CMP. ?-CBG monitoring ?-Continue home metformin ?-No insulin for now, can initiate sliding scale if glucose running high ? ?Hyperlipidemia ?Patient takes atorvastatin 20 mg on Mondays and Thursdays.  Lipid panel pending. ?-Patient to get scheduled atorvastatin dose tomorrow ?-Follow-up lipid panel ? ?GERD ?-Continue home Protonix 40 mg daily ? ?FEN/GI: Regular diet ?Prophylaxis: Lovenox ? ?Disposition: admit to med tele, attending Dr. Thompson Grayer  ? ?History of Present Illness:  Nicholas Shelton is a 81 y.o. male presenting with diplopia for about 3 weeks. ?He first noticed symptoms while he was staining a wheelchair ramp that he had built for his wife.  He initially attributed the symptoms to noxious stimuli related to paint fumes but symptoms have persisted.  He notices them when both eyes are open.  He does not have symptoms if he covers one eye or another.  Was seen by ophthalmology earlier today who diagnosed him with a retinal artery branch occlusion. Denies chest pain, dyspnea, weakness, dizziness or other symptoms.  He did have a fall in his backyard about a week  and a half ago but attributes this to mechanical tripping getting off of his mower.  No other issues with balance/disequilibrium. ?Endorses an intermittent headache with some of his medication. Otherwise, he says that he feels good. He has not taken any medications since 4/11 at 8:30 am. Former smoker, quit in 1996. Denies alcohol consumption and substance use.  ? ?Review Of Systems: Per HPI with the following additions:  ? ?Review of Systems  ?Constitutional:  Negative for activity change and fever.  ?Eyes:  Positive for visual disturbance. Negative for pain.  ?Respiratory:  Negative for chest tightness and shortness of breath.   ?Gastrointestinal:  Negative for abdominal pain.  ?Neurological:  Positive for headaches. Negative for dizziness, seizures, syncope and numbness.   ? ?Patient Active Problem List  ? Diagnosis Date Noted  ? Retinal arterial branch occlusion 02/19/2022  ? Hypokalemia 12/25/2021  ? HTN (hypertension) 12/24/2021  ? HLD (hyperlipidemia) 12/24/2021  ? Type 2 diabetes mellitus with complication, without long-term current use of insulin ([place]) 12/24/2021  ? Hx of tobacco use, presenting hazards to health 12/24/2021  ? Nonischemic cardiomyopathy ([place]) 12/24/2021  ? Severe aortic stenosis 12/24/2021  ? 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 Acute on chronic combined systolic and diastolic CHF (congestive heart failure) ([place]) 12/24/2021  ? Osteoarthritis of right knee 02/06/2021  ? Abnormal nuclear stress test 01/14/2021  ? Preoperative cardiovascular examination: Left knee arthroplasty 01/23/2021 by Dr. Theda Sers 01/14/2021  ? Incarcerated inguinal hernia 10/30/2018  ? ? ?Past Medical History: ?Past Medical History:  ?Diagnosis Date  ? Actinic keratosis   ? Arthritis   ? AV block   ? Bradycardia   ? Degeneration of lumbar or lumbosacral intervertebral disc   ? Deviated nasal septum   ? Diabetes mellitus without complication ([place])   ? Gastroesophageal reflux disease without  esophagitis   ? Generalized anxiety disorder   ? Gout with tophus   ? Hearing loss   ? Hyperlipidemia   ? Hypertension   ? Insomnia   ? Murmur   ? Aortic stenosis  ? Nasal turbinate hypertrophy   ? Osteoarthritis of wrist   ? PAC (premature atrial contraction)   ? Peptic ulcer   ? Peripheral neuropathy   ? PVC (premature ventricular contraction)   ? 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 with 3m S3AUR via [place] approach with Dr.Cooper and Dr. BCyndia Bent ? Seasonal allergic rhinitis due to pollen   ? ? ?Past Surgical History: ?Past Surgical History:  ?Procedure Laterality Date  ? COLONOSCOPY    ? ELBOW SURGERY    ? pinched nerve on both arms  ? INGUINAL HERNIA REPAIR Right 10/30/2018  ? Procedure: OPEN REPAIR OF INCARCERATED RIGHT INGUINAL HERNIA WITH MESH;  Surgeon: WGreer Pickerel MD;  [facility]  Service: General;  Laterality: Right;  ? LEFT HEART CATH AND CORONARY ANGIOGRAPHY N/A 01/15/2021  ? Procedure: LEFT HEART CATH AND CORONARY ANGIOGRAPHY;  Surgeon: GAdrian Prows MD;  [facility];  Service: Cardiovascular;  Laterality: N/A;  ? TEE WITHOUT CARDIOVERSION N/A 12/24/2021  ? Procedure: TRANSESOPHAGEAL ECHOCARDIOGRAM (TEE);  Surgeon: CSherren Mocha MD;  [facility]  Service: Open Heart Surgery;  Laterality: N/A;  ? 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  ? Procedure: TOTAL KNEE ARTHROPLASTY;  Surgeon: Sydnee Cabal, MD;  Location: [facility];  Service: Orthopedics;  Laterality: Right;  with adductor canal  ? WRIST SURGERY    ? pinched nerve  ? ? ?Social History: ?Social History  ? ?Tobacco Use  ? Smoking status: Former  ?  Packs/day: 0.50  ?  Years: 15.00  ?  Pack years: 7.50  ?  Types: Cigarettes  ?  Quit date: 85  ?  Years since quitting: 27.2  ? Smokeless tobacco: Never  ?Vaping Use  ? Vaping Use: Never used  ?Substance Use Topics  ? Alcohol use: No  ? Drug use: No  ? ? ?Family History: ?Family History  ?Problem Relation Age of Onset  ? Heart attack Mother    ? ? ?Allergies and Medications: ?Allergies  ?Allergen Reactions  ? Trazodone And Nefazodone Other (See Comments)  ?  Sluggishness the next morning  ? ?No current facility-administered medications on file prior to en

## 2022-02-19 NOTE — ED Notes (Signed)
The pt has had visual disturbances in his lt eye over one week  he saw his eye doctor this am who sent him here for studies ?

## 2022-02-19 NOTE — ED Notes (Signed)
The pt passed his swallow screen ?

## 2022-02-19 NOTE — ED Notes (Signed)
P[t just returned from [facility] after he was there for over an hour ?

## 2022-02-19 NOTE — ED Notes (Signed)
Found pt standing besisde the bed all covers and sheets off the bed and his gown totally off sl unsteady on his feet  ?

## 2022-02-19 NOTE — Progress Notes (Addendum)
Interim Progress Note  ? ?S: Received page for admission for stroke work up given several weeks of ongoing blurred vision and diplopia. Patient with aortic stenosis s/p TAVR, HTN, HLD.   Patient states that he has had ongoing difficulties with his vision. It resolves when he covers each eye but present otherwise. He denies any further weakness, gait difficulties, speech changes.  States that his blood pressures are normally in the high 801K systolic, he has been working with his cardiologist to lower the numbers.  He has a headache which he attributes to his blood pressure medications and is not new.  He denies any shortness of breath, chest pain, abdominal pain.  Does have a recent fall history about 1 and half weeks ago but attributes this to tripping in his backyard. Has not taken any of his medications since yesterday.  ? ?Socially he is a former smoker, quit 1996,~7.5pack year smoker. No alcohol or other drug use.  ?Surg Hx: Hx TAVR in February 2023. Right total knee replacement 2022 ? ?O:  ?Blood pressure (!) 181/66, pulse 72, temperature 97.8 ?F (36.6 ?C), temperature source Oral, resp. rate 15, SpO2 99 %. ?Gen: Well appearing, in NAD, pleasant ?Resp: Normal respiratory effort ?Cards: RRR ?Neuro:  ?Cranial Nerves: ?II: Visual Fields are full. Pupils dilated and non-reactive to light ?III,IV, VI: EOMI without ptosis, +diplopia.  ?V: Facial sensation is symmetric to temperature ?VII: Facial movement is symmetric.  ?VIII: hearing is intact to voice ?X: Palate elevates symmetrically ?XI: Shoulder shrug is symmetric. ?XII: tongue is midline without atrophy or fasciculations.  ?Motor: ?Tone is normal. Bulk is normal. 5/5 strength was present in all four extremities.  ?Sensory: ?Sensation is symmetric to light touch and temperature  ? ?A/P ?Acute onset diplopia 2/2 retinal branch artery occlusion  ?Sent by ophthalmologist today. Neurology consulted in ED. Being admitted for further stroke work up. MRI thus far  without any acute changes.  ?-Will accept patient for admission ?-Full H&P to follow  ?

## 2022-02-19 NOTE — Hospital Course (Addendum)
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 His hospital course is detailed below: ? ?  ?Branch retinal Artery Occlusion ?Stroke Work-up ?Saccular left PCOM aneurysm ?Patient was found to have a branch retinal artery occlusion while at his ophthalmologist and was then found to be fairly hypertensive in the emergency department to the 200s/70s.  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  Neurology continue with a stroke work-up, obtaining restratification labs, MRA of the head, carotid ultrasound.  MRA found saccular left PCOM aneurysm, so IR was also consulted during this admission evaluate for possible endovascular treatment and decided to continue with a cerebral angiogram outpatient.  Carotid ultrasound showed***. Neurology recommended to continue with DAPT therapy, ASA + Plavix for 3 weeks and then continue with the Plavix alone.  ? ?Hypertension ?HFrEF (LVEF 35-40%, G2DD, mild dilatation of left atrium/moderate dilatation of right atrium)  S/p TAVR ?Home regimen of antihypertensives were started, and the patient appropriately decreased, became normotensive.  Continued home regimen: coreg 3.125 mg twice daily, clonidine 0.1 mg twice daily, hydralazine 100 mg 3 times daily, HCTZ 25 mg daily, isosorbide dinitrate 30 mg twice daily, Entresto 97-103 mg twice daily, Lasix 20 mg daily.  He remained euvolemic during his stay. ? ?Normocytic anemia, chronic ?Hemoglobin down to 11.7 during this admission.  Iron panel unremarkable from 1 year ago, can work-up further outpatient if indicated. ? ?Hyperlipidemia ?Lipid  panel showed an LDL of 165 that is greater than goal of <70.  Patient declined increasing dosage of his atorvastatin twice weekly 20 mg inpatient and wanted to speak to his cardiologist about this. ? ?Other chronic conditions were medically managed with home medications and formulary alternatives as necessary. ? ?PCP Follow-up Recommendations: ?Patient declined increase in Statin. Discussed our recommendations given his elevated LDL but he preferred to discuss with his cardiologist outpatient. Consider continued discussion and increase to high-intensity statin with goal of LDL<70 ?Repeat CBC on discharge  ?Ensure appropriate f/u with IR for cerebral angiogram  ?DAPT Therapy: ASA + Plavix x 3wks then Plavix alone  ?

## 2022-02-20 ENCOUNTER — Observation Stay (HOSPITAL_BASED_OUTPATIENT_CLINIC_OR_DEPARTMENT_OTHER): Payer: Medicare Other

## 2022-02-20 ENCOUNTER — Other Ambulatory Visit (HOSPITAL_COMMUNITY): Payer: Self-pay

## 2022-02-20 DIAGNOSIS — H34239 Retinal artery branch occlusion, unspecified eye: Secondary | ICD-10-CM | POA: Diagnosis not present

## 2022-02-20 DIAGNOSIS — H34232 Retinal artery branch occlusion, left eye: Secondary | ICD-10-CM | POA: Diagnosis not present

## 2022-02-20 DIAGNOSIS — I10 Essential (primary) hypertension: Secondary | ICD-10-CM

## 2022-02-20 DIAGNOSIS — E785 Hyperlipidemia, unspecified: Secondary | ICD-10-CM | POA: Diagnosis not present

## 2022-02-20 LAB — CBC
HCT: 35.6 % — ABNORMAL LOW (ref 39.0–52.0)
Hemoglobin: 11.7 g/dL — ABNORMAL LOW (ref 13.0–17.0)
MCH: 28.9 pg (ref 26.0–34.0)
MCHC: 32.9 g/dL (ref 30.0–36.0)
MCV: 87.9 fL (ref 80.0–100.0)
Platelets: 161 10*3/uL (ref 150–400)
RBC: 4.05 MIL/uL — ABNORMAL LOW (ref 4.22–5.81)
RDW: 13 % (ref 11.5–15.5)
WBC: 5.6 10*3/uL (ref 4.0–10.5)
nRBC: 0 % (ref 0.0–0.2)

## 2022-02-20 LAB — BASIC METABOLIC PANEL
Anion gap: 6 (ref 5–15)
BUN: 24 mg/dL — ABNORMAL HIGH (ref 8–23)
CO2: 26 mmol/L (ref 22–32)
Calcium: 9 mg/dL (ref 8.9–10.3)
Chloride: 108 mmol/L (ref 98–111)
Creatinine, Ser: 1.04 mg/dL (ref 0.61–1.24)
GFR, Estimated: >60 mL/min (ref >60)
Glucose, Bld: 130 mg/dL — ABNORMAL HIGH (ref 70–99)
Potassium: 3.8 mmol/L (ref 3.5–5.1)
Sodium: 140 mmol/L (ref 135–145)

## 2022-02-20 LAB — CBG MONITORING, ED: Glucose-Capillary: 113 mg/dL — ABNORMAL HIGH (ref 70–99)

## 2022-02-20 MED ORDER — ATORVASTATIN CALCIUM 80 MG PO TABS: 80.0000 mg | ORAL_TABLET | ORAL | Status: DC

## 2022-02-20 MED ORDER — ASPIRIN EC 81 MG PO TBEC: 81.0000 mg | DELAYED_RELEASE_TABLET | Freq: Every day | ORAL | Status: AC

## 2022-02-20 MED ORDER — CLOPIDOGREL BISULFATE 75 MG PO TABS
75.0000 mg | ORAL_TABLET | Freq: Every day | ORAL | 1 refills | Status: DC
  Filled 2022-02-20: qty 30, 30d supply, fill #0

## 2022-02-20 MED ORDER — CLOPIDOGREL BISULFATE 75 MG PO TABS: 75.0000 mg | ORAL_TABLET | Freq: Every day | ORAL | Status: DC

## 2022-02-20 MED ORDER — ATORVASTATIN CALCIUM 80 MG PO TABS: 80.0000 mg | ORAL_TABLET | Freq: Every day | ORAL | Status: DC

## 2022-02-20 NOTE — Discharge Summary (Signed)
Family Medicine Teaching [facility] Discharge Summary  Patient name: Nicholas Shelton Medical record number: [id number] Date of birth: 01-03-1941 Age: 81 y.o. 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 rate (!) 23, weight 84.8 kg, SpO2 99 %. Physical Exam: General: Alert and oriented in no apparent distress, elderly, pleasant  Heart: Regular rate and rhythm with no murmurs appreciated Lungs: CTA bilaterally, no wheezing Abdomen: no abdominal pain Skin: Warm and dry Extremities: No lower extremity edema Neuro: mild ptosis of left eye, EOMI, tracking appropriately, no weakness evident, no facial drooping with appropriate response to verbal stimuli   Exam by Dr. Alfredo Martinez   Brief Hospital Course:  Nicholas Shelton is a 81 y.o.male with a history of TAVR with bioprosthetic valve, HTN, HFrEF, HLD, DM2, and GERD who was admitted to the [facility] Medicine Teaching Service at [facility] for 3 weeks of diploplia, found to have branch retinal artery occlusion and sent to the ED by ophthalmology for stroke work-up. His hospital course is detailed below:  Branch retinal Artery Occlusion Stroke Work-up Saccular left PCOM aneurysm Patient was found to have a branch retinal artery occlusion while at his ophthalmologist and was then found to be fairly hypertensive in the emergency department to the 200s/70s.  On admission neuro exam unremarkable. 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  Neurology continue with a stroke work-up, obtaining restratification labs, MRA of the head, carotid ultrasound.  MRA found saccular left PCOM aneurysm, so IR was also consulted during this admission evaluate for possible endovascular treatment and decided to continue with a cerebral angiogram outpatient.  Carotid ultrasound showed 1-39% stenosis of b/l carotid's. Neurology recommended to continue with DAPT therapy, ASA + Plavix for 3 weeks and then continue with the Plavix alone. LDL 165, patient was only on Lipitor 20 mg twice weekly prior to admission. He elected to go home on home dosage as he would like to discuss with his cardiologist prior to adjustment. Education was provided and patient was d/c back on home statin per his preference.  He was evaluated by PT and OT who did not have any further recommendations.  Hypertension HFrEF (LVEF 35-40%, G2DD, mild dilatation of left atrium/moderate dilatation of right atrium)  S/p TAVR Home regimen of antihypertensives were started, and the patient appropriately decreased, became normotensive.  Continued home regimen: coreg 3.125 mg twice daily, clonidine 0.1 mg twice daily, hydralazine 100 mg 3 times daily, HCTZ 25 mg daily, isosorbide dinitrate 30 mg twice daily, Entresto 97-103 mg twice daily, Lasix 20 mg daily.  He remained euvolemic during his stay. Blood pressures 140s systolic on d/c.  Normocytic anemia, chronic Hemoglobin down to 11.7 during this admission.  Iron panel unremarkable from 1 year ago, can work-up further outpatient if indicated.  Hyperlipidemia Lipid panel showed an LDL of 165 that is greater than goal of <70.  Patient declined increasing dosage of his atorvastatin twice weekly 20 mg inpatient and  wanted to speak to his cardiologist about this.  Other chronic conditions were medically managed with home medications and formulary  alternatives as necessary.  PCP Follow-up Recommendations: Patient declined increase in Statin. Discussed our recommendations given his elevated LDL but he preferred to discuss with his cardiologist outpatient. Consider continued discussion and increase to high-intensity statin with goal of LDL<70 Repeat CBC on discharge  Ensure appropriate f/u with IR for cerebral angiogram  DAPT Therapy: ASA + Plavix x 3wks then Plavix alone   Significant Procedures: None  Significant Labs and Imaging:  Recent Labs  Lab 02/19/22 1003 02/20/22 0410  WBC 4.7 5.6  HGB 12.6* 11.7*  HCT 38.1* 35.6*  PLT 176 161   Recent Labs  Lab 02/19/22 1003 02/20/22 0410  NA 138 140  K 4.1 3.8  CL 104 108  CO2 25 26  GLUCOSE 130* 130*  BUN 21 24*  CREATININE 0.99 1.04  CALCIUM 9.6 9.0  ALKPHOS 75  --   AST 27  --   ALT 24  --   ALBUMIN 3.6  --     MR ANGIO HEAD WO CONTRAST  Result Date: 02/19/2022 CLINICAL DATA:  Initial evaluation for neuro deficit, stroke suspected, diplopia. EXAM: MRA HEAD WITHOUT CONTRAST TECHNIQUE: Angiographic images of the Circle of Willis were acquired using MRA technique without intravenous contrast. COMPARISON:  Comparison made with previous brain MRI from earlier the same day. FINDINGS: Anterior circulation: Visualized distal cervical segments of the internal carotid arteries are widely patent with antegrade flow. Petrous segments patent bilaterally. Mild atheromatous irregularity within the carotid siphons without hemodynamically significant stenosis. 4 mm saccular aneurysm extending laterally and posteriorly from the supraclinoid left ICA consistent with a PCOM aneurysm (series 5, image 117). Additional 2 mm outpouching extending superiorly from the supraclinoid left ICA felt to be most consistent with a vascular infundibulum related to the left ophthalmic artery. A1 segments patent bilaterally. Normal anterior communicating artery complex. Both ACAs mildly irregular but patent  without significant stenosis. No M1 stenosis or occlusion. Normal left MCA bifurcation. Right MCA trifurcates. No proximal MCA branch occlusion. Distal MCA branches well perfused and symmetric, although demonstrate small vessel atheromatous irregularity. Posterior circulation: Both V4 segments widely patent to the vertebrobasilar junction without stenosis. Both PICA patent at their origins. Basilar mildly irregular but patent to its distal aspect without stenosis. Superior cerebellar arteries patent bilaterally. Both PCAs primarily supplied via the basilar and are well perfused to their distal aspects without flow-limiting stenosis. Distal small vessel atheromatous irregularity. Anatomic variants: None significant. Other: None. IMPRESSION: 1. Negative intracranial MRA for large vessel occlusion or other emergent finding. 2. Mild intracranial atherosclerotic disease without hemodynamically significant or correctable stenosis. 3. 4 mm saccular left PCOM aneurysm. This would likely be amendable to endovascular treatment. Consultation with neuro interventional radiology suggested. Electronically Signed   By: Rise Mu M.D.   On: 02/19/2022 21:08   VAS US CAROTID  Result Date: 02/20/2022 Carotid Arterial Duplex Study Patient Name:  Nicholas Shelton  Date of Exam:   02/20/2022 Medical Rec #: [id number]   Accession #:    [id number] Date of Birth: 11/20/1940   Patient Gender: M Patient Age:   50 years Exam Location:  [facility] Procedure:      VAS US CAROTID Referring Phys: Claris Che PRAY --------------------------------------------------------------------------------  Indications:       CVA. Risk Factors:      Hypertension, hyperlipidemia, Diabetes, past history of  smoking. Comparison Study:  12-19-2020 Prior carotid duplex showed 1-15% ICA stenosis                    bilaterally. Performing Technologist: Jean Rosenthal RDMS, RVT  Examination Guidelines: A complete evaluation includes B-mode  imaging, spectral Doppler, color Doppler, and power Doppler as needed of all accessible portions of each vessel. Bilateral testing is considered an integral part of a complete examination. Limited examinations for reoccurring indications may be performed as noted.  Right Carotid Findings: +----------+--------+--------+--------+-------------------------+--------+           PSV cm/sEDV cm/sStenosisPlaque Description       Comments +----------+--------+--------+--------+-------------------------+--------+ CCA Prox  67      13                                                +----------+--------+--------+--------+-------------------------+--------+ CCA Distal47      9                                                 +----------+--------+--------+--------+-------------------------+--------+ ICA Prox  81      12      1-39%   calcific and heterogenous         +----------+--------+--------+--------+-------------------------+--------+ ICA Distal67      11                                                +----------+--------+--------+--------+-------------------------+--------+ ECA       94                      calcific                          +----------+--------+--------+--------+-------------------------+--------+ +----------+--------+-------+----------------+-------------------+           PSV cm/sEDV cmsDescribe        Arm Pressure (mmHG) +----------+--------+-------+----------------+-------------------+ Subclavian100            Multiphasic, WNL                    +----------+--------+-------+----------------+-------------------+ +---------+--------+--+--------+-+---------+ VertebralPSV cm/s60EDV cm/s7Antegrade +---------+--------+--+--------+-+---------+  Left Carotid Findings: +----------+--------+--------+--------+-------------------------+--------+           PSV cm/sEDV cm/sStenosisPlaque Description       Comments  +----------+--------+--------+--------+-------------------------+--------+ CCA Prox  61      6                                                 +----------+--------+--------+--------+-------------------------+--------+ CCA Distal56      7                                                 +----------+--------+--------+--------+-------------------------+--------+ ICA Prox  43      9       1-39%   calcific and heterogenous         +----------+--------+--------+--------+-------------------------+--------+  ICA Distal83      20                                                +----------+--------+--------+--------+-------------------------+--------+ ECA       93                      calcific                          +----------+--------+--------+--------+-------------------------+--------+ +----------+--------+--------+----------------+-------------------+           PSV cm/sEDV cm/sDescribe        Arm Pressure (mmHG) +----------+--------+--------+----------------+-------------------+ [id number]             Multiphasic, WNL                    +----------+--------+--------+----------------+-------------------+ +---------+--------+--+--------+--+---------+ VertebralPSV cm/s54EDV cm/s13Antegrade +---------+--------+--+--------+--+---------+   Summary: Right Carotid: Velocities in the right ICA are consistent with a 1-39% stenosis. Left Carotid: Velocities in the left ICA are consistent with a 1-39% stenosis. Vertebrals:  Bilateral vertebral arteries demonstrate antegrade flow. Subclavians: Normal flow hemodynamics were seen in bilateral subclavian              arteries. *See table(s) above for measurements and observations.     Preliminary      Results/Tests Pending at Time of Discharge: None   Discharge Medications:  Allergies as of 02/20/2022       Reactions   Trazodone And Nefazodone Other (See Comments)   Sluggishness the next morning        Medication List      TAKE these medications    acetaminophen 500 MG tablet Commonly known as: TYLENOL Take 500 mg by mouth every 8 (eight) hours as needed for moderate pain.   aspirin EC 81 MG tablet Take 1 tablet (81 mg total) by mouth at bedtime for 21 days. Swallow whole.   atorvastatin 20 MG tablet Commonly known as: LIPITOR Take 1 tablet (20 mg total) by mouth 2 (two) times a week. What changed:  when to take this additional instructions   carvedilol 3.125 MG tablet Commonly known as: COREG Take 1 tablet (3.125 mg total) by mouth 2 (two) times daily with a meal.   cloNIDine 0.2 MG tablet Commonly known as: Catapres Take 1 tablet (0.2 mg total) by mouth 2 (two) times daily. What changed:  how much to take when to take this   clopidogrel 75 MG tablet Commonly known as: PLAVIX Take 1 tablet (75 mg total) by mouth daily.   Entresto 97-103 MG Generic drug: sacubitril-valsartan Take 1 tablet by mouth 2 (two) times daily. What changed: when to take this   furosemide 20 MG tablet Commonly known as: LASIX Take 1 tablet (20 mg total) by mouth daily.   hydrALAZINE 100 MG tablet Commonly known as: APRESOLINE Take 1 tablet (100 mg total) by mouth 3 (three) times daily.   hydrochlorothiazide 25 MG tablet Commonly known as: HYDRODIURIL Take 25 mg by mouth daily.   isosorbide dinitrate 30 MG tablet Commonly known as: ISORDIL Take 1 tablet (30 mg total) by mouth 3 (three) times daily. What changed: when to take this   metFORMIN 1000 MG tablet Commonly known as: GLUCOPHAGE Take 1 tablet (1,000 mg total) by mouth 2 (two) times daily with a meal.  pantoprazole 40 MG tablet Commonly known as: PROTONIX Take 40 mg by mouth daily before breakfast.        Discharge Instructions: Please refer to Patient Instructions section of EMR for full details.  Patient was counseled important signs and symptoms that should prompt return to medical care, changes in medications, dietary  instructions, activity restrictions, and follow up appointments.   Follow-Up Appointments:  Follow-up Information     Barbie Banner, MD. Schedule an appointment as soon as possible for a visit in 1 week(s).   Specialty: Family Medicine Contact information: [address] [place] [address] [phone]                 Sabino Dick, DO 02/20/2022, 2:01 PM PGY-2, [facility] Family [facility]

## 2022-02-20 NOTE — TOC Initial Note (Signed)
Transition of Care (TOC) - Initial/Assessment Note  ? ? ?Patient Details  ?Name: Nicholas Shelton ?MRN: [id number] ?Date of Birth: April 20, 1941 ? ?Transition of Care (TOC) CM/SW Contact:    ?Verdell Carmine, RN ?Phone Number: ?02/20/2022, 4:02 PM ? ?Clinical Narrative:                 ? ?Transition of Care Department [facility]) has reviewed patient and no TOC needs have been identified at this time. We will continue to monitor patient advancement through interdisciplinary progression rounds. If new patient transition needs arise, please place a TOC consult ?  ?  ? ? ?Patient Goals and CMS Choice ?  ?  ?  ? ?Expected Discharge Plan and Services ?  ?  ?  ?  ?  ?Expected Discharge Date: 02/20/22               ?  ?  ?  ?  ?  ?  ?  ?  ?  ?  ? ?Prior Living Arrangements/Services ?  ?  ?  ?       ?  ?  ?  ?  ? ?Activities of Daily Living ?  ?  ? ?Permission Sought/Granted ?  ?  ?   ?   ?   ?   ? ?Emotional Assessment ?  ?  ?  ?  ?  ?  ? ?Admission diagnosis:  Retinal arterial branch occlusion [H34.239] ?Patient Active Problem List  ? Diagnosis Date Noted  ? Retinal arterial branch occlusion 02/19/2022  ? Hypokalemia 12/25/2021  ? HTN (hypertension) 12/24/2021  ? HLD (hyperlipidemia) 12/24/2021  ? Type 2 diabetes mellitus with complication, without long-term current use of insulin ([place]) 12/24/2021  ? Hx of tobacco use, presenting hazards to health 12/24/2021  ? Nonischemic cardiomyopathy ([place]) 12/24/2021  ? Severe aortic stenosis 12/24/2021  ? 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 Acute on chronic combined systolic and diastolic CHF (congestive heart failure) ([place]) 12/24/2021  ? Osteoarthritis of right knee 02/06/2021  ? Abnormal nuclear stress test 01/14/2021  ? Preoperative cardiovascular examination: Left knee arthroplasty 01/23/2021 by Dr. Theda Sers 01/14/2021  ? Incarcerated inguinal hernia 10/30/2018  ? ?PCP:  Christain Sacramento, MD ?Pharmacy:   ?[id number] - [place], [place] - [address] ?[address] ?[place] [address] ?Phone: [phone] Fax: [phone] ? ?[facility] ?[address] ?[place] [address] ?Phone: [phone] Fax: [phone] ? ? ? ? ?Social Determinants of Health (SDOH) Interventions ?  ? ?Readmission Risk Interventions ? ?  12/25/2021  ? 11:16 AM  ?Readmission Risk Prevention Plan  ?Post Dischage Appt Complete  ?Medication Screening Complete  ?Transportation Screening Complete  ? ? ? ?

## 2022-02-20 NOTE — Discharge Instructions (Addendum)
Dear [name],  ? ?Thank you so much for allowing [place] to be part of your care!  You were admitted to [facility] for blurry vision and high blood pressure.  You were found to have an occlusion in one of the arteries that contribute to your blood supply to your eye. ? ?Neurology recommended to continue with ASA and Plavix for 3 weeks then just Plavix alone  ? ?IR will contact you about following up for a cerebral arteriogram to view your occlusion.  ? ? ?POST-HOSPITAL & CARE INSTRUCTIONS ?You would likely benefit from an increase in your atorvastatin, please talk to your cardiologist about this ?Please let PCP/Specialists know of any changes that were made.  ?Please see medications section of this packet for any medication changes.  ? ?DOCTOR'S APPOINTMENT & FOLLOW UP CARE INSTRUCTIONS  ?Future Appointments  ?Date Time Provider [place]  ?02/20/2022 11:00 AM MC VASC US 4 [facility]-VASCC [facility]  ?03/03/2022 10:15 AM Tolia, Sunit, DO PCV-PCV None  ?12/03/2022  2:05 PM MC-CV CH ECHO 1 [facility]-SITE3ECHO [facility]  ?12/03/2022  3:00 PM CVD-CHURCH STRUCTURAL HEART APP CVD-CHUSTOFF [facility]  ? ? ?RETURN PRECAUTIONS: ? ?Please return if you experience change in vision, inability to see, headache, dizziness, confusion, chest pain, shortness of breath, weakness, numbness ? ?Take care and be well! ? ?Family Medicine Teaching Service  ?[place]  ?[facility]  ?[address] [place], [place] [address] ?[phone] ? ?

## 2022-02-20 NOTE — Progress Notes (Signed)
Request received for NIR management of saccular left PCOM aneurysm.  ? ?Case reviewed by Dr. Karenann Cai, patient appears to be appropriate for outpatient consult unless there is concern for [facility].  ? ?Confirmed with ordering MD Dr. Zigmund Daniel, no concern for [facility]. ?NIR schedulers notified that this patient needs to be scheduled for outpatient consultation visit with Dr. Karenann Cai once he is discharged.  ? ?Please call NIR for questions and concerns.  ? ?Tera Mater PA-C ?02/20/2022 11:45 AM ? ? ? ? ?

## 2022-02-20 NOTE — Evaluation (Signed)
Occupational Therapy Evaluation ?Patient Details ?Name: Nicholas Shelton ?MRN: [id number] ?DOB: 1941-02-05 ?Today's Date: 02/20/2022 ? ? ?History of Present Illness Pt presented to ED from opthamologist with lt branch retinal artery occlusion and to have stroke work up. MRI negative for acute ischemic stroke. MRA showed lt PCOM aneurysm. PMH - DM, HTN, TAVR, rt TKR  ? ?Clinical Impression ?  ?Pt independent at baseline with ADL, mobility and IADL tasks. Pt not currently experiencing diplopia, however when he does, he states the double vision is present at all times, objects are side by side, and double image disappears when one eye is closed. Pt appears to be L eye dominant. Educated pt on use of partial occlusion by occluding nasal portion of R eye to improve functional vision. Pt verbalized understanding. Pt given written instructions as well as a pair of glasses with R nasal field occluded. Pt plans to follow up with his eye doctor. Pt very appreciative.   ?   ? ?Recommendations for follow up therapy are one component of a multi-disciplinary discharge planning process, led by the attending physician.  Recommendations may be updated based on patient status, additional functional criteria and insurance authorization.  ? ?Follow Up Recommendations ?    ?  ?Assistance Recommended at Discharge PRN  ?Patient can return home with the following   ? ?  ?Functional Status Assessment ? Patient has not had a recent decline in their functional status  ?Equipment Recommendations ? None recommended by OT  ?  ?Recommendations for Other Services   ? ? ?  ?Precautions / Restrictions Precautions ?Precautions: None  ? ?  ? ?Mobility Bed Mobility ?Overal bed mobility: Independent ?  ?  ?  ?  ?  ?  ?  ?  ? ?Transfers ?Overall transfer level: Modified independent ?Equipment used: None ?  ?  ?  ?  ?  ?  ?  ?  ?  ? ?  ?Balance Overall balance assessment: Mild deficits observed, not formally tested ?  ?  ?  ?  ?  ?  ?  ?  ?  ?  ?  ?  ?  ?  ?  ?  ?   ?  ?   ? ?ADL either performed or assessed with clinical judgement  ? ?ADL Overall ADL's : At baseline ?  ?  ?  ?  ?  ?  ?  ?  ?  ?  ?  ?  ?  ?  ?  ?  ?  ?  ?  ?   ? ? ? ?Vision   ?Vision Assessment?: Yes ?Diplopia Assessment: Disappears with one eye closed;Objects split side to side;Present all the time/all directions (intermittently)  ?   ?Perception   ?  ?Praxis   ?  ? ?Pertinent Vitals/Pain Pain Assessment ?Pain Assessment: No/denies pain  ? ? ? ?Hand Dominance Right ?  ?Extremity/Trunk Assessment Upper Extremity Assessment ?Upper Extremity Assessment: Overall WFL for tasks assessed ?  ?Lower Extremity Assessment ?Lower Extremity Assessment: Defer to PT evaluation ?  ?  ?  ?Communication Communication ?Communication: HOH ?  ?Cognition Arousal/Alertness: Awake/alert ?Behavior During Therapy: [facility] for tasks assessed/performed ?Overall Cognitive Status: Within Functional Limits for tasks assessed ?  ?  ?  ?  ?  ?  ?  ?  ?  ?  ?  ?  ?  ?  ?  ?  ?  ?  ?  ?General Comments    ? ?  ?Exercises   ?  ?  Shoulder Instructions    ? ? ?Home Living Family/patient expects to be discharged to:: Private residence ?Living Arrangements: Spouse/significant other ?Available Help at Discharge: Family (wife is disabled) ?Type of Home: House ?Home Access: Ramped entrance ?  ?  ?Home Layout: One level ?  ?  ?Bathroom Shower/Tub: Walk-in shower ?  ?Bathroom Toilet: Handicapped height ?Bathroom Accessibility: Yes ?  ?Home Equipment: Cane - single Barista (2 wheels) ?  ?  ?  ? ?  ?Prior Functioning/Environment Prior Level of Function : Independent/Modified Independent;Driving ?  ?  ?  ?  ?  ?  ?Mobility Comments: Not using assistive device ?  ?  ? ?  ?  ?OT Problem List: Impaired vision/perception ?  ?   ?OT Treatment/Interventions:    ?  ?OT Goals(Current goals can be found in the care plan section) Acute Rehab OT Goals ?Patient Stated Goal: to go home ?OT Goal Formulation: All assessment and education complete, DC therapy  ?OT  Frequency:   ?  ? ?Co-evaluation   ?  ?  ?  ?  ? ?  ?AM-PAC OT "6 Clicks" Daily Activity     ?Outcome Measure Help from another person eating meals?: None ?Help from another person taking care of personal grooming?: None ?Help from another person toileting, which includes using toliet, bedpan, or urinal?: None ?Help from another person bathing (including washing, rinsing, drying)?: None ?Help from another person to put on and taking off regular upper body clothing?: None ?Help from another person to put on and taking off regular lower body clothing?: None ?6 Click Score: 24 ?  ?End of Session Nurse Communication: Other (comment) (DC needs) ? ?Activity Tolerance: Patient tolerated treatment well ?Patient left: in bed;with call bell/phone within reach ? ?OT Visit Diagnosis: Low vision, both eyes (H54.2)  ?              ?Time: 5956-3875 ?OT Time Calculation (min): 19 min ?Charges:  OT General Charges ?$OT Visit: 1 Visit ?OT Evaluation ?$OT Eval Low Complexity: 1 Low ? ?[facility], OT/L  ? ?Acute OT Clinical Specialist ?Acute Rehabilitation Services ?Pager [phone] ?Office [phone]  ? ?Gavrielle Streck,HILLARY ?02/20/2022, 1:20 PM ?

## 2022-02-20 NOTE — ED Notes (Signed)
Pt c/o being hungry  given a sandwich  no pain ?

## 2022-02-20 NOTE — Evaluation (Signed)
Physical Therapy Evaluation ?Patient Details ?Name: Nicholas Shelton ?MRN: [id number] ?DOB: November 30, 1940 ?Today's Date: 02/20/2022 ? ?History of Present Illness ? Pt presented to ED from opthamologist with lt branch retinal artery occlusion and to have stroke work up. MRI negative for acute ischemic stroke. MRA showed lt PCOM aneurysm. PMH - DM, HTN, TAVR, rt TKR  ?Clinical Impression ? Pt doing well with mobility and no further PT needed.  Ready for dc from PT standpoint. ?   ?   ? ?Recommendations for follow up therapy are one component of a multi-disciplinary discharge planning process, led by the attending physician.  Recommendations may be updated based on patient status, additional functional criteria and insurance authorization. ? ?Follow Up Recommendations No PT follow up ? ?  ?Assistance Recommended at Discharge None  ?Patient can return home with the following ?   ? ?  ?Equipment Recommendations None recommended by PT  ?Recommendations for Other Services ?    ?  ?Functional Status Assessment Patient has not had a recent decline in their functional status  ? ?  ?Precautions / Restrictions Precautions ?Precautions: None  ? ?  ? ?Mobility ? Bed Mobility ?Overal bed mobility: Independent ?  ?  ?  ?  ?  ?  ?  ?  ? ?Transfers ?Overall transfer level: Modified independent ?Equipment used: None ?  ?  ?  ?  ?  ?  ?  ?  ?  ? ?Ambulation/Gait ?Ambulation/Gait assistance: Modified independent (Device/Increase time) ?Gait Distance (Feet): 200 Feet ?Assistive device: None ?Gait Pattern/deviations: Decreased stride length ?Gait velocity: appropriated for age ?Gait velocity interpretation: >2.62 ft/sec, indicative of community ambulatory ?  ?General Gait Details: Steady gait ? ?Stairs ?  ?  ?  ?  ?  ? ?Wheelchair Mobility ?  ? ?Modified Rankin (Stroke Patients Only) ?  ? ?  ? ?Balance Overall balance assessment: Mild deficits observed, not formally tested ?  ?  ?  ?  ?  ?  ?  ?  ?  ?  ?  ?  ?  ?  ?  ?  ?  ?  ?   ? ? ? ?Pertinent  Vitals/Pain Pain Assessment ?Pain Assessment: No/denies pain  ? ? ?Home Living Family/patient expects to be discharged to:: Private residence ?Living Arrangements: Spouse/significant other ?Available Help at Discharge: Family (wife is disabled) ?Type of Home: House ?Home Access: Ramped entrance ?  ?  ?  ?Home Layout: One level ?Home Equipment: Cane - single Barista (2 wheels) ?   ?  ?Prior Function Prior Level of Function : Independent/Modified Independent;Driving ?  ?  ?  ?  ?  ?  ?Mobility Comments: Not using assistive device ?  ?  ? ? ?Hand Dominance  ? Dominant Hand: Right ? ?  ?Extremity/Trunk Assessment  ? Upper Extremity Assessment ?Upper Extremity Assessment: Defer to OT evaluation ?  ? ?Lower Extremity Assessment ?Lower Extremity Assessment: Overall WFL for tasks assessed ?  ? ?   ?Communication  ? Communication: HOH  ?Cognition Arousal/Alertness: Awake/alert ?Behavior During Therapy: [facility] for tasks assessed/performed ?Overall Cognitive Status: Within Functional Limits for tasks assessed ?  ?  ?  ?  ?  ?  ?  ?  ?  ?  ?  ?  ?  ?  ?  ?  ?  ?  ?  ? ?  ?General Comments   ? ?  ?Exercises    ? ?Assessment/Plan  ?  ?PT Assessment Patient does not  need any further PT services  ?PT Problem List   ? ?   ?  ?PT Treatment Interventions     ? ?PT Goals (Current goals can be found in the Care Plan section)  ?Acute Rehab PT Goals ?PT Goal Formulation: With patient ? ?  ?Frequency   ?  ? ? ?Co-evaluation   ?  ?  ?  ?  ? ? ?  ?AM-PAC PT "6 Clicks" Mobility  ?Outcome Measure Help needed turning from your back to your side while in a flat bed without using bedrails?: None ?Help needed moving from lying on your back to sitting on the side of a flat bed without using bedrails?: None ?Help needed moving to and from a bed to a chair (including a wheelchair)?: None ?Help needed standing up from a chair using your arms (e.g., wheelchair or bedside chair)?: None ?Help needed to walk in hospital room?: None ?Help needed  climbing 3-5 steps with a railing? : None ?6 Click Score: 24 ? ?  ?End of Session   ?Activity Tolerance: Patient tolerated treatment well ?Patient left: in bed;Other (comment) (sitting on stretcher with OT present) ?  ?PT Visit Diagnosis: Other (comment) ?  ? ?Time: 7741-2878 ?PT Time Calculation (min) (ACUTE ONLY): 10 min ? ? ?Charges:   PT Evaluation ?$PT Eval Low Complexity: 1 Low ?  ?  ?   ? ? ?[facility] PT ?Acute Rehabilitation Services ?Office [phone] ? ? ?Shary Decamp [facility] ?02/20/2022, 11:54 AM ?

## 2022-02-20 NOTE — Progress Notes (Addendum)
Family Medicine Teaching Service ?Daily Progress Note ?Intern Pager: [phone] ? ?Patient name: Nicholas Shelton Medical record number: [id number] ?Date of birth: 04/04/1941 Age: 81 y.o. Gender: male ? ?Primary Care Provider: Christain Sacramento, MD ?Consultants: Neurology  ?Code Status: FULL  ? ?Pt Overview and Major Events to Date:  ?02/19/22: Admitted to [facility]  ? ?Assessment and Plan: ? ?Nicholas Shelton is a 81 y.o. 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 PMH is significant for TAVR with bioprosthetic valve, HTN, HFrEF, HLD, DM2, and GERD.  ? ?Branch retinal Artery Occlusion ?Stroke Work-up ?Saccular left PCOM aneurysm ?Neurology following, MRA head result: 4 mm saccular left PCOM aneurysm, no large vessel occlusion, mild intracranial atherosclerotic disease. Will likely consult IR for assessment of possible endovascular treatment. U/S carotid pending, risk stratification labs obtained (LDL 165, A1C 6.2). Neuro exam today unchanged from previous, mild left sided ptosis.  Will discuss medications options with neurology, consider DAPT as he had an occlusion while on ASA.  ?- Frequent neuro checks  ?- Continue with ASA, consider DAPT  ?- Follow up carotid U/S  ?- Cardiac monitoring  ?- PT/OT evaluate and treat  ?- Consult IR for eval ?- Consider increase in statin (see below)  ?- Neurology following, appreciate recommendations  ? ?Hypertension ?HFrEF (LVEF 35-40%, G2DD, mild dilatation of left atrium/moderate dilatation of right atrium)  S/p TAVR ?BP this morning 120s/50s and normotensive, home regimen: coreg 3.125 mg twice daily, clonidine 0.1 mg twice daily, hydralazine 100 mg 3 times daily, HCTZ 25 mg daily, isosorbide dinitrate 30 mg twice daily, Entresto 97-103 mg twice daily, Lasix 20 mg daily. No evidence of hypervolemia on exam today, stable on RA.  ?- Continue home regimen  ?- strict I/Os  ? ?Normocytic Anemia, chronic    ?Hgb 12.6>11.7 (baseline fluctuates from 11-12), iron panel unremarkable from 1 year ago. Will monitor, possibly anemia of chronic disease. Can also work up further outpatient  ? ?T2DM  ?Continue with Metformin '1000mg'$  BID, glucose on BMP this AM 130 and stable.  ?- Continue Metformin  ?- Monitor for hyperglycemia, can put on SSI if needed  ?- CBG AC & QHS>will discontinue if sugars remain in goal  ? ?HLD  ?Lipid panel: Total 255, LDL 165, Triglycerides 139, HDL 62. Patient is on atorvastatin twice weekly 20 mg due to side effects (muscle cramping). Patient declines increase in dosage inpatient, he would like to ask cardiologist about this.  ?- consider increasing dose outpatient  ?- goal of LDL<70   ? ?FEN/GI: Regular  ?PPx: Lovenox  ?Dispo:Home today or tomorrow. Barriers include evaluation from IR and further imaging studies pending.  ? ?Subjective:  ?Patient is doing well this AM, he notes only blurry vision in left eye that dissipates when covering the right eye. No other complaints this AM.  ? ?Objective: ?Temp:  [97.8 ?F (36.6 ?C)-99.2 ?F (37.3 ?C)] 97.9 ?F (36.6 ?C) (04/13 0201) ?Pulse Rate:  [59-100] 63 (04/13 0800) ?Resp:  [11-24] 12 (04/13 0800) ?BP: (123-207)/(48-87) 180/61 (04/13 0800) ?SpO2:  [96 %-100 %] 99 % (04/13 0800) ?Weight:  [84.8 kg] 84.8 kg (04/13 0700) ?Physical Exam: ?General: Alert and oriented in no apparent distress, elderly, pleasant  ?Heart: Regular rate and rhythm with no murmurs appreciated ?Lungs: CTA bilaterally, no wheezing ?Abdomen: no abdominal pain ?Skin: Warm and dry ?Extremities: No lower extremity edema ?Neuro: mild ptosis of left eye, EOMI, tracking appropriately, no  weakness evident, no facial drooping with appropriate response to verbal stimuli  ? ? ?Laboratory: ?Recent Labs  ?Lab 02/19/22 ?1003 02/20/22 ?0410  ?WBC 4.7 5.6  ?HGB 12.6* 11.7*  ?HCT 38.1* 35.6*  ?PLT 176 161  ? ?Recent Labs  ?Lab 02/19/22 ?1003 02/20/22 ?0410  ?NA 138 140  ?K 4.1 3.8  ?CL 104 108  ?CO2 25  26  ?BUN 21 24*  ?CREATININE 0.99 1.04  ?CALCIUM 9.6 9.0  ?PROT 6.8  --   ?BILITOT 0.4  --   ?ALKPHOS 75  --   ?ALT 24  --   ?AST 27  --   ?GLUCOSE 130* 130*  ? ? ? ?Imaging/Diagnostic Tests: ?MR ANGIO HEAD WO CONTRAST ?Result Date: 02/19/2022 ?IMPRESSION: 1. Negative intracranial MRA for large vessel occlusion or other emergent finding. 2. Mild intracranial atherosclerotic disease without hemodynamically significant or correctable stenosis. 3. 4 mm saccular left PCOM aneurysm. This would likely be amendable to endovascular treatment. Consultation with neuro interventional radiology suggested.  ? ? ? ?Erskine Emery, MD ?02/20/2022, 10:58 AM ?PGY-1, [facility] ?[place] Intern pager: [phone], text pages welcome ? ?

## 2022-02-20 NOTE — Progress Notes (Signed)
Carotid duplex bilateral study completed.   Please see CV Proc for preliminary results.   Jeymi Hepp, RDMS, RVT  

## 2022-02-20 NOTE — Progress Notes (Addendum)
STROKE TEAM PROGRESS NOTE  ? ?INTERVAL HISTORY ?Patient is seen in his room with no family at the bedside.  He reports that three weeks ago, he began experiencing vertical diplopia.  He saw his ophthalmologist for this and was found to have left eye BRAO.  He denies vision loss in either eye and is now presenting for stroke workup.His diplopia has resolved. ? ?Vitals:  ? 02/20/22 0500 02/20/22 0700 02/20/22 0800 02/20/22 1215  ?BP: (!) 123/55  (!) 180/61 (!) 146/60  ?Pulse: 64  63 64  ?Resp: 15  12 (!) 23  ?Temp:      ?TempSrc:      ?SpO2: 98%  99% 99%  ?Weight:  84.8 kg    ? ?CBC:  ?Recent Labs  ?Lab 02/19/22 ?1003 02/20/22 ?0410  ?WBC 4.7 5.6  ?NEUTROABS 3.2  --   ?HGB 12.6* 11.7*  ?HCT 38.1* 35.6*  ?MCV 87.6 87.9  ?PLT 176 161  ? ?Basic Metabolic Panel:  ?Recent Labs  ?Lab 02/19/22 ?1003 02/20/22 ?0410  ?NA 138 140  ?K 4.1 3.8  ?CL 104 108  ?CO2 25 26  ?GLUCOSE 130* 130*  ?BUN 21 24*  ?CREATININE 0.99 1.04  ?CALCIUM 9.6 9.0  ? ?Lipid Panel:  ?Recent Labs  ?Lab 02/19/22 ?1836  ?CHOL 255*  ?TRIG 139  ?HDL 62  ?CHOLHDL 4.1  ?VLDL 28  ?LDLCALC 165*  ? ?HgbA1c:  ?Recent Labs  ?Lab 02/19/22 ?1836  ?HGBA1C 6.2*  ? ?Urine Drug Screen:  ?Recent Labs  ?Lab 02/19/22 ?1011  ?LABOPIA NONE DETECTED  ?COCAINSCRNUR NONE DETECTED  ?LABBENZ NONE DETECTED  ?AMPHETMU NONE DETECTED  ?THCU NONE DETECTED  ?LABBARB NONE DETECTED  ?  ?Alcohol Level No results for input(s): ETH in the last 168 hours. ? ?IMAGING past 24 hours ?MR ANGIO HEAD WO CONTRAST ? ?Result Date: 02/19/2022 ?CLINICAL DATA:  Initial evaluation for neuro deficit, stroke suspected, diplopia. EXAM: MRA HEAD WITHOUT CONTRAST TECHNIQUE: Angiographic images of the Circle of Willis were acquired using MRA technique without intravenous contrast. COMPARISON:  Comparison made with previous brain MRI from earlier the same day. FINDINGS: Anterior circulation: Visualized distal cervical segments of the internal carotid arteries are widely patent with antegrade flow. Petrous segments  patent bilaterally. Mild atheromatous irregularity within the carotid siphons without hemodynamically significant stenosis. 4 mm saccular aneurysm extending laterally and posteriorly from the supraclinoid left ICA consistent with a PCOM aneurysm (series 5, image 117). Additional 2 mm outpouching extending superiorly from the supraclinoid left ICA felt to be most consistent with a vascular infundibulum related to the left ophthalmic artery. A1 segments patent bilaterally. Normal anterior communicating artery complex. Both ACAs mildly irregular but patent without significant stenosis. No M1 stenosis or occlusion. Normal left MCA bifurcation. Right MCA trifurcates. No proximal MCA branch occlusion. Distal MCA branches well perfused and symmetric, although demonstrate small vessel atheromatous irregularity. Posterior circulation: Both V4 segments widely patent to the vertebrobasilar junction without stenosis. Both PICA patent at their origins. Basilar mildly irregular but patent to its distal aspect without stenosis. Superior cerebellar arteries patent bilaterally. Both PCAs primarily supplied via the basilar and are well perfused to their distal aspects without flow-limiting stenosis. Distal small vessel atheromatous irregularity. Anatomic variants: None significant. Other: None. IMPRESSION: 1. Negative intracranial MRA for large vessel occlusion or other emergent finding. 2. Mild intracranial atherosclerotic disease without hemodynamically significant or correctable stenosis. 3. 4 mm saccular left PCOM aneurysm. This would likely be amendable to endovascular treatment. Consultation with neuro interventional radiology suggested. Electronically  Signed   By: Jeannine Boga M.D.   On: 02/19/2022 21:08   ? ?PHYSICAL EXAM ?General:  Alert, well-developed, well-nourished  elderly male patient in no acute distress ?Respiratory:  Respirations regular and unlabored on room air ? ?NEURO:  ?Mental Status: AA&Ox3   ?Speech/Language: speech is without dysarthria or aphasia.  Naming, repetition, fluency, and comprehension intact. ? ?Cranial Nerves:  ?II: PERRL. Visual fields full. Slight restriction of vertical gaze bilaterally.n, no diplopia with either eye covered ?III, IV, VI: EOMI. Eyelids elevate symmetrically. Mild mechanical ptosis of the left eye ?VII: Smile is symmetrical.  ?VIII: hearing intact to voice. ?IX, X:  Phonation is normal.  ?XII: tongue is midline without fasciculations. ?Motor: 5/5 strength to all muscle groups tested.  ?Sensation- Intact to light touch bilaterally.    ?Coordination: FTN intact bilaterally.No drift.  ?Gait- deferred ? ? ?ASSESSMENT/PLAN ?Nicholas Shelton is a 81 y.o. male with history of HTN, HLD, TAVR, gout, anxiety and DM presenting with vertical diplopia which has been present for three weeks.  He was seen by his ophthalmologist for this and was found to have left eye BRAO.  He now presents for further stroke workup. ? ?BRAO:  left retinal artery occlusion  . 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   ?Diet  ? Diet regular Room service appropriate? Yes; Fluid consistency: Thin  ?30 day cardiac monitor after discharge to investigate for paroxysmal atrial fibrillation ?aspirin 81 mg daily prior to admission, now on aspirin 81 mg daily and clopidogrel 75 mg daily. Continue for three weeks, then Plavix alone indefinitely ?Therapy recommendations:  no PT/SLP follow up, OT pending ?Disposition:  pending, likely home ? ?Hypertension ?Home meds:  carvedilol 3.125 mg BID, clonidine 0.2 mg BID, hydralazine 100  mg TID, HCTZ 25 mg daily ?Stable ?Permissive hypertension (OK if < 220/120) but gradually normalize in 5-7 days ?Long-term BP goal normotensive ? ?Hyperlipidemia ?Home meds: atorvastatin 20 mg twice weekly, increased to 80 mg daily ?LDL 165, goal < 70 ?Continue statin at discharge ? ?Diabetes type II Controlled ?Home meds:  none ?HgbA1c 6.2, goal < 7.0 ?CBGs ?Recent Labs  ?  02/19/22 ?2157 02/20/22 ?0746  ?GLUCAP 88 113*  ?  ?SSI ? ?Other Stroke Risk Factors ?Advanced Age >/= 683 ?Former cigarette smoker ?Congestive heart failure ? ?Other Active Problems ?none ? ?Hospital day # 0 ? ?[place], MSN, AGACNP-BC ?[facility] ?See Amion for schedule and pager information ?02/20/2022 1:13 PM ?  ?STROKE MD NOTE : I have personally obtained history,examined this patient, reviewed notes, independently viewed imaging studies, participated in medical decision making and plan of care.ROS completed by me personally and pertinent positives fully documented  I have made any additions or clarifications directly to the above note. Agree with note above. He presented with h/o transient diplopia now resolved and new left eye retinal artery branch occlusion. Continue ongoing stroke w/u. Recommend 30 day heart monitor for PAF given old silent infarcts on MRI.Recommend aspirin and plavix x 3 weeks and then plavix alone and aggresive risk factor modification.I have spent a total of  50  minutes  with the patient reviewing hospital notes,  test results, labs and examining the patient as well as establishing an assessment and plan that was discussed personally with the patient.  > 50% of time was spent in direct patient care. ?  ?  ? ? ?Antony Contras, MD ?Medical Director ?[facility] ?Pager: [phone] ?02/20/2022 4:32 PM ? ? ?To contact Stroke Continuity provider, please refer to [email or web address] ?After hours, contact General Neurology  ?

## 2022-02-20 NOTE — Evaluation (Signed)
Clinical/Bedside Swallow Evaluation ?Patient Details  ?Name: Nicholas Shelton ?MRN: [id number] ?Date of Birth: 04-29-1941 ? ?Today's Date: 02/20/2022 ?Time: SLP Start Time (ACUTE ONLY): 6948 SLP Stop Time (ACUTE ONLY): 5462 ?SLP Time Calculation (min) (ACUTE ONLY): 12 min ? ?Past Medical History:  ?Past Medical History:  ?Diagnosis Date  ? Actinic keratosis   ? Arthritis   ? AV block   ? Bradycardia   ? Degeneration of lumbar or lumbosacral intervertebral disc   ? Deviated nasal septum   ? Diabetes mellitus without complication ([place])   ? Gastroesophageal reflux disease without esophagitis   ? Generalized anxiety disorder   ? Gout with tophus   ? Hearing loss   ? Hyperlipidemia   ? Hypertension   ? Insomnia   ? Murmur   ? Aortic stenosis  ? Nasal turbinate hypertrophy   ? Osteoarthritis of wrist   ? PAC (premature atrial contraction)   ? Peptic ulcer   ? Peripheral neuropathy   ? PVC (premature ventricular contraction)   ? 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 with 34m S3AUR via [place] approach with Dr.Cooper and Dr. BCyndia Bent ? Seasonal allergic rhinitis due to pollen   ? ?Past Surgical History:  ?Past Surgical History:  ?Procedure Laterality Date  ? COLONOSCOPY    ? ELBOW SURGERY    ? pinched nerve on both arms  ? INGUINAL HERNIA REPAIR Right 10/30/2018  ? Procedure: OPEN REPAIR OF INCARCERATED RIGHT INGUINAL HERNIA WITH MESH;  Surgeon: WGreer Pickerel MD;  [facility]  Service: General;  Laterality: Right;  ? LEFT HEART CATH AND CORONARY ANGIOGRAPHY N/A 01/15/2021  ? Procedure: LEFT HEART CATH AND CORONARY ANGIOGRAPHY;  Surgeon: GAdrian Prows MD;  [facility];  Service: Cardiovascular;  Laterality: N/A;  ? TEE WITHOUT CARDIOVERSION N/A 12/24/2021  ? Procedure: TRANSESOPHAGEAL ECHOCARDIOGRAM (TEE);  Surgeon: CSherren Mocha MD;  [facility]  Service: Open Heart Surgery;  Laterality: N/A;  ? 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  ? Procedure: TOTAL KNEE ARTHROPLASTY;  Surgeon:  CSydnee Cabal MD;  [facility];  Service: Orthopedics;  Laterality: Right;  with adductor canal  ? WRIST SURGERY    ? pinched nerve  ? ?HPI:  ?Nicholas Shelton is a 81y.o. male who presents with 3 weeks of diploplia, found to have branch retinal artery occlusion and sent to the ED by ophthalmology for stroke work-up. MRI (4/12) negative for acute intracranial abnormality. MRA head 4/12 revealed "4 mm saccular left PCOM aneurysm, no large vessel occlusion, mild intracranial atherosclerotic disease". [place]4/12. PMH is significant for TAVR with bioprosthetic valve, HTN, HFrEF, HLD, DM2, and GERD.  ?  ?Assessment / Plan / Recommendation  ?Clinical Impression ? Nicholas Shelton presents with functional oropharyngeal swallow at bedside. Oral motor function is WFL. No overt s/sx of aspiration noted across all PO trials, including with x2 trials of consecutive swallowing with thin liquids (~3oz each). Recommend continue regular/thin liquid diet. No further SLP f/u warranted at this time. Will s/o. ? ?SLP Visit Diagnosis: Dysphagia, unspecified (R13.10) ?   ?Aspiration Risk ? No limitations  ?  ?Diet Recommendation Regular;Thin liquid  ? ?Liquid Administration via: Cup;Straw ?Medication Administration: Whole meds with liquid ?Supervision: Patient able to self feed ?Compensations: Slow rate;Small sips/bites ?Postural Changes: Seated upright at 90 degrees;Remain upright for at least 30 minutes after po intake  ?  ?Other  Recommendations Oral Care Recommendations: Oral care BID   ? ?Recommendations for follow up therapy are  one component of a multi-disciplinary discharge planning process, led by the attending physician.  Recommendations may be updated based on patient status, additional functional criteria and insurance authorization. ? ?Follow up Recommendations No SLP follow up  ? ? ?  ?Assistance Recommended at Discharge PRN  ?Functional Status Assessment Patient has not had a recent decline in their functional status   ?Frequency and Duration    ?  ?  ?   ? ?Prognosis    ? ?  ? ?Swallow Study   ?General Date of Onset: 02/19/22 ?HPI: Nicholas Shelton is a 81 y.o. male who presents with 3 weeks of diploplia, found to have branch retinal artery occlusion and sent to the ED by ophthalmology for stroke work-up. MRI (4/12) negative for acute intracranial abnormality. MRA head 4/12 revealed "4 mm saccular left PCOM aneurysm, no large vessel occlusion, mild intracranial atherosclerotic disease". [place] 4/12. PMH is significant for TAVR with bioprosthetic valve, HTN, HFrEF, HLD, DM2, and GERD. ?Type of Study: Bedside Swallow Evaluation ?Previous Swallow Assessment: none per EMR ?Diet Prior to this Study: Regular;Thin liquids ?Temperature Spikes Noted: Yes (99.2) ?Respiratory Status: Room air ?History of Recent Intubation: No ?Behavior/Cognition: Alert;Cooperative;Pleasant mood ?Oral Cavity Assessment: Within Functional Limits ?Oral Care Completed by SLP: No ?Oral Cavity - Dentition: Dentures, bottom;Dentures, top ?Vision: Functional for self-feeding ?Self-Feeding Abilities: Able to feed self ?Patient Positioning: Upright in bed;Postural control adequate for testing ?Baseline Vocal Quality: Normal ?Volitional Cough: Strong ?Volitional Swallow: Able to elicit  ?  ?Oral/Motor/Sensory Function Overall Oral Motor/Sensory Function: Within functional limits   ?Ice Chips Ice chips: Not tested   ?Thin Liquid Thin Liquid: Within functional limits ?Presentation: Self Fed;Straw  ?  ?Nectar Thick Nectar Thick Liquid: Not tested   ?Honey Thick Honey Thick Liquid: Not tested   ?Puree Puree: Within functional limits ?Presentation: Self Fed;Spoon   ?Solid ? ? ? ? ?  Solid: Within functional limits ?Presentation: Self Fed  ? ?  ?Ellwood Dense, MA, CCC-SLP ?Acute Rehabilitation Services ?Office Number: [phone] ? ?Acie Fredrickson ?02/20/2022,11:42 AM ? ? ? ? ?

## 2022-02-25 ENCOUNTER — Other Ambulatory Visit (HOSPITAL_COMMUNITY): Payer: Self-pay

## 2022-02-25 ENCOUNTER — Telehealth (HOSPITAL_COMMUNITY): Payer: Self-pay

## 2022-02-25 NOTE — Telephone Encounter (Signed)
Pharmacy Transitions of Care Follow-up Telephone Call ? ?Date of discharge: 02/20/2022  ?Discharge Diagnosis: retinal arterial branch occlusion ? ?How have you been since you were released from the hospital? Patient has started taking his Plavix with aspirin today. He is hard of hearing but has otherwise been fine since discharge.  ? ?TAKE these medications   ?  ?acetaminophen 500 MG tablet ?Commonly known as: TYLENOL ?Take 500 mg by mouth every 8 (eight) hours as needed for moderate pain. ?   ?aspirin EC 81 MG tablet ?Take 1 tablet (81 mg total) by mouth at bedtime for 21 days. Swallow whole. ?   ?atorvastatin 20 MG tablet ?Commonly known as: LIPITOR ?Take 1 tablet (20 mg total) by mouth 2 (two) times a week. ?What changed:  ?when to take this ?additional instructions ?   ?carvedilol 3.125 MG tablet ?Commonly known as: COREG ?Take 1 tablet (3.125 mg total) by mouth 2 (two) times daily with a meal. ?   ?cloNIDine 0.2 MG tablet ?Commonly known as: Catapres ?Take 1 tablet (0.2 mg total) by mouth 2 (two) times daily. ?What changed:  ?how much to take ?when to take this ?   ?clopidogrel 75 MG tablet ?Commonly known as: PLAVIX ?Take 1 tablet (75 mg total) by mouth daily. ?   ?Entresto 97-103 MG ?Generic drug: sacubitril-valsartan ?Take 1 tablet by mouth 2 (two) times daily. ?What changed: when to take this ?   ?furosemide 20 MG tablet ?Commonly known as: LASIX ?Take 1 tablet (20 mg total) by mouth daily. ?   ?hydrALAZINE 100 MG tablet ?Commonly known as: APRESOLINE ?Take 1 tablet (100 mg total) by mouth 3 (three) times daily. ?   ?hydrochlorothiazide 25 MG tablet ?Commonly known as: HYDRODIURIL ?Take 25 mg by mouth daily. ?   ?isosorbide dinitrate 30 MG tablet ?Commonly known as: ISORDIL ?Take 1 tablet (30 mg total) by mouth 3 (three) times daily. ?What changed: when to take this ?   ?metFORMIN 1000 MG tablet ?Commonly known as: GLUCOPHAGE ?Take 1 tablet (1,000 mg total) by mouth 2 (two) times daily with a meal. ?    ?pantoprazole 40 MG tablet ?Commonly known as: PROTONIX ?Take 40 mg by mouth daily before breakfast.  ? ? ?Medication changes verified by the patient? yes ?  ? ?Medication Accessibility: ? ?Home Pharmacy: [facility], [place] ? ?Was the patient provided with refills on discharged medications? Yes   ? ?Have all prescriptions been transferred from [facility] to home pharmacy? Yes  ? ?Is the patient able to afford medications? Yes ?  ? ?Medication Review: ? ?CLOPIDOGREL (PLAVIX) ?Clopidogrel 75 mg once daily.  ? Per MD, continue with DAPT therapy, ASA + Plavix for 3 weeks and then continue with the Plavix alone. ?- Reviewed potential DDIs with patient  ?- Advised patient of medications to avoid (NSAIDs, ASA)  ?- Educated that Tylenol (acetaminophen) will be the preferred analgesic to prevent risk of bleeding  ?- Emphasized importance of monitoring for signs and symptoms of bleeding (abnormal bruising, prolonged bleeding, nose bleeds, bleeding from gums, discolored urine, black tarry stools)  ?- Advised patient to alert all providers of anticoagulation therapy prior to starting a new medication or having a procedure  ? ? ?Follow-up Appointments: ? ?[facility] f/u appt confirmed? Cardiology -  Scheduled to see Sunit Tolia on 03/03/2022.  ? ?If their condition worsens, is the pt aware to call PCP or go to the Emergency Dept.? Yes ? ?Final Patient Assessment: ?-Pt is doing well.  ?-Pt verbalized  understanding of Plavix.  ?-Pt has post discharge appointment and refill sent to [facility] in [place], [place]. ? ? ?

## 2022-03-03 ENCOUNTER — Inpatient Hospital Stay: Payer: Medicare Other

## 2022-03-03 ENCOUNTER — Encounter: Payer: Self-pay | Admitting: Cardiology

## 2022-03-03 ENCOUNTER — Ambulatory Visit
Admission: RE | Admit: 2022-03-03 | Discharge: 2022-03-03 | Disposition: A | Discharge status: Home / Self Care | Payer: Medicare Other | Source: Ambulatory Visit | Attending: Cardiology | Admitting: Cardiology

## 2022-03-03 ENCOUNTER — Ambulatory Visit: Payer: Medicare Other | Admitting: Cardiology

## 2022-03-03 VITALS — BP 183/83 | HR 60 | Temp 97.9°F | Resp 16 | Ht 71.0 in | Wt 189.0 lb

## 2022-03-03 DIAGNOSIS — I10 Essential (primary) hypertension: Secondary | ICD-10-CM

## 2022-03-03 DIAGNOSIS — I350 Nonrheumatic aortic (valve) stenosis: Secondary | ICD-10-CM

## 2022-03-03 DIAGNOSIS — I480 Paroxysmal atrial fibrillation: Secondary | ICD-10-CM

## 2022-03-03 DIAGNOSIS — Z7901 Long term (current) use of anticoagulants: Secondary | ICD-10-CM

## 2022-03-03 DIAGNOSIS — Z79899 Other long term (current) drug therapy: Secondary | ICD-10-CM

## 2022-03-03 DIAGNOSIS — Z8673 Personal history of transient ischemic attack (TIA), and cerebral infarction without residual deficits: Secondary | ICD-10-CM

## 2022-03-03 DIAGNOSIS — E782 Mixed hyperlipidemia: Secondary | ICD-10-CM

## 2022-03-03 DIAGNOSIS — Z87891 Personal history of nicotine dependence: Secondary | ICD-10-CM

## 2022-03-03 DIAGNOSIS — I4891 Unspecified atrial fibrillation: Secondary | ICD-10-CM

## 2022-03-03 DIAGNOSIS — Z952 Presence of prosthetic heart valve: Secondary | ICD-10-CM

## 2022-03-03 DIAGNOSIS — E1165 Type 2 diabetes mellitus with hyperglycemia: Secondary | ICD-10-CM

## 2022-03-03 DIAGNOSIS — I428 Other cardiomyopathies: Secondary | ICD-10-CM

## 2022-03-03 DIAGNOSIS — I5022 Chronic systolic (congestive) heart failure: Secondary | ICD-10-CM

## 2022-03-03 HISTORY — DX: Paroxysmal atrial fibrillation: I48.0

## 2022-03-03 IMAGING — DX DG CHEST 2V
2 series · 2 of 2 positions shown · non-contrast
Comparison: [DATE]

CLINICAL DATA: 80-year-old male with amiodarone

EXAM:
CHEST - 2 VIEW

[dg chest 2 view (1 of 2)]
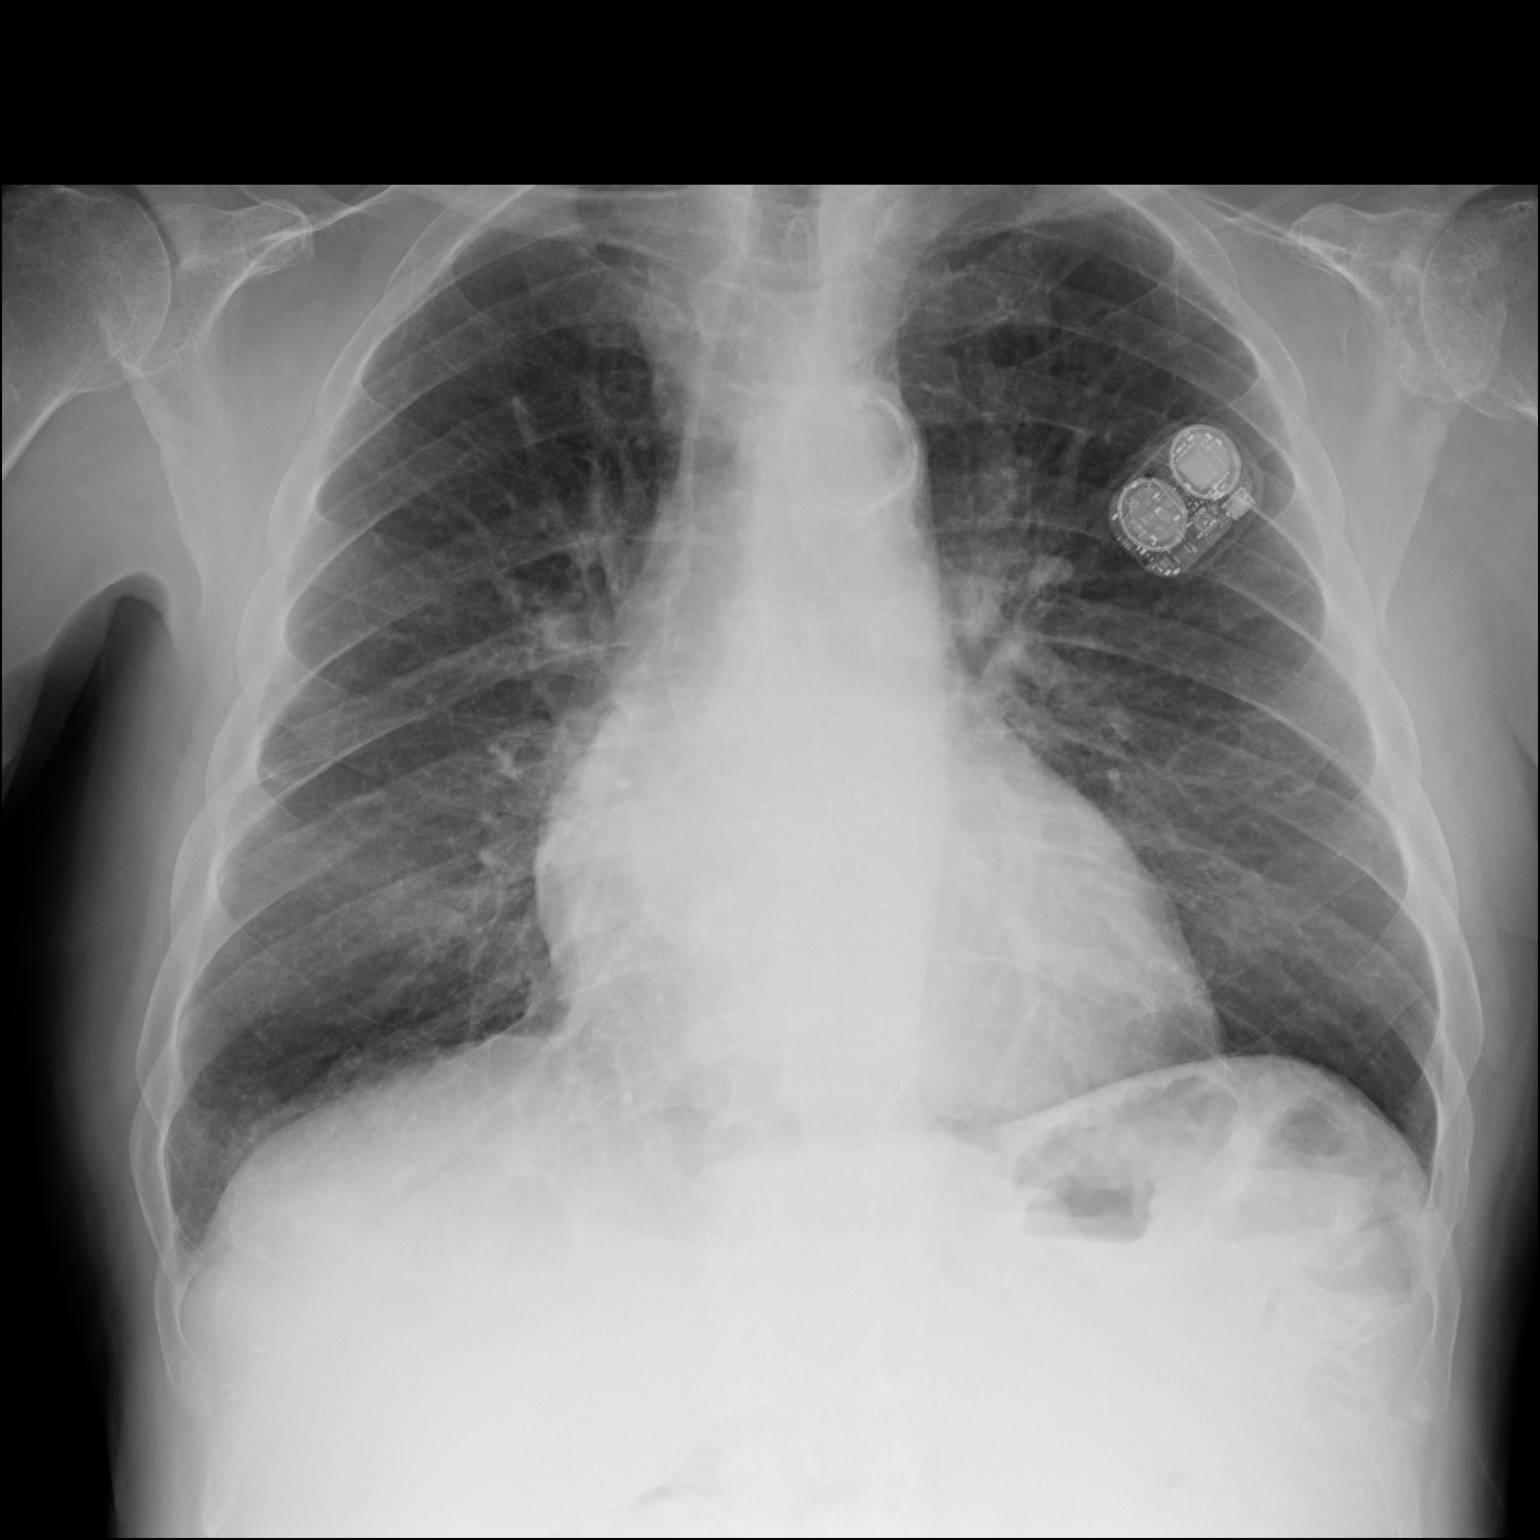

[dg chest 2 view (2 of 2)]
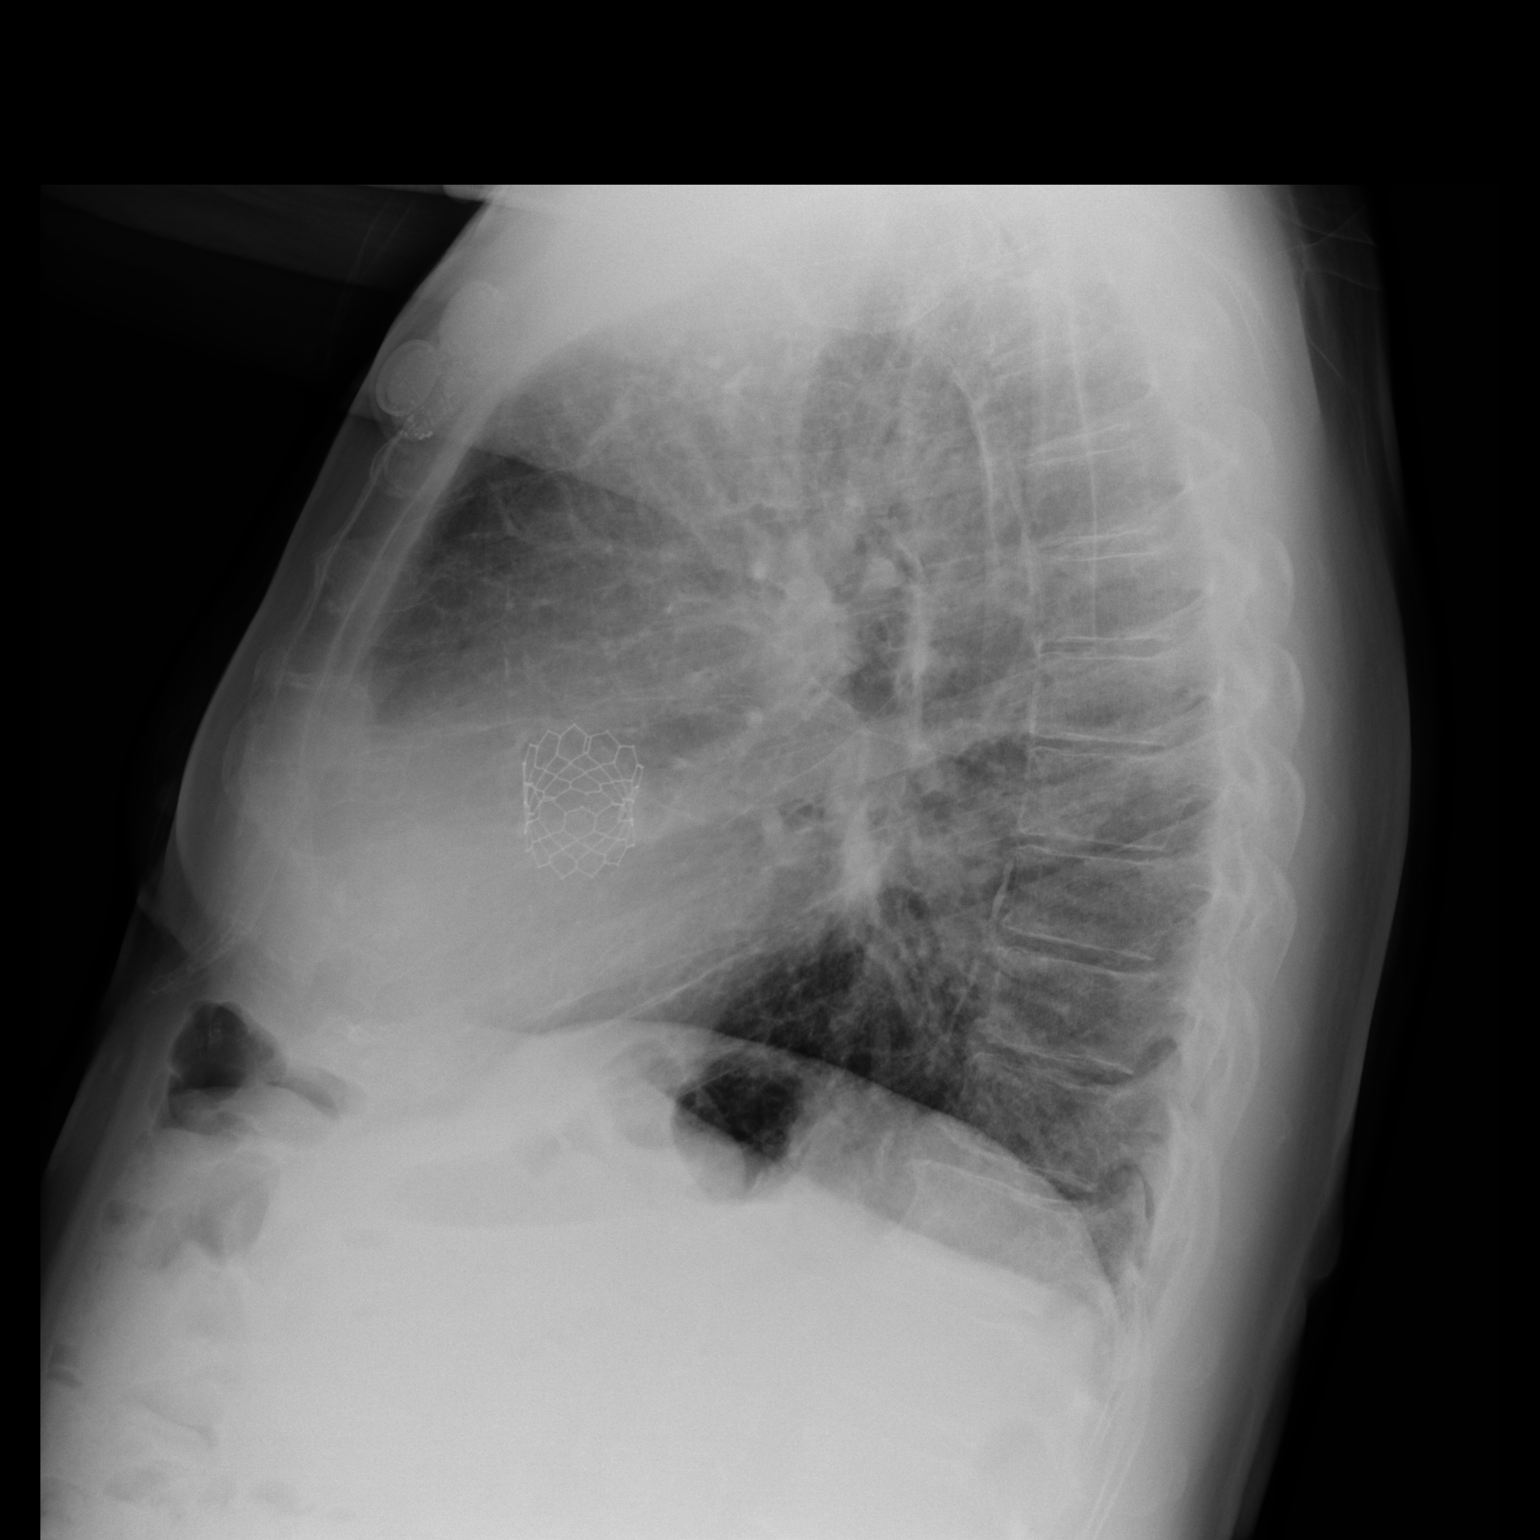

[2 of 2 positions shown; findings below may reference images not displayed]

FINDINGS: Cardiomediastinal silhouette unchanged in size and contour. No
evidence of central vascular congestion. No interlobular septal
thickening.

Interval placement of event recorder on the left chest wall, with
interval TAVR.

No pneumothorax or pleural effusion. Coarsened interstitial
markings, with no confluent airspace disease.

No acute displaced fracture. Degenerative changes of the spine.
IMPRESSION: Negative for acute cardiopulmonary disease or significant fibrotic
changes.

Interval TAVR and placement of left chest wall cardiac event
recorder

## 2022-03-03 MED ORDER — APIXABAN 5 MG PO TABS: 5.0000 mg | ORAL_TABLET | Freq: Two times a day (BID) | ORAL | 0 refills | Status: DC

## 2022-03-03 MED ORDER — AMIODARONE HCL 200 MG PO TABS: 400.0000 mg | ORAL_TABLET | Freq: Two times a day (BID) | ORAL | 0 refills | Status: DC

## 2022-03-03 MED ORDER — SPIRONOLACTONE 25 MG PO TABS: 25.0000 mg | ORAL_TABLET | Freq: Every morning | ORAL | 0 refills | Status: DC

## 2022-03-03 NOTE — Progress Notes (Signed)
? ?ID:  Nicholas Shelton, DOB Oct 15, 1941, MRN [id number] ? ?PCP:  Christain Sacramento, MD  ?Cardiologist:  Rex Kras, DO, [facility] (established care 12/13/2020) ?Cardiac Electrophysiologist: Dr. Virl Axe.  ? ?Date: 03/03/22 ?Last Office Visit: 10/31/2021 ? ?Chief Complaint  ?Patient presents with  ? heart failure management  ? Follow-up  ? ? ?HPI  ?Nicholas Shelton is a 81 y.o. male whose past medical history and cardiovascular risk factors include: Hx of aortic stenosis s/p 80m Edward Sapien 3 Valve, heart failure with reduced EF, retinal artery branch occlusion, atrial fibrillation, saccular left PCOM aneurysm, hypertension, hyperlipidemia, non-insulin-dependent diabetes mellitus type 2, former smoker, advanced age. ? ?Given his history of aortic stenosis he is being managed medically with serial echocardiograms until his LVEF had reduced and therefore was referred to structural heart team for TAVR evaluation.  He is status post 29 mm Edwards SAPIEN 3 valve implantation and doing well postprocedure.  Given the reduced LVEF patient's blood pressure medications have been uptitrated in a stepwise fashion and being treated for chronic HFpEF.  At last office visit we reduce his dose of clonidine and started BiDil which is tolerated well with the exception of isosorbide and nitrate causing headaches.  He is currently maxed out on hydralazine. ? ?Since last office visit patient went for routine eye examination given his chronic vision changes and was noted to have retinal artery branch occlusion was sent to the ED for further evaluation and management by his ophthalmologist.  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  He was sent home on dual antiplatelet therapy and given his LDL levels recommended high intensity statin the patient chose to continue current dose. ? ?Since discharge from the hospital patient has followed up with PCP and now presents to the office for  further titration of blood pressure medications & HFrEF management.  ? ?After reviewing his recent hospitalization records I did not see any EKG being performed at the hospital and given his MRI findings of bilateral chronic strokes I performed an EKG today which notes atrial fibrillation.  This is a new diagnosis for the patient and therefore spent a great deal of time discussing the pathophysiology, treatment strategy, and the purpose of thromboembolic prophylaxis for stroke prevention. ? ?ALLERGIES: ?Allergies  ?Allergen Reactions  ? Trazodone And Nefazodone Other (See Comments)  ?  Sluggishness the next morning  ? ? ?MEDICATION LIST PRIOR TO VISIT: ?Current Meds  ?Medication Sig  ? acetaminophen (TYLENOL) 500 MG tablet Take 500 mg by mouth every 8 (eight) hours as needed for moderate pain.  ? amiodarone (PACERONE) 200 MG tablet Take 2 tablets (400 mg total) by mouth 2 (two) times daily.  ? apixaban (ELIQUIS) 5 MG TABS tablet Take 1 tablet (5 mg total) by mouth 2 (two) times daily.  ? aspirin EC 81 MG tablet Take 1 tablet (81 mg total) by mouth at bedtime for 21 days. Swallow whole.  ? atorvastatin (LIPITOR) 20 MG tablet Take 1 tablet (20 mg total) by mouth 2 (two) times a week. (Patient taking differently: Take 10 mg by mouth daily at 12 noon.)  ? carvedilol (COREG) 3.125 MG tablet Take 1 tablet (3.125 mg total) by mouth 2 (two) times daily with a meal.  ? cloNIDine (CATAPRES) 0.2 MG tablet Take 1 tablet (0.2 mg total) by mouth 2 (two) times daily. (Patient taking differently: Take 0.1 mg by mouth in the morning and at bedtime.)  ? furosemide (LASIX) 20  MG tablet Take 1 tablet (20 mg total) by mouth daily.  ? hydrALAZINE (APRESOLINE) 100 MG tablet Take 1 tablet (100 mg total) by mouth 3 (three) times daily.  ? hydrochlorothiazide (HYDRODIURIL) 25 MG tablet Take 25 mg by mouth daily.  ? isosorbide dinitrate (ISORDIL) 30 MG tablet Take 1 tablet (30 mg total) by mouth 3 (three) times daily. (Patient taking  differently: Take 30 mg by mouth 2 (two) times daily.)  ? metFORMIN (GLUCOPHAGE) 1000 MG tablet Take 1 tablet (1,000 mg total) by mouth 2 (two) times daily with a meal.  ? Multiple Vitamin (MULTI VITAMIN MENS PO) Take by mouth.  ? pantoprazole (PROTONIX) 40 MG tablet Take 40 mg by mouth daily before breakfast.  ? Psyllium 28.3 % POWD Take by mouth.  ? sacubitril-valsartan (ENTRESTO) 97-103 MG Take 1 tablet by mouth 2 (two) times daily. (Patient taking differently: Take 1 tablet by mouth in the morning and at bedtime.)  ? spironolactone (ALDACTONE) 25 MG tablet Take 1 tablet (25 mg total) by mouth every morning.  ? [DISCONTINUED] clopidogrel (PLAVIX) 75 MG tablet Take 1 tablet (75 mg total) by mouth daily.  ?  ? ?PAST MEDICAL HISTORY: ?Past Medical History:  ?Diagnosis Date  ? Actinic keratosis   ? Arthritis   ? AV block   ? Bradycardia   ? Degeneration of lumbar or lumbosacral intervertebral disc   ? Deviated nasal septum   ? Diabetes mellitus without complication ([place])   ? Gastroesophageal reflux disease without esophagitis   ? Generalized anxiety disorder   ? Gout with tophus   ? Hearing loss   ? Hyperlipidemia   ? Hypertension   ? Insomnia   ? Murmur   ? Aortic stenosis  ? Nasal turbinate hypertrophy   ? Osteoarthritis of wrist   ? PAC (premature atrial contraction)   ? Peptic ulcer   ? Peripheral neuropathy   ? PVC (premature ventricular contraction)   ? 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 with 83m S3AUR via [place] approach with Dr.Cooper and Dr. BCyndia Bent ? Seasonal allergic rhinitis due to pollen   ? ? ?PAST SURGICAL HISTORY: ?Past Surgical History:  ?Procedure Laterality Date  ? COLONOSCOPY    ? ELBOW SURGERY    ? pinched nerve on both arms  ? INGUINAL HERNIA REPAIR Right 10/30/2018  ? Procedure: OPEN REPAIR OF INCARCERATED RIGHT INGUINAL HERNIA WITH MESH;  Surgeon: WGreer Pickerel MD;  [facility]  Service: General;  Laterality: Right;  ? LEFT HEART CATH AND CORONARY ANGIOGRAPHY N/A  01/15/2021  ? Procedure: LEFT HEART CATH AND CORONARY ANGIOGRAPHY;  Surgeon: GAdrian Prows MD;  [facility];  Service: Cardiovascular;  Laterality: N/A;  ? TEE WITHOUT CARDIOVERSION N/A 12/24/2021  ? Procedure: TRANSESOPHAGEAL ECHOCARDIOGRAM (TEE);  Surgeon: CSherren Mocha MD;  Location: [facility]  Service: Open Heart Surgery;  Laterality: N/A;  ? 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  ? Procedure: TOTAL KNEE ARTHROPLASTY;  Surgeon: CSydnee Cabal MD;  [facility]: [facility];  Service: Orthopedics;  Laterality: Right;  with adductor canal  ? WRIST SURGERY    ? pinched nerve  ? ? ?FAMILY HISTORY: ?The patient family history includes Heart attack in his mother. ? ?SOCIAL HISTORY:  ?The patient  reports that he quit smoking about 27 years ago. His smoking use included cigarettes. He has a 7.50 pack-year smoking history. He has never used smokeless tobacco. He reports that he does not drink alcohol and does not use drugs. ? ?  REVIEW OF SYSTEMS: ?Review of Systems  ?Constitutional: Negative for chills and fever.  ?HENT:  Negative for hoarse voice and nosebleeds.   ?     Difficulty hearing  ?Eyes:  Negative for discharge, double vision and pain.  ?Cardiovascular:  Negative for chest pain, claudication, dyspnea on exertion, leg swelling, near-syncope, orthopnea, palpitations, paroxysmal nocturnal dyspnea and syncope.  ?Respiratory:  Negative for hemoptysis and shortness of breath.   ?Musculoskeletal:  Negative for muscle cramps and myalgias.  ?Gastrointestinal:  Negative for abdominal pain, constipation, diarrhea, hematemesis, hematochezia, melena, nausea and vomiting.  ?Neurological:  Negative for dizziness and light-headedness.  ? ?PHYSICAL EXAM: ? ?  03/03/2022  ? 10:20 AM 03/03/2022  ? 10:12 AM 02/20/2022  ? 12:15 PM  ?Vitals with BMI  ?Height  '5\' 11"'$    ?Weight  189 lbs   ?BMI  26.37   ?Systolic [phone]  ?Diastolic 83 54 60  ?Pulse 60 64 64  ? ?CONSTITUTIONAL: Well-developed and well-nourished. No acute  distress.  ?SKIN: Skin is warm and dry. No rash noted. No cyanosis. No pallor. No jaundice ?HEAD: Normocephalic and atraumatic.  ?EYES: No scleral icterus ?MOUTH/THROAT: Moist oral membranes.  ?NECK: No JVD present.

## 2022-03-04 LAB — CMP14+EGFR
ALT: 18 IU/L (ref 0–44)
AST: 21 IU/L (ref 0–40)
Albumin/Globulin Ratio: 1.5 (ref 1.2–2.2)
Albumin: 4.1 g/dL (ref 3.6–4.6)
Alkaline Phosphatase: 110 IU/L (ref 44–121)
BUN/Creatinine Ratio: 26 — ABNORMAL HIGH (ref 10–24)
BUN: 29 mg/dL — ABNORMAL HIGH (ref 8–27)
Bilirubin Total: 0.4 mg/dL (ref 0.0–1.2)
CO2: 23 mmol/L (ref 20–29)
Calcium: 9.4 mg/dL (ref 8.6–10.2)
Chloride: 103 mmol/L (ref 96–106)
Creatinine, Ser: 1.13 mg/dL (ref 0.76–1.27)
Globulin, Total: 2.7 g/dL (ref 1.5–4.5)
Glucose: 121 mg/dL — ABNORMAL HIGH (ref 70–99)
Potassium: 5.1 mmol/L (ref 3.5–5.2)
Sodium: 140 mmol/L (ref 134–144)
Total Protein: 6.8 g/dL (ref 6.0–8.5)
eGFR: 65 mL/min/{1.73_m2} (ref >59)

## 2022-03-04 LAB — TSH: TSH: 0.602 u[IU]/mL (ref 0.450–4.500)

## 2022-03-04 LAB — MAGNESIUM: Magnesium: 2.4 mg/dL — ABNORMAL HIGH (ref 1.6–2.3)

## 2022-03-04 LAB — PRO B NATRIURETIC PEPTIDE: NT-Pro BNP: 4258 pg/mL — ABNORMAL HIGH (ref 0–486)

## 2022-03-04 LAB — HEMOGLOBIN AND HEMATOCRIT, BLOOD
Hematocrit: 32 % — ABNORMAL LOW (ref 37.5–51.0)
Hemoglobin: 10.6 g/dL — ABNORMAL LOW (ref 13.0–17.7)

## 2022-03-06 ENCOUNTER — Telehealth: Payer: Self-pay | Admitting: Pharmacist

## 2022-03-06 ENCOUNTER — Other Ambulatory Visit: Payer: Self-pay | Admitting: Cardiology

## 2022-03-06 ENCOUNTER — Other Ambulatory Visit (HOSPITAL_COMMUNITY): Payer: Self-pay | Admitting: Neuroradiology

## 2022-03-06 DIAGNOSIS — I5022 Chronic systolic (congestive) heart failure: Secondary | ICD-10-CM

## 2022-03-06 DIAGNOSIS — I671 Cerebral aneurysm, nonruptured: Secondary | ICD-10-CM

## 2022-03-06 NOTE — Telephone Encounter (Signed)
Recent lab results reviewed with pt's sister in law. Renal functions and electrolytes stable. Baseline TSH noted to be WNL prior to start amiodarone 400 mg BID. Hbg/HCT noted to be trending down, but appears be near pt's baseline. NT-proBNP continues to trend up. Pt denies any complains of worsening lower extremity swelling or SOB/dyspnea concerns. Not currently tracking his home weight routinely. Reports to staying compliant to carvedilol 3.125 mg BID, clonidine 0.1 mg daily, lasix 20 mg PRN, hydralazine 100 mg TID, HCTZ 25 mg, isordil 30 mg BID, Entresto 97/103 mg BID, spironolactone 25 mg. Continues to deny any complains of CP, palpitations, lightheadedness, dizziness, syncope like episodes. Home BP continues to remain elevate, but trending down. 2 week Avg BP: 159/74 HR: 55. New start spironolactone, eliquis, and amiodarone 400 mg BID. Recommended pt to continue current therapy and continue monitoring pt's symptoms. Will request repeat labs in 1 week to re-evaluate pt's elevated Nt-proBNP and BMP changes since starting spironolactone.  ? ? ?

## 2022-03-07 ENCOUNTER — Ambulatory Visit (HOSPITAL_COMMUNITY)
Admission: RE | Admit: 2022-03-07 | Discharge: 2022-03-07 | Disposition: A | Discharge status: Home / Self Care | Payer: Medicare Other | Source: Ambulatory Visit | Attending: Neuroradiology | Admitting: Neuroradiology

## 2022-03-07 NOTE — Progress Notes (Signed)
Pt's sister-in-law to cancel Radiology  ?Evaluation with Dr Karenann Cai, they will call to reschedule ?

## 2022-03-11 ENCOUNTER — Ambulatory Visit (HOSPITAL_COMMUNITY)
Admission: RE | Admit: 2022-03-11 | Discharge: 2022-03-11 | Disposition: A | Discharge status: Home / Self Care | Payer: Medicare Other | Source: Ambulatory Visit | Attending: Neuroradiology | Admitting: Neuroradiology

## 2022-03-11 DIAGNOSIS — I671 Cerebral aneurysm, nonruptured: Secondary | ICD-10-CM

## 2022-03-12 NOTE — Consult Note (Signed)
? ?Chief Complaint: Patient was seen in consultation today for left ICA aneurysm. ? ?Referring Physician(s): Antony Contras. ? ?Supervising Physician: [name] ? ?Patient Status: [facility] - Out-pt ? ?History of Present Illness: ?Nicholas Shelton is a 81 year old male with past medical history significant for hypertension, hyperlipidemia, TAVR, gout, generalized anxiety disorder, diabetes and bioprosthetic aortic valve.  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  He then presented to the emergency room for stroke workup.  MRI and MRA of the brain were obtained and were negative for acute stroke.  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  He denies diplopia or other focal neurological deficit.  No family history of brain aneurysm or subarachnoid hemorrhage.  He is a former smoker (19 pack-year), quitting in 1996. He is currently on Eliquis and aspirin. His sister in law is present during consult. ? ?Past Medical History:  ?Diagnosis Date  ? Actinic keratosis   ? Arthritis   ? AV block   ? Bradycardia   ? Degeneration of lumbar or lumbosacral intervertebral disc   ? Deviated nasal septum   ? Diabetes mellitus without complication ([place])   ? Gastroesophageal reflux disease without esophagitis   ? Generalized anxiety disorder   ? Gout with tophus   ? Hearing loss   ? Hyperlipidemia   ? Hypertension   ? Insomnia   ? Murmur   ? Aortic stenosis  ? Nasal turbinate hypertrophy   ? Osteoarthritis of wrist   ? PAC (premature atrial contraction)   ? Peptic ulcer   ? Peripheral neuropathy   ? PVC (premature ventricular contraction)   ? 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 with 24m S3AUR via [place] approach with Dr.Cooper and Dr. BCyndia Bent ? Seasonal allergic rhinitis due to pollen   ? ? ?Past Surgical History:  ?Procedure Laterality Date  ? COLONOSCOPY    ? ELBOW SURGERY    ?  pinched nerve on both arms  ? INGUINAL HERNIA REPAIR Right 10/30/2018  ? Procedure: OPEN REPAIR OF INCARCERATED RIGHT INGUINAL HERNIA WITH MESH;  Surgeon: WGreer Pickerel MD;  [facility] [facility]  Service: General;  Laterality: Right;  ? LEFT HEART CATH AND CORONARY ANGIOGRAPHY N/A 01/15/2021  ? Procedure: LEFT HEART CATH AND CORONARY ANGIOGRAPHY;  Surgeon: GAdrian Prows MD;  [facility];  Service: Cardiovascular;  Laterality: N/A;  ? TEE WITHOUT CARDIOVERSION N/A 12/24/2021  ? Procedure: TRANSESOPHAGEAL ECHOCARDIOGRAM (TEE);  Surgeon: CSherren Mocha MD;  [facility]  Service: Open Heart Surgery;  Laterality: N/A;  ? 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  ? Procedure: TOTAL KNEE ARTHROPLASTY;  Surgeon: CSydnee Cabal MD;  [facility];  Service: Orthopedics;  Laterality: Right;  with adductor canal  ? WRIST SURGERY    ? pinched nerve  ? ? ?Allergies: ?Trazodone and nefazodone ? ?Medications: ?Prior to Admission medications   ?Medication Sig Start Date End Date Taking? Authorizing Provider  ?acetaminophen (TYLENOL) 500 MG tablet Take 500 mg by mouth every 8 (eight) hours as needed for moderate pain.    [provider]  ?amiodarone (PACERONE) 200 MG tablet Take 2 tablets (400 mg total) by mouth 2 (two) times daily. 03/03/22 04/02/22  Tolia, Sunit, DO  ?apixaban (ELIQUIS) 5 MG TABS tablet Take 1 tablet (5 mg total) by mouth 2 (two) times daily. 03/03/22 06/01/22  TRex Kras DO  ?aspirin EC 81  MG tablet Take 1 tablet (81 mg total) by mouth at bedtime for 21 days. Swallow whole. 02/20/22 03/13/22  Erskine Emery, MD  ?atorvastatin (LIPITOR) 20 MG tablet Take 1 tablet (20 mg total) by mouth 2 (two) times a week. ?Patient taking differently: Take 10 mg by mouth daily at 12 noon. 11/09/17   Deboraha Sprang, MD  ?carvedilol (COREG) 3.125 MG tablet Take 1 tablet (3.125 mg total) by mouth 2 (two) times daily with a meal. 11/06/17   Deboraha Sprang, MD  ?cloNIDine (CATAPRES) 0.2 MG tablet Take 1  tablet (0.2 mg total) by mouth 2 (two) times daily. ?Patient taking differently: Take 0.1 mg by mouth daily. 01/29/22 04/29/22  Tolia, Sunit, DO  ?furosemide (LASIX) 20 MG tablet Take 1 tablet (20 mg total) by mouth daily. 12/06/21 03/03/22  Sherren Mocha, MD  ?hydrALAZINE (APRESOLINE) 100 MG tablet Take 1 tablet (100 mg total) by mouth 3 (three) times daily. 02/17/22   Tolia, Sunit, DO  ?hydrochlorothiazide (HYDRODIURIL) 25 MG tablet Take 25 mg by mouth daily.    [provider]  ?isosorbide dinitrate (ISORDIL) 30 MG tablet Take 1 tablet (30 mg total) by mouth 3 (three) times daily. ?Patient taking differently: Take 30 mg by mouth 2 (two) times daily. 01/31/22   Tolia, Sunit, DO  ?metFORMIN (GLUCOPHAGE) 1000 MG tablet Take 1 tablet (1,000 mg total) by mouth 2 (two) times daily with a meal. 01/17/21   Adrian Prows, MD  ?Multiple Vitamin (MULTI VITAMIN MENS PO) Take by mouth.    [provider]  ?pantoprazole (PROTONIX) 40 MG tablet Take 40 mg by mouth daily before breakfast.    [provider]  ?Psyllium 28.3 % POWD Take by mouth.    [provider]  ?sacubitril-valsartan (ENTRESTO) 97-103 MG Take 1 tablet by mouth 2 (two) times daily. ?Patient taking differently: Take 1 tablet by mouth in the morning and at bedtime. 12/23/21 03/23/22  Tolia, Sunit, DO  ?spironolactone (ALDACTONE) 25 MG tablet Take 1 tablet (25 mg total) by mouth every morning. 03/03/22 06/01/22  Rex Kras, DO  ?  ? ?Family History  ?Problem Relation Age of Onset  ? Heart attack Mother   ? ? ?Social History  ? ?Socioeconomic History  ? Marital status: Married  ?  Spouse name: Not on file  ? Number of children: 3  ? Years of education: Not on file  ? Highest education level: Not on file  ?Occupational History  ? Not on file  ?Tobacco Use  ? Smoking status: Former  ?  Packs/day: 0.50  ?  Years: 15.00  ?  Pack years: 7.50  ?  Types: Cigarettes  ?  Quit date: 84  ?  Years since quitting: 27.3  ? Smokeless tobacco: Never   ?Vaping Use  ? Vaping Use: Never used  ?Substance and Sexual Activity  ? Alcohol use: No  ? Drug use: No  ? Sexual activity: Not on file  ?  Comment: married  ?Other Topics Concern  ? Not on file  ?Social History Narrative  ? Not on file  ? ?Social Determinants of Health  ? ?Financial Resource Strain: Not on file  ?Food Insecurity: Not on file  ?Transportation Needs: Not on file  ?Physical Activity: Not on file  ?Stress: Not on file  ?Social Connections: Not on file  ? ? ? ?Review of Systems: A 12 point ROS discussed and pertinent positives are indicated in the HPI above.  All other systems are negative. ? ?Review  of Systems ? ?Vital Signs: ?There were no vitals taken for this visit. ? ?Physical Exam ?HENT:  ?   Head: Normocephalic and atraumatic.  ?Eyes:  ?   Extraocular Movements: Extraocular movements intact.  ?   Conjunctiva/sclera: Conjunctivae normal.  ?   Pupils: Pupils are equal, round, and reactive to light.  ?Neurological:  ?   Mental Status: He is alert and oriented to person, place, and time.  ?   Sensory: Sensation is intact.  ?   Motor: Motor function is intact.  ?   Gait: Gait is intact.  ? ? ? ?  ? ? ?Imaging: ?DG Chest 2 View ? ?Result Date: 03/04/2022 ?CLINICAL DATA:  81 year old male with amiodarone EXAM: CHEST - 2 VIEW COMPARISON:  12/20/2021 FINDINGS: Cardiomediastinal silhouette unchanged in size and contour. No evidence of central vascular congestion. No interlobular septal thickening. Interval placement of event recorder on the left chest wall, with interval TAVR. No pneumothorax or pleural effusion. Coarsened interstitial markings, with no confluent airspace disease. No acute displaced fracture. Degenerative changes of the spine. IMPRESSION: Negative for acute cardiopulmonary disease or significant fibrotic changes. Interval TAVR and placement of left chest wall cardiac event recorder Electronically Signed   By: Corrie Mckusick D.O.   On: 03/04/2022 09:29  ? ?MR ANGIO HEAD WO  CONTRAST ? ?Result Date: 02/19/2022 ?CLINICAL DATA:  Initial evaluation for neuro deficit, stroke suspected, diplopia. EXAM: MRA HEAD WITHOUT CONTRAST TECHNIQUE: Angiographic images of the Circle of Willis were acquired using MRA t

## 2022-03-15 LAB — BASIC METABOLIC PANEL
BUN/Creatinine Ratio: 21 (ref 10–24)
BUN: 32 mg/dL — ABNORMAL HIGH (ref 8–27)
CO2: 21 mmol/L (ref 20–29)
Calcium: 9.1 mg/dL (ref 8.6–10.2)
Chloride: 94 mmol/L — ABNORMAL LOW (ref 96–106)
Creatinine, Ser: 1.55 mg/dL — ABNORMAL HIGH (ref 0.76–1.27)
Glucose: 104 mg/dL — ABNORMAL HIGH (ref 70–99)
Potassium: 4.4 mmol/L (ref 3.5–5.2)
Sodium: 129 mmol/L — ABNORMAL LOW (ref 134–144)
eGFR: 44 mL/min/{1.73_m2} — ABNORMAL LOW (ref >59)

## 2022-03-15 LAB — PRO B NATRIURETIC PEPTIDE: NT-Pro BNP: 3403 pg/mL — ABNORMAL HIGH (ref 0–486)

## 2022-03-15 LAB — MAGNESIUM: Magnesium: 2 mg/dL (ref 1.6–2.3)

## 2022-03-18 ENCOUNTER — Other Ambulatory Visit (HOSPITAL_COMMUNITY): Payer: Self-pay | Admitting: Neuroradiology

## 2022-03-18 DIAGNOSIS — I671 Cerebral aneurysm, nonruptured: Secondary | ICD-10-CM

## 2022-03-20 ENCOUNTER — Ambulatory Visit (HOSPITAL_COMMUNITY)
Admission: RE | Admit: 2022-03-20 | Discharge: 2022-03-20 | Disposition: A | Discharge status: Home / Self Care | Payer: Medicare Other | Source: Ambulatory Visit | Attending: Neuroradiology | Admitting: Neuroradiology

## 2022-03-20 ENCOUNTER — Ambulatory Visit: Payer: Medicare Other | Admitting: Student

## 2022-03-20 VITALS — BP 193/80 | HR 58 | Temp 98.2°F | Resp 17 | Ht 71.0 in | Wt 187.0 lb

## 2022-03-20 DIAGNOSIS — I10 Essential (primary) hypertension: Secondary | ICD-10-CM

## 2022-03-20 DIAGNOSIS — I5022 Chronic systolic (congestive) heart failure: Secondary | ICD-10-CM

## 2022-03-20 DIAGNOSIS — I4891 Unspecified atrial fibrillation: Secondary | ICD-10-CM

## 2022-03-20 DIAGNOSIS — I442 Atrioventricular block, complete: Secondary | ICD-10-CM

## 2022-03-20 DIAGNOSIS — I671 Cerebral aneurysm, nonruptured: Secondary | ICD-10-CM

## 2022-03-20 NOTE — Progress Notes (Addendum)
? ?ID:  Nicholas Shelton, DOB 10-20-1941, MRN [id number] ? ?PCP:  Christain Sacramento, MD  ?Cardiologist:  Alethia Berthold, DO, [facility] (established care 12/13/2020) ?Cardiac Electrophysiologist: Dr. Virl Axe.  ? ?Date: 03/21/22 ?Last Office Visit: 10/31/2021 ? ?Chief Complaint  ?Patient presents with  ? Atrial Fibrillation  ? Fatigue  ? Follow-up  ? ? ?HPI  ?Nicholas Shelton is a 81 y.o. male whose past medical history and cardiovascular risk factors include: Hx of aortic stenosis s/p 50m Edward Sapien 3 Valve, heart failure with reduced EF, retinal artery branch occlusion, atrial fibrillation, saccular left PCOM aneurysm, hypertension, hyperlipidemia, non-insulin-dependent diabetes mellitus type 2, former smoker, advanced age. ? ?Patient with history of aortic stenosis, subsequently LVEF had reduced and therefore he underwent TAVR, now status post 29 mm Edwards SAPIEN 3 valve implantation (12/2021).  Patient went for routine eye examination and is found to have retinal artery branch occlusion, therefore was sent to the ED.  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  He subsequently presented to our office for hospital follow-up at which time EKG noted atrial fibrillation on 03/03/2022. ? ?At last office visit, given new onset atrial fibrillation patient was started on carvedilol, amiodarone, and Eliquis. He now presents for urgent visit with complaints of fatigue ongoing for the last 2-3 days. [facility] and heart rate noted to be in the 40s on home monitoring over the last 2-3 days.  ? 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 Blood pressure remains uncontrolled. Denies dizziness, syncope, near syncope.  ? ?ALLERGIES: ?Allergies  ?Allergen Reactions  ? Trazodone And Nefazodone Other (See Comments)  ?   Sluggishness the next morning  ? ? ?MEDICATION LIST PRIOR TO VISIT: ?Current Meds  ?Medication Sig  ? acetaminophen (TYLENOL) 500 MG tablet Take 500 mg by mouth every 8 (eight) hours as needed for moderate pain.  ? apixaban (ELIQUIS) 5 MG TABS tablet Take 1 tablet (5 mg total) by mouth 2 (two) times daily.  ? aspirin EC 81 MG tablet Take 81 mg by mouth daily. Swallow whole.  ? atorvastatin (LIPITOR) 10 MG tablet Take 10 mg by mouth daily.  ? cloNIDine (CATAPRES) 0.2 MG tablet Take 1 tablet (0.2 mg total) by mouth 2 (two) times daily. (Patient taking differently: Take 0.1 mg by mouth daily.)  ? furosemide (LASIX) 20 MG tablet Take 20 mg by mouth.  ? hydrALAZINE (APRESOLINE) 100 MG tablet Take 1 tablet (100 mg total) by mouth 3 (three) times daily.  ? isosorbide dinitrate (ISORDIL) 30 MG tablet Take 1 tablet (30 mg total) by mouth 3 (three) times daily. (Patient taking differently: Take 30 mg by mouth 2 (two) times daily.)  ? metFORMIN (GLUCOPHAGE) 1000 MG tablet Take 1 tablet (1,000 mg total) by mouth 2 (two) times daily with a meal.  ? Multiple Vitamin (MULTI VITAMIN MENS PO) Take by mouth.  ? pantoprazole (PROTONIX) 40 MG tablet Take 40 mg by mouth daily before breakfast.  ? Psyllium 28.3 % POWD Take by mouth.  ? sacubitril-valsartan (ENTRESTO) 97-103 MG Take 1 tablet by mouth 2 (two) times daily. (Patient taking differently: Take 1 tablet by mouth in the morning and at bedtime.)  ? spironolactone (ALDACTONE) 25 MG tablet Take 1 tablet (25 mg total) by mouth every morning.  ? [DISCONTINUED] amiodarone (PACERONE) 200 MG  tablet Take 2 tablets (400 mg total) by mouth 2 (two) times daily. (Patient taking differently: Take 200 mg by mouth 2 (two) times daily.)  ? [DISCONTINUED] carvedilol (COREG) 3.125 MG tablet Take 1 tablet (3.125 mg total) by mouth 2 (two) times daily with a meal.  ? [DISCONTINUED] furosemide (LASIX) 20 MG tablet Take 1 tablet (20 mg total) by mouth daily.  ?  ? ?PAST MEDICAL HISTORY: ?Past Medical  History:  ?Diagnosis Date  ? Actinic keratosis   ? Arthritis   ? AV block   ? Bradycardia   ? Degeneration of lumbar or lumbosacral intervertebral disc   ? Deviated nasal septum   ? Diabetes mellitus without complication ([place])   ? Gastroesophageal reflux disease without esophagitis   ? Generalized anxiety disorder   ? Gout with tophus   ? Hearing loss   ? Hyperlipidemia   ? Hypertension   ? Insomnia   ? Murmur   ? Aortic stenosis  ? Nasal turbinate hypertrophy   ? Osteoarthritis of wrist   ? PAC (premature atrial contraction)   ? Peptic ulcer   ? Peripheral neuropathy   ? PVC (premature ventricular contraction)   ? 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 with 110m S3AUR via [place] approach with Dr.Cooper and Dr. BCyndia Bent ? Seasonal allergic rhinitis due to pollen   ? ? ?PAST SURGICAL HISTORY: ?Past Surgical History:  ?Procedure Laterality Date  ? COLONOSCOPY    ? ELBOW SURGERY    ? pinched nerve on both arms  ? INGUINAL HERNIA REPAIR Right 10/30/2018  ? Procedure: OPEN REPAIR OF INCARCERATED RIGHT INGUINAL HERNIA WITH MESH;  Surgeon: WGreer Pickerel MD;  [place] [facility]  Service: General;  Laterality: Right;  ? LEFT HEART CATH AND CORONARY ANGIOGRAPHY N/A 01/15/2021  ? Procedure: LEFT HEART CATH AND CORONARY ANGIOGRAPHY;  Surgeon: GAdrian Prows MD;  [facility];  Service: Cardiovascular;  Laterality: N/A;  ? TEE WITHOUT CARDIOVERSION N/A 12/24/2021  ? Procedure: TRANSESOPHAGEAL ECHOCARDIOGRAM (TEE);  Surgeon: CSherren Mocha MD;  [facility]  Service: Open Heart Surgery;  Laterality: N/A;  ? 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  ? Procedure: TOTAL KNEE ARTHROPLASTY;  Surgeon: CSydnee Cabal MD;  [facility];  Service: Orthopedics;  Laterality: Right;  with adductor canal  ? WRIST SURGERY    ? pinched nerve  ? ? ?FAMILY HISTORY: ?The patient family history includes Heart attack in his mother. ? ?SOCIAL HISTORY:  ?The patient  reports that he quit smoking about 27 years  ago. His smoking use included cigarettes. He has a 7.50 pack-year smoking history. He has never used smokeless tobacco. He reports that he does not drink alcohol and does not use drugs. ? ?REVIEW OF SYSTEMS: ?Review of Systems  ?Constitutional: Positive for malaise/fatigue. Negative for chills and fever.  ?HENT:  Negative for hoarse voice and nosebleeds.   ?     Difficulty hearing  ?Eyes:  Negative for discharge, double vision and pain.  ?Cardiovascular:  Negative for chest pain, claudication, dyspnea on exertion, leg swelling, near-syncope, orthopnea, palpitations, paroxysmal nocturnal dyspnea and syncope.  ?Respiratory:  Negative for hemoptysis and shortness of breath.   ?Musculoskeletal:  Negative for muscle cramps and myalgias.  ?Gastrointestinal:  Negative for abdominal pain, constipation, diarrhea, hematemesis, hematochezia, melena, nausea and vomiting.  ?Neurological:  Negative for dizziness and light-headedness.  ? ?PHYSICAL EXAM: ? ?  03/20/2022  ?  2:38 PM 03/20/2022  ?  2:16 PM 03/03/2022  ?  10:20 AM  ?Vitals with BMI  ?Height  '5\' 11"'$    ?Weight  187 lbs   ?BMI  26.09   ?Systolic [phone]  ?Diastolic 80 61 83  ?Pulse 58 56 60  ? ?CONSTITUTIONAL: Well-developed and well-nourished. No acute distress.  ?SKIN: Skin is warm and dry. No rash noted. No cyanosis. No pallor. No jaundice ?HEAD: Normocephalic and atraumatic.  ?EYES: No scleral icterus ?MOUTH/THROAT: Moist oral membranes.  ?NECK: No JVD present. No thyromegaly noted. Bilateral carotid bruits  ?LYMPHATIC: No visible cervical adenopathy.  ?CHEST Normal respiratory effort. No intercostal retractions  ?LUNGS: Clear to auscultation bilaterally. No stridor. No wheezes. No rales.  ?CARDIOVASCULAR: Regular, bradycardic, no murmurs, rubs or gallops appreciated. ?ABDOMINAL: Soft, nontender, nondistended, positive bowel sounds in all 4 quadrants, no apparent ascites.  ?EXTREMITIES: No peripheral edema, 2+ DP and PT pulses. ?HEMATOLOGIC: No significant  bruising ?NEUROLOGIC: Oriented to person, place, and time. Nonfocal. Normal muscle tone.  ?PSYCHIATRIC: Normal mood and affect. Normal behavior. Cooperative ? ?RADIOLOGY:  ?MRI brain without contrast: ?02/19/2022 ?1.

## 2022-03-20 NOTE — Progress Notes (Signed)
Reviewed with pt's caregiver. Labs following pt starting spironolactone 25 mg. Recommended pt discontinue HCTZ and decrease spironolactone to 12.5 mg. Pt reports that he is been feeling more fatigued and weak recently since starting amiodarone 200 mg BID. HR have in in the low 40s during these episodes. Pt scheduled to come to the office to review his symptoms and discuss getting repeat EKG done

## 2022-03-21 ENCOUNTER — Encounter: Payer: Self-pay | Admitting: Student

## 2022-03-22 ENCOUNTER — Encounter (HOSPITAL_COMMUNITY): Payer: Self-pay

## 2022-03-22 ENCOUNTER — Emergency Department (HOSPITAL_COMMUNITY): Payer: Medicare Other

## 2022-03-22 ENCOUNTER — Inpatient Hospital Stay (HOSPITAL_COMMUNITY)
Admission: EM | Admit: 2022-03-22 | Discharge: 2022-03-26 | DRG: 305 | Disposition: A | Discharge status: Home / Self Care | Payer: Medicare Other | Attending: Family Medicine | Admitting: Family Medicine

## 2022-03-22 ENCOUNTER — Other Ambulatory Visit: Payer: Self-pay

## 2022-03-22 DIAGNOSIS — Z8249 Family history of ischemic heart disease and other diseases of the circulatory system: Secondary | ICD-10-CM

## 2022-03-22 DIAGNOSIS — Z87891 Personal history of nicotine dependence: Secondary | ICD-10-CM

## 2022-03-22 DIAGNOSIS — J301 Allergic rhinitis due to pollen: Secondary | ICD-10-CM | POA: Diagnosis present

## 2022-03-22 DIAGNOSIS — E871 Hypo-osmolality and hyponatremia: Secondary | ICD-10-CM | POA: Diagnosis present

## 2022-03-22 DIAGNOSIS — D649 Anemia, unspecified: Secondary | ICD-10-CM | POA: Diagnosis present

## 2022-03-22 DIAGNOSIS — E876 Hypokalemia: Secondary | ICD-10-CM | POA: Diagnosis not present

## 2022-03-22 DIAGNOSIS — Z7984 Long term (current) use of oral hypoglycemic drugs: Secondary | ICD-10-CM

## 2022-03-22 DIAGNOSIS — M1A9XX1 Chronic gout, unspecified, with tophus (tophi): Secondary | ICD-10-CM | POA: Diagnosis present

## 2022-03-22 DIAGNOSIS — E7800 Pure hypercholesterolemia, unspecified: Secondary | ICD-10-CM | POA: Diagnosis present

## 2022-03-22 DIAGNOSIS — I10 Essential (primary) hypertension: Secondary | ICD-10-CM | POA: Diagnosis not present

## 2022-03-22 DIAGNOSIS — I428 Other cardiomyopathies: Secondary | ICD-10-CM | POA: Diagnosis present

## 2022-03-22 DIAGNOSIS — K5900 Constipation, unspecified: Secondary | ICD-10-CM | POA: Diagnosis not present

## 2022-03-22 DIAGNOSIS — M5136 Other intervertebral disc degeneration, lumbar region: Secondary | ICD-10-CM | POA: Diagnosis present

## 2022-03-22 DIAGNOSIS — I161 Hypertensive emergency: Principal | ICD-10-CM | POA: Diagnosis present

## 2022-03-22 DIAGNOSIS — Z7901 Long term (current) use of anticoagulants: Secondary | ICD-10-CM

## 2022-03-22 DIAGNOSIS — I5043 Acute on chronic combined systolic (congestive) and diastolic (congestive) heart failure: Secondary | ICD-10-CM | POA: Diagnosis present

## 2022-03-22 DIAGNOSIS — N179 Acute kidney failure, unspecified: Secondary | ICD-10-CM | POA: Diagnosis present

## 2022-03-22 DIAGNOSIS — I442 Atrioventricular block, complete: Secondary | ICD-10-CM | POA: Diagnosis present

## 2022-03-22 DIAGNOSIS — Z96653 Presence of artificial knee joint, bilateral: Secondary | ICD-10-CM | POA: Diagnosis present

## 2022-03-22 DIAGNOSIS — Z8711 Personal history of peptic ulcer disease: Secondary | ICD-10-CM

## 2022-03-22 DIAGNOSIS — N183 Chronic kidney disease, stage 3 unspecified: Secondary | ICD-10-CM | POA: Diagnosis present

## 2022-03-22 DIAGNOSIS — Z8673 Personal history of transient ischemic attack (TIA), and cerebral infarction without residual deficits: Secondary | ICD-10-CM

## 2022-03-22 DIAGNOSIS — H9190 Unspecified hearing loss, unspecified ear: Secondary | ICD-10-CM | POA: Diagnosis present

## 2022-03-22 DIAGNOSIS — E1141 Type 2 diabetes mellitus with diabetic mononeuropathy: Secondary | ICD-10-CM | POA: Diagnosis present

## 2022-03-22 DIAGNOSIS — G4700 Insomnia, unspecified: Secondary | ICD-10-CM | POA: Diagnosis present

## 2022-03-22 DIAGNOSIS — D696 Thrombocytopenia, unspecified: Secondary | ICD-10-CM | POA: Diagnosis present

## 2022-03-22 DIAGNOSIS — K219 Gastro-esophageal reflux disease without esophagitis: Secondary | ICD-10-CM | POA: Diagnosis present

## 2022-03-22 DIAGNOSIS — I5022 Chronic systolic (congestive) heart failure: Secondary | ICD-10-CM

## 2022-03-22 DIAGNOSIS — I130 Hypertensive heart and chronic kidney disease with heart failure and stage 1 through stage 4 chronic kidney disease, or unspecified chronic kidney disease: Secondary | ICD-10-CM | POA: Diagnosis present

## 2022-03-22 DIAGNOSIS — Z79899 Other long term (current) drug therapy: Secondary | ICD-10-CM

## 2022-03-22 DIAGNOSIS — Z888 Allergy status to other drugs, medicaments and biological substances status: Secondary | ICD-10-CM

## 2022-03-22 DIAGNOSIS — Z7982 Long term (current) use of aspirin: Secondary | ICD-10-CM

## 2022-03-22 DIAGNOSIS — Z952 Presence of prosthetic heart valve: Secondary | ICD-10-CM

## 2022-03-22 DIAGNOSIS — K551 Chronic vascular disorders of intestine: Secondary | ICD-10-CM | POA: Diagnosis present

## 2022-03-22 DIAGNOSIS — I671 Cerebral aneurysm, nonruptured: Secondary | ICD-10-CM | POA: Diagnosis present

## 2022-03-22 DIAGNOSIS — E1165 Type 2 diabetes mellitus with hyperglycemia: Secondary | ICD-10-CM | POA: Diagnosis present

## 2022-03-22 DIAGNOSIS — I5042 Chronic combined systolic (congestive) and diastolic (congestive) heart failure: Secondary | ICD-10-CM | POA: Diagnosis present

## 2022-03-22 DIAGNOSIS — I480 Paroxysmal atrial fibrillation: Secondary | ICD-10-CM | POA: Diagnosis present

## 2022-03-22 DIAGNOSIS — F411 Generalized anxiety disorder: Secondary | ICD-10-CM | POA: Diagnosis present

## 2022-03-22 DIAGNOSIS — E1122 Type 2 diabetes mellitus with diabetic chronic kidney disease: Secondary | ICD-10-CM | POA: Diagnosis present

## 2022-03-22 LAB — COMPREHENSIVE METABOLIC PANEL
ALT: 30 U/L (ref 0–44)
AST: 21 U/L (ref 15–41)
Albumin: 3.9 g/dL (ref 3.5–5.0)
Alkaline Phosphatase: 82 U/L (ref 38–126)
Anion gap: 9 (ref 5–15)
BUN: 28 mg/dL — ABNORMAL HIGH (ref 8–23)
CO2: 23 mmol/L (ref 22–32)
Calcium: 9.4 mg/dL (ref 8.9–10.3)
Chloride: 101 mmol/L (ref 98–111)
Creatinine, Ser: 1.36 mg/dL — ABNORMAL HIGH (ref 0.61–1.24)
GFR, Estimated: 52 mL/min — ABNORMAL LOW (ref >60)
Glucose, Bld: 108 mg/dL — ABNORMAL HIGH (ref 70–99)
Potassium: 4.4 mmol/L (ref 3.5–5.1)
Sodium: 133 mmol/L — ABNORMAL LOW (ref 135–145)
Total Bilirubin: 0.9 mg/dL (ref 0.3–1.2)
Total Protein: 6.6 g/dL (ref 6.5–8.1)

## 2022-03-22 LAB — CBC
HCT: 32.8 % — ABNORMAL LOW (ref 39.0–52.0)
Hemoglobin: 11.1 g/dL — ABNORMAL LOW (ref 13.0–17.0)
MCH: 29.6 pg (ref 26.0–34.0)
MCHC: 33.8 g/dL (ref 30.0–36.0)
MCV: 87.5 fL (ref 80.0–100.0)
Platelets: 126 10*3/uL — ABNORMAL LOW (ref 150–400)
RBC: 3.75 MIL/uL — ABNORMAL LOW (ref 4.22–5.81)
RDW: 13.2 % (ref 11.5–15.5)
WBC: 6.5 10*3/uL (ref 4.0–10.5)
nRBC: 0 % (ref 0.0–0.2)

## 2022-03-22 LAB — BRAIN NATRIURETIC PEPTIDE: B Natriuretic Peptide: 4452.1 pg/mL — ABNORMAL HIGH (ref 0.0–100.0)

## 2022-03-22 LAB — RAPID URINE DRUG SCREEN, HOSP PERFORMED
Amphetamines: NOT DETECTED
Barbiturates: NOT DETECTED
Benzodiazepines: NOT DETECTED
Cocaine: NOT DETECTED
Opiates: NOT DETECTED
Tetrahydrocannabinol: NOT DETECTED

## 2022-03-22 IMAGING — CR DG CHEST 2V
2 series · 2 of 2 positions shown · non-contrast
Comparison: [DATE]

CLINICAL DATA: Fatigue.  Uncontrolled hypertension.

EXAM:
CHEST - 2 VIEW

[chest pa]
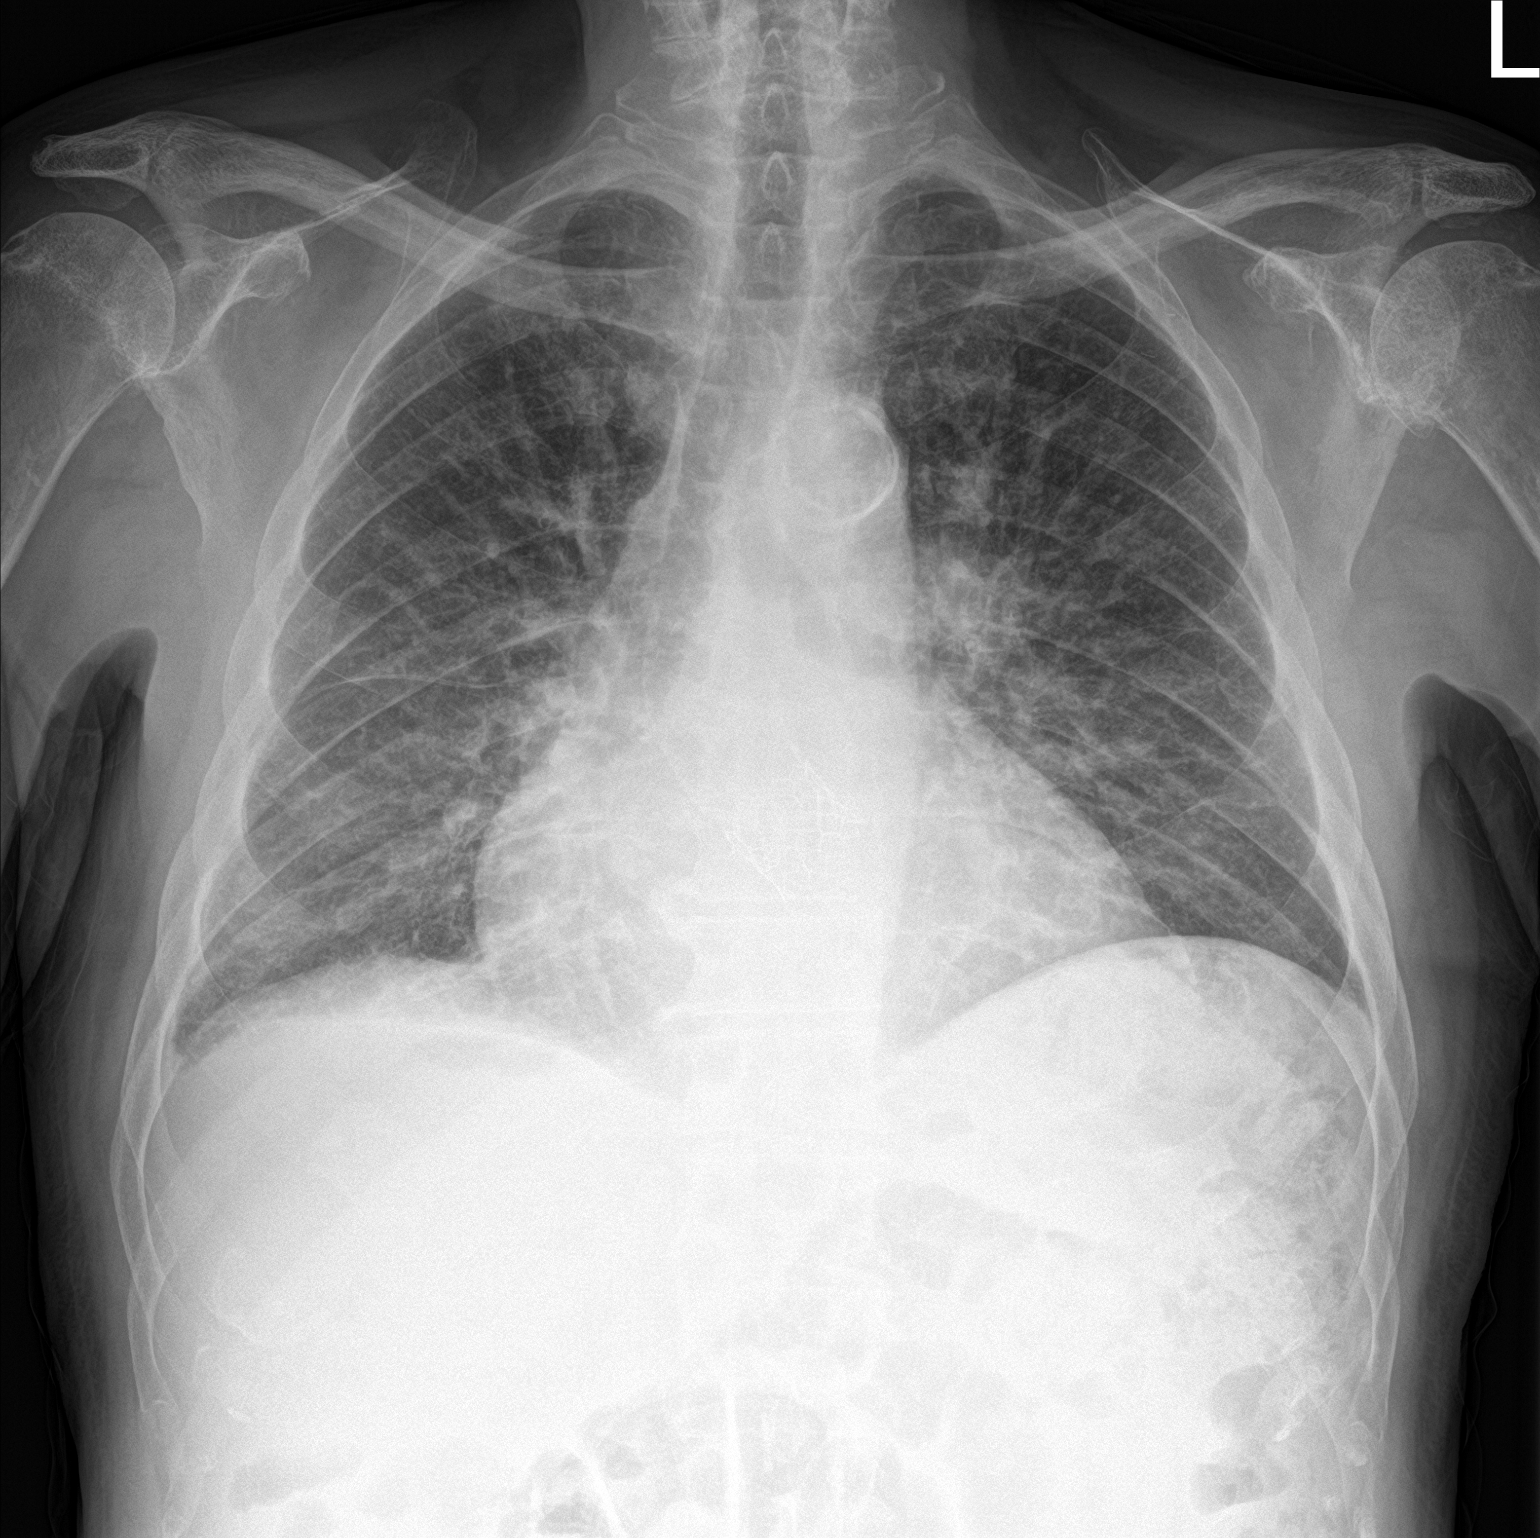

[chest lat]
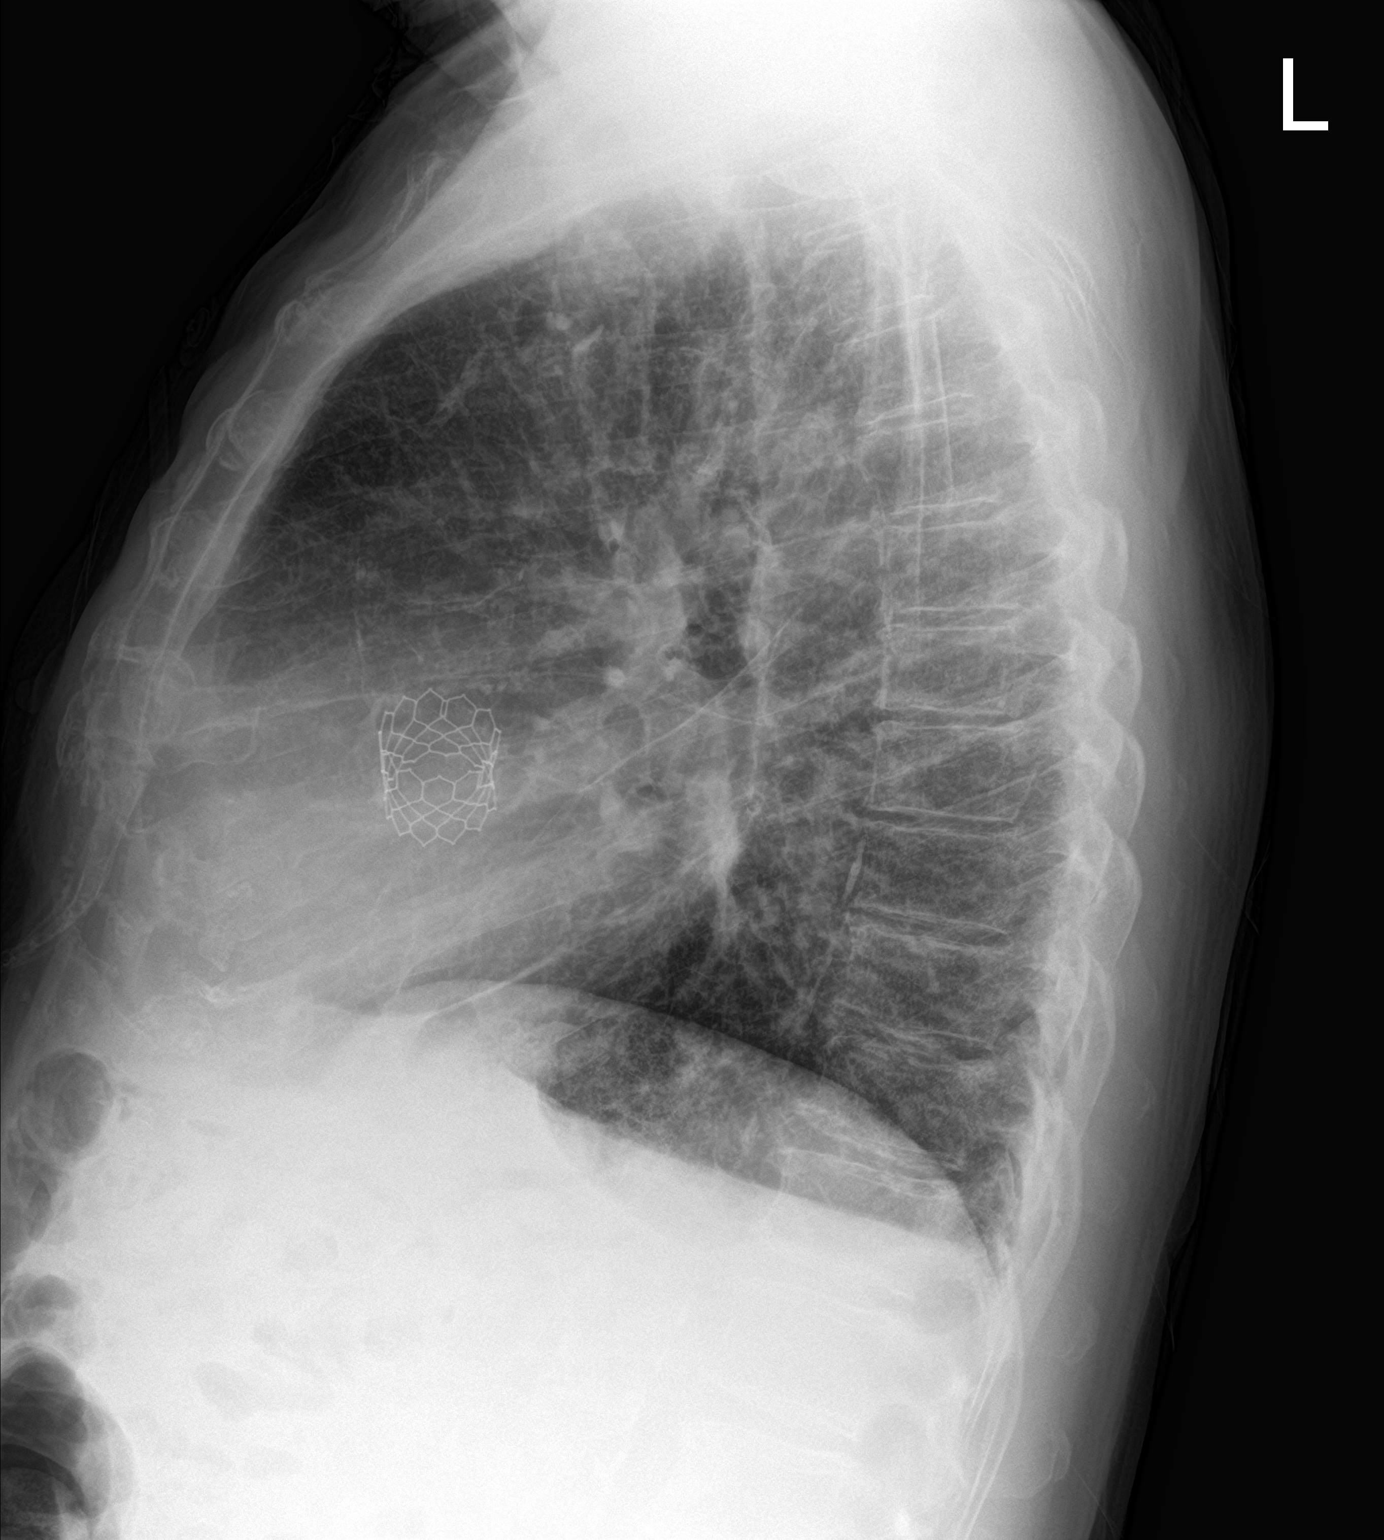

[2 of 2 positions shown; findings below may reference images not displayed]

FINDINGS: [RZ] hours. Low volume film. The cardio pericardial silhouette is
enlarged. Status post TAVR. Basilar atelectasis with possible trace
right pleural effusion. Vascular congestion evident without overt
airspace pulmonary edema. Bones are diffusely demineralized.
IMPRESSION: Low volume film with basilar atelectasis and possible trace right
pleural effusion.

## 2022-03-22 MED ORDER — CLOPIDOGREL BISULFATE 75 MG PO TABS
75.0000 mg | ORAL_TABLET | Freq: Every day | ORAL | Status: DC
  Administered 2022-03-22 – 2022-03-26 (×5): 75 mg via ORAL
  Filled 2022-03-22 (×5): qty 1

## 2022-03-22 MED ORDER — ATORVASTATIN CALCIUM 10 MG PO TABS
10.0000 mg | ORAL_TABLET | Freq: Every day | ORAL | Status: DC
  Administered 2022-03-22 – 2022-03-26 (×5): 10 mg via ORAL
  Filled 2022-03-22 (×5): qty 1

## 2022-03-22 MED ORDER — ISOSORBIDE DINITRATE 30 MG PO TABS
30.0000 mg | ORAL_TABLET | Freq: Three times a day (TID) | ORAL | Status: DC
  Administered 2022-03-22 – 2022-03-24 (×6): 30 mg via ORAL
  Filled 2022-03-22 (×10): qty 1

## 2022-03-22 MED ORDER — FUROSEMIDE 10 MG/ML IJ SOLN
40.0000 mg | Freq: Once | INTRAMUSCULAR | Status: AC
  Administered 2022-03-22: 40 mg via INTRAVENOUS
  Filled 2022-03-22: qty 4

## 2022-03-22 MED ORDER — CLONIDINE HCL 0.1 MG/24HR TD PTWK
0.1000 mg | MEDICATED_PATCH | TRANSDERMAL | Status: DC
  Administered 2022-03-22: 0.1 mg via TRANSDERMAL
  Filled 2022-03-22: qty 1

## 2022-03-22 MED ORDER — NITROGLYCERIN IN D5W 200-5 MCG/ML-% IV SOLN
0.0000 ug/min | INTRAVENOUS | Status: DC
  Administered 2022-03-22: 5 ug/min via INTRAVENOUS
  Administered 2022-03-23 (×2): 95 ug/min via INTRAVENOUS
  Administered 2022-03-24: 45 ug/min via INTRAVENOUS
  Administered 2022-03-24: 90 ug/min via INTRAVENOUS
  Filled 2022-03-22 (×6): qty 250

## 2022-03-22 MED ORDER — PANTOPRAZOLE SODIUM 40 MG PO TBEC
40.0000 mg | DELAYED_RELEASE_TABLET | Freq: Every day | ORAL | Status: DC
  Administered 2022-03-23 – 2022-03-26 (×4): 40 mg via ORAL
  Filled 2022-03-22 (×4): qty 1

## 2022-03-22 MED ORDER — SODIUM CHLORIDE 0.9 % IV BOLUS
500.0000 mL | Freq: Once | INTRAVENOUS | Status: AC
  Administered 2022-03-22: 500 mL via INTRAVENOUS

## 2022-03-22 MED ORDER — APIXABAN 5 MG PO TABS
5.0000 mg | ORAL_TABLET | Freq: Two times a day (BID) | ORAL | Status: DC
  Administered 2022-03-22 – 2022-03-26 (×8): 5 mg via ORAL
  Filled 2022-03-22 (×8): qty 1

## 2022-03-22 NOTE — Consult Note (Signed)
CARDIOLOGY CONSULT NOTE  ?Patient ID: ?Nicholas Shelton ?MRN: [id number] ?DOB/AGE: [birth year] 81 y.o. ? ?Admit date: 03/22/2022 ?Attending physician: Carmin Muskrat, MD ?Primary Physician:  Christain Sacramento, MD ?Outpatient Cardiologist: Rex Kras, DO, [facility] ?Inpatient Cardiologist: Rex Kras, DO, [facility] ? ?Reason of consultation: Hypertensive crisis  ?Referring physician: Carmin Muskrat, MD ? ?Chief complaint: elevated blood pressure  ? ?HPI:  ?Nicholas Shelton is a 81 y.o. 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 ? ?Patient is being followed by the practice for aortic stenosis and on subsequent echocardiogram was noted to have change in LVEF.  He underwent TAVR work-up and successfully underwent 29 mm Edwards SAPIEN 3 valve implantation in February 2023.  Thereafter went for routine eye exam was noted to have retinal artery branch occlusion was sent to ED for further evaluation.  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  He was started on dual antiplatelet therapy and high intensity statin therapy. ? ?We presented to the office in April 2023 post hospitalization he was noted to be in atrial fibrillation.  He was started on carvedilol and amiodarone as well as Eliquis for thromboembolic prophylaxis.  He presented to the office as an urgent visit as he was feeling more fatigued and principal care management data noted his heart rate to be in the 40s. ? ?During his last office visit 2 days ago he was noted to have complete heart block and therefore was  recommended to stop amiodarone as well as carvedilol. ? ?He now presents to the hospital for high blood pressure with systolics in the 2268mmHg range. ? ?He denies anginal discomfort or heart failure symptoms. ? ?Patient's brother provides his home medication list.  ?Hydralazine '100mg'$  po tid ?Entresto 97/103 po bid ?Isosorbide dinitrate '30mg'$  po bid  ?Clonidine 0.1 qam ?Lasix 20 po qam ?Eliquis '5mg'$  bid  ?Aldactone 12.'5mg'$  po qam  ? ?ALLERGIES: ?Allergies  ?Allergen Reactions  ? Trazodone And Nefazodone Other (See Comments)  ?  Sluggishness the next morning  ? ? ?PAST MEDICAL HISTORY: ?Past Medical History:  ?Diagnosis Date  ? Actinic keratosis   ? Arthritis   ? AV block   ? Bradycardia   ? Degeneration of lumbar or lumbosacral intervertebral disc   ? Deviated nasal septum   ? [place]   ? Gastroesophageal reflux disease without esophagitis   ? Generalized anxiety disorder   ? Gout with tophus   ? Hearing loss   ? Hyperlipidemia   ? Hypertension   ? Insomnia   ? Murmur   ? Aortic stenosis  ? Nasal turbinate hypertrophy   ? Osteoarthritis of wrist   ? PAC (premature atrial contraction)   ? Peptic ulcer   ? Peripheral neuropathy   ? PVC (premature ventricular contraction)   ? 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 with 273mS3AUR via [place] approach with Dr.Cooper and Dr. BaCyndia Bent? Seasonal allergic rhinitis due to pollen   ? ? ?PAST SURGICAL HISTORY: ?Past Surgical History:  ?Procedure Laterality Date  ? COLONOSCOPY    ? ELBOW SURGERY    ? pinched nerve on both arms  ?  INGUINAL HERNIA REPAIR Right 10/30/2018  ? Procedure: OPEN REPAIR OF INCARCERATED RIGHT INGUINAL HERNIA WITH MESH;  Surgeon: Greer Pickerel, MD;  Location: [place];  Service: General;  Laterality: Right;  ? LEFT HEART CATH AND CORONARY ANGIOGRAPHY N/A 01/15/2021  ? Procedure: LEFT HEART CATH AND CORONARY ANGIOGRAPHY;  Surgeon: Adrian Prows, MD;  Location: [place] [facility];  Service: Cardiovascular;   Laterality: N/A;  ? TEE WITHOUT CARDIOVERSION N/A 12/24/2021  ? Procedure: TRANSESOPHAGEAL ECHOCARDIOGRAM (TEE);  Surgeon: Sherren Mocha, MD;  Location: [place];  Service: Open Heart Surgery;  Laterality: N/A;  ? 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  ? Procedure: TOTAL KNEE ARTHROPLASTY;  Surgeon: Sydnee Cabal, MD;  [facility];  Service: Orthopedics;  Laterality: Right;  with adductor canal  ? WRIST SURGERY    ? pinched nerve  ? ? ?FAMILY HISTORY: ?The patient's family history includes Heart attack in his mother. ?  ?SOCIAL HISTORY:  ?The patient  reports that he quit smoking about 27 years ago. His smoking use included cigarettes. He has a 7.50 pack-year smoking history. He has never used smokeless tobacco. He reports that he does not drink alcohol and does not use drugs. ? ?MEDICATIONS: ?Current Outpatient Medications  ?Medication Instructions  ? acetaminophen (TYLENOL) 500 mg, Oral, Every 8 hours PRN  ? apixaban (ELIQUIS) 5 mg, Oral, 2 times daily  ? aspirin EC 81 mg, Oral, Daily, Swallow whole.  ? atorvastatin (LIPITOR) 10 mg, Oral, Daily  ? cloNIDine (CATAPRES) 0.2 mg, Oral, 2 times daily  ? furosemide (LASIX) 20 mg, Oral  ? hydrALAZINE (APRESOLINE) 100 mg, Oral, 3 times daily  ? isosorbide dinitrate (ISORDIL) 30 mg, Oral, 3 times daily  ? metFORMIN (GLUCOPHAGE) 1,000 mg, Oral, 2 times daily with meals  ? Multiple Vitamin (MULTI VITAMIN MENS PO) Oral  ? pantoprazole (PROTONIX) 40 mg, Oral, Daily before breakfast  ? Psyllium 28.3 % POWD Oral  ? sacubitril-valsartan (ENTRESTO) 97-103 MG 1 tablet, Oral, 2 times daily  ? spironolactone (ALDACTONE) 25 mg, Oral, Every morning  ? ? ?REVIEW OF SYSTEMS: ?Review of Systems  ?Constitutional: Negative for malaise/fatigue.  ?Cardiovascular:  Negative for chest pain, dyspnea on exertion, leg swelling, near-syncope, orthopnea, palpitations, paroxysmal nocturnal dyspnea and syncope.  ?Respiratory:  Negative for cough and shortness of breath.    ?Hematologic/Lymphatic: Negative for bleeding problem.  ?All other systems reviewed and are negative. ? ?PHYSICAL EXAM: ? ?  03/22/2022  ? 11:21 AM 03/22/2022  ? 10:24 AM 03/20/2022  ?  2:38 PM  ?Vitals with BMI  ?Height '5\' 11"'$     ?Weight 181 lbs    ?BMI 25.26    ?Systolic  983 382  ?Diastolic  77 80  ?Pulse  65 58  ? ? ?No intake or output data in the 24 hours ending 03/22/22 1412  ?Net IO Since Admission: No IO data has been entered for this period [03/22/22 1412] ? ?General: Well-developed, hard of hearing, hemodynamically stable, no acute distress. ?HEENT: Normocephalic, atraumatic, no scleral icterus or xanthelasmas, no JVP, bilateral carotid bruits. ?Heart: Regular rate and rhythm, positive S1-S2, no murmurs rubs or gallops appreciated. ?Abdomen: Soft, nontender, nondistended, positive bowel sounds in all 4 quadrants, no ascites. ?Extremities: No peripheral edema, +2 DP and PT pulses. ?Neuro: Alert oriented x4, nonfocal, normal muscle tone. ?Psych: Normal mood and affect, normal behavior, cooperative ? ?RADIOLOGY: ?DG Chest 2 View ? ?Result Date: 03/22/2022 ?CLINICAL DATA:  Fatigue.  Uncontrolled hypertension. EXAM: CHEST - 2 VIEW COMPARISON:  03/03/2022 FINDINGS: 1340 hours.  Low volume film. The cardio pericardial silhouette is enlarged. Status post TAVR. Basilar atelectasis with possible trace right pleural effusion. Vascular congestion evident without overt airspace pulmonary edema. Bones are diffusely demineralized. IMPRESSION: Low volume film with basilar atelectasis and possible trace right pleural effusion. Electronically Signed   By: Misty Stanley M.D.   On: 03/22/2022 14:06   ? ?LABORATORY DATA: ?Lab Results  ?Component Value Date  ? WBC 6.5 03/22/2022  ? HGB 11.1 (L) 03/22/2022  ? HCT 32.8 (L) 03/22/2022  ? MCV 87.5 03/22/2022  ? PLT 126 (L) 03/22/2022  ?  ?Recent Labs  ?Lab 03/22/22 ?1200  ?NA 133*  ?K 4.4  ?CL 101  ?CO2 23  ?BUN 28*  ?CREATININE 1.36*  ?CALCIUM 9.4  ?PROT 6.6  ?BILITOT 0.9  ?ALKPHOS  82  ?ALT 30  ?AST 21  ?GLUCOSE 108*  ? ? ?Lipid Panel  ?Lab Results  ?Component Value Date  ? CHOL 255 (H) 02/19/2022  ? HDL 62 02/19/2022  ? LDLCALC 165 (H) 02/19/2022  ? TRIG 139 02/19/2022  ? CHOLHDL 4.1 02/19/2022  ? ? ?BNP (las

## 2022-03-22 NOTE — ED Provider Triage Note (Signed)
Emergency Medicine Provider Triage Evaluation Note ? ?Nicholas Shelton , a 81 y.o. male  was evaluated in triage.  Pt complains of hypertension.  Patient states that he presented to his doctor 2 days ago to have continued uncontrolled hypertension.  States that this medication was changed at that time in an attempt to better control his blood pressure, he was also given instructions to routinely monitor his blood pressure at home and to go to the ER if his blood pressure was higher than 500 systolic.  He presents here today for same, stating that pressure at home was 370 systolic.  He denies any symptoms of hypertension including blurred vision, dizziness, chest pain, shortness of breath, nausea, or vomiting. ? ?Review of Systems  ?Positive:  ?Negative: See above ? ?Physical Exam  ?BP (!) 210/77   Pulse 65   Temp 98.5 ?F (36.9 ?C) (Oral)   Resp 18   Ht '5\' 11"'$  (1.803 m)   Wt 82.1 kg   SpO2 98%   BMI 25.24 kg/m?  ?Gen:   Awake, no distress   ?Resp:  Normal effort  ?MSK:   Moves extremities without difficulty  ?Other:   ? ?Medical Decision Making  ?Medically screening exam initiated at 11:54 AM.  Appropriate orders placed.  Nicholas Shelton was informed that the remainder of the evaluation will be completed by another provider, this initial triage assessment does not replace that evaluation, and the importance of remaining in the ED until their evaluation is complete. ? ? ?  ?Bud Face, PA-C ?03/22/22 1157 ? ?

## 2022-03-22 NOTE — ED Provider Notes (Signed)
?[facility] ?Provider Note ? ? ?CSN: [id number] ?Arrival date & time: 03/22/22  9937 ? ?  ? ?History ? ?Chief Complaint  ?Patient presents with  ? Hypertension  ? ? ?Nicholas Shelton is a 81 y.o. male. ? ?HPI ?Presents companied by his sister-in-law, son, who assists with the history.  Additional details obtained on chart review from cardiology visit earlier this week.  He presents without physical complaints, without dyspnea, but with concern for systolic blood pressure reading greater than 200.  He notes that he was seen, evaluated at his cardiology office earlier this week.  There is change in his medication, and he states that he was informed of need to return here for systolic greater than 169.  He has been taking his new medication regimen as directed.  On chart review is clear the patient on cessation of amiodarone 2 days ago. ?  ? ?Home Medications ?Prior to Admission medications   ?Medication Sig Start Date End Date Taking? Authorizing Provider  ?apixaban (ELIQUIS) 5 MG TABS tablet Take 1 tablet (5 mg total) by mouth 2 (two) times daily. 03/03/22 06/01/22 Yes Tolia, Sunit, DO  ?aspirin EC 81 MG tablet Take 81 mg by mouth daily. Swallow whole.   Yes [provider]  ?atorvastatin (LIPITOR) 10 MG tablet Take 10 mg by mouth daily. 02/25/22  Yes [provider]  ?cloNIDine (CATAPRES) 0.2 MG tablet Take 1 tablet (0.2 mg total) by mouth 2 (two) times daily. ?Patient taking differently: Take 0.1 mg by mouth daily. Take 1/2 tablet daily 01/29/22 04/29/22 Yes Tolia, Sunit, DO  ?furosemide (LASIX) 20 MG tablet Take 20 mg by mouth.   Yes [provider]  ?hydrALAZINE (APRESOLINE) 100 MG tablet Take 1 tablet (100 mg total) by mouth 3 (three) times daily. 02/17/22  Yes Tolia, Sunit, DO  ?isosorbide dinitrate (ISORDIL) 30 MG tablet Take 1 tablet (30 mg total) by mouth 3 (three) times daily. ?Patient taking differently: Take 30 mg by mouth 2 (two) times daily. 01/31/22   Yes Tolia, Sunit, DO  ?metFORMIN (GLUCOPHAGE) 1000 MG tablet Take 1 tablet (1,000 mg total) by mouth 2 (two) times daily with a meal. 01/17/21  Yes Adrian Prows, MD  ?Multiple Vitamin (MULTI VITAMIN MENS PO) Take by mouth.   Yes [provider]  ?pantoprazole (PROTONIX) 40 MG tablet Take 40 mg by mouth daily before breakfast.   Yes [provider]  ?Psyllium 28.3 % POWD Take by mouth.   Yes [provider]  ?sacubitril-valsartan (ENTRESTO) 97-103 MG Take 1 tablet by mouth 2 (two) times daily. ?Patient taking differently: Take 1 tablet by mouth in the morning and at bedtime. 12/23/21 03/23/22 Yes Tolia, Sunit, DO  ?acetaminophen (TYLENOL) 500 MG tablet Take 500 mg by mouth every 8 (eight) hours as needed for moderate pain.    [provider]  ?spironolactone (ALDACTONE) 25 MG tablet Take 1 tablet (25 mg total) by mouth every morning. ?Patient taking differently: Take 12.5 mg by mouth every morning. 03/03/22 06/01/22  Rex Kras, DO  ?   ? ?Allergies    ?Trazodone and nefazodone   ? ?Review of Systems   ?Review of Systems  ?Constitutional:   ?     Per HPI, otherwise negative  ?HENT:    ?     Per HPI, otherwise negative  ?Respiratory:    ?     Per HPI, otherwise negative  ?Cardiovascular:   ?     Per HPI, otherwise negative  ?  Gastrointestinal:  Negative for vomiting.  ?Endocrine:  ?     Negative aside from HPI  ?Genitourinary:   ?     Neg aside from HPI   ?Musculoskeletal:   ?     Per HPI, otherwise negative  ?Skin: Negative.   ?Neurological:  Positive for weakness. Negative for syncope.  ? ?Physical Exam ?Updated Vital Signs ?BP (!) 210/77   Pulse 65   Temp 98.5 ?F (36.9 ?C) (Oral)   Resp 18   Ht '5\' 11"'$  (1.803 m)   Wt 82.1 kg   SpO2 98%   BMI 25.24 kg/m?  ?Physical Exam ?Vitals and nursing note reviewed.  ?Constitutional:   ?   General: He is not in acute distress. ?   Appearance: He is well-developed.  ?HENT:  ?   Head: Normocephalic and atraumatic.  ?Eyes:  ?    Conjunctiva/sclera: Conjunctivae normal.  ?Cardiovascular:  ?   Rate and Rhythm: Normal rate and regular rhythm.  ?Pulmonary:  ?   Effort: Pulmonary effort is normal. No respiratory distress.  ?   Breath sounds: No stridor.  ?Abdominal:  ?   General: There is no distension.  ?Skin: ?   General: Skin is warm and dry.  ?Neurological:  ?   Mental Status: He is alert and oriented to person, place, and time.  ?   Motor: Atrophy present.  ? ? ?ED Results / Procedures / Treatments   ?Labs ?(all labs ordered are listed, but only abnormal results are displayed) ?Labs Reviewed  ?CBC - Abnormal; Notable for the following components:  ?    Result Value  ? RBC 3.75 (*)   ? Hemoglobin 11.1 (*)   ? HCT 32.8 (*)   ? Platelets 126 (*)   ? All other components within normal limits  ?COMPREHENSIVE METABOLIC PANEL - Abnormal; Notable for the following components:  ? Sodium 133 (*)   ? Glucose, Bld 108 (*)   ? BUN 28 (*)   ? Creatinine, Ser 1.36 (*)   ? GFR, Estimated 52 (*)   ? All other components within normal limits  ?BRAIN NATRIURETIC PEPTIDE - Abnormal; Notable for the following components:  ? B Natriuretic Peptide 4,452.1 (*)   ? All other components within normal limits  ? ? ?EKG ?EKG Interpretation ? ?Date/Time:  Saturday Mar 22 2022 10:31:30 EDT ?Ventricular Rate:  65 ?PR Interval:  234 ?QRS Duration: 128 ?QT Interval:  394 ?QTC Calculation: 409 ?R Axis:   112 ?Text Interpretation: Sinus rhythm with 1st degree A-V block Non-specific intra-ventricular conduction block Nonspecific T wave abnormality Abnormal ECG When compared with ECG of 25-Dec-2021 10:19, PREVIOUS ECG IS PRESENT Confirmed by Carmin Muskrat [phone]) on 03/22/2022 12:16:22 PM ? ?In comparison to the ECG from cardiology clinic 2 days ago, there is no longer heart block evidence. ? ? ?Radiology ?DG Chest 2 View ? ?Result Date: 03/22/2022 ?CLINICAL DATA:  Fatigue.  Uncontrolled hypertension. EXAM: CHEST - 2 VIEW COMPARISON:  03/03/2022 FINDINGS: 1340 hours. Low volume  film. The cardio pericardial silhouette is enlarged. Status post TAVR. Basilar atelectasis with possible trace right pleural effusion. Vascular congestion evident without overt airspace pulmonary edema. Bones are diffusely demineralized. IMPRESSION: Low volume film with basilar atelectasis and possible trace right pleural effusion. Electronically Signed   By: Misty Stanley M.D.   On: 03/22/2022 14:06   ? ?Procedures ?Procedures  ? ? ?Medications Ordered in ED ?Medications  ?nitroGLYCERIN 50 mg in dextrose 5 % 250 mL (0.2 mg/mL) infusion (has  no administration in time range)  ?furosemide (LASIX) injection 40 mg (has no administration in time range)  ?sodium chloride 0.9 % bolus 500 mL (500 mLs Intravenous New Bag/Given 03/22/22 1330)  ? ? ?ED Course/ Medical Decision Making/ A&P ?This patient with a Hx of medical issues including aortic stenosis, recently diagnosed cerebral aneurysm, hypertension, heart failure presents to the ED for concern of hypertensive emergency, this involves an extensive number of treatment options, and is a complaint that carries with it a high risk of complications and morbidity.   ? ?The differential diagnosis includes endorgan damage, CHF exacerbation, less likely aneurysm rupture, though this is a known entity ? ? ?Social Determinants of Health: ? ?Age, poor hearing ? ?Additional history obtained: ? ?Additional history and/or information obtained from family at bedside and chart review, notable for family for HPI, recent visit, chart review notable for cardiology visit 2 days ago with cessation of amiodarone, carvedilol.  Within the past month patient is also been diagnosed with his aneurysm, has had attempts to control his blood pressure, heart failure, and is seeing interventional radiology for planned intervention.  Neck step is cardiology clearance. ? ? ?After the initial evaluation, orders, including: Labs x-ray were initiated. ? ? ?Patient placed on Cardiac and Pulse-Oximetry  Monitors. ?The patient was maintained on a cardiac monitor.  The cardiac monitored showed an rhythm of 70 sinus normal ?The patient was also maintained on pulse oximetry. The readings were typically 100% room air normal ? ? ?On re

## 2022-03-22 NOTE — ED Triage Notes (Signed)
Pt arrived POV from home c/o hypertension. Pt was seen on Thursday and had his medications changed but was told if his BP got over 200 to come to the ED. Pt denies any pain or other complaints.  ?

## 2022-03-22 NOTE — H&P (Addendum)
 Family Medicine Teaching [facility] Admission History and Physical Service Pager: [phone]  Patient name: Nicholas Shelton Medical record number: [id number] Date of birth: 1941-09-05 Age: 81 y.o. Gender: male  Primary Care Provider: Tanda Prentice DEL, MD Consultants: Cardiology Code Status: Full  Preferred Emergency Contact: Arn Mcomber (wife) [phone]  Chief Complaint: Elevated blood pressure  Assessment and Plan: Nicholas Shelton is a 81 y.o. male presenting with concern for elevated blood pressure readings at home . 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  Asx- no HA, chest pain or discomfort, SOB, visual changes.  Needs titration of medicine to better control BP given his comorbidities Unclear etiology- no sx's of MI or CVA or medication non-compliance Cardiology consulted and started Nitro gtt. No head trauma.  Plan for further workup in the AM per nephro, as seen below - Admit to Progressive, attending Dr. Jeanelle - Cardiology following - Goal SBP - Labs: AM cortisol, CBC, BMP, 24 hour urine metanephrines, aldosterone + renin activity w/ ratio - BP regimen, per cardiology reccs: Nitro gtt Increase isordil  to 30mg  TID (prescribed TID, but pt takes BID) Clonidine  transdermal patch - Monitor BP closely - NPO  - Neurochecks  - PT/OT eval and treat  HFrEF (LVEF 35-40%, G2DD)  Aortic stenosis s/p TAVR w/ bioprosthetic valve Appears euvolemic on exam. BNP elevated to 4, 452- persistently in the 1000s in the past, nearly doubled from a few months ago. S/p IV lasix  40 mg x1. Home regimen: Lasix  20mg  daily -  Re-dose Lasix  tomorrow 5/14 - Hold home Lasix  - Monitor UOP - Strict Intake/Output - Daily weights  Left ICA anueyrsm- 4mm saccular; incidental finding Followed o/p by IR. MRI and MRA brain on admission for admission w/ BRAO found incidental ICA aneurysm. 2-3% 5-year rupture risk, 7% lifetime rupture risk. Patient prefers endovascular repair as opposed to monitoring. Awaiting discussion w/ cardiology regarding risk stratification undergoing general anesthesia for procedure - Cardiology following- can possibly discuss risk stratification while here - Neuro checks  Paroxysmal fibrillation, hx complete heart block NSR on exam. CHADS2VASC score 7. On Eliquis  5mg  at home. Had complete heart block on 5/11 at office visit- amiodarone  and carvedilol  d/c'ed at that time. - Continue eliquis  - Cardiac monitoring  HLD Last LDL 165, goal <70. Takes atorvostatin 10 mg daily- pt declined increase in dosage - Continue atorvostatin - Lipid panel in AM  Chronic bilateral infarcts; left retinal artery occlusion Completed 3 weeks DAPT, then supposed to continue clopidogrel  alone indefinitely, per neurology. Appears pt is only taking ASA.  - Start Clopidogrel  75 mg daily  Non-insulin  dependent T2DM A1c 6.2%. Takes Metformin  100mg  BID at home. - CBG monitoring daily w/ labs - Hold home Metformin   GERD - Continue home Protonix  40mg  daily  Chronic normocytic anemia, likely 2/2 chronic disease Hgb 11.1 on admission. Asx, stable near baseline 10-11. - Monitor with labs  FEN/GI: Heart healthy Prophylaxis: Eliquis   Disposition: Progressive  History of Present Illness:  Nicholas Shelton is a 81 y.o. male presenting from home due to concern regarding elevated blood pressures.   Patient reports feeling well currently. Had no symptoms this morning but he took his blood pressure which was 221/97. He was  previously told to come to the ED for any BP over 220 systolic. He did take his blood pressure medications at  home. He manages all of his home medications.   He has not fallen. He denies any weakness. He walks well inside the house. He lives with his wife at home who is handicap. He is able to do all IADLs at home.   He is followed by IR and cardiology outpatient. They are working on possible surgical management of PCOM aneurysm.   Denies diarrhea, constipation, N/V. States that he takes medication for constipation. No swelling in legs. No difficulties or changes in urination. No dysuria.   Does have itchy feet from time to time, is not sure if this is from his diabetes.   ED Workup: BNP over 4000. CXR with trace pleural effusions. Received Lasix  40 mg x1. Consulted cardiology, started on nito gtt.   Review Of Systems: Per HPI   Patient Active Problem List   Diagnosis Date Noted   Retinal arterial branch occlusion 02/19/2022   Hypokalemia 12/25/2021   HTN (hypertension) 12/24/2021   HLD (hyperlipidemia) 12/24/2021   Type 2 diabetes mellitus with complication, without long-term current use of insulin  (HCC) 12/24/2021   Hx of tobacco use, presenting hazards to health 12/24/2021   Nonischemic cardiomyopathy (HCC) 12/24/2021   Severe aortic stenosis 12/24/2021   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 Gerome 01/14/2021   Incarcerated inguinal hernia 10/30/2018    Past Medical History: Past Medical History:  Diagnosis Date   Actinic keratosis    Arthritis    AV block    Bradycardia    Degeneration of lumbar or lumbosacral intervertebral disc    Deviated nasal septum    Diabetes mellitus without complication (HCC)    Gastroesophageal reflux disease without esophagitis    Generalized anxiety disorder     Gout with tophus    Hearing loss    Hyperlipidemia    Hypertension    Insomnia    Murmur    Aortic stenosis   Nasal turbinate hypertrophy    Osteoarthritis of wrist    PAC (premature atrial contraction)    Peptic ulcer    Peripheral neuropathy    PVC (premature ventricular contraction)    S/P TAVR (transcatheter aortic valve replacement) 12/24/2021   with 29mm S3AUR via [place] approach with Dr.Cooper and Dr. Lucas   Seasonal allergic rhinitis due to pollen     Past Surgical History: Past Surgical History:  Procedure Laterality Date   COLONOSCOPY     ELBOW SURGERY     pinched nerve on both arms   INGUINAL HERNIA REPAIR Right 10/30/2018   Procedure: OPEN REPAIR OF INCARCERATED RIGHT INGUINAL HERNIA WITH MESH;  Surgeon: Tanda Locus, MD;  Location: [facility] OR;  Service: General;  Laterality: Right;   LEFT HEART CATH AND CORONARY ANGIOGRAPHY N/A 01/15/2021   Procedure: LEFT HEART CATH AND CORONARY ANGIOGRAPHY;  Surgeon: Ladona Heinz, MD;  Location: [facility] INVASIVE CV LAB;  Service: Cardiovascular;  Laterality: N/A;   TEE WITHOUT CARDIOVERSION N/A 12/24/2021   Procedure: TRANSESOPHAGEAL ECHOCARDIOGRAM (TEE);  Surgeon: Wonda Sharper, MD;  Location: [facility] OR;  Service: Open Heart Surgery;  Laterality: N/A;   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   Procedure: TOTAL KNEE ARTHROPLASTY;  Surgeon:  Gerome Charleston, MD;  Location: [facility] ORS;  Service: Orthopedics;  Laterality: Right;  with adductor canal   WRIST SURGERY     pinched nerve    Social History: Social History   Tobacco Use   Smoking status: Former    Packs/day: 0.50    Years: 15.00    Pack years: 7.50    Types: Cigarettes    Quit date: 1996    Years since quitting: 27.3   Smokeless tobacco: Never  Vaping Use   Vaping Use: Never used  Substance Use Topics   Alcohol  use: No   Drug use: No    Family History: Family History  Problem Relation Age of Onset   Heart attack Mother     Allergies and Medications: Allergies  Allergen  Reactions   Trazodone And Nefazodone Other (See Comments)    Sluggishness the next morning   No current facility-administered medications on file prior to encounter.   Current Outpatient Medications on File Prior to Encounter  Medication Sig Dispense Refill   apixaban  (ELIQUIS ) 5 MG TABS tablet Take 1 tablet (5 mg total) by mouth 2 (two) times daily. 180 tablet 0   aspirin  EC 81 MG tablet Take 81 mg by mouth daily. Swallow whole.     atorvastatin  (LIPITOR) 10 MG tablet Take 10 mg by mouth daily.     cloNIDine  (CATAPRES ) 0.2 MG tablet Take 1 tablet (0.2 mg total) by mouth 2 (two) times daily. (Patient taking differently: Take 0.1 mg by mouth daily. Take 1/2 tablet daily) 180 tablet 0   furosemide  (LASIX ) 20 MG tablet Take 20 mg by mouth.     hydrALAZINE  (APRESOLINE ) 100 MG tablet Take 1 tablet (100 mg total) by mouth 3 (three) times daily. 270 tablet 2   isosorbide  dinitrate (ISORDIL ) 30 MG tablet Take 1 tablet (30 mg total) by mouth 3 (three) times daily. (Patient taking differently: Take 30 mg by mouth 2 (two) times daily.) 90 tablet 3   metFORMIN  (GLUCOPHAGE ) 1000 MG tablet Take 1 tablet (1,000 mg total) by mouth 2 (two) times daily with a meal.     Multiple Vitamin (MULTI VITAMIN MENS PO) Take by mouth.     pantoprazole  (PROTONIX ) 40 MG tablet Take 40 mg by mouth daily before breakfast.     Psyllium 28.3 % POWD Take by mouth.     sacubitril -valsartan  (ENTRESTO ) 97-103 MG Take 1 tablet by mouth 2 (two) times daily. (Patient taking differently: Take 1 tablet by mouth in the morning and at bedtime.) 180 tablet 3   acetaminophen  (TYLENOL ) 500 MG tablet Take 500 mg by mouth every 8 (eight) hours as needed for moderate pain.     spironolactone  (ALDACTONE ) 25 MG tablet Take 1 tablet (25 mg total) by mouth every morning. (Patient taking differently: Take 12.5 mg by mouth every morning.) 90 tablet 0    Objective: BP (!) 210/77   Pulse 65   Temp 98.5 F (36.9 C) (Oral)   Resp 18   Ht 5' 11  (1.803 m)   Wt 82.1 kg   SpO2 98%   BMI 25.24 kg/m  Exam: General: Well-appearing, comfortable, in no distress, awake and alert elderly gentleman Eyes: Pupils PERRLA, EOMI. ENTM: MMM. Good dentition Neck: Normal ROM Cardiovascular: 2/6 systolic ejection murmur. Normal rhythm. HR in 70s. Respiratory: Clear in all fields. Normal WOB on room air. No wheezing or crackles. Gastrointestinal: Soft, NTND, BS present MSK: No joint deformities Derm: No rashes or lesions. Tattoos on left arm. Neuro: A&O  x4. Speech clear and fluent. Normal strength and sensation in BL upper and lower extremities.  Psych: Very pleasant. Normal mood and affect.  Labs and Imaging: CBC BMET  Recent Labs  Lab 03/22/22 1200  WBC 6.5  HGB 11.1*  HCT 32.8*  PLT 126*   Recent Labs  Lab 03/22/22 1200  NA 133*  K 4.4  CL 101  CO2 23  BUN 28*  CREATININE 1.36*  GLUCOSE 108*  CALCIUM  9.4     EKG: NSR, no ST or T wave abnormalities compared to prior  Dartha Geralds, DO 03/22/2022, 2:21 PM PGY-1, [facility] Family Medicine FPTS Intern pager: [phone], text pages welcome

## 2022-03-22 NOTE — Progress Notes (Signed)
FPTS Brief Progress Note ? ?S:Patient reports that his lower back has some pain that is related to the bed and his position in the bed. He overall doesn't have many other complaints. He is wanting to try and sleep more tonight as he only had a few hours of sleep the night before. He reports that he is urinating well. ? ? ?O: ?BP (!) 186/60   Pulse 66   Temp 98.5 ?F (36.9 ?C) (Oral)   Resp 17   Ht '5\' 11"'$  (1.803 m)   Wt 82.1 kg   SpO2 96%   BMI 25.24 kg/m?   ?General: elderly male, NAD, sitting partially up in bed leaning more on his right side ?CV: RRR, sinus rhythm on telemetry ?Pulm: lungs clear to auscultation bilaterally, breathing comfortably on room air, speaking in full sentences ?GI: soft, non-distended, BS present ?MSK: lower back without any discoloration, only TTP in the SI joints bilaterally ? ?A/P: ?Severe asymptomatic hypertension ?- Cardiology following ?- Goal systolic BP is 323, if persistently elevated >220 or symptomatic, will discuss the case further with CCM. ?- Currently on nitro drip, isordil '20mg'$  TID, clonidine patch ?- Morning labs already ordered ? ?HFrEF ?Output has not yet been recorded. Patient is s/p 1 dose of '40mg'$  IV Lasix.  ?- Strict Is&Os ?- Daily weights ?- Will closely monitor to give appropriate dose of Lasix on 5/14 ? ? ?Rise Patience, DO ?03/22/2022, 10:39 PM ?PGY-2, [facility] Resident  ?Please page [phone] with questions.  ? ? ?

## 2022-03-23 ENCOUNTER — Observation Stay (HOSPITAL_BASED_OUTPATIENT_CLINIC_OR_DEPARTMENT_OTHER): Payer: Medicare Other

## 2022-03-23 DIAGNOSIS — D649 Anemia, unspecified: Secondary | ICD-10-CM | POA: Diagnosis present

## 2022-03-23 DIAGNOSIS — I160 Hypertensive urgency: Secondary | ICD-10-CM | POA: Diagnosis not present

## 2022-03-23 DIAGNOSIS — F411 Generalized anxiety disorder: Secondary | ICD-10-CM | POA: Diagnosis present

## 2022-03-23 DIAGNOSIS — K551 Chronic vascular disorders of intestine: Secondary | ICD-10-CM | POA: Diagnosis present

## 2022-03-23 DIAGNOSIS — I130 Hypertensive heart and chronic kidney disease with heart failure and stage 1 through stage 4 chronic kidney disease, or unspecified chronic kidney disease: Secondary | ICD-10-CM | POA: Diagnosis present

## 2022-03-23 DIAGNOSIS — I442 Atrioventricular block, complete: Secondary | ICD-10-CM | POA: Diagnosis present

## 2022-03-23 DIAGNOSIS — I10 Essential (primary) hypertension: Secondary | ICD-10-CM

## 2022-03-23 DIAGNOSIS — I161 Hypertensive emergency: Secondary | ICD-10-CM | POA: Diagnosis present

## 2022-03-23 DIAGNOSIS — K219 Gastro-esophageal reflux disease without esophagitis: Secondary | ICD-10-CM | POA: Diagnosis present

## 2022-03-23 DIAGNOSIS — I5043 Acute on chronic combined systolic (congestive) and diastolic (congestive) heart failure: Secondary | ICD-10-CM | POA: Diagnosis present

## 2022-03-23 DIAGNOSIS — E7800 Pure hypercholesterolemia, unspecified: Secondary | ICD-10-CM | POA: Diagnosis present

## 2022-03-23 DIAGNOSIS — I428 Other cardiomyopathies: Secondary | ICD-10-CM | POA: Diagnosis present

## 2022-03-23 DIAGNOSIS — H9190 Unspecified hearing loss, unspecified ear: Secondary | ICD-10-CM | POA: Diagnosis present

## 2022-03-23 DIAGNOSIS — J301 Allergic rhinitis due to pollen: Secondary | ICD-10-CM | POA: Diagnosis present

## 2022-03-23 DIAGNOSIS — N183 Chronic kidney disease, stage 3 unspecified: Secondary | ICD-10-CM | POA: Diagnosis present

## 2022-03-23 DIAGNOSIS — D696 Thrombocytopenia, unspecified: Secondary | ICD-10-CM | POA: Diagnosis present

## 2022-03-23 DIAGNOSIS — E1141 Type 2 diabetes mellitus with diabetic mononeuropathy: Secondary | ICD-10-CM | POA: Diagnosis present

## 2022-03-23 DIAGNOSIS — E871 Hypo-osmolality and hyponatremia: Secondary | ICD-10-CM | POA: Diagnosis present

## 2022-03-23 DIAGNOSIS — G4700 Insomnia, unspecified: Secondary | ICD-10-CM | POA: Diagnosis present

## 2022-03-23 DIAGNOSIS — M1A9XX1 Chronic gout, unspecified, with tophus (tophi): Secondary | ICD-10-CM | POA: Diagnosis present

## 2022-03-23 DIAGNOSIS — I480 Paroxysmal atrial fibrillation: Secondary | ICD-10-CM | POA: Diagnosis present

## 2022-03-23 DIAGNOSIS — I671 Cerebral aneurysm, nonruptured: Secondary | ICD-10-CM | POA: Diagnosis present

## 2022-03-23 DIAGNOSIS — M5136 Other intervertebral disc degeneration, lumbar region: Secondary | ICD-10-CM | POA: Diagnosis present

## 2022-03-23 DIAGNOSIS — Z96653 Presence of artificial knee joint, bilateral: Secondary | ICD-10-CM | POA: Diagnosis present

## 2022-03-23 DIAGNOSIS — E1165 Type 2 diabetes mellitus with hyperglycemia: Secondary | ICD-10-CM | POA: Diagnosis present

## 2022-03-23 DIAGNOSIS — N179 Acute kidney failure, unspecified: Secondary | ICD-10-CM | POA: Diagnosis present

## 2022-03-23 LAB — CBC
HCT: 29 % — ABNORMAL LOW (ref 39.0–52.0)
Hemoglobin: 10 g/dL — ABNORMAL LOW (ref 13.0–17.0)
MCH: 29.7 pg (ref 26.0–34.0)
MCHC: 34.5 g/dL (ref 30.0–36.0)
MCV: 86.1 fL (ref 80.0–100.0)
Platelets: 108 10*3/uL — ABNORMAL LOW (ref 150–400)
RBC: 3.37 MIL/uL — ABNORMAL LOW (ref 4.22–5.81)
RDW: 13.2 % (ref 11.5–15.5)
WBC: 5.8 10*3/uL (ref 4.0–10.5)
nRBC: 0 % (ref 0.0–0.2)

## 2022-03-23 LAB — COMPREHENSIVE METABOLIC PANEL
ALT: 22 U/L (ref 0–44)
AST: 18 U/L (ref 15–41)
Albumin: 3.2 g/dL — ABNORMAL LOW (ref 3.5–5.0)
Alkaline Phosphatase: 71 U/L (ref 38–126)
Anion gap: 8 (ref 5–15)
BUN: 20 mg/dL (ref 8–23)
CO2: 24 mmol/L (ref 22–32)
Calcium: 8.5 mg/dL — ABNORMAL LOW (ref 8.9–10.3)
Chloride: 101 mmol/L (ref 98–111)
Creatinine, Ser: 1.21 mg/dL (ref 0.61–1.24)
GFR, Estimated: 60 mL/min — ABNORMAL LOW (ref >60)
Glucose, Bld: 96 mg/dL (ref 70–99)
Potassium: 3.6 mmol/L (ref 3.5–5.1)
Sodium: 133 mmol/L — ABNORMAL LOW (ref 135–145)
Total Bilirubin: 1.2 mg/dL (ref 0.3–1.2)
Total Protein: 5.8 g/dL — ABNORMAL LOW (ref 6.5–8.1)

## 2022-03-23 LAB — LIPID PANEL
Cholesterol: 125 mg/dL (ref 0–200)
HDL: 51 mg/dL (ref >40)
LDL Cholesterol: 61 mg/dL (ref 0–99)
Total CHOL/HDL Ratio: 2.5 RATIO
Triglycerides: 66 mg/dL (ref <150)
VLDL: 13 mg/dL (ref 0–40)

## 2022-03-23 LAB — CORTISOL-AM, BLOOD: Cortisol - AM: 17 ug/dL (ref 6.7–22.6)

## 2022-03-23 MED ORDER — ACETAMINOPHEN 325 MG PO TABS
650.0000 mg | ORAL_TABLET | Freq: Once | ORAL | Status: AC
  Administered 2022-03-23: 650 mg via ORAL
  Filled 2022-03-23: qty 2

## 2022-03-23 MED ORDER — MELATONIN 3 MG PO TABS
3.0000 mg | ORAL_TABLET | Freq: Every day | ORAL | Status: DC
Start: 2022-03-23 — End: 2022-03-26
  Administered 2022-03-23 – 2022-03-25 (×4): 3 mg via ORAL
  Filled 2022-03-23 (×4): qty 1

## 2022-03-23 MED ORDER — SACUBITRIL-VALSARTAN 97-103 MG PO TABS
1.0000 | ORAL_TABLET | Freq: Two times a day (BID) | ORAL | Status: DC
  Administered 2022-03-23 – 2022-03-26 (×7): 1 via ORAL
  Filled 2022-03-23 (×8): qty 1

## 2022-03-23 MED ORDER — CLONIDINE HCL 0.2 MG/24HR TD PTWK: 0.2000 mg | MEDICATED_PATCH | TRANSDERMAL | Status: DC

## 2022-03-23 MED ORDER — MELATONIN 3 MG PO TABS: 3.0000 mg | ORAL_TABLET | Freq: Every day | ORAL | Status: DC

## 2022-03-23 MED ORDER — HYDRALAZINE HCL 50 MG PO TABS
100.0000 mg | ORAL_TABLET | Freq: Three times a day (TID) | ORAL | Status: DC
Start: 2022-03-23 — End: 2022-03-26
  Administered 2022-03-23 – 2022-03-26 (×10): 100 mg via ORAL
  Filled 2022-03-23 (×10): qty 2

## 2022-03-23 MED ORDER — SENNA 8.6 MG PO TABS
1.0000 | ORAL_TABLET | Freq: Every day | ORAL | Status: DC
  Administered 2022-03-23 – 2022-03-24 (×2): 8.6 mg via ORAL
  Filled 2022-03-23 (×3): qty 1

## 2022-03-23 MED ORDER — CLONIDINE HCL 0.2 MG/24HR TD PTWK
0.2000 mg | MEDICATED_PATCH | TRANSDERMAL | Status: DC
  Administered 2022-03-23: 0.2 mg via TRANSDERMAL
  Filled 2022-03-23: qty 1

## 2022-03-23 MED ORDER — POLYETHYLENE GLYCOL 3350 17 G PO PACK
17.0000 g | PACK | Freq: Two times a day (BID) | ORAL | Status: DC
  Administered 2022-03-23 – 2022-03-24 (×3): 17 g via ORAL
  Filled 2022-03-23 (×6): qty 1

## 2022-03-23 NOTE — Progress Notes (Addendum)
Family Medicine Teaching Service ?Daily Progress Note ?Intern Pager: [phone] ? ?Patient name: Nicholas Shelton Medical record number: [id number] ?Date of birth: [birth year] Age: 81 y.o. Gender: male ?Primary Care Provider: Christain Sacramento, MD ?Consultants: Cardiology ?Code Status: Full ? ?Pt Overview and Major Events to Date:  ?5/13 admitted ?5/14 IR renal perfusion study ? ?Assessment and Plan: ? ?Nicholas Shelton is an 81 year old male presenting with severe HTN requiring nitro drip. 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 ?Severe asymptomatic HTN ?BP yesterday 144-192/48-138. Closer to 170s over 60s when I saw him.  Nitro drip now weaned to 24 mL/hr. 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 Asymptomatic-no abdominal pain.  ?-Cardiology following, appreciate recs ?-nitro drip-plan to wean over 24-48 hours ?-entresto 97-103 mg twice daily ?-hydralazine 100 mg TID ?-clonidine patch 0.2 mg weekly ? ?HFrEF (EF 35-40% G2DD)  Aortic stenosis s/p TAVR w bioprosthetic valve ?Appears euvolemic today. Did not receive diuretics yesterday. Weight today 79.2 from 79.7 yesterday. ?-monitor need for diuretics ?-strict I's and O's ?-daily weights ? ?L ICA aneurysm-4 mm saccular, incidental  ?-Followed by IR outpatient.  ?-Patient prefers endovascular repair over monitoring ? ?PAF, hx of complete heart block ?Heart rates have been in the 60s and 70s. ?-eliquis 5 mg BID ?-cardiac monitoring ? ?T2DM ?Glucose was 142 this morning. ?-monitor on BMP ? ?Constipation ?Takes milk of magnesia every three days ?-miralax BID ?-senna daily ?-milk of magnesia  ? ?Chronic/stable ?HLD-atorvastatin 10 mg ?Chronic bilateral infarcts, left retinal artery occlusion-clopidogrel 75 mg daily  and aspiring ?GERD-protonix ?Normocytic anemia chronic-monitor on CBC ? ?FEN/GI: heart healthy ?PPx: eliquis ?Dispo: home pending off nitro drip ? ?Subjective:  ?Patient denies any headache, vision changes, shortness of breath or chest pain. Discussed plan to wean nitro as tolerated before he can go home ? ?Objective: ?Temp:  [98.9 ?F (37.2 ?C)] 98.9 ?F (37.2 ?C) (05/15 2130) ?Pulse Rate:  [56-91] 68 (05/15 0806) ?Resp:  [13-22] 18 (05/15 0806) ?BP: (144-192)/(48-138) 175/64 (05/15 0806) ?SpO2:  [93 %-98 %] 97 % (05/15 0806) ?Weight:  [79.2 kg] 79.2 kg (05/15 0500) ?Physical Exam: ?General: NAD, laying in bed comfortably, alert and responsive to all questions ?Cardiovascular: Regular rate, no murmurs, rubs or gallops ?Respiratory: Clear to auscultation bilaterally no wheezes rales or crackles ?Abdomen: Nontender to palpation, soft ?Extremities: No lower extremity edema ? ?Laboratory: ?Recent Labs  ?Lab 03/22/22 ?1200 03/23/22 ?0442  ?WBC 6.5 5.8  ?HGB 11.1* 10.0*  ?HCT 32.8* 29.0*  ?PLT 126* 108*  ? ?Recent Labs  ?Lab 03/22/22 ?1200 03/23/22 ?0442  ?NA 133* 133*  ?K 4.4 3.6  ?CL 101 101  ?CO2 23 24  ?BUN 28* 20  ?CREATININE 1.36* 1.21  ?CALCIUM 9.4 8.5*  ?PROT 6.6 5.8*  ?BILITOT 0.9 1.2  ?ALKPHOS 82 71  ?ALT 30 22  ?AST 21 18  ?GLUCOSE 108* 96  ? ? ?Imaging/Diagnostic Tests: ?VAS US RENAL ARTERY DUPLEX ? ?Result Date: 03/23/2022 ?ABDOMINAL VISCERAL Patient Name:  Nicholas Shelton  Date of Exam:   03/23/2022 Medical Rec #: [id number]   Accession #:    [id number] Date of Birth: [birth year]   Patient Gender: M Patient Age:   39 years Exam Location:  [facility] Procedure:      VAS US RENAL ARTERY DUPLEX Referring  Phys: [place] -------------------------------------------------------------------------------- Indications: Hypertension High Risk Factors: Hypertension, hyperlipidemia, Diabetes, past history of                    smoking. Limitations: Air/bowel gas. Comparison Study: 12-05-2021 Prior CTA  abdomen pelvis Performing Technologist: Darlin Coco RDMS, RVT  Examination Guidelines: A complete evaluation includes B-mode imaging, spectral Doppler, color Doppler, and power Doppler as needed of all accessible portions of each vessel. Bilateral testing is considered an integral part of a complete examination. Limited examinations for reoccurring indications may be performed as noted.  Duplex Findings: +--------------------+--------+--------+------+--------+ Mesenteric          PSV cm/sEDV cm/sPlaqueComments +--------------------+--------+--------+------+--------+ Aorta Mid              96                          +--------------------+--------+--------+------+--------+ Celiac Artery Origin  190                          +--------------------+--------+--------+------+--------+ SMA Proximal          502                          +--------------------+--------+--------+------+--------+    +------------------+--------+--------+-------+ Right Renal ArteryPSV cm/sEDV cm/sComment +------------------+--------+--------+-------+ Origin               87      23           +------------------+--------+--------+-------+ Proximal            133      26           +------------------+--------+--------+-------+ Mid                 105      24           +------------------+--------+--------+-------+ Distal              109      23           +------------------+--------+--------+-------+ +-----------------+--------+--------+-------+ Left Renal ArteryPSV cm/sEDV cm/sComment +-----------------+--------+--------+-------+ Origin             152      22           +-----------------+--------+--------+-------+ Proximal           175      26           +-----------------+--------+--------+-------+ Mid                147      22           +-----------------+--------+--------+-------+ Distal              68      15           +-----------------+--------+--------+-------+  +------------+--------+--------+----+-----------+--------+--------+----+ Right KidneyPSV cm/sEDV cm/sRI  Left KidneyPSV cm/sEDV cm/sRI   +------------+--------+--------+----+-----------+--------+--------+----+ Upper Pole  20      7       0.64Upper Pole 26      7       0.72 +------------+--------+--------+----+-----------+--------+--------+----+ Mid         25      9       0.62Mid        57      10      0.80 +------------+--------+--------+----+-----------+--------+--------+----+ Lower Pole  28  9       0.70Lower Pole 26      5       0.80 +------------+--------+--------+----+-----------+--------+--------+----+ Hilar       39      8       0.79Hilar      89      10      0.88 +------------+--------+--------+----+-----------+--------+--------+----+ +------------------+-------+------------------+-------+ Right Kidney             Left Kidney               +------------------+-------+------------------+-------+ RAR                      RAR                       +------------------+-------+------------------+-------+ RAR (manual)      1.39   RAR (manual)      1.82    +------------------+-------+------------------+-------+ Cortex            0.61 RICortex            0.72 RI +------------------+-------+------------------+-------+ Cortex thickness         Corex thickness           +------------------+-------+------------------+-------+ Kidney length (cm)12.68  Kidney length (cm)13.25   +------------------+-------+------------------+-------+  Summary: Renal:  Right: Normal size right kidney. Normal right Resisitive Index. No        evidence of right renal artery stenosis. RRV flow present.        Cyst(s) noted. Left:  Normal size of left kidney. Abnormal left Resisitve Index.        1-59% stenosis of the left renal artery. LRV flow present.        Cyst(s) noted- largest measuring 7.4 x 6.6 x 6.7 cm. 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  *See table(s) above for measurements and observations.  Diagnosing physician: Monica Martinez MD  Electronically signed by Monica Martinez MD on 03/23/2022 at 12:36:47 PM.    Final     ? ?Gerrit Heck, MD ?03/24/2022, 9:24 AM ?PGY-1, [facility] ?[place] Intern pager: [phone], text pages welcome  ?

## 2022-03-23 NOTE — Evaluation (Signed)
Occupational Therapy Evaluation/Discharge ?Patient Details ?Name: Nicholas Shelton ?MRN: [id number] ?DOB: [birth year] ?Today's Date: 03/23/2022 ? ? ?History of Present Illness Pt is an 81 y.o. male who presented 03/22/22 with concern for elevated BP readings at home. 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 ?Clinical Impression ?  ?PTA, pt lives with spouse, ambulatory without AD, and Independent in all daily tasks. Pt assists spouse with light ADLs/stair mgmt at home. Pt presents near baseline for ADLs/mobility with improving steadiness during prolonged activity. BP pre activity - 183/70 and unable to obtain reading after activities due to transport arrival. Pt denies symptoms associated with HTN and denies any concerns in managing at home. OT to sign off at acute level with no further OT needs indicated. Please reconsult if needs change.  ?   ? ?Recommendations for follow up therapy are one component of a multi-disciplinary discharge planning process, led by the attending physician.  Recommendations may be updated based on patient status, additional functional criteria and insurance authorization.  ? ?Follow Up Recommendations ? No OT follow up  ?  ?Assistance Recommended at Discharge PRN  ?Patient can return home with the following   ? ?  ?Functional Status Assessment ? Patient has not had a recent decline in their functional status  ?Equipment Recommendations ? None recommended by OT  ?  ?Recommendations for Other Services   ? ? ?  ?Precautions / Restrictions Precautions ?Precautions: Other (comment) ?Precaution Comments: Monitor BP; goal SBP < 180 ?Restrictions ?Weight Bearing Restrictions: No  ? ?  ? ?Mobility Bed Mobility ?Overal bed mobility: Modified Independent ?  ?  ?  ?  ?  ?  ?  ?  ? ?Transfers ?Overall transfer level:  Independent ?Equipment used: None ?  ?  ?  ?  ?  ?  ?  ?  ?  ? ?  ?Balance Overall balance assessment: Mild deficits observed, not formally tested ?  ?  ?  ?  ?  ?  ?  ?  ?  ?  ?  ?  ?  ?  ?  ?  ?  ?  ?   ? ?ADL either performed or assessed with clinical judgement  ? ?ADL Overall ADL's : Modified independent ?  ?  ?  ?  ?  ?  ?  ?  ?  ?  ?  ?  ?  ?  ?  ?  ?  ?  ?  ?General ADL Comments: able to mobilize in room without AD, stand at sink and lean over sink for grooming tasks, manage LB dressing tasks  ? ? ? ?Vision Baseline Vision/History: 1 Wears glasses ?Ability to See in Adequate Light: 0 Adequate ?Patient Visual Report: No change from baseline;Other (comment) (hx of double vision one month ago s/p retinal occlusion) ?Vision Assessment?: Vision impaired- to be further tested in functional context ?Additional Comments: hx of double vision s/p retinal occluasion last month; denies new concerns today  ?   ?Perception   ?  ?Praxis   ?  ? ?Pertinent Vitals/Pain Pain Assessment ?Pain Assessment: Faces ?Faces Pain Scale: Hurts a little bit ?Pain Location: back ?Pain Descriptors / Indicators: Sore ?Pain Intervention(s): Monitored during session  ? ? ? ?Hand Dominance Right ?  ?Extremity/Trunk Assessment Upper Extremity Assessment ?Upper Extremity Assessment: Overall WFL for tasks assessed ?  ?  Lower Extremity Assessment ?Lower Extremity Assessment: Defer to PT evaluation ?  ?Cervical / Trunk Assessment ?Cervical / Trunk Assessment: Normal ?  ?Communication Communication ?Communication: HOH ?  ?Cognition Arousal/Alertness: Awake/alert ?Behavior During Therapy: [facility] for tasks assessed/performed ?Overall Cognitive Status: Within Functional Limits for tasks assessed ?  ?  ?  ?  ?  ?  ?  ?  ?  ?  ?  ?  ?  ?  ?  ?  ?  ?  ?  ?General Comments  BP 183/90 pre activity; unable to assess after activity (after PT session; transport awaiting pt) ? ?  ?Exercises   ?  ?Shoulder Instructions    ? ? ?Home Living Family/patient expects to be  discharged to:: Private residence ?Living Arrangements: Spouse/significant other ?Available Help at Discharge: Family (wife is disabled) ?Type of Home: House ?Home Access: Stairs to enter ?Entrance Stairs-Number of Steps: 7-8 ?Entrance Stairs-Rails: Right;Left;Can reach both ?Home Layout: One level ?  ?  ?Bathroom Shower/Tub: Gaffer;Tub/shower unit (one of each) ?  ?Bathroom Toilet: Handicapped height ?Bathroom Accessibility: Yes ?  ?Home Equipment: Cane - single point;Rolling Walker (2 wheels);Grab bars - tub/shower ?  ?  ?  ? ?  ?Prior Functioning/Environment Prior Level of Function : Independent/Modified Independent;Driving ?  ?  ?  ?  ?  ?  ?Mobility Comments: Not using assistive device but has a cane if needed ?ADLs Comments: Independent with ADLs, IADLs; retired Building control surveyor. Assists wife with socks/shoes and light stair mgmt ?  ? ?  ?  ?OT Problem List:   ?  ?   ?OT Treatment/Interventions:    ?  ?OT Goals(Current goals can be found in the care plan section) Acute Rehab OT Goals ?Patient Stated Goal: have BP under control ?OT Goal Formulation: All assessment and education complete, DC therapy  ?OT Frequency:   ?  ? ?Co-evaluation   ?  ?  ?  ?  ? ?  ?AM-PAC OT "6 Clicks" Daily Activity     ?Outcome Measure Help from another person eating meals?: None ?Help from another person taking care of personal grooming?: None ?Help from another person toileting, which includes using toliet, bedpan, or urinal?: None ?Help from another person bathing (including washing, rinsing, drying)?: None ?Help from another person to put on and taking off regular upper body clothing?: None ?Help from another person to put on and taking off regular lower body clothing?: None ?6 Click Score: 24 ?  ?End of Session Nurse Communication: Mobility status ? ?Activity Tolerance: Patient tolerated treatment well ?Patient left: Other (comment) (with PT) ? ?OT Visit Diagnosis: Unsteadiness on feet (R26.81)  ?              ?Time: 0881-1031 ?OT  Time Calculation (min): 12 min ?Charges:  OT General Charges ?$OT Visit: 1 Visit ?OT Evaluation ?$OT Eval Low Complexity: 1 Low ? ?Malachy Chamber, OTR/L ?Acute Rehab Services ?Office: [phone]  ? ?Layla Maw ?03/23/2022, 8:33 AM ?

## 2022-03-23 NOTE — Progress Notes (Addendum)
Progress Note ? ?Patient Name: Nicholas Shelton ?Date of Encounter: 03/23/2022 ? ?Attending physician: Lind Covert, MD ?Primary care provider: Christain Sacramento, MD ?Primary Cardiologist: Rex Kras, DO, [facility] ? ?Subjective: ?Carlosdaniel Grob is a 81 y.o. male who was seen and examined at bedside  ?Denies chest pain or headaches. ?No events overnight. ?Case discussed and reviewed with his nurse. ? ?Objective: ?Vital Signs in the last 24 hours: ?Temp:  [99.4 ?F (37.4 ?C)] 99.4 ?F (37.4 ?C) (05/14 0550) ?Pulse Rate:  [43-84] 66 (05/14 1000) ?Resp:  [11-27] 14 (05/14 1000) ?BP: (157-228)/(53-96) 190/74 (05/14 1000) ?SpO2:  [91 %-100 %] 98 % (05/14 1000) ?Weight:  [79.7 kg-82.1 kg] 79.7 kg (05/14 0550) ? ?Intake/Output: ? ?Intake/Output Summary (Last 24 hours) at 03/23/2022 1117 ?Last data filed at 03/23/2022 1000 ?Gross per 24 hour  ?Intake 1081.42 ml  ?Output 950 ml  ?Net 131.42 ml  ?  ?Net IO Since Admission: 131.42 mL [03/23/22 1117] ? ?Weights:  ?Filed Weights  ? 03/22/22 1121 03/23/22 0550  ?Weight: 82.1 kg 79.7 kg  ? ? ?Telemetry: Personally reviewed.  Probable sinus without any significant dysrhythmias ? ?Physical examination: ?PHYSICAL EXAM: ? ?  03/23/2022  ? 10:00 AM 03/23/2022  ?  8:00 AM 03/23/2022  ?  7:00 AM  ?Vitals with BMI  ?Systolic 540 086 761  ?Diastolic 74 72 58  ?Pulse 66 61 54  ? ? ?General: Well-developed, hard of hearing, hemodynamically stable, no acute distress. ?HEENT: Normocephalic, atraumatic, no scleral icterus or xanthelasmas, no JVP, bilateral carotid bruits. ?Heart: Regular rate and rhythm, positive S1-S2, no murmurs rubs or gallops appreciated. ?Abdomen: Soft, nontender, nondistended, positive bowel sounds in all 4 quadrants, no ascites. ?Extremities: No peripheral edema, +2 DP and PT pulses. ?Neuro: Alert oriented x4, nonfocal, normal muscle tone. ?Psych: Normal mood and affect, normal behavior, cooperative ?No change in physcial examination since yesterday's encounter.  ? ?Lab  Results: ?Hematology ?Recent Labs  ?Lab 03/22/22 ?1200 03/23/22 ?0442  ?WBC 6.5 5.8  ?RBC 3.75* 3.37*  ?HGB 11.1* 10.0*  ?HCT 32.8* 29.0*  ?MCV 87.5 86.1  ?MCH 29.6 29.7  ?MCHC 33.8 34.5  ?RDW 13.2 13.2  ?PLT 126* 108*  ? ? ?Chemistry ?Recent Labs  ?Lab 03/22/22 ?1200 03/23/22 ?0442  ?NA 133* 133*  ?K 4.4 3.6  ?CL 101 101  ?CO2 23 24  ?GLUCOSE 108* 96  ?BUN 28* 20  ?CREATININE 1.36* 1.21  ?CALCIUM 9.4 8.5*  ?PROT 6.6 5.8*  ?ALBUMIN 3.9 3.2*  ?AST 21 18  ?ALT 30 22  ?ALKPHOS 82 71  ?BILITOT 0.9 1.2  ?GFRNONAA 52* 60*  ?ANIONGAP 9 8  ?  ? ?Cardiac Enzymes: ?Cardiac Panel (last 3 results) ?No results for input(s): CKTOTAL, CKMB, TROPONINIHS, RELINDX in the last 72 hours. ? ?BNP (last 3 results) ?Recent Labs  ?  12/20/21 ?1117 03/22/22 ?1305  ?BNP 2,724.1* 4,452.1*  ? ? ?ProBNP (last 3 results) ?Recent Labs  ?  12/30/21 ?1205 03/03/22 ?1151 03/14/22 ?1250  ?PROBNP 2,011* 4,258* 3,403*  ? ? ? ?DDimer No results for input(s): DDIMER in the last 168 hours.  ? ?Hemoglobin A1c:  ?Lab Results  ?Component Value Date  ? HGBA1C 6.2 (H) 02/19/2022  ? MPG 131.24 02/19/2022  ? ? ?TSH  ?Recent Labs  ?  03/03/22 ?1151  ?TSH 0.602  ? ? ?Lipid Panel  ?Lab Results  ?Component Value Date  ? CHOL 125 03/23/2022  ? HDL 51 03/23/2022  ? [place] 61 03/23/2022  ? TRIG 66 03/23/2022  ?  CHOLHDL 2.5 03/23/2022  ? ?Imaging: ?DG Chest 2 View ? ?Result Date: 03/22/2022 ?CLINICAL DATA:  Fatigue.  Uncontrolled hypertension. EXAM: CHEST - 2 VIEW COMPARISON:  03/03/2022 FINDINGS: 1340 hours. Low volume film. The cardio pericardial silhouette is enlarged. Status post TAVR. Basilar atelectasis with possible trace right pleural effusion. Vascular congestion evident without overt airspace pulmonary edema. Bones are diffusely demineralized. IMPRESSION: Low volume film with basilar atelectasis and possible trace right pleural effusion. Electronically Signed   By: Misty Stanley M.D.   On: 03/22/2022 14:06  ? ?VAS US RENAL ARTERY DUPLEX ? ?Result Date:  03/23/2022 ?ABDOMINAL VISCERAL Patient Name:  [place] Code  Date of Exam:   03/23/2022 Medical Rec #: [id number]   Accession #:    [id number] Date of Birth: 06-16-1941   Patient Gender: M Patient Age:   2 years Exam Location:  [facility] Procedure:      VAS US RENAL ARTERY DUPLEX Referring Phys: [place] -------------------------------------------------------------------------------- Indications: Hypertension High Risk Factors: Hypertension, hyperlipidemia, Diabetes, past history of                    smoking. Limitations: Air/bowel gas. Comparison Study: 12-05-2021 Prior CTA abdomen pelvis Performing Technologist: Darlin Coco RDMS, RVT  Examination Guidelines: A complete evaluation includes B-mode imaging, spectral Doppler, color Doppler, and power Doppler as needed of all accessible portions of each vessel. Bilateral testing is considered an integral part of a complete examination. Limited examinations for reoccurring indications may be performed as noted.  Duplex Findings: +--------------------+--------+--------+------+--------+ Mesenteric          PSV cm/sEDV cm/sPlaqueComments +--------------------+--------+--------+------+--------+ Aorta Mid              96                          +--------------------+--------+--------+------+--------+ Celiac Artery Origin  190                          +--------------------+--------+--------+------+--------+ SMA Proximal          502                          +--------------------+--------+--------+------+--------+    +------------------+--------+--------+-------+ Right Renal ArteryPSV cm/sEDV cm/sComment +------------------+--------+--------+-------+ Origin               87      23           +------------------+--------+--------+-------+ Proximal            133      26           +------------------+--------+--------+-------+ Mid                 105      24            +------------------+--------+--------+-------+ Distal              109      23           +------------------+--------+--------+-------+ +-----------------+--------+--------+-------+ Left Renal ArteryPSV cm/sEDV cm/sComment +-----------------+--------+--------+-------+ Origin             152      22           +-----------------+--------+--------+-------+ Proximal           175      26           +-----------------+--------+--------+-------+ Mid  147      22           +-----------------+--------+--------+-------+ Distal              68      15           +-----------------+--------+--------+-------+ +------------+--------+--------+----+-----------+--------+--------+----+ Right KidneyPSV cm/sEDV cm/sRI  Left KidneyPSV cm/sEDV cm/sRI   +------------+--------+--------+----+-----------+--------+--------+----+ Upper Pole  20      7       0.64Upper Pole 26      7       0.72 +------------+--------+--------+----+-----------+--------+--------+----+ Mid         25      9       0.62Mid        57      10      0.80 +------------+--------+--------+----+-----------+--------+--------+----+ Lower Pole  28      9       0.70Lower Pole 26      5       0.80 +------------+--------+--------+----+-----------+--------+--------+----+ Hilar       39      8       0.79Hilar      89      10      0.88 +------------+--------+--------+----+-----------+--------+--------+----+ +------------------+-------+------------------+-------+ Right Kidney             Left Kidney               +------------------+-------+------------------+-------+ RAR                      RAR                       +------------------+-------+------------------+-------+ RAR (manual)      1.39   RAR (manual)      1.82    +------------------+-------+------------------+-------+ Cortex            0.61 RICortex            0.72 RI  +------------------+-------+------------------+-------+ Cortex thickness         Corex thickness           +------------------+-------+------------------+-------+ Kidney length (cm)12.68  Kidney length (cm)13.25   +------------------+-------+------------------+-------+   Summary: Renal:  Right: Normal size right kidney. Normal right Resisitive Index. No        evidence of right renal artery stenosis. RRV flow present.        Cyst(s) noted. Left:  Normal

## 2022-03-23 NOTE — Progress Notes (Incomplete)
?   03/23/22 1900 03/23/22 1905 03/23/22 1930  ?Vitals  ?BP (!) 188/70 (!) 177/138 (!) 176/60  ? ? 03/23/22 2000  ?Vitals  ?BP (!) 164/60  ? ? ?

## 2022-03-23 NOTE — Evaluation (Signed)
Physical Therapy Evaluation & Discharge ?Patient Details ?Name: Nicholas Shelton ?MRN: [id number] ?DOB: 13-Sep-1941 ?Today's Date: 03/23/2022 ? ?History of Present Illness ? Pt is an 81 y.o. male who presented 03/22/22 with concern for elevated BP readings at home. 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  ?Clinical Impression ? Pt presents with condition above. PTA, he was IND without DME, living with his wife in a 1-level house with 7-8 STE. Pt appears to be functioning at his baseline, with noted stiffness in his R knee during gait which he reports is normal after being in bed for a while. Provided supervision for safety as pt does display some mild balance deficits, but he did not have any LOB or need for physical assistance. Pt used x1 handrail on the stairs, but otherwise did not need UE support for mobility. All education completed and questions answered. As pt appears to be and reports to be at his baseline, PT will sign off.  ?   ? ?Recommendations for follow up therapy are one component of a multi-disciplinary discharge planning process, led by the attending physician.  Recommendations may be updated based on patient status, additional functional criteria and insurance authorization. ? ?Follow Up Recommendations No PT follow up ? ?  ?Assistance Recommended at Discharge PRN  ?Patient can return home with the following ?  (N/A) ? ?  ?Equipment Recommendations None recommended by PT  ?Recommendations for Other Services ?    ?  ?Functional Status Assessment Patient has not had a recent decline in their functional status  ? ?  ?Precautions / Restrictions Precautions ?Precautions: Other (comment) ?Precaution Comments: Monitor BP; goal SBP < 180 ?Restrictions ?Weight Bearing Restrictions: No  ? ?  ? ?Mobility ? Bed Mobility ?  ?  ?  ?  ?  ?  ?   ?General bed mobility comments: Pt standing with OT upon arrival. ?  ? ?Transfers ?  ?  ?  ?  ?  ?  ?  ?  ?  ?General transfer comment: Pt standing with OT upon arrival. ?  ? ?Ambulation/Gait ?Ambulation/Gait assistance: Supervision ?Gait Distance (Feet): 180 Feet ?Assistive device: None ?Gait Pattern/deviations: Step-through pattern, Decreased step length - right ?Gait velocity: reduced ?Gait velocity interpretation: 1.31 - 2.62 ft/sec, indicative of limited community ambulator ?  ?General Gait Details: Pt with fairly steady gait with stiffness noted with R swing phase and decreased R step length. Pt reports hx of R TKA and stiffness after being in bed for a while. No LOB, Supervision for safety ? ?Stairs ?Stairs: Yes ?Stairs assistance: Supervision ?Stair Management: One rail Left, Step to pattern, Alternating pattern, Forwards ?Number of Stairs: 8 ?General stair comments: Ascending with step-to pattern initially but progressing to reciprocal pattern by final x4 stairs, using L handrail for support. Descending with step-to pattern and L handrail. No LOB, supervision for safety ? ?Wheelchair Mobility ?  ? ?Modified Rankin (Stroke Patients Only) ?  ? ?  ? ?Balance Overall balance assessment: Mild deficits observed, not formally tested ?  ?  ?  ?  ?  ?  ?  ?  ?  ?  ?  ?  ?  ?  ?  ?  ?  ?  ?   ? ? ? ?Pertinent Vitals/Pain Pain Assessment ?Pain Assessment: Faces ?Faces Pain Scale: Hurts a little bit ?Pain  Location: low back ?Pain Descriptors / Indicators: Sore ?Pain Intervention(s): Limited activity within patient's tolerance, Monitored during session, Repositioned  ? ? ?Home Living Family/patient expects to be discharged to:: Private residence ?Living Arrangements: Spouse/significant other ?Available Help at Discharge: Family (wife is disabled) ?Type of Home: House ?Home Access: Stairs to enter ?Entrance Stairs-Rails: Right;Left;Can reach both ?Entrance Stairs-Number of Steps: 7-8 ?  ?Home Layout: One level ?Home  Equipment: Cane - single point;Rolling Walker (2 wheels);Grab bars - tub/shower ?   ?  ?Prior Function Prior Level of Function : Independent/Modified Independent;Driving ?  ?  ?  ?  ?  ?  ?Mobility Comments: Not using assistive device but has a cane if needed ?ADLs Comments: Independent with ADLs, IADLs; retired Building control surveyor. Assists wife with socks/shoes and light stair mgmt ?  ? ? ?Hand Dominance  ? Dominant Hand: Right ? ?  ?Extremity/Trunk Assessment  ? Upper Extremity Assessment ?Upper Extremity Assessment: Overall WFL for tasks assessed ?  ? ?Lower Extremity Assessment ?Lower Extremity Assessment: Defer to PT evaluation ?  ? ?Cervical / Trunk Assessment ?Cervical / Trunk Assessment: Normal  ?Communication  ? Communication: HOH  ?Cognition Arousal/Alertness: Awake/alert ?Behavior During Therapy: [facility] for tasks assessed/performed ?Overall Cognitive Status: Within Functional Limits for tasks assessed ?  ?  ?  ?  ?  ?  ?  ?  ?  ?  ?  ?  ?  ?  ?  ?  ?  ?  ?  ? ?  ?General Comments General comments (skin integrity, edema, etc.): BP 183/90 pre activity; unable to assess after activity (after PT session; transport awaiting pt) ? ?  ?Exercises    ? ?Assessment/Plan  ?  ?PT Assessment Patient does not need any further PT services  ?PT Problem List   ? ?   ?  ?PT Treatment Interventions     ? ?PT Goals (Current goals can be found in the Care Plan section)  ?Acute Rehab PT Goals ?Patient Stated Goal: did not state ?PT Goal Formulation: All assessment and education complete, DC therapy ?Time For Goal Achievement: 03/24/22 ?Potential to Achieve Goals: Good ? ?  ?Frequency   ?  ? ? ?Co-evaluation   ?  ?  ?  ?  ? ? ?  ?AM-PAC PT "6 Clicks" Mobility  ?Outcome Measure Help needed turning from your back to your side while in a flat bed without using bedrails?: None ?Help needed moving from lying on your back to sitting on the side of a flat bed without using bedrails?: None ?Help needed moving to and from a bed to a chair (including a  wheelchair)?: None ?Help needed standing up from a chair using your arms (e.g., wheelchair or bedside chair)?: None ?Help needed to walk in hospital room?: A Little ?Help needed climbing 3-5 steps with a railing? : A Little ?6 Click Score: 22 ? ?  ?End of Session   ?Activity Tolerance: Patient tolerated treatment well ?Patient left: Other (comment) (in w/c with transport) ?  ?PT Visit Diagnosis: Other abnormalities of gait and mobility (R26.89);Unsteadiness on feet (R26.81) ?  ? ?Time: 7622-6333 ?PT Time Calculation (min) (ACUTE ONLY): 8 min ? ? ?Charges:   PT Evaluation ?$PT Eval Low Complexity: 1 Low ?  ?  ?   ? ? ?Moishe Spice, PT, DPT ?Acute Rehabilitation Services  ?Pager: [phone] ?Office: [phone] ? ? ?Maretta Bees Pettis ?03/23/2022, 9:20 AM ? ?

## 2022-03-23 NOTE — Progress Notes (Signed)
Family Medicine Teaching Service ?Daily Progress Note ?Intern Pager: [phone] ? ?Patient name: Nicholas Shelton Medical record number: [id number] ?Date of birth: 1941-08-19 Age: 81 y.o. Gender: male ? ?Primary Care Provider: Christain Sacramento, MD ?Consultants: Cardiology ?Code Status: Full ? ?Pt Overview and Major Events to Date:  ?5/13 admitted ?5/14 IR renal perfusion study ? ?Assessment and Plan: ?Nicholas Shelton is a 81 y.o. male presenting with severe hypertension requiring nitro drip.  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 ?  ?Severe asymptomatic hypertension ?Blood pressure is improved overnight on nitro drip, isosorbide dinitrate 30 mg 3 times daily, and clonidine patch 0.1 mg.  Goal SBP 147, systolics overnight range 160-201, most consistently 160-170s.  Cardiology restarted home meds of hydralazine 100 mg 3 times daily and Entresto 97-103 twice daily. ?- Titrate nitro gtt per protocol ?- Awaiting a.m. cortisol, urine metanephrines, aldosterone and renin activity ?- Continue to monitor monitor closely ?- Continue neurochecks ?  ?HFrEF (LVEF 35-40%, G2DD)  Aortic stenosis s/p TAVR w/ bioprosthetic valve ?Euvolemic on exam today. Received IV Lasix 40 mg x 1 yesterday with decent UOP.  Currently holding home spironolactone given AKI on admission, now resolved.  Daily weight down about 1.5 kg.  ?- No diuretics today given he appears euvolemic ?- Strict I's and O's ?- Daily weights ?  ?Left ICA anueyrsm- 66m saccular; incidental finding ?Followed o/p by IR. MRI and MRA brain on admission for admission w/ BRAO found incidental ICA aneurysm. 2-3% 5-year rupture risk, 7% lifetime rupture risk. Patient prefers endovascular repair as opposed to monitoring. ?  ?Paroxysmal  fibrillation, hx complete heart block ?Patient is currently asymptomatic.  Continue Eliquis 5 mg twice daily and cardiac monitoring. ?  ?HLD ?Home medication for atorvastatin 10 mg daily.  LDL 61 on admission, at goal. ?  ?Chronic bilateral infarcts; left retinal artery occlusion ?Continue clopidogrel 75 mg daily and aspirin. ?  ?Non-insulin dependent T2DM ?A1c at goal, takes metformin at home.  No glucose checks here in patient aside from BMPs. ?  ?GERD ?Continue home Protonix. ?  ?Chronic normocytic anemia, likely 2/2 chronic disease ?Stable. ? ?FEN/GI: Heart healthy ?PPx: Full dose Eliquis ?Dispo:Home pending clinical improvement . Barriers include continued medical work-up.  ? ?Subjective:  ?Patient seen after IR renal perfusion study.  Son at bedside.  Patient appears to be in good spirits, has no complaints.  Denies SOB, chest pain, palpitations. ? ?Objective: ?Temp:  [99.4 ?F (37.4 ?C)] 99.4 ?F (37.4 ?C) (05/14 0550) ?Pulse Rate:  [43-84] 66 (05/14 1000) ?Resp:  [11-27] 14 (05/14 1000) ?BP: (157-228)/(53-96) 167/69 (05/14 1117) ?SpO2:  [91 %-100 %] 98 % (05/14 1000) ?Weight:  [79.7 kg] 79.7 kg (05/14 0550) ? ?General: Awake, alert, oriented, no acute distress ?Cardiac: Regular rate and rhythm, 2/5 systolic murmur at right second intercostal space ?Respiratory: CTAB, clear bases without fluid ?Abdomen: Nondistended, soft ?Extremities: Trace edema of BLE, equal bilaterally ? ?Laboratory: ?Recent Labs  ?Lab 03/22/22 ?1200 03/23/22 ?0442  ?WBC 6.5 5.8  ?HGB 11.1* 10.0*  ?HCT 32.8* 29.0*  ?PLT 126* 108*  ? ?Recent Labs  ?Lab 03/22/22 ?1200 03/23/22 ?0442  ?NA 133* 133*  ?K 4.4 3.6  ?CL 101 101  ?CO2 23 24  ?BUN 28* 20  ?CREATININE 1.36* 1.21  ?CALCIUM 9.4 8.5*  ?PROT 6.6  5.8*  ?BILITOT 0.9 1.2  ?ALKPHOS 82 71  ?ALT 30 22  ?AST 21 18  ?GLUCOSE 108* 96  ? ?Imaging/Diagnostic Tests: ?CHEST - 2 VIEW 03/22/22  ?IMPRESSION: ?Low volume film with basilar atelectasis and possible trace right ?pleural effusion. ? ? ?Ezequiel Essex, MD ?03/23/2022, 12:31 PM ?PGY-2, [place] ?[place] Intern pager: [phone], text pages welcome ? ?

## 2022-03-24 ENCOUNTER — Other Ambulatory Visit (HOSPITAL_COMMUNITY): Payer: Self-pay

## 2022-03-24 DIAGNOSIS — I10 Essential (primary) hypertension: Secondary | ICD-10-CM | POA: Diagnosis not present

## 2022-03-24 LAB — GLUCOSE, CAPILLARY
Glucose-Capillary: 142 mg/dL — ABNORMAL HIGH (ref 70–99)
Glucose-Capillary: 179 mg/dL — ABNORMAL HIGH (ref 70–99)

## 2022-03-24 LAB — BASIC METABOLIC PANEL
Anion gap: 8 (ref 5–15)
BUN: 17 mg/dL (ref 8–23)
CO2: 24 mmol/L (ref 22–32)
Calcium: 8.2 mg/dL — ABNORMAL LOW (ref 8.9–10.3)
Chloride: 100 mmol/L (ref 98–111)
Creatinine, Ser: 1.21 mg/dL (ref 0.61–1.24)
GFR, Estimated: 60 mL/min — ABNORMAL LOW (ref >60)
Glucose, Bld: 191 mg/dL — ABNORMAL HIGH (ref 70–99)
Potassium: 3.3 mmol/L — ABNORMAL LOW (ref 3.5–5.1)
Sodium: 132 mmol/L — ABNORMAL LOW (ref 135–145)

## 2022-03-24 MED ORDER — METOPROLOL SUCCINATE ER 25 MG PO TB24
25.0000 mg | ORAL_TABLET | Freq: Every day | ORAL | Status: DC
  Administered 2022-03-24 – 2022-03-26 (×3): 25 mg via ORAL
  Filled 2022-03-24 (×3): qty 1

## 2022-03-24 MED ORDER — FUROSEMIDE 10 MG/ML IJ SOLN
40.0000 mg | Freq: Once | INTRAMUSCULAR | Status: AC
  Administered 2022-03-24: 40 mg via INTRAVENOUS
  Filled 2022-03-24: qty 4

## 2022-03-24 MED ORDER — MAGNESIUM HYDROXIDE 400 MG/5ML PO SUSP
15.0000 mL | Freq: Every day | ORAL | Status: AC
  Administered 2022-03-24: 15 mL via ORAL
  Filled 2022-03-24: qty 30

## 2022-03-24 MED ORDER — ISOSORBIDE DINITRATE 20 MG PO TABS
40.0000 mg | ORAL_TABLET | Freq: Three times a day (TID) | ORAL | Status: DC
  Administered 2022-03-24 – 2022-03-26 (×6): 40 mg via ORAL
  Filled 2022-03-24 (×8): qty 2

## 2022-03-24 MED ORDER — MAGNESIUM HYDROXIDE 400 MG/5ML PO SUSP: 15.0000 mL | Freq: Every day | ORAL | Status: DC

## 2022-03-24 MED ORDER — SPIRONOLACTONE 25 MG PO TABS
25.0000 mg | ORAL_TABLET | Freq: Every day | ORAL | Status: DC
  Administered 2022-03-24 – 2022-03-26 (×3): 25 mg via ORAL
  Filled 2022-03-24 (×3): qty 1

## 2022-03-24 NOTE — Progress Notes (Signed)
FPTS Brief Progress Note ? ?S Saw patient at bedside this evening. Patient was sleeping comfortably. I did not wake the patient. ? ?O: ?BP (!) 151/48   Pulse 68   Temp 98.9 ?F (37.2 ?C) (Oral)   Resp (!) 21   Ht '5\' 7"'$  (1.702 m)   Wt 79.7 kg   SpO2 96%   BMI 27.52 kg/m?   ?General: Sleeping comfortably  ?CV: Regular rhythm ? ?A/P: ?Severe HTN ?BP improving well. Systolics in 694W and 546E ?On nitro gtt and home antihypertensive regimen ? ?Plan per day team  ?- Orders reviewed. Labs for AM ordered, which was adjusted as needed.  ? ?Orvis Brill, DO ?03/24/2022, 1:14 AM ?PGY-3, [place] [facility] Night Resident  ?Please page [phone] with questions.   ?

## 2022-03-24 NOTE — Progress Notes (Signed)
Patient has a disable wife at home. He is questioning what is the plan and goals for getting his blood pressure under control so that he is able to go home and care for his wife. ?

## 2022-03-24 NOTE — Hospital Course (Addendum)
Severe asymptomatic hypertension ?Admitted w/ SBP > 200 in setting of hx left ICA aneurysm on anticoagulation. Remained asymptomatic throughout hospital stay. Cardiology was consulted and patient was started on nitro drip. 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  A.m. cortisol was 17.0 on discharge was weaned off of nitro drip and discharged on clonidine patch 0.2 milligrams weekly, hydralazine 100 mg 3 times daily, Isordil 40 mg 3 times daily, Toprol-XL 25 mg daily, Entresto 97-103 twice daily, spironolactone 12.5 mg daily. ? 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 IV diureses performed and cardiology followed. Weight at discharge was 78.9 kg (on admission was 82.1 kg).  ? ?Left ICA anueyrsm- 38m saccular; incidental finding ?MRI and MRA brain on admission for admission w/ BRAO found incidental ICA aneurysm. 2-3% 5-year rupture risk, 7% lifetime rupture risk.  Would like endovascular repair - IR saw patient in the hospital and said once stable on antihypertensive regimen for a while to have cardiology schedule for aneurysm treatment endovascularly. ? ?Paroxysmal Atrial Fibrillation ?Remained stable on Toprol-XL and continued on eliquis 5 mg BID ? ?Chronic bilateral infarcts; left retinal artery occlusion ?Completed 3 weeks DAPT, then supposed to continue clopidogrel alone indefinitely, per neurology. Clopidogrel 75 mg daily continued in hospital and aspirin stopped. ? ?

## 2022-03-24 NOTE — Progress Notes (Signed)
Progress Note ? ?Patient Name: Nicholas Shelton ?Date of Encounter: 03/24/2022 ? ?Attending physician: Lind Covert, MD ?Primary care provider: Christain Sacramento, MD ?Primary Cardiologist: Rex Kras, DO, [facility] ? ?Subjective: ?Nicholas Shelton is a 81 y.o. male who was seen and examined at bedside  ?Blood pressures are improving but still requires nitro drip. ?No focal neurological deficits. ? ?Objective: ?Vital Signs in the last 24 hours: ?Temp:  [98.5 ?F (36.9 ?C)-98.9 ?F (37.2 ?C)] 98.5 ?F (36.9 ?C) (05/15 1156) ?Pulse Rate:  [56-91] 60 (05/15 1156) ?Resp:  [11-22] 14 (05/15 1156) ?BP: (140-192)/(48-138) 154/64 (05/15 1156) ?SpO2:  [93 %-100 %] 100 % (05/15 1156) ?Weight:  [79.2 kg] 79.2 kg (05/15 0500) ? ?Intake/Output: ? ?Intake/Output Summary (Last 24 hours) at 03/24/2022 1236 ?Last data filed at 03/24/2022 [phone] ?Gross per 24 hour  ?Intake 1052.36 ml  ?Output 850 ml  ?Net 202.36 ml  ?  ?Net IO Since Admission: 333.78 mL [03/24/22 1236] ? ?Weights:  ?Filed Weights  ? 03/22/22 1121 03/23/22 0550 03/24/22 0500  ?Weight: 82.1 kg 79.7 kg 79.2 kg  ? ? ?Telemetry: Personally reviewed.  Probable sinus without any significant dysrhythmias ? ?Physical examination: ?PHYSICAL EXAM: ? ?  03/24/2022  ? 11:56 AM 03/24/2022  ? 11:25 AM 03/24/2022  ? 10:56 AM  ?Vitals with BMI  ?Systolic 751 025 852  ?Diastolic 64 60 61  ?Pulse 60 58 65  ? ? ?General: Well-developed, hard of hearing, hemodynamically stable, no acute distress. ?HEENT: Normocephalic, atraumatic, no scleral icterus or xanthelasmas, no JVP, bilateral carotid bruits. ?Heart: Regular rate and rhythm, positive S1-S2, no murmurs rubs or gallops appreciated. ?Abdomen: Soft, nontender, nondistended, positive bowel sounds in all 4 quadrants, no ascites. ?Extremities: No peripheral edema, +2 DP and PT pulses. ?Neuro: Alert oriented x4, nonfocal, normal muscle tone. ?Psych: Normal mood and affect, normal behavior, cooperative ? ? ?Lab Results: ?Hematology ?Recent Labs  ?Lab  03/22/22 ?1200 03/23/22 ?0442  ?WBC 6.5 5.8  ?RBC 3.75* 3.37*  ?HGB 11.1* 10.0*  ?HCT 32.8* 29.0*  ?MCV 87.5 86.1  ?MCH 29.6 29.7  ?MCHC 33.8 34.5  ?RDW 13.2 13.2  ?PLT 126* 108*  ? ? ?Chemistry ?Recent Labs  ?Lab 03/22/22 ?1200 03/23/22 ?7782 03/24/22 ?0955  ?NA 133* 133* 132*  ?K 4.4 3.6 3.3*  ?CL 101 101 100  ?CO2 '23 24 24  '$ ?GLUCOSE 108* 96 191*  ?BUN 28* 20 17  ?CREATININE 1.36* 1.21 1.21  ?CALCIUM 9.4 8.5* 8.2*  ?PROT 6.6 5.8*  --   ?ALBUMIN 3.9 3.2*  --   ?AST 21 18  --   ?ALT 30 22  --   ?ALKPHOS 82 71  --   ?BILITOT 0.9 1.2  --   ?GFRNONAA 52* 60* 60*  ?ANIONGAP '9 8 8  '$ ?  ? ?Cardiac Enzymes: ?Cardiac Panel (last 3 results) ?No results for input(s): CKTOTAL, CKMB, TROPONINIHS, RELINDX in the last 72 hours. ? ?BNP (last 3 results) ?Recent Labs  ?  12/20/21 ?1117 03/22/22 ?1305  ?BNP 2,724.1* 4,452.1*  ? ? ?ProBNP (last 3 results) ?Recent Labs  ?  12/30/21 ?1205 03/03/22 ?1151 03/14/22 ?1250  ?PROBNP 2,011* 4,258* 3,403*  ? ? ? ?DDimer No results for input(s): DDIMER in the last 168 hours.  ? ?Hemoglobin A1c:  ?Lab Results  ?Component Value Date  ? HGBA1C 6.2 (H) 02/19/2022  ? MPG 131.24 02/19/2022  ? ? ?TSH  ?Recent Labs  ?  03/03/22 ?1151  ?TSH 0.602  ? ? ?Lipid Panel  ?Lab Results  ?Component  Value Date  ? CHOL 125 03/23/2022  ? HDL 51 03/23/2022  ? [place] 61 03/23/2022  ? TRIG 66 03/23/2022  ? CHOLHDL 2.5 03/23/2022  ? ?Imaging: ?DG Chest 2 View ? ?Result Date: 03/22/2022 ?CLINICAL DATA:  Fatigue.  Uncontrolled hypertension. EXAM: CHEST - 2 VIEW COMPARISON:  03/03/2022 FINDINGS: 1340 hours. Low volume film. The cardio pericardial silhouette is enlarged. Status post TAVR. Basilar atelectasis with possible trace right pleural effusion. Vascular congestion evident without overt airspace pulmonary edema. Bones are diffusely demineralized. IMPRESSION: Low volume film with basilar atelectasis and possible trace right pleural effusion. Electronically Signed   By: Misty Stanley M.D.   On: 03/22/2022 14:06  ? ?VAS  US RENAL ARTERY DUPLEX ? ?Result Date: 03/23/2022 ?ABDOMINAL VISCERAL Patient Name:  Nicholas Shelton  Date of Exam:   03/23/2022 Medical Rec #: [id number]   Accession #:    [id number] Date of Birth: 09/21/1941   Patient Gender: M Patient Age:   2 years Exam [facility] Procedure:      VAS US RENAL ARTERY DUPLEX Referring Phys: [place] -------------------------------------------------------------------------------- Indications: Hypertension High Risk Factors: Hypertension, hyperlipidemia, Diabetes, past history of                    smoking. Limitations: Air/bowel gas. Comparison Study: 12-05-2021 Prior CTA abdomen pelvis Performing Technologist: Darlin Coco RDMS, RVT  Examination Guidelines: A complete evaluation includes B-mode imaging, spectral Doppler, color Doppler, and power Doppler as needed of all accessible portions of each vessel. Bilateral testing is considered an integral part of a complete examination. Limited examinations for reoccurring indications may be performed as noted.  Duplex Findings: +--------------------+--------+--------+------+--------+ Mesenteric          PSV cm/sEDV cm/sPlaqueComments +--------------------+--------+--------+------+--------+ Aorta Mid              96                          +--------------------+--------+--------+------+--------+ Celiac Artery Origin  190                          +--------------------+--------+--------+------+--------+ SMA Proximal          502                          +--------------------+--------+--------+------+--------+    +------------------+--------+--------+-------+ Right Renal ArteryPSV cm/sEDV cm/sComment +------------------+--------+--------+-------+ Origin               87      23           +------------------+--------+--------+-------+ Proximal            133      26           +------------------+--------+--------+-------+ Mid                 105      24            +------------------+--------+--------+-------+ Distal              109      23           +------------------+--------+--------+-------+ +-----------------+--------+--------+-------+ Left Renal ArteryPSV cm/sEDV cm/sComment +-----------------+--------+--------+-------+ Origin             152      22           +-----------------+--------+--------+-------+ Proximal  175      26           +-----------------+--------+--------+-------+ Mid                147      22           +-----------------+--------+--------+-------+ Distal              68      15           +-----------------+--------+--------+-------+ +------------+--------+--------+----+-----------+--------+--------+----+ Right KidneyPSV cm/sEDV cm/sRI  Left KidneyPSV cm/sEDV cm/sRI   +------------+--------+--------+----+-----------+--------+--------+----+ Upper Pole  20      7       0.64Upper Pole 26      7       0.72 +------------+--------+--------+----+-----------+--------+--------+----+ Mid         25      9       0.62Mid        57      10      0.80 +------------+--------+--------+----+-----------+--------+--------+----+ Lower Pole  28      9       0.70Lower Pole 26      5       0.80 +------------+--------+--------+----+-----------+--------+--------+----+ Hilar       39      8       0.79Hilar      89      10      0.88 +------------+--------+--------+----+-----------+--------+--------+----+ +------------------+-------+------------------+-------+ Right Kidney             Left Kidney               +------------------+-------+------------------+-------+ RAR                      RAR                       +------------------+-------+------------------+-------+ RAR (manual)      1.39   RAR (manual)      1.82    +------------------+-------+------------------+-------+ Cortex            0.61 RICortex            0.72 RI  +------------------+-------+------------------+-------+ Cortex thickness         Corex thickness           +------------------+-------+------------------+-------+ Kidney length (cm)12.68  Kidney length (cm)13.25   +------------------+-------+------------------+-------+  Summary: Renal:  Right: Normal size right kidney. Normal right Resisitive Index. No        evidence of right renal artery stenosis. RRV

## 2022-03-24 NOTE — Progress Notes (Signed)
? ?Referring Physician(s): ?de Macedo Rodrigues,Sem Mccaughey ? ?Chief Complaint: ?The patient is seen in follow up consult today to discuss left ICA aneurysm management.  ? ? ? ?History of present illness: ? ?Christino Mcglinchey is a 81 year old male with past medical history significant for hypertension, hyperlipidemia, TAVR, gout, generalized anxiety disorder, diabetes and bioprosthetic aortic valve.  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  He then presented to the emergency room for stroke workup.  MRI and MRA of the brain were obtained and were negative for acute stroke.  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  He denies diplopia or other focal neurological deficit.  No family history of brain aneurysm or subarachnoid hemorrhage.  He is a former smoker (19 pack-year), quitting in 1996. He is currently on Eliquis and aspirin. His sister in law is again present during consult. ? ?Since prior consult, we reached out to his cardiologist about estimated procedural risk, given that the patient would like to have his aneurysm treated. Dr. Terri Skains believed that as long as his blood pressure is under control, he would be acceptable risk. However, patient today informs me that he has been feeling significant fatigue and that this is thought to be related to his cardiac medications which will need to be readjusted. He is seeing his cardiologist this afternoon. He has no new neurological complaints. ? ?Past Medical History:  ?Diagnosis Date  ? Actinic keratosis   ? Arthritis   ? AV block   ? Bradycardia   ? Degeneration of lumbar or lumbosacral intervertebral disc   ? Deviated nasal septum   ? Diabetes mellitus without complication ([place])   ? Gastroesophageal reflux disease without esophagitis   ? Generalized anxiety disorder   ? Gout with tophus   ? Hearing loss   ? Hyperlipidemia   ? Hypertension   ? Insomnia   ?  Murmur   ? Aortic stenosis  ? Nasal turbinate hypertrophy   ? Osteoarthritis of wrist   ? PAC (premature atrial contraction)   ? Peptic ulcer   ? Peripheral neuropathy   ? PVC (premature ventricular contraction)   ? 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 with 5m S3AUR via [place] approach with Dr.Cooper and Dr. BCyndia Bent ? Seasonal allergic rhinitis due to pollen   ? ? ?Past Surgical History:  ?Procedure Laterality Date  ? COLONOSCOPY    ? ELBOW SURGERY    ? pinched nerve on both arms  ? INGUINAL HERNIA REPAIR Right 10/30/2018  ? Procedure: OPEN REPAIR OF INCARCERATED RIGHT INGUINAL HERNIA WITH MESH;  Surgeon: WGreer Pickerel MD;  [facility]  Service: General;  Laterality: Right;  ? LEFT HEART CATH AND CORONARY ANGIOGRAPHY N/A 01/15/2021  ? Procedure: LEFT HEART CATH AND CORONARY ANGIOGRAPHY;  Surgeon: GAdrian Prows MD;  [facility];  Service: Cardiovascular;  Laterality: N/A;  ? TEE WITHOUT CARDIOVERSION N/A 12/24/2021  ? Procedure: TRANSESOPHAGEAL ECHOCARDIOGRAM (TEE);  Surgeon: CSherren Mocha MD;  [facility]  Service: Open Heart Surgery;  Laterality: N/A;  ? 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  ? Procedure: TOTAL KNEE ARTHROPLASTY;  Surgeon: CSydnee Cabal MD;  Location: [facility];  Service: Orthopedics;  Laterality: Right;  with adductor canal  ? WRIST SURGERY    ? pinched nerve  ? ? ?Allergies: ?Trazodone and nefazodone ? ?Medications: ?Prior to Admission medications   ?Medication Sig Start Date  End Date Taking? Authorizing Provider  ?acetaminophen (TYLENOL) 500 MG tablet Take 500 mg by mouth every 8 (eight) hours as needed for moderate pain.    [provider]  ?apixaban (ELIQUIS) 5 MG TABS tablet Take 1 tablet (5 mg total) by mouth 2 (two) times daily. 03/03/22 06/01/22  Rex Kras, DO  ?aspirin EC 81 MG tablet Take 81 mg by mouth daily. Swallow whole.    [provider]  ?atorvastatin (LIPITOR) 10 MG tablet Take 10 mg by mouth daily. 02/25/22    [provider]  ?cloNIDine (CATAPRES) 0.2 MG tablet Take 1 tablet (0.2 mg total) by mouth 2 (two) times daily. ?Patient taking differently: Take 0.1 mg by mouth daily. Take 1/2 tablet daily 01/29/22 04/29/22  Tolia, Sunit, DO  ?furosemide (LASIX) 20 MG tablet Take 20 mg by mouth.    [provider]  ?hydrALAZINE (APRESOLINE) 100 MG tablet Take 1 tablet (100 mg total) by mouth 3 (three) times daily. 02/17/22   Tolia, Sunit, DO  ?isosorbide dinitrate (ISORDIL) 30 MG tablet Take 1 tablet (30 mg total) by mouth 3 (three) times daily. ?Patient taking differently: Take 30 mg by mouth 2 (two) times daily. 01/31/22   Tolia, Sunit, DO  ?metFORMIN (GLUCOPHAGE) 1000 MG tablet Take 1 tablet (1,000 mg total) by mouth 2 (two) times daily with a meal. 01/17/21   Adrian Prows, MD  ?Multiple Vitamin (MULTI VITAMIN MENS PO) Take by mouth.    [provider]  ?pantoprazole (PROTONIX) 40 MG tablet Take 40 mg by mouth daily before breakfast.    [provider]  ?Psyllium 28.3 % POWD Take by mouth.    [provider]  ?spironolactone (ALDACTONE) 25 MG tablet Take 1 tablet (25 mg total) by mouth every morning. ?Patient taking differently: Take 12.5 mg by mouth every morning. 03/03/22 06/01/22  Rex Kras, DO  ?  ? ?Family History  ?Problem Relation Age of Onset  ? Heart attack Mother   ? ? ?Social History  ? ?Socioeconomic History  ? Marital status: Married  ?  Spouse name: Not on file  ? Number of children: 3  ? Years of education: Not on file  ? Highest education level: Not on file  ?Occupational History  ? Not on file  ?Tobacco Use  ? Smoking status: Former  ?  Packs/day: 0.50  ?  Years: 15.00  ?  Pack years: 7.50  ?  Types: Cigarettes  ?  Quit date: 35  ?  Years since quitting: 27.3  ? Smokeless tobacco: Never  ?Vaping Use  ? Vaping Use: Never used  ?Substance and Sexual Activity  ? Alcohol use: No  ? Drug use: No  ? Sexual activity: Not on file  ?  Comment: married  ?Other Topics Concern  ?  Not on file  ?Social History Narrative  ? Not on file  ? ?Social Determinants of Health  ? ?Financial Resource Strain: Not on file  ?Food Insecurity: Not on file  ?Transportation Needs: Not on file  ?Physical Activity: Not on file  ?Stress: Not on file  ?Social Connections: Not on file  ? ? ? ?Vital Signs: ?There were no vitals taken for this visit. ? ?Physical Exam ?Constitutional:   ?   Appearance: He is ill-appearing.  ?Neurological:  ?   General: No focal deficit present.  ?   Mental Status: He is alert and oriented to person, place, and time. Mental status is at baseline.  ? ? ?Imaging: ?DG Chest 2  View ? ?Result Date: 03/22/2022 ?CLINICAL DATA:  Fatigue.  Uncontrolled hypertension. EXAM: CHEST - 2 VIEW COMPARISON:  03/03/2022 FINDINGS: 1340 hours. Low volume film. The cardio pericardial silhouette is enlarged. Status post TAVR. Basilar atelectasis with possible trace right pleural effusion. Vascular congestion evident without overt airspace pulmonary edema. Bones are diffusely demineralized. IMPRESSION: Low volume film with basilar atelectasis and possible trace right pleural effusion. Electronically Signed   By: Misty Stanley M.D.   On: 03/22/2022 14:06  ? ?VAS US RENAL ARTERY DUPLEX ? ?Result Date: 03/23/2022 ?ABDOMINAL VISCERAL Patient Name:  Omaree Uballe  Date of Exam:   03/23/2022 Medical Rec #: [id number]   Accession #:    [id number] Date of Birth: [birth year]   Patient Gender: M Patient Age:   24 years Exam Location:  [facility] Procedure:      VAS US RENAL ARTERY DUPLEX Referring Phys: [place] -------------------------------------------------------------------------------- Indications: Hypertension High Risk Factors: Hypertension, hyperlipidemia, Diabetes, past history of                    smoking. Limitations: Air/bowel gas. Comparison Study: 12-05-2021 Prior CTA abdomen pelvis Performing Technologist: Darlin Coco RDMS, RVT  Examination Guidelines: A complete evaluation includes  B-mode imaging, spectral Doppler, color Doppler, and power Doppler as needed of all accessible portions of each vessel. Bilateral testing is considered an integral part of a complete examination. Limited ex

## 2022-03-24 NOTE — TOC Benefit Eligibility Note (Signed)
Patient Advocate Encounter ?  ?Insurance verification completed.   ?  ?The patient is currently admitted and upon discharge could be taking FARXIGA 10 MG. ?  ?The current 30 day co-pay is, $37.  ? ?The patient is currently admitted and upon discharge could be taking JARDIANCE 10 MG. ?  ?The current 30 day co-pay is, $37.  ? ?The patient is insured through [employer]. ? ? ?   ?

## 2022-03-24 NOTE — Progress Notes (Signed)
Upon reviewing patient's chart, BP in 120s/40s. I called RN to ensure that Nitro gtt was continuing to be weaned. The patient is on 15 mcg right now, will turn of drip and monitor BP to see how he does. If rebound hypertension ensues, can resume drip. Do not want to further drop diastolic BP, and current BP is within goal. ? ?Blood pressure (!) 128/45, pulse 67, temperature 98.8 ?F (37.1 ?C), temperature source Oral, resp. rate 18, height '5\' 7"'$  (1.702 m), weight 79.2 kg, SpO2 100 %.  ? ?Orvis Brill, DO ?PGY-1 [place] ?03/24/2022 11:57 PM ?

## 2022-03-24 NOTE — Plan of Care (Signed)
°  Problem: Activity: °Goal: Ability to return to baseline activity level will improve °Outcome: Progressing °  °Problem: Cardiovascular: °Goal: Ability to achieve and maintain adequate cardiovascular perfusion will improve °Outcome: Progressing °  °

## 2022-03-25 DIAGNOSIS — I10 Essential (primary) hypertension: Secondary | ICD-10-CM | POA: Diagnosis not present

## 2022-03-25 LAB — BASIC METABOLIC PANEL
Anion gap: 8 (ref 5–15)
BUN: 22 mg/dL (ref 8–23)
CO2: 26 mmol/L (ref 22–32)
Calcium: 7.9 mg/dL — ABNORMAL LOW (ref 8.9–10.3)
Chloride: 99 mmol/L (ref 98–111)
Creatinine, Ser: 1.34 mg/dL — ABNORMAL HIGH (ref 0.61–1.24)
GFR, Estimated: 53 mL/min — ABNORMAL LOW (ref >60)
Glucose, Bld: 121 mg/dL — ABNORMAL HIGH (ref 70–99)
Potassium: 3.2 mmol/L — ABNORMAL LOW (ref 3.5–5.1)
Sodium: 133 mmol/L — ABNORMAL LOW (ref 135–145)

## 2022-03-25 LAB — CBC
HCT: 27.3 % — ABNORMAL LOW (ref 39.0–52.0)
Hemoglobin: 9.3 g/dL — ABNORMAL LOW (ref 13.0–17.0)
MCH: 29.2 pg (ref 26.0–34.0)
MCHC: 34.1 g/dL (ref 30.0–36.0)
MCV: 85.6 fL (ref 80.0–100.0)
Platelets: 102 10*3/uL — ABNORMAL LOW (ref 150–400)
RBC: 3.19 MIL/uL — ABNORMAL LOW (ref 4.22–5.81)
RDW: 13 % (ref 11.5–15.5)
WBC: 4.4 10*3/uL (ref 4.0–10.5)
nRBC: 0 % (ref 0.0–0.2)

## 2022-03-25 LAB — GLUCOSE, CAPILLARY
Glucose-Capillary: 133 mg/dL — ABNORMAL HIGH (ref 70–99)
Glucose-Capillary: 228 mg/dL — ABNORMAL HIGH (ref 70–99)
Glucose-Capillary: 113 mg/dL — ABNORMAL HIGH (ref 70–99)
Glucose-Capillary: 148 mg/dL — ABNORMAL HIGH (ref 70–99)
Glucose-Capillary: 134 mg/dL — ABNORMAL HIGH (ref 70–99)

## 2022-03-25 MED ORDER — DAPAGLIFLOZIN PROPANEDIOL 10 MG PO TABS
10.0000 mg | ORAL_TABLET | Freq: Every day | ORAL | Status: DC
  Administered 2022-03-25 – 2022-03-26 (×2): 10 mg via ORAL
  Filled 2022-03-25 (×2): qty 1

## 2022-03-25 MED ORDER — POTASSIUM CHLORIDE 20 MEQ PO PACK
40.0000 meq | PACK | Freq: Once | ORAL | Status: AC
  Administered 2022-03-25: 40 meq via ORAL
  Filled 2022-03-25: qty 2

## 2022-03-25 NOTE — Progress Notes (Addendum)
FPTS Brief Progress Note ? ?S: BP low to 120s/40s earlier, nitro has been d/c'd. Patient sleeping. ?Reviewed telemetry: patient in rate controlled a fib. ? ? ?O: ?BP (!) 124/43 (BP Location: Right Arm)   Pulse 67   Temp 98.8 ?F (37.1 ?C) (Oral)   Resp 14   Ht '5\' 7"'$  (1.702 m)   Wt 79.2 kg   SpO2 99%   BMI 27.35 kg/m?   ? ? ?A/P: ?- Nitro drip off. Continue to monitor BP. ?- Orders reviewed. Labs for AM ordered, which was adjusted as needed.  ?- See daily progress note for plan details. ? ?Gladys Damme, MD ?03/25/2022, 3:32 AM ?PGY-3, [facility] Night Resident  ?Please page [phone] with questions.  ? ? ?

## 2022-03-25 NOTE — Progress Notes (Signed)
Family Medicine Teaching Service ?Daily Progress Note ?Intern Pager: [phone] ? ?Patient name: Nicholas Shelton Medical record number: [id number] ?Date of birth: Mar 14, 1941 Age: 81 y.o. Gender: male ? ?Primary Care Provider: Christain Sacramento, MD ?Consultants: Cardiology ?Code Status: Full ? ?Pt Overview and Major Events to Date:  ?5/13 admitted ?5/14 IR renal perfusion study ? ?Assessment and Plan: ? ?Nicholas Shelton is an 81 year old male presenting with severe HTN requiring nitro drip. 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  ?Severe asymptomatic hypertension, resolving ?Blood pressure ranges have been 124-178/43-72.  140s over 150s this morning.  Was weaned off of nitro drip last night.  Has remained asymptomatic without headache, vision changes, chest pain or shortness of breath. ?-Cardiology following, appreciate recommendations ?-Farxiga 10 mg daily added today ?-Hydralazine 100 mg 3 times daily ?-Isordil 40 milligrams 3 times daily 9-metoprolol XL 25 mg daily ?-Entresto 97-103 twice daily ?-Clonidine patch 0.2 mg weekly ?-Spironolactone 25 mg daily ? 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  Weight is 78.4 kg from 79.2 yesterday. ?-Strict I's and O's ?-Daily weights ? ?Hypokalemia ?3.2 today ?-repleted 40 meq ?-monitor ? ?L ICA aneurysm-4 mm saccular, incidental ?-Followed by IR outpatient ?-Patient prefers endovascular repair over monitoring ? ?PAF, history of complete heart block ?Was in A-fib last night heart rates have been in the 50s this morning.  Denies any chest pain. ?-Eliquis 5 mg twice daily ?-Cardiac monitoring ? ?T2DM ?Glucose was 228 this morning. ?-Monitor on BMP ? ?Constipation,  resolved ?Says he had a bowel movement yesterday. ?-MiraLAX twice daily ?-Senna daily ?-Milk of magnesia as needed ? ?Chronic/stable ?HLD-atorvastatin 10 mg ?Chronic bilateral infarcts, left retinal artery occlusion-Plavix 75 mg daily and aspirin ?GERD-Protonix ?Normocytic anemia-hemoglobin 9.3 from 10 yesterday ? ?FEN/GI: Heart healthy ?PPx: Eliquis ?Dispo: Home likely today or tomorrow ? ?Subjective:  ?Patient is completely asymptomatic without any headaches vision changes shortness of breath or chest pain.  Has disabled wife at home and would like to go if possible  ? ?Objective: ?Temp:  [98.1 ?F (36.7 ?C)-98.9 ?F (37.2 ?C)] 98.1 ?F (36.7 ?C) (05/16 0341) ?Pulse Rate:  [52-72] 52 (05/16 0341) ?Resp:  [11-22] 17 (05/16 0532) ?BP: (124-178)/(43-74) 138/59 (05/16 0532) ?SpO2:  [97 %-100 %] 99 % (05/16 0341) ?Weight:  [78.4 kg] 78.4 kg (05/16 0341) ?Physical Exam: ?General: NAD, laying in bed comfortably ?Cardiovascular: Irregularly irregular, no murmurs rubs or gallops ?Respiratory: CTA B, no wheezes rales or crackles ?Abdomen: Nontender to palpation, soft ?Extremities: No lower extremity edema ? ?Laboratory: ?Recent Labs  ?Lab 03/22/22 ?1200 03/23/22 ?5462 03/25/22 ?0409  ?WBC 6.5 5.8 4.4  ?HGB 11.1* 10.0* 9.3*  ?HCT 32.8* 29.0* 27.3*  ?PLT 126* 108* 102*  ? ?Recent Labs  ?Lab 03/22/22 ?1200 03/23/22 ?7035 03/24/22 ?0955 03/25/22 ?0409  ?NA 133* 133* 132* 133*  ?K 4.4 3.6 3.3* 3.2*  ?CL 101 101 100 99  ?CO2 '23 24 24 26  '$ ?BUN 28* '20 17 22  '$ ?CREATININE 1.36* 1.21 1.21 1.34*  ?CALCIUM 9.4 8.5* 8.2* 7.9*  ?PROT 6.6 5.8*  --   --   ?BILITOT 0.9 1.2  --   --   ?ALKPHOS 82 71  --   --   ?ALT 30 22  --   --   ?  AST 21 18  --   --   ?GLUCOSE 108* 96 191* 121*  ? ? ?Imaging/Diagnostic Tests: ?No results found.  ? ?Gerrit Heck, MD ?03/25/2022, 7:21 AM ?PGY-1, [facility] ?[place] Intern pager: [phone], text pages welcome  ?

## 2022-03-25 NOTE — Progress Notes (Signed)
Family Medicine Teaching Service ?Daily Progress Note ?Intern Pager: [phone] ? ?Patient name: Nicholas Shelton Medical record number: [id number] ?Date of birth: [birth year] Age: 81 y.o. Gender: male ? ?Primary Care Provider: Christain Sacramento, MD ?Consultants: Cardiology ?Code Status: Full ? ?Pt Overview and Major Events to Date:  ?5/13 admitted ?5/14 IR renal perfusion study ?5/16 weaned off nitro drip ? ?Assessment and Plan: ? ?Giacomo Valone is an 81 year old male presenting with severe HTN requiring nitro drip. 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 ?Severe asymptomatic hypertension, resolving ?BP ranges of 141-169/49-63. Most recently 140s/60s. Denies headache, SOB, CP, vision changes. Now off nitro drip >24 hours. ?-cardiology following appreciate recs ?-farxiga 10 mg daily ?-hydralazine 100 mg TID ?-isordil 40 gm TID  ?-topro-xl 25 mg daily ?-entresto 97-103 BID ?-clonidine patch 0.2 mg weekly ?-spironolactone 12.5 mg  ? 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 Weight today 78.9 from 78.4 kg yesterday. ?-strict I's and o's ?-daily weights ? ?Hypokalemia ?K 4.0 today. ?-monitor ?-replete prn ? ?L ICA aneurysm-4 mm saccular, incidental finding ?-follow IR outpatient ? ?PAF, Hx of complete heart block ?HR has been 50s. ?-Eliquis 5 mg BID ? ?T2DM ?Glucose today was 120 ?-monitor on BMP ? ?Constipation, resolved ?-miralax BID ?-senna daily ?-milk of magnesia add prn ? ?Chronic/stable ?HLD-atorvastatin 10 mg ?Chronic bilateral infarcts, left retinal artery occlusion-Plavix 75 mg daily and aspirin ?GERD-Protonix ?Normocytic anemia-hemoglobin 9.6 from 9.3 yesterday ? ?FEN/GI: heart healthy ?PPx: eliquis treatment  dose ?Dispo: home likely today ? ?Subjective:  ?Asymptomatic today, no complaint  ? ?Objective: ?Temp:  [97.6 ?F (36.4 ?C)-98.4 ?F (36.9 ?C)] 97.6 ?F (36.4 ?C) (05/16 2124) ?Pulse Rate:  [50-54] 54 (05/16 2124) ?Resp:  [13-19] 19 (05/16 2124) ?BP: (124-147)/(43-63) 147/63 (05/16 2124) ?SpO2:  [98 %-100 %] 98 % (05/16 2124) ?Weight:  [78.4 kg] 78.4 kg (05/16 0341) ?Physical Exam: ?General: NAD, laying in bed, alert and responsive ?Cardiovascular: RRR no m/r/g ?Respiratory: CTAB no w/r/c ?Abdomen: soft, nontender ?Extremities: no lower extremity edema ? ?Laboratory: ?Recent Labs  ?Lab 03/22/22 ?1200 03/23/22 ?1443 03/25/22 ?0409  ?WBC 6.5 5.8 4.4  ?HGB 11.1* 10.0* 9.3*  ?HCT 32.8* 29.0* 27.3*  ?PLT 126* 108* 102*  ? ?Recent Labs  ?Lab 03/22/22 ?1200 03/23/22 ?1540 03/24/22 ?0955 03/25/22 ?0409  ?NA 133* 133* 132* 133*  ?K 4.4 3.6 3.3* 3.2*  ?CL 101 101 100 99  ?CO2 '23 24 24 26  '$ ?BUN 28* '20 17 22  '$ ?CREATININE 1.36* 1.21 1.21 1.34*  ?CALCIUM 9.4 8.5* 8.2* 7.9*  ?PROT 6.6 5.8*  --   --   ?BILITOT 0.9 1.2  --   --   ?ALKPHOS 82 71  --   --   ?ALT 30 22  --   --   ?AST 21 18  --   --   ?GLUCOSE 108* 96 191* 121*  ? ? ?Imaging/Diagnostic Tests: ?No results found.  ? ?Gerrit Heck, MD ?03/25/2022, 11:10 PM ?PGY-1, [place] ?[place] Intern pager: [phone], text pages welcome  ?

## 2022-03-25 NOTE — Progress Notes (Signed)
Progress Note ? ?Patient Name: Nicholas Shelton ?Date of Encounter: 03/26/2022 ? ?Attending physician: No att. providers found ?Primary care provider: Christain [place], MD ?Primary Cardiologist: Rex Kras, DO, [facility] ? ?Subjective: ?Nicholas Shelton is a 81 y.o. male who was seen and examined at bedside  ?Off of nitro drip. ?Blood pressures have improved. ?No focal neurological deficits. ? ?Objective: ?Vital Signs in the last 24 hours: ?Temp:  [97.6 ?F (36.4 ?C)-97.9 ?F (36.6 ?C)] 97.9 ?F (36.6 ?C) (05/17 0355) ?Pulse Rate:  [53-55] 55 (05/17 0852) ?Resp:  [16-19] 16 (05/17 0355) ?BP: (141-169)/(49-63) 167/49 (05/17 0851) ?SpO2:  [98 %-100 %] 100 % (05/17 0355) ?Weight:  [78.9 kg] 78.9 kg (05/17 0355) ? ?Intake/Output: ?No intake or output data in the 24 hours ending 03/26/22 1410 ?  ?Net IO Since Admission: -127.42 mL [03/26/22 1410] ? ?Telemetry: Personally reviewed.  Atrial fibrillation without any significant dysrhythmias ? ?Physical examination: ?PHYSICAL EXAM: ?Blood pressure 138/59, pulse 51, respiratory rate 17 breaths/min, temperature 98.1 ?F, 99% on room air, weight 78.4 kg ?General: Well-developed, hard of hearing, hemodynamically stable, no acute distress. ?HEENT: Normocephalic, atraumatic, no scleral icterus or xanthelasmas, no JVP, bilateral carotid bruits. ?Heart: Regular rate and rhythm, positive S1-S2, no murmurs rubs or gallops appreciated. ?Abdomen: Soft, nontender, nondistended, positive bowel sounds in all 4 quadrants, no ascites. ?Extremities: No peripheral edema, +2 DP and PT pulses. ?Neuro: Alert oriented x4, nonfocal, normal muscle tone. ?Psych: Normal mood and affect, normal behavior, cooperative. ?No significant change in physical examination. ? ?Lab Results: ?Hematology ?Recent Labs  ?Lab 03/23/22 ?2694 03/25/22 ?0409 03/26/22 ?0348  ?WBC 5.8 4.4 4.4  ?RBC 3.37* 3.19* 3.31*  ?HGB 10.0* 9.3* 9.6*  ?HCT 29.0* 27.3* 28.6*  ?MCV 86.1 85.6 86.4  ?MCH 29.7 29.2 29.0  ?MCHC 34.5 34.1 33.6  ?RDW 13.2 13.0 13.2   ?PLT 108* 102* 108*  ? ? ?Chemistry ?Recent Labs  ?Lab 03/22/22 ?1200 03/23/22 ?8546 03/24/22 ?2703 03/25/22 ?0409 03/26/22 ?0348  ?NA 133* 133* 132* 133* 134*  ?K 4.4 3.6 3.3* 3.2* 4.0  ?CL 101 101 100 99 101  ?CO2 '23 24 24 26 25  '$ ?GLUCOSE 108* 96 191* 121* 102*  ?BUN 28* '20 17 22 '$ 30*  ?CREATININE 1.36* 1.21 1.21 1.34* 1.50*  ?CALCIUM 9.4 8.5* 8.2* 7.9* 8.0*  ?PROT 6.6 5.8*  --   --   --   ?ALBUMIN 3.9 3.2*  --   --   --   ?AST 21 18  --   --   --   ?ALT 30 22  --   --   --   ?ALKPHOS 82 71  --   --   --   ?BILITOT 0.9 1.2  --   --   --   ?GFRNONAA 52* 60* 60* 53* 46*  ?ANIONGAP '9 8 8 8 8  '$ ?  ? ?Cardiac Enzymes: ?Cardiac Panel (last 3 results) ?No results for input(s): CKTOTAL, CKMB, TROPONINIHS, RELINDX in the last 72 hours. ? ?BNP (last 3 results) ?Recent Labs  ?  12/20/21 ?1117 03/22/22 ?1305  ?BNP 2,724.1* 4,452.1*  ? ? ?ProBNP (last 3 results) ?Recent Labs  ?  12/30/21 ?1205 03/03/22 ?1151 03/14/22 ?1250  ?PROBNP 2,011* 4,258* 3,403*  ? ? ? ?DDimer No results for input(s): DDIMER in the last 168 hours.  ? ?Hemoglobin A1c:  ?Lab Results  ?Component Value Date  ? HGBA1C 6.2 (H) 02/19/2022  ? MPG 131.24 02/19/2022  ? ? ?TSH  ?Recent Labs  ?  03/03/22 ?1151  ?TSH  0.602  ? ? ?Lipid Panel  ?Lab Results  ?Component Value Date  ? CHOL 125 03/23/2022  ? HDL 51 03/23/2022  ? [place] 61 03/23/2022  ? TRIG 66 03/23/2022  ? CHOLHDL 2.5 03/23/2022  ? ?Imaging: ?No results found. ? ?RADIOLOGY:  ?MRI brain without contrast: ?02/19/2022 ?1. No acute intracranial abnormality. ?2. Moderate chronic small vessel ischemic disease. ?3. 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 ?  ?MRA head without contrast: ?02/19/2022 ?1. Negative intracranial MRA for large vessel occlusion or other emergent finding. ?2. Mild intracranial atherosclerotic disease without hemodynamically significant or correctable stenosis. ?3. 4 mm saccular left PCOM aneurysm. This would likely be amendable to endovascular treatment. Consultation with  neuro interventional radiology suggested. ?  ?CARDIAC DATABASE: ?EKG: ?03/03/22: Atrial fibrillation, 52 bpm, diffuse nonspecific T wave abnormality.  Compared to prior ECG A-fib is new. ?  ?03/20/2022: Sinus bradycardia, with complete heart block, IVCD. ?  ?03/22/2022: Normal sinus rhythm, 65 bpm, right axis, first-degree AV block, nonspecific IVCD, nonspecific T wave abnormality.  Compared to prior EKG complete heart block is resolved. ?  ?Echocardiogram: ?06/18/2021: Normal LV systolic function with visual EF 50-55%.  Mild LVH, grade 2 diastolic dysfunction.  Mildly dilated left atrium, mild MR, moderate TR, RVSP 49 mmHg, insignificant pericardial effusion, severe aortic stenosis.  Moderate aortic valve stenosis. Peak velocity 3.46ms, Peak Gradient 39.8 mmHg, Mean Gradient 27 mmHg, AVA 0.8cm?, DI 0.3.  ?  ?10/21/2021: LVEF 40%, moderately reduced LVEF, global hypokinesis, mild LVH, right ventricular systolic function mildly reduced, RV size normal, moderately dilated left atrium, mildly dilated right atrium, mild MR, mean gradient 30 mmHg, AVA per VTI 0.99 cm?, dimensional index 0.25 (severe AS), estimated RAP 15 mmHg. ? 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 ?  ?Stress Testing: ?Lexiscan Tetrofosmin Stress Test 12/24/2020: ?Nondiagnostic ECG stress. ?The left ventricle is dilated in stress images more than rest images, LV end-diastolic volume 1983mL. There is mild diaphragmatic attenuation artifact in the inferior wall. Superimposed on this there is a moderate-sized moderate decrease in counts in the basal inferior and inferolateral wall suggestive of ischemia. ?Gated SPECT imaging  of the left ventricle was abnormal, demonstrating hypokinesis of the mid inferolateral wall, mid inferior wall and basal inferolateral wall. Stress LV EF is mildly dysfunctional 48%.  ?No previous exam available for comparison. High risk study.  ?  ?Left heart catheterization 01/15/2021: ?LV: Normal LV systolic function.  EDP mildly elevated at 20 mmHg.  There is 22 mmHg peak to peak pressure gradient across the aortic valve.  Findings are consistent with mild aortic stenosis. ?Very mild coronary calcification and minimal luminal irregularity in the coronary vessels.  Right dominant circulation. ?Impression: No significant coronary disease angiography.  Coronary calcification is evident.  Normal LVEF and mild aortic stenosis. 50 mL contrast used. ?  ?Carotid artery duplex 12/19/2020: ?Minimal stenosis in the right internal carotid artery (1-15%). Doppler velocity suggests stenosis in the right Minimal stenosis in the left internal carotid artery (1-15%). Doppler velocity suggests stenosis in the left external carotid artery (<50%). ?Mild heterogeneous plaque noted bilaterally.  ?Antegrade right vertebral artery flow. Antegrade left vertebral artery flow. ?External carotid artery stenosis is probably source of  bruit. Follow up study is appropriate if clinically indicated. ? ?Renal artery duplex: ?03/23/2022. ?Right: Normal size right kidney. Normal right Resisitive Index. No evidence of right renal artery stenosis. RRV flow present.  Cyst(s) noted.  ?Left:  Normal size of left kidney. Abnormal left Resisitve Index. 1-59% stenosis of the left renal artery. LRV flow present. Cyst(s) noted- largest measuring 7.4 x 6.6 x 6.7 cm.  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 ? ?Scheduled Meds: ? apixaban  5 mg Oral BID  ? atorvastatin  10 mg Oral Daily  ? cloNIDine  0.2 mg Transdermal Weekly  ? clopidogrel  75 mg Oral Daily  ? dapagliflozin propanediol  10 mg Oral Daily  ? hydrALAZINE  100 mg Oral TID   ? isosorbide dinitrate  40 mg Oral TID  ? melatonin  3 mg Oral QHS  ? metoprolol succinate  25 mg Oral Daily  ? pantoprazole  40 mg Oral QAC breakfast  ? polyethylene glycol  17 g Oral BID  ? sacubi

## 2022-03-25 NOTE — Care Management (Signed)
?  Transition of Care (TOC) Screening Note ? ? ?Patient Details  ?Name: Nicholas Shelton ?Date of Birth: [birth year] ? ? ?Transition of Care (TOC) CM/SW Contact:    ?Graves-Bigelow, Ocie Cornfield, RN ?Phone Number: ?03/25/2022, 12:21 PM ? ? ? ?Transition of Care Department [facility]) has reviewed the patient and no TOC needs have been identified at this time. We will continue to monitor patient advancement through interdisciplinary progression rounds. If new patient transition needs arise, please place a TOC consult. ?  ?

## 2022-03-25 NOTE — Progress Notes (Signed)
Received secure chat from [facility], DO to titrate off Nitroglycerin drip. Order carried out. Will monitor BP. ? ?

## 2022-03-25 NOTE — Progress Notes (Signed)
FPTS Brief Progress Note ? ?S Saw patient at bedside this evening. Patient was resting comfortably in bed watching TV. No complaints. No chest pain, headache, visual changes. Feeling well. ? ? ?O: ?BP (!) 147/63 (BP Location: Right Arm)   Pulse (!) 54   Temp 97.6 ?F (36.4 ?C) (Oral)   Resp 19   Ht '5\' 7"'$  (1.702 m)   Wt 78.4 kg   SpO2 98%   BMI 27.08 kg/m?   ? ?General: Alert, no acute distress ?Cardio: RRR ?Pulm: Normal WOB ? ?A/P: ?Severe asymptomatic hypertension ?BP improved- in 130s-140s/50s-60s. Will monitor closely. ?Plan per day team  ?- Orders reviewed. Labs for AM ordered, which was adjusted as needed.  ? ?Orvis Brill, DO ?03/25/2022, 10:03 PM ?PGY-3, [place] [facility] Night Resident  ?Please page [phone] with questions.  ? ?

## 2022-03-26 ENCOUNTER — Other Ambulatory Visit (HOSPITAL_COMMUNITY): Payer: Self-pay

## 2022-03-26 LAB — CBC
HCT: 28.6 % — ABNORMAL LOW (ref 39.0–52.0)
Hemoglobin: 9.6 g/dL — ABNORMAL LOW (ref 13.0–17.0)
MCH: 29 pg (ref 26.0–34.0)
MCHC: 33.6 g/dL (ref 30.0–36.0)
MCV: 86.4 fL (ref 80.0–100.0)
Platelets: 108 10*3/uL — ABNORMAL LOW (ref 150–400)
RBC: 3.31 MIL/uL — ABNORMAL LOW (ref 4.22–5.81)
RDW: 13.2 % (ref 11.5–15.5)
WBC: 4.4 10*3/uL (ref 4.0–10.5)
nRBC: 0 % (ref 0.0–0.2)

## 2022-03-26 LAB — BASIC METABOLIC PANEL
Anion gap: 8 (ref 5–15)
BUN: 30 mg/dL — ABNORMAL HIGH (ref 8–23)
CO2: 25 mmol/L (ref 22–32)
Calcium: 8 mg/dL — ABNORMAL LOW (ref 8.9–10.3)
Chloride: 101 mmol/L (ref 98–111)
Creatinine, Ser: 1.5 mg/dL — ABNORMAL HIGH (ref 0.61–1.24)
GFR, Estimated: 46 mL/min — ABNORMAL LOW (ref >60)
Glucose, Bld: 102 mg/dL — ABNORMAL HIGH (ref 70–99)
Potassium: 4 mmol/L (ref 3.5–5.1)
Sodium: 134 mmol/L — ABNORMAL LOW (ref 135–145)

## 2022-03-26 LAB — GLUCOSE, CAPILLARY
Glucose-Capillary: 112 mg/dL — ABNORMAL HIGH (ref 70–99)
Glucose-Capillary: 120 mg/dL — ABNORMAL HIGH (ref 70–99)

## 2022-03-26 LAB — TSH: TSH: 0.532 u[IU]/mL (ref 0.350–4.500)

## 2022-03-26 LAB — BRAIN NATRIURETIC PEPTIDE: B Natriuretic Peptide: 1006.4 pg/mL — ABNORMAL HIGH (ref 0.0–100.0)

## 2022-03-26 MED ORDER — METOPROLOL SUCCINATE ER 25 MG PO TB24
25.0000 mg | ORAL_TABLET | Freq: Every day | ORAL | 1 refills | Status: DC
  Filled 2022-03-26: qty 30, 30d supply, fill #0

## 2022-03-26 MED ORDER — SPIRONOLACTONE 12.5 MG HALF TABLET: 12.5000 mg | ORAL_TABLET | Freq: Every day | ORAL | Status: DC

## 2022-03-26 MED ORDER — ISOSORBIDE DINITRATE 20 MG PO TABS
40.0000 mg | ORAL_TABLET | Freq: Three times a day (TID) | ORAL | 1 refills | Status: DC
  Filled 2022-03-26: qty 180, 30d supply, fill #0

## 2022-03-26 MED ORDER — CLOPIDOGREL BISULFATE 75 MG PO TABS
75.0000 mg | ORAL_TABLET | Freq: Every day | ORAL | 1 refills | Status: DC
  Filled 2022-03-26: qty 30, 30d supply, fill #0

## 2022-03-26 MED ORDER — SPIRONOLACTONE 25 MG PO TABS
12.5000 mg | ORAL_TABLET | Freq: Every morning | ORAL | 0 refills | Status: DC
  Filled 2022-03-26: qty 45, 90d supply, fill #0

## 2022-03-26 MED ORDER — DAPAGLIFLOZIN PROPANEDIOL 10 MG PO TABS
10.0000 mg | ORAL_TABLET | Freq: Every day | ORAL | 1 refills | Status: DC
  Filled 2022-03-26: qty 30, 30d supply, fill #0

## 2022-03-26 MED ORDER — ENTRESTO 97-103 MG PO TABS: 1.0000 | ORAL_TABLET | Freq: Two times a day (BID) | ORAL | 1 refills | Status: AC

## 2022-03-26 MED ORDER — CLONIDINE 0.2 MG/24HR TD PTWK
0.2000 mg | MEDICATED_PATCH | TRANSDERMAL | 12 refills | Status: DC
  Filled 2022-03-26: qty 4, 28d supply, fill #0

## 2022-03-26 NOTE — Discharge Instructions (Addendum)
Dear Nicholas Shelton,  ? ?Thank you so much for allowing [place] to be part of your care!  You were admitted to [facility] for severe hypertension (High BP). We were started on a IV medication to lower it and put on oral medications to keep it low. ? ? ?POST-HOSPITAL & CARE INSTRUCTIONS ?Please take BP meds as prescribed and follow up with PCP and cardiology ?Please let PCP/Specialists know of any changes that were made.  ?Please see medications section of this packet for any medication changes.  ? ?DOCTOR'S APPOINTMENT & FOLLOW UP CARE INSTRUCTIONS  ?Future Appointments  ?Date Time Provider [place]  ?04/04/2022 11:30 AM Tolia, Sunit, DO PCV-PCV None  ?12/03/2022  2:05 PM MC-CV [facility] ECHO 1 [id number] [facility]  ?12/03/2022  3:00 PM CVD-CHURCH STRUCTURAL HEART APP CVD-CHUSTOFF [facility]  ? ? ?RETURN PRECAUTIONS: ?Headache, vision changes, shortness of breath, chest pain. ? ?Take care and be well! ? ?Family Medicine Teaching Service  ?[place]  ?[facility]  ?[address] [place], [place] [address] ?[phone]  ?

## 2022-03-26 NOTE — Plan of Care (Signed)
?  Problem: Education: ?Goal: Knowledge of General Education information will improve ?Description: Including pain rating scale, medication(s)/side effects and non-pharmacologic comfort measures ?Outcome: Adequate for Discharge ?  ?Problem: Education: ?Goal: Understanding of CV disease, CV risk reduction, and recovery process will improve ?Outcome: Adequate for Discharge ?Goal: Individualized Educational Video(s) ?Outcome: Adequate for Discharge ?  ?Problem: Activity: ?Goal: Ability to return to baseline activity level will improve ?Outcome: Adequate for Discharge ?  ?Problem: Cardiovascular: ?Goal: Ability to achieve and maintain adequate cardiovascular perfusion will improve ?Outcome: Adequate for Discharge ?Goal: Vascular access site(s) Level 0-1 will be maintained ?Outcome: Adequate for Discharge ?  ?Problem: Health Behavior/Discharge Planning: ?Goal: Ability to safely manage health-related needs after discharge will improve ?Outcome: Adequate for Discharge ?  ?

## 2022-03-26 NOTE — Progress Notes (Signed)
Progress Note ? ?Patient Name: Satish Hammers ?Date of Encounter: 03/26/2022 ? ?Attending physician: No att. providers found ?Primary care provider: Christain [place], MD ?Primary Cardiologist: Rex Kras, DO, [facility] ? ?Subjective: ?Navid Lenzen is a 81 y.o. male who was seen and examined at bedside  ?Patient sitting up in chair ready to be discharged home. ?Denies angina pectoris or heart failure symptoms. ?Plan of care discussed with nursing staff. ? ?Objective: ?Intake/Output: ?No intake or output data in the 24 hours ending 03/26/22 1422 ?  ?Net IO Since Admission: -127.42 mL [03/26/22 1422] ? ?Telemetry: Personally reviewed.  Atrial fibrillation controlled ventricular rate ? ?Physical examination: ?PHYSICAL EXAM: ?Temp:  [97.6 ?F (36.4 ?C)-97.9 ?F (36.6 ?C)] 97.9 ?F (36.6 ?C) (05/17 0355) ?Pulse Rate:  [53-55] 55 (05/17 0852) ?Cardiac Rhythm: Atrial fibrillation (05/17 0739) ?Resp:  [16-19] 16 (05/17 0355) ?BP: (141-169)/(49-63) 167/49 (05/17 0851) ?SpO2:  [98 %-100 %] 100 % (05/17 0355) ?Weight:  [78.9 kg] 78.9 kg (05/17 0355) ?General: Well-developed, hard of hearing, hemodynamically stable, no acute distress. ?HEENT: Normocephalic, atraumatic, no scleral icterus or xanthelasmas, no JVP, bilateral carotid bruits. ?Heart: Regular rate and rhythm, positive S1-S2, no murmurs rubs or gallops appreciated. ?Abdomen: Soft, nontender, nondistended, positive bowel sounds in all 4 quadrants, no ascites. ?Extremities: No peripheral edema, +2 DP and PT pulses. ?Neuro: Alert oriented x4, nonfocal, normal muscle tone. ?Psych: Normal mood and affect, normal behavior, cooperative. ?No significant change in physical examination. ? ?Lab Results: ?Hematology ?Recent Labs  ?Lab 03/23/22 ?3536 03/25/22 ?0409 03/26/22 ?0348  ?WBC 5.8 4.4 4.4  ?RBC 3.37* 3.19* 3.31*  ?HGB 10.0* 9.3* 9.6*  ?HCT 29.0* 27.3* 28.6*  ?MCV 86.1 85.6 86.4  ?MCH 29.7 29.2 29.0  ?MCHC 34.5 34.1 33.6  ?RDW 13.2 13.0 13.2  ?PLT 108* 102* 108*  ? ? ?Chemistry ?Recent  Labs  ?Lab 03/22/22 ?1200 03/23/22 ?1443 03/24/22 ?1540 03/25/22 ?0409 03/26/22 ?0348  ?NA 133* 133* 132* 133* 134*  ?K 4.4 3.6 3.3* 3.2* 4.0  ?CL 101 101 100 99 101  ?CO2 '23 24 24 26 25  '$ ?GLUCOSE 108* 96 191* 121* 102*  ?BUN 28* '20 17 22 '$ 30*  ?CREATININE 1.36* 1.21 1.21 1.34* 1.50*  ?CALCIUM 9.4 8.5* 8.2* 7.9* 8.0*  ?PROT 6.6 5.8*  --   --   --   ?ALBUMIN 3.9 3.2*  --   --   --   ?AST 21 18  --   --   --   ?ALT 30 22  --   --   --   ?ALKPHOS 82 71  --   --   --   ?BILITOT 0.9 1.2  --   --   --   ?GFRNONAA 52* 60* 60* 53* 46*  ?ANIONGAP '9 8 8 8 8  '$ ?  ? ?Cardiac Enzymes: ?Cardiac Panel (last 3 results) ?No results for input(s): CKTOTAL, CKMB, TROPONINIHS, RELINDX in the last 72 hours. ? ?BNP (last 3 results) ?Recent Labs  ?  12/20/21 ?1117 03/22/22 ?1305  ?BNP 2,724.1* 4,452.1*  ? ? ?ProBNP (last 3 results) ?Recent Labs  ?  12/30/21 ?1205 03/03/22 ?1151 03/14/22 ?1250  ?PROBNP 2,011* 4,258* 3,403*  ? ? ? ?DDimer No results for input(s): DDIMER in the last 168 hours.  ? ?Hemoglobin A1c:  ?Lab Results  ?Component Value Date  ? HGBA1C 6.2 (H) 02/19/2022  ? MPG 131.24 02/19/2022  ? ? ?TSH  ?Recent Labs  ?  03/03/22 ?1151  ?TSH 0.602  ? ? ?Lipid Panel  ?Lab Results  ?  Component Value Date  ? CHOL 125 03/23/2022  ? HDL 51 03/23/2022  ? [place] 61 03/23/2022  ? TRIG 66 03/23/2022  ? CHOLHDL 2.5 03/23/2022  ? ?Imaging: ?No results found. ? ?RADIOLOGY:  ?MRI brain without contrast: ?02/19/2022 ?1. No acute intracranial abnormality. ?2. Moderate chronic small vessel ischemic disease. ?3. 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 ?  ?MRA head without contrast: ?02/19/2022 ?1. Negative intracranial MRA for large vessel occlusion or other emergent finding. ?2. Mild intracranial atherosclerotic disease without hemodynamically significant or correctable stenosis. ?3. 4 mm saccular left PCOM aneurysm. This would likely be amendable to endovascular treatment. Consultation with neuro interventional radiology suggested. ?   ?CARDIAC DATABASE: ?EKG: ?03/03/22: Atrial fibrillation, 52 bpm, diffuse nonspecific T wave abnormality.  Compared to prior ECG A-fib is new. ?  ?03/20/2022: Sinus bradycardia, with complete heart block, IVCD. ?  ?03/22/2022: Normal sinus rhythm, 65 bpm, right axis, first-degree AV block, nonspecific IVCD, nonspecific T wave abnormality.  Compared to prior EKG complete heart block is resolved. ?  ?Echocardiogram: ?06/18/2021: Normal LV systolic function with visual EF 50-55%.  Mild LVH, grade 2 diastolic dysfunction.  Mildly dilated left atrium, mild MR, moderate TR, RVSP 49 mmHg, insignificant pericardial effusion, severe aortic stenosis.  Moderate aortic valve stenosis. Peak velocity 3.41ms, Peak Gradient 39.8 mmHg, Mean Gradient 27 mmHg, AVA 0.8cm?, DI 0.3.  ?  ?10/21/2021: LVEF 40%, moderately reduced LVEF, global hypokinesis, mild LVH, right ventricular systolic function mildly reduced, RV size normal, moderately dilated left atrium, mildly dilated right atrium, mild MR, mean gradient 30 mmHg, AVA per VTI 0.99 cm?, dimensional index 0.25 (severe AS), estimated RAP 15 mmHg. ? 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 ?  ?Stress Testing: ?Lexiscan Tetrofosmin Stress Test 12/24/2020: ?Nondiagnostic ECG stress. ?The left ventricle is dilated in stress images more than rest images, LV end-diastolic volume 1998mL. There is mild diaphragmatic attenuation artifact in the inferior wall. Superimposed on this there is a moderate-sized moderate decrease in counts in the basal inferior and inferolateral wall suggestive of ischemia. ?Gated SPECT imaging of the left ventricle was abnormal,  demonstrating hypokinesis of the mid inferolateral wall, mid inferior wall and basal inferolateral wall. Stress LV EF is mildly dysfunctional 48%.  ?No previous exam available for comparison. High risk study.  ?  ?Left heart catheterization 01/15/2021: ?LV: Normal LV systolic function.  EDP mildly elevated at 20 mmHg.  There is 22 mmHg peak to peak pressure gradient across the aortic valve.  Findings are consistent with mild aortic stenosis. ?Very mild coronary calcification and minimal luminal irregularity in the coronary vessels.  Right dominant circulation. ?Impression: No significant coronary disease angiography.  Coronary calcification is evident.  Normal LVEF and mild aortic stenosis. 50 mL contrast used. ?  ?Carotid artery duplex 12/19/2020: ?Minimal stenosis in the right internal carotid artery (1-15%). Doppler velocity suggests stenosis in the right Minimal stenosis in the left internal carotid artery (1-15%). Doppler velocity suggests stenosis in the left external carotid artery (<50%). ?Mild heterogeneous plaque noted bilaterally.  ?Antegrade right vertebral artery flow. Antegrade left vertebral artery flow. ?External carotid artery stenosis is probably source of bruit. Follow up study is appropriate if clinically indicated. ? ?  Renal artery duplex: ?03/23/2022. ?Right: Normal size right kidney. Normal right Resisitive Index. No evidence of right renal artery stenosis. RRV flow present.  Cyst(s) noted.  ?Left:  Normal size of left kidney. Abnormal left Resisitve Index. 1-59% stenosis of the left renal artery. LRV flow present. Cyst(s) noted- largest measuring 7.4 x 6.6 x 6.7 cm.  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 ? ?Scheduled Meds: ? apixaban  5 mg Oral BID  ? atorvastatin  10 mg Oral Daily  ? cloNIDine  0.2 mg Transdermal Weekly  ? clopidogrel  75 mg Oral Daily  ? dapagliflozin propanediol  10 mg Oral Daily  ? hydrALAZINE  100 mg Oral TID  ? isosorbide dinitrate  40 mg Oral  TID  ? melatonin  3 mg Oral QHS  ? metoprolol succinate  25 mg Oral Daily  ? pantoprazole  40 mg Oral QAC breakfast  ? polyethylene glycol  17 g Oral BID  ? sacubitril-valsartan  1 tablet Oral BID  ? senna  1 ta

## 2022-03-26 NOTE — Discharge Summary (Signed)
Family Medicine Teaching Service ?Hospital Discharge Summary ? ?Patient name: Nicholas Shelton Medical record number: [id number] ?Date of birth: [birth year] Age: 81 y.o. Gender: male ?Date of Admission: 03/22/2022  Date of Discharge: 03/26/2022 ?Admitting Physician: Lind Covert, MD ? ?Primary Care Provider: Christain Sacramento, MD ?Consultants: Cardiology, IR ? ?Indication for Hospitalization: Severe HTN ? ?Discharge Diagnoses/Problem List:  ?Principal Problem: ?  Hypertension ?  ? ?Disposition: Home  ? ?Discharge Condition: Stable ? ?Discharge Exam:  ? ?General: NAD, laying in bed, alert and responsive ?Neuro: No focal deficits ?Cardiovascular: RRR no m/r/g ?Respiratory: CTAB no w/r/c, no dyspnea ?Abdomen: soft, non-tender, non-distended ?Extremities: no lower extremity edema ? 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 Remained asymptomatic throughout hospital stay. Cardiology was consulted and patient was started on nitro drip. 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  A.m. cortisol was 17.0 on discharge was weaned off of nitro drip and discharged on clonidine patch 0.2 milligrams weekly, hydralazine 100 mg 3 times daily, Isordil 40 mg 3 times daily, Toprol-XL 25 mg daily, Entresto 97-103 twice daily, spironolactone 12.5 mg daily. ? 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 IV diureses performed and cardiology followed. Weight at discharge was 78.9 kg (on admission was 82.1 kg).  ? ?Left ICA anueyrsm- 93m saccular; incidental finding ?MRI and MRA brain on admission for admission w/ BRAO found incidental ICA aneurysm. 2-3% 5-year rupture risk, 7% lifetime rupture risk.  Would like endovascular repair - IR saw patient in the hospital and said once stable on antihypertensive regimen for a while to have  cardiology schedule for aneurysm treatment endovascularly. ? ?Paroxysmal Atrial Fibrillation ?Remained stable on Toprol-XL and continued on eliquis 5 mg BID ? ?Chronic bilateral infarcts; left retinal artery occlusion ?Completed 3 weeks DAPT, then supposed to continue clopidogrel alone indefinitely, per neurology. Clopidogrel 75 mg daily continued in hospital and aspirin stopped. ? ?Issues for Follow Up:  ?RUS showed SMA stenosis (patient was asymptomatic, monitor) ?Set up endovascular repair of L ICA aneurysm ?BMP to follow renal function (Cr 1.34>1.50 after farxiga started) ?Restart PO lasix outpatient as renal function allows ? ?Significant Procedures:  ? ?Significant Labs and Imaging:  ?Recent Labs  ?Lab 03/23/22 ?0382505/16/23 ?0409 03/26/22 ?0348  ?WBC 5.8 4.4 4.4  ?HGB 10.0* 9.3* 9.6*  ?HCT 29.0* 27.3* 28.6*  ?PLT 108* 102* 108*  ? ?Recent Labs  ?Lab 03/22/22 ?1200 03/23/22 ?0053905/15/23 ?0767305/16/23 ?0409 03/26/22 ?0348  ?NA 133* 133* 132* 133* 134*  ?K 4.4 3.6 3.3* 3.2* 4.0  ?CL 101 101 100 99 101  ?CO2 '23 24 24 26 25  '$ ?GLUCOSE 108* 96 191* 121* 102*  ?BUN 28* '20 17 22 '$ 30*  ?CREATININE 1.36* 1.21 1.21 1.34* 1.50*  ?CALCIUM 9.4 8.5* 8.2* 7.9* 8.0*  ?ALKPHOS 82 71  --   --   --   ?AST 21 18  --   --   --   ?ALT 30 22  --   --   --   ?ALBUMIN 3.9 3.2*  --   --   --   ? ?DG Chest 2 View ? ?Result Date: 03/22/2022 ?CLINICAL DATA:  Fatigue.  Uncontrolled hypertension. EXAM: CHEST - 2 VIEW COMPARISON:  03/03/2022 FINDINGS: 1340 hours. Low volume film. The cardio pericardial silhouette is enlarged. Status post TAVR. Basilar atelectasis  with possible trace right pleural effusion. Vascular congestion evident without overt airspace pulmonary edema. Bones are diffusely demineralized. IMPRESSION: Low volume film with basilar atelectasis and possible trace right pleural effusion. Electronically Signed   By: Misty Stanley M.D.   On: 03/22/2022 14:06  ? ?DG Chest 2 View ? ?Result Date: 03/04/2022 ?CLINICAL DATA:   81 year old male with amiodarone EXAM: CHEST - 2 VIEW COMPARISON:  12/20/2021 FINDINGS: Cardiomediastinal silhouette unchanged in size and contour. No evidence of central vascular congestion. No interlobular septal thickening. Interval placement of event recorder on the left chest wall, with interval TAVR. No pneumothorax or pleural effusion. Coarsened interstitial markings, with no confluent airspace disease. No acute displaced fracture. Degenerative changes of the spine. IMPRESSION: Negative for acute cardiopulmonary disease or significant fibrotic changes. Interval TAVR and placement of left chest wall cardiac event recorder Electronically Signed   By: Corrie Mckusick D.O.   On: 03/04/2022 09:29  ? ?MR ANGIO HEAD WO CONTRAST ? ?Result Date: 02/19/2022 ?CLINICAL DATA:  Initial evaluation for neuro deficit, stroke suspected, diplopia. EXAM: MRA HEAD WITHOUT CONTRAST TECHNIQUE: Angiographic images of the Circle of Willis were acquired using MRA technique without intravenous contrast. COMPARISON:  Comparison made with previous brain MRI from earlier the same day. FINDINGS: Anterior circulation: Visualized distal cervical segments of the internal carotid arteries are widely patent with antegrade flow. Petrous segments patent bilaterally. Mild atheromatous irregularity within the carotid siphons without hemodynamically significant stenosis. 4 mm saccular aneurysm extending laterally and posteriorly from the supraclinoid left ICA consistent with a PCOM aneurysm (series 5, image 117). Additional 2 mm outpouching extending superiorly from the supraclinoid left ICA felt to be most consistent with a vascular infundibulum related to the left ophthalmic artery. A1 segments patent bilaterally. Normal anterior communicating artery complex. Both ACAs mildly irregular but patent without significant stenosis. No M1 stenosis or occlusion. Normal left MCA bifurcation. Right MCA trifurcates. No proximal MCA branch occlusion. Distal MCA  branches well perfused and symmetric, although demonstrate small vessel atheromatous irregularity. Posterior circulation: Both V4 segments widely patent to the vertebrobasilar junction without stenosis. Both PICA patent at their origins. Basilar mildly irregular but patent to its distal aspect without stenosis. Superior cerebellar arteries patent bilaterally. Both PCAs primarily supplied via the basilar and are well perfused to their distal aspects without flow-limiting stenosis. Distal small vessel atheromatous irregularity. Anatomic variants: None significant. Other: None. IMPRESSION: 1. Negative intracranial MRA for large vessel occlusion or other emergent finding. 2. Mild intracranial atherosclerotic disease without hemodynamically significant or correctable stenosis. 3. 4 mm saccular left PCOM aneurysm. This would likely be amendable to endovascular treatment. Consultation with neuro interventional radiology suggested. Electronically Signed   By: Jeannine Boga M.D.   On: 02/19/2022 21:08  ? ?MR BRAIN WO CONTRAST ? ?Result Date: 02/19/2022 ?CLINICAL DATA:  Neuro deficit, acute, stroke suspected. New BRAO. Diplopia x 3 weeks. EXAM: MRI HEAD WITHOUT CONTRAST TECHNIQUE: Multiplanar, multiecho pulse sequences of the brain and surrounding structures were obtained without intravenous contrast. COMPARISON:  None. FINDINGS: Brain: There is no evidence of an acute infarct, intracranial hemorrhage, mass, midline shift, or extra-axial fluid collection. Patchy T2 hyperintensities in the cerebral white matter bilaterally are nonspecific but compatible with moderate chronic small vessel ischemic disease. A small chronic cortical infarct is noted in the right frontal lobe. There is mild cerebral atrophy. A mega cisterna magna is incidentally noted, a normal variant. There is a small chronic left cerebellar infarct. Vascular: Major intracranial vascular flow voids are preserved. Skull and  upper cervical spine:  Unremarkable bone marrow signal. Sinuses/Orbits: Unremarkable orbits. Paranasal sinuses and mastoid air cells are clear. Other: None. IMPRESSION: 1. No acute intracranial abnormality. 2. Moderate chronic small vessel ischemi

## 2022-03-27 LAB — METANEPHRINES, PLASMA
Metanephrine, Free: 67.5 pg/mL (ref 0.0–88.0)
Normetanephrine, Free: 123.5 pg/mL (ref 0.0–297.2)

## 2022-03-28 ENCOUNTER — Other Ambulatory Visit (HOSPITAL_COMMUNITY): Payer: Self-pay

## 2022-04-01 ENCOUNTER — Telehealth: Payer: Self-pay

## 2022-04-01 NOTE — Telephone Encounter (Signed)
Pt sister in law called due to pt bp. Pt bp this morning was 206/86 about 1:37 today his bp was 197/92. Pt sister in law is concern. Please advise. He has an appt with you 05/26, she wants to know should they go to the hospital or wait until his appt with you on Friday

## 2022-04-01 NOTE — Telephone Encounter (Signed)
Called pt and sister in law mention on Sunday 03/30/2022

## 2022-04-01 NOTE — Telephone Encounter (Signed)
FYI:  Patient daughter called and stated patient BP was 167/90 after taking his metamucil.  About 45 minutes his BP was 200/111 and she is worried that he "is reaching stroke country".   Patient denies chest pain, dizziness, SOB, and double vision.   She is going to call EMS to have them recheck  BP and assess him and see if they recommend he go to ED.

## 2022-04-01 NOTE — Telephone Encounter (Signed)
Ask them when did he place the clonidine patch?

## 2022-04-02 ENCOUNTER — Other Ambulatory Visit: Payer: Self-pay | Admitting: Cardiology

## 2022-04-02 DIAGNOSIS — I10 Essential (primary) hypertension: Secondary | ICD-10-CM

## 2022-04-02 MED ORDER — DOXAZOSIN MESYLATE 1 MG PO TABS
1.0000 mg | ORAL_TABLET | Freq: Every day | ORAL | 0 refills | Status: DC
Start: 2022-04-02 — End: 2022-04-18

## 2022-04-02 NOTE — Telephone Encounter (Signed)
See the other conversation link.   ST

## 2022-04-02 NOTE — Telephone Encounter (Signed)
Pts daughter aware, they will be picking up the new prescription.

## 2022-04-02 NOTE — Telephone Encounter (Signed)
Just spoke to [employer], 04/02/22 2:20 PM  Sent in a prescription for doxazosin.  If he has any focal neurological deficits, facial asymmetry, heaviness in arms or legs, he needs to call 911 and go to the hospital for further evaluation.  Continue keeping a blood pressure log.  Please remind the patient to bring the caridac medication bottles at the next office visit to help facilitate a more accurate medication reconciliation.  Yojan Paskett Waldron, DO, [facility]

## 2022-04-04 ENCOUNTER — Other Ambulatory Visit: Payer: Self-pay | Admitting: Cardiology

## 2022-04-04 ENCOUNTER — Encounter: Payer: Self-pay | Admitting: Cardiology

## 2022-04-04 ENCOUNTER — Ambulatory Visit: Payer: Medicare Other | Admitting: Cardiology

## 2022-04-04 VITALS — BP 199/85 | HR 58 | Temp 97.3°F | Resp 16 | Ht 67.0 in | Wt 186.6 lb

## 2022-04-04 DIAGNOSIS — E782 Mixed hyperlipidemia: Secondary | ICD-10-CM

## 2022-04-04 DIAGNOSIS — I350 Nonrheumatic aortic (valve) stenosis: Secondary | ICD-10-CM

## 2022-04-04 DIAGNOSIS — I10 Essential (primary) hypertension: Secondary | ICD-10-CM

## 2022-04-04 DIAGNOSIS — I459 Conduction disorder, unspecified: Secondary | ICD-10-CM

## 2022-04-04 DIAGNOSIS — I5022 Chronic systolic (congestive) heart failure: Secondary | ICD-10-CM

## 2022-04-04 DIAGNOSIS — Z8673 Personal history of transient ischemic attack (TIA), and cerebral infarction without residual deficits: Secondary | ICD-10-CM

## 2022-04-04 DIAGNOSIS — Z87891 Personal history of nicotine dependence: Secondary | ICD-10-CM

## 2022-04-04 DIAGNOSIS — E1165 Type 2 diabetes mellitus with hyperglycemia: Secondary | ICD-10-CM

## 2022-04-04 DIAGNOSIS — Z952 Presence of prosthetic heart valve: Secondary | ICD-10-CM

## 2022-04-04 DIAGNOSIS — I428 Other cardiomyopathies: Secondary | ICD-10-CM

## 2022-04-04 MED ORDER — CLONIDINE HCL 0.2 MG PO TABS: 0.2000 mg | ORAL_TABLET | Freq: Three times a day (TID) | ORAL | 0 refills | Status: DC

## 2022-04-04 NOTE — Progress Notes (Addendum)
IDZeke Shelton, DOB 10-17-1941, MRN [id number]  PCP:  Christain Sacramento, MD  Cardiologist:  Rex Kras, DO, [facility] (established care 12/13/2020) Cardiac Electrophysiologist: Dr. Virl Axe.   Date: 04/04/22 Last Office Visit: 03/03/2022  Chief Complaint  Patient presents with   Follow-up    Blood pressure and heart failure management    HPI  Nicholas Shelton is a 81 y.o. male whose past medical history and cardiovascular risk factors include: Hx of aortic stenosis s/p 43m Edward Sapien 3 Valve, heart failure with reduced EF, retinal artery branch occlusion, atrial fibrillation, saccular left PCOM aneurysm, hypertension, hyperlipidemia, non-insulin-dependent diabetes mellitus type 2, former smoker, advanced age.  His aortic stenosis was being managed medically with serial echocardiograms but once his LVEF reduced he underwent valve replacement workup and is now s/p 29 mm Edwards SAPIEN 3 valve implantation and doing well postprocedure.  Given the reduced LVEF he was weaned off of clonidine and his GDMT has been uptitrated. 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  During subsequent office visits he underwent an EKG which was concerning for atrial fibrillation and given the findings of retinal artery branch occlusions and right frontal and left cerebellar infarcts he was treated with AV nodal blocking agents for rate control and anticoagulation for thromboembolic prophylaxis.  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  Since then he came to the hospital due to elevated blood pressures at home.  Patient was placed on parenteral nitro drip and up titration of antihypertensive medications.  His blood pressure did improve during the hospitalization and he was  discharged home safely and now presents for follow-up.  He was recently discharged in May 2023 and he continues to report elevated blood pressures at home with SBP >1830mg.  Since then I started him on doxazosin and he started taking it yesterday 04/03/2022 and has noted improvement in blood pressure.  He denies anginal discomfort, heart failure symptoms, near-syncope or syncopal events.  ALLERGIES: Allergies  Allergen Reactions   Trazodone And Nefazodone Other (See Comments)    Sluggishness the next morning    MEDICATION LIST PRIOR TO VISIT: Current Meds  Medication Sig   acetaminophen (TYLENOL) 500 MG tablet Take 500 mg by mouth every 8 (eight) hours as needed for moderate pain.   apixaban (ELIQUIS) 5 MG TABS tablet Take 1 tablet (5 mg total) by mouth 2 (two) times daily.   atorvastatin (LIPITOR) 10 MG tablet Take 10 mg by mouth daily.   clopidogrel (PLAVIX) 75 MG tablet Take 1 tablet (75 mg total) by mouth daily.   dapagliflozin propanediol (FARXIGA) 10 MG TABS tablet Take 1 tablet (10 mg total) by mouth daily.   doxazosin (CARDURA) 1 MG tablet Take 1 tablet (1 mg total) by mouth daily.   hydrALAZINE (APRESOLINE) 100 MG tablet Take 1 tablet (100 mg total) by mouth 3 (three) times daily.   isosorbide dinitrate (ISORDIL) 20 MG tablet Take 2 tablets (40 mg total) by mouth 3 (three) times daily.   metFORMIN (GLUCOPHAGE) 1000 MG tablet Take 1 tablet (1,000 mg total) by mouth 2 (two) times daily with a meal.   metoprolol succinate (TOPROL-XL) 25 MG 24 hr tablet Take 1 tablet (25 mg total) by mouth daily.   Multiple Vitamin (  MULTI VITAMIN MENS PO) Take by mouth.   pantoprazole (PROTONIX) 40 MG tablet Take 40 mg by mouth daily before breakfast.   Psyllium 28.3 % POWD Take by mouth.   sacubitril-valsartan (ENTRESTO) 97-103 MG Take 1 tablet by mouth 2 (two) times daily.   spironolactone (ALDACTONE) 25 MG tablet Take 0.5 tablets (12.5 mg total) by mouth every morning.   [DISCONTINUED] cloNIDine  (CATAPRES - DOSED IN MG/24 HR) 0.2 mg/24hr patch Place 1 patch (0.2 mg total) onto the skin once a week.   [DISCONTINUED] cloNIDine (CATAPRES) 0.2 MG tablet Take 1 tablet (0.2 mg total) by mouth 3 (three) times daily.     PAST MEDICAL HISTORY: Past Medical History:  Diagnosis Date   Actinic keratosis    Arthritis    AV block    Bradycardia    Degeneration of lumbar or lumbosacral intervertebral disc    Deviated nasal septum    Diabetes mellitus without complication (HCC)    Gastroesophageal reflux disease without esophagitis    Generalized anxiety disorder    Gout with tophus    Hearing loss    Hyperlipidemia    Hypertension    Insomnia    Murmur    Aortic stenosis   Nasal turbinate hypertrophy    Osteoarthritis of wrist    PAC (premature atrial contraction)    Peptic ulcer    Peripheral neuropathy    PVC (premature ventricular contraction)    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 BCyndia Bent  Seasonal allergic rhinitis due to pollen     PAST SURGICAL HISTORY: Past Surgical History:  Procedure Laterality Date   COLONOSCOPY     ELBOW SURGERY     pinched nerve on both arms   INGUINAL HERNIA REPAIR Right 10/30/2018   Procedure: OPEN REPAIR OF INCARCERATED RIGHT INGUINAL HERNIA WITH MESH;  Surgeon: WGreer Pickerel MD;  [place] [facility]  Service: General;  Laterality: Right;   LEFT HEART CATH AND CORONARY ANGIOGRAPHY N/A 01/15/2021   Procedure: LEFT HEART CATH AND CORONARY ANGIOGRAPHY;  Surgeon: GAdrian Prows MD;  [facility];  Service: Cardiovascular;  Laterality: N/A;   TEE WITHOUT CARDIOVERSION N/A 12/24/2021   Procedure: TRANSESOPHAGEAL ECHOCARDIOGRAM (TEE);  Surgeon: CSherren Mocha MD;  [place] [facility]  Service: Open Heart Surgery;  Laterality: N/A;   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   Procedure: TOTAL KNEE ARTHROPLASTY;  Surgeon: CSydnee Cabal MD;  Location: [facility];   Service: Orthopedics;  Laterality: Right;  with adductor canal   WRIST SURGERY     pinched nerve    FAMILY HISTORY: The patient family history includes Heart attack in his mother.  SOCIAL HISTORY:  The patient  reports that he quit smoking about 27 years ago. His smoking use included cigarettes. He has a 7.50 pack-year smoking history. He has never used smokeless tobacco. He reports that he does not drink alcohol and does not use drugs.  REVIEW OF SYSTEMS: Review of Systems  Constitutional: Negative for chills and fever.  HENT:  Negative for hoarse voice and nosebleeds.        Difficulty hearing  Eyes:  Negative for discharge, double vision and pain.  Cardiovascular:  Negative for chest pain, claudication, dyspnea on exertion, leg swelling, near-syncope, orthopnea, palpitations, paroxysmal nocturnal dyspnea and syncope.  Respiratory:  Negative for hemoptysis and shortness of breath.   Musculoskeletal:  Negative for muscle cramps and myalgias.  Gastrointestinal:  Negative for  abdominal pain, constipation, diarrhea, hematemesis, hematochezia, melena, nausea and vomiting.  Neurological:  Negative for dizziness and light-headedness.   PHYSICAL EXAM:    04/04/2022   11:28 AM 03/26/2022    8:52 AM 03/26/2022    8:51 AM  Vitals with BMI  Height _0     Weight 186 lbs 10 oz    BMI 76.72    Systolic 094  709  Diastolic 85  49  Pulse 58 55    CONSTITUTIONAL: Well-developed and well-nourished. No acute distress.  SKIN: Skin is warm and dry. No rash noted. No cyanosis. No pallor. No jaundice HEAD: Normocephalic and atraumatic.  EYES: No scleral icterus MOUTH/THROAT: Moist oral membranes.  NECK: No JVD present. No thyromegaly noted. CHEST Normal respiratory effort. No intercostal retractions  LUNGS: Clear to auscultation bilaterally. No stridor. No wheezes. No rales.  CARDIOVASCULAR: Irregular, variable S1-S2, no murmurs, rubs or gallops appreciated. ABDOMINAL: Soft, nontender,  nondistended, positive bowel sounds in all 4 quadrants, no apparent ascites.  EXTREMITIES: No peripheral edema, 2+ DP and PT pulses. HEMATOLOGIC: No significant bruising NEUROLOGIC: Oriented to person, place, and time. Nonfocal. Normal muscle tone.  PSYCHIATRIC: Normal mood and affect. Normal behavior. Cooperative  RADIOLOGY:  MRI brain without contrast: 02/19/2022 1. No acute intracranial abnormality. 2. Moderate chronic small vessel ischemic disease. 3. 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  MRA head without contrast: 02/19/2022 1. Negative intracranial MRA for large vessel occlusion or other emergent finding. 2. Mild intracranial atherosclerotic disease without hemodynamically significant or correctable stenosis. 3. 4 mm saccular left PCOM aneurysm. This would likely be amendable to endovascular treatment. Consultation with neuro interventional radiology suggested.  CARDIAC DATABASE: EKG: 10/31/2021: Probable normal sinus rhythm cannot rule out ectopic atrial rhythm, IVCD suggestive of RBBB, diffuse nonspecific T wave abnormality.  No significant change compared to prior ECG.   01/29/2022: Sinus bradycardia, 48 bpm, first-degree AV block, occasional PVCs.    03/03/22: Atrial fibrillation, 52 bpm, diffuse nonspecific T wave abnormality.  Compared to prior ECG A-fib is new.  04/04/2022: Atrial tachycardia with escape junctional rhythm.  Echocardiogram: 06/18/2021:  Normal LV systolic function with visual EF 50-55%. Left ventricle cavity is normal in size. Mild left ventricular hypertrophy. Normal global wall motion. Doppler evidence of grade II (pseudonormal) diastolic dysfunction,  elevated LAP.  Left atrial cavity is mildly dilated.  Trileaflet aortic valve with no regurgitation. Severe aortic valve leaflet calcification. Moderate aortic valve stenosis. Peak velocity 3.31ms, Peak Gradient 39.8 mmHg, Mean Gradient 27 mmHg, AVA 0.8cm, DI 0.3.  Mild (Grade I)  mitral regurgitation.  Moderate tricuspid regurgitation. Mild pulmonary hypertension. RVSP measures 49 mmHg.  Insignificant pericardial effusion. There is no hemodynamic significance.  IVC is dilated with a respiratory response of >50%.  Compared to study 12/06/2020: LVEF was 55-60% and now 50-55%, AS remains moderate, RVSP was 345mg and now 4953m.  10/21/2021:  1. Compared to echo report from September 2022, LVEF is depressed and AS is severe.   2. Global longitudinal strain is -13.4%. Left ventricular ejection fraction, by estimation, is 40%. The left ventricle has moderately decreased function. The left ventricle demonstrates global hypokinesis. The left ventricular internal cavity size was mildly dilated. There is mild left ventricular hypertrophy.   3. Right ventricular systolic function is mildly reduced. The right ventricular size is normal.   4. Left atrial size was moderately dilated.   5. Right atrial size was mildly dilated.   6. Mild mitral valve regurgitation.   7. AV is thickened, calcified  with restricted motion motion. Peak and mean gradients through the valve are 48 and 30 mm Hg respectively. AVA (VTI) is 0.99cm2. Dimensionless index is 0.25 All consistent with severe  AS. Marland Kitchen Aortic valve regurgitation is not  visualized.   8. The inferior vena cava is dilated in size with <50% respiratory variability, suggesting right atrial pressure of 15 mmHg.  01/22/2022: LVEF 35-40%, global hypokinesis, mild LVH, grade 2 diastolic dysfunction, elevated LVEDP, RV size and function normal, severely elevated PASP, severely dilated left atrium, moderately dilated right atrium, mild MR, 21 9 mm SAPIEN 3 bioprosthetic aortic valve present, peak velocity 1.95 m/s, mean gradient 8.3 mmHg, AVA per VTI 1.97 cm, estimated RAP 8 mmHg.   Stress Testing: Lexiscan Tetrofosmin Stress Test 12/24/2020: Nondiagnostic ECG stress. The left ventricle is dilated in stress images more than rest images, LV  end-diastolic volume 681 mL. There is mild diaphragmatic attenuation artifact in the inferior wall. Superimposed on this there is a moderate-sized moderate decrease in counts in the basal inferior and inferolateral wall suggestive of ischemia. Gated SPECT imaging of the left ventricle was abnormal, demonstrating hypokinesis of the mid inferolateral wall, mid inferior wall and basal inferolateral wall. Stress LV EF is mildly dysfunctional 48%.  No previous exam available for comparison. High risk study.   Left heart catheterization 01/15/2021: LV: Normal LV systolic function.  EDP mildly elevated at 20 mmHg.  There is 22 mmHg peak to peak pressure gradient across the aortic valve.  Findings are consistent with mild aortic stenosis. Very mild coronary calcification and minimal luminal irregularity in the coronary vessels.  Right dominant circulation. Impression: No significant coronary disease angiography.  Coronary calcification is evident.  Normal LVEF and mild aortic stenosis. 50 mL contrast used.  Carotid artery duplex 12/19/2020: Minimal stenosis in the right internal carotid artery (1-15%). Doppler velocity suggests stenosis in the right Minimal stenosis in the left internal carotid artery (1-15%). Doppler velocity suggests stenosis in the left external carotid artery (<50%). Mild heterogeneous plaque noted bilaterally.  Antegrade right vertebral artery flow. Antegrade left vertebral artery flow. External carotid artery stenosis is probably source of bruit. Follow up study is appropriate if clinically indicated.  Renal duplex: 03/23/2022 Renal:  Right: Normal size right kidney. Normal right Resisitive Index. No evidence of right renal artery stenosis. RRV flow present.         Cyst(s) noted.  Left:  Normal size of left kidney. Abnormal left Resisitve Index. 1-59% stenosis of the left renal artery. LRV flow present.         Cyst(s) noted- largest measuring 7.4 x 6.6 x 6.7 cm.  Mesenteric:  70  to 99% stenosis in the superior mesenteric artery.   LABORATORY DATA: External Labs: Collected: 11/29/2020 Creatinine 0.88 mg/dL. eGFR: 86 mL/min per 1.73 m Lipid profile: Total cholesterol 186, triglycerides 131, HDL 39, LDL 116, non HDL 147 Hemoglobin A1c: 7  External Labs: Collected: 04/03/2022 at [facility]. Hemoglobin 9.7 g/dL, hematocrit 30% Sodium 135, potassium four 5.2, chloride 104, bicarb 24, eGFR 57   LABORATORY DATA:    Latest Ref Rng & Units 03/26/2022    3:48 AM 03/25/2022    4:09 AM 03/23/2022    4:42 AM  CBC  WBC 4.0 - 10.5 K/uL 4.4   4.4   5.8    Hemoglobin 13.0 - 17.0 g/dL 9.6   9.3   10.0    Hematocrit 39.0 - 52.0 % 28.6   27.3   29.0  Platelets 150 - 400 K/uL 108   102   108         Latest Ref Rng & Units 03/26/2022    3:48 AM 03/25/2022    4:09 AM 03/24/2022    9:55 AM  CMP  Glucose 70 - 99 mg/dL 102   121   191    BUN 8 - 23 mg/dL _0 Creatinine 0.61 - 1.24 mg/dL 1.50   1.34   1.21    Sodium 135 - 145 mmol/L 134   133   132    Potassium 3.5 - 5.1 mmol/L 4.0   3.2   3.3    Chloride 98 - 111 mmol/L 101   99   100    CO2 22 - 32 mmol/L _1 Calcium 8.9 - 10.3 mg/dL 8.0   7.9   8.2      Lipid Panel  Lab Results  Component Value Date   CHOL 125 03/23/2022   HDL 51 03/23/2022   LDLCALC 61 03/23/2022   TRIG 66 03/23/2022   CHOLHDL 2.5 03/23/2022     No components found for: NTPROBNP Recent Labs    11/12/21 0937 12/30/21 1205 03/03/22 1151 03/14/22 1250  PROBNP 1,918* 2,011* 4,258* 3,403*   Recent Labs    03/03/22 1151 03/24/22 1419  TSH 0.602 0.532    BMP Recent Labs    03/24/22 0955 03/25/22 0409 03/26/22 0348  NA 132* 133* 134*  K 3.3* 3.2* 4.0  CL 100 99 101  CO2 _2 GLUCOSE 191* 121* 102*  BUN 17 22 30*  CREATININE 1.21 1.34* 1.50*  CALCIUM 8.2* 7.9* 8.0*  GFRNONAA 60* 53* 46*    HEMOGLOBIN A1C Lab Results  Component Value Date   HGBA1C 6.2  (H) 02/19/2022   MPG 131.24 02/19/2022    Cardiac Panel (last 3 results) No results for input(s): CKTOTAL, CKMB, TROPONINIHS, RELINDX in the last 72 hours.  CHOLESTEROL Recent Labs    02/19/22 1836 03/23/22 0443  CHOL 255* 125    Hepatic Function Panel Recent Labs    03/03/22 1151 03/22/22 1200 03/23/22 0442  PROT 6.8 6.6 5.8*  ALBUMIN 4.1 3.9 3.2*  AST _3 ALT _4 ALKPHOS 110 82 71  BILITOT 0.4 0.9 1.2     IMPRESSION:    ICD-10-CM   1. Chronic HFrEF (heart failure with reduced ejection fraction) (HCC)  I50.22 EKG 12-Lead    Ambulatory referral to Sleep Studies    2. Nonischemic cardiomyopathy (HCC)  I42.8     3. Resistant hypertension  I10 Ambulatory referral to Sleep Studies    DISCONTINUED: cloNIDine (CATAPRES) 0.2 MG tablet    4. Conduction disorder, unspecified  I45.9     5. History of stroke  Z86.73     6. Severe aortic stenosis  I35.0     7. S/P TAVR (transcatheter aortic valve replacement)  Z95.2     8. Mixed hyperlipidemia  E78.2     9. Type 2 diabetes mellitus with hyperglycemia, without long-term current use of insulin (HCC)  E11.65     10. Former smoker  Z87.891       RECOMMENDATIONS: Nicholas Shelton is a 81 y.o. male whose past medical history and cardiac risk factors include: Hx of aortic stenosis s/p 31m Edward Sapien 3 Valve, heart failure with reduced EF, retinal artery branch occlusion, atrial fibrillation,  saccular left PCOM aneurysm, hypertension, hyperlipidemia, non-insulin-dependent diabetes mellitus type 2, former smoker, advanced age.  Chronic HFrEF (heart failure with reduced ejection fraction) (HCC) / Nonischemic cardiomyopathy (HCC) Stage C, NYHA class II/III.  Medications reconciled. Strict I's and O's, daily weights, reemphasized importance of low-salt diet. We will uptitrate antihypertensive medications. We will order sleep study to evaluate for sleep apnea.  Resistant hypertension Patient blood pressure remains  elevated despite being on multiple antihypertensive medications.   Initially patient was on clonidine 0.6 mg p.o. twice daily. We will discontinue clonidine patch 0.2 mg weekly and transition him to clonidine 0.2 mg p.o. 3 times daily.  Patient is asked to stop Cardura once he starts clonidine. We will arrange for sleep study as discussed above. Renal duplex suggestive of left renal artery stenosis less than 60% (monitor for now).  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 If his BP remains greater than 190mmHg after starting clonidine or has focal neurological deficits he is asked to go to ED for further evaluation and management. He and his sister in law verbalized understanding.  Conduction disorder: Today's EKG illustrates atrial tachycardia with escape junctional rhythm. Re-reviewed his EKGs  as well as a recent Zio patch.  Patient's Zio patch illustrates atrial rates at times to be consistent with atrial flutter.  However, given his history of bilateral chronic infarcts and retinal artery branch occlusion I have also reached out to our EP colleagues for further assistance. Patient is asked to be cognizant for symptoms of lightheaded, dizziness, near-syncope or syncopal events.  If such symptoms arise he is asked to go to the ER via EMS for further evaluation and management. I suspect his underlying conduction disease is contributing to his uncontrolled hypertension as well. Further recommendations to follow.  Severe aortic stenosis / S/P TAVR (transcatheter aortic valve replacement) Echocardiogram from March 2023 reviewed and noted above for further reference. Monitor for now  Mixed hyperlipidemia Improving. LDL 165 mg/dL April 2023. LDL 61 mg/dL May 2023 Continue current medical therapy. No changes warranted at this time.  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 Asymptomatic.  Monitor for now.  Patient has been evaluated by interventional radiology given the incidental 4 mm supraclinoid left ICA, communicating segment aneurysm.  Patient is considering endovascular intervention.  However, if it is considered to be elective procedure and can safely be postponed would recommend better blood pressure management and rhythm assessment prior to proceeding forward.  Recommendations conveyed to the patient and sister-in-law.  Total time spent: 55 minutes.  Reviewed the records of recent hospitalization, imaging studies, EKG ordered and independently reviewed with colleagues, and reached out to the cardiac electrophysiology.  Plan of care discussed with the patient and his sister-in-law was present as part of today's office visit and provided collateral history.  FINAL MEDICATION LIST END OF ENCOUNTER: Meds ordered this encounter  Medications   DISCONTD: cloNIDine (CATAPRES) 0.2 MG tablet    Sig: Take 1 tablet (0.2 mg total) by mouth 3 (three) times daily.    Dispense:  90 tablet    Refill:  0     Current Outpatient Medications:    acetaminophen (TYLENOL) 500 MG tablet, Take 500 mg by mouth every 8 (eight) hours as needed for moderate pain., Disp: , Rfl:    apixaban (ELIQUIS) 5 MG TABS tablet, Take 1 tablet (5 mg total) by mouth 2 (two) times daily.,  Disp: 180 tablet, Rfl: 0   atorvastatin (LIPITOR) 10 MG tablet, Take 10 mg by mouth daily., Disp: , Rfl:    clopidogrel (PLAVIX) 75 MG tablet, Take 1 tablet (75 mg total) by mouth daily., Disp: 30 tablet, Rfl: 1   dapagliflozin propanediol (FARXIGA) 10 MG TABS tablet, Take 1 tablet (10 mg total) by mouth daily., Disp: 30 tablet, Rfl: 1   doxazosin (CARDURA) 1 MG tablet, Take 1 tablet (1 mg total) by mouth daily., Disp: 30 tablet, Rfl: 0   hydrALAZINE (APRESOLINE) 100 MG tablet, Take 1 tablet (100 mg total) by mouth 3 (three) times daily., Disp: 270 tablet, Rfl: 2    isosorbide dinitrate (ISORDIL) 20 MG tablet, Take 2 tablets (40 mg total) by mouth 3 (three) times daily., Disp: 180 tablet, Rfl: 1   metFORMIN (GLUCOPHAGE) 1000 MG tablet, Take 1 tablet (1,000 mg total) by mouth 2 (two) times daily with a meal., Disp: , Rfl:    metoprolol succinate (TOPROL-XL) 25 MG 24 hr tablet, Take 1 tablet (25 mg total) by mouth daily., Disp: 30 tablet, Rfl: 1   Multiple Vitamin (MULTI VITAMIN MENS PO), Take by mouth., Disp: , Rfl:    pantoprazole (PROTONIX) 40 MG tablet, Take 40 mg by mouth daily before breakfast., Disp: , Rfl:    Psyllium 28.3 % POWD, Take by mouth., Disp: , Rfl:    sacubitril-valsartan (ENTRESTO) 97-103 MG, Take 1 tablet by mouth 2 (two) times daily., Disp: 60 tablet, Rfl: 1   spironolactone (ALDACTONE) 25 MG tablet, Take 0.5 tablets (12.5 mg total) by mouth every morning., Disp: 45 tablet, Rfl: 0   cloNIDine (CATAPRES) 0.2 MG tablet, TAKE 1 TABLET(0.2 MG) BY MOUTH THREE TIMES DAILY, Disp: 270 tablet, Rfl: 0  Orders Placed This Encounter  Procedures   Ambulatory referral to Sleep Studies   EKG 12-Lead   There are no Patient Instructions on file for this visit.   --Continue cardiac medications as reconciled in final medication list. --Return in about 2 weeks (around 04/18/2022) for Follow up, BP. Or sooner if needed. --Continue follow-up with your primary care physician regarding the management of your other chronic comorbid conditions.  Patient's questions and concerns were addressed to his satisfaction. He voices understanding of the instructions provided during this encounter.   This note was created using a voice recognition software as a result there may be grammatical errors inadvertently enclosed that do not reflect the nature of this encounter. Every attempt is made to correct such errors.  Rex Kras, [place], [facility]  Pager: [phone] Office: [phone]

## 2022-04-08 ENCOUNTER — Telehealth (HOSPITAL_COMMUNITY): Payer: Self-pay

## 2022-04-08 NOTE — Telephone Encounter (Signed)
Pt will call to schedule treatment with Dr. Debbrah Alar once he has his BP under control. AW

## 2022-04-08 NOTE — Telephone Encounter (Signed)
Called pt to ask him how his bp been. Pt mention that his  Bp was 133/67 yesterday morning. Pt bp today is 170/77 pulse 62. Pt mention he started his clonidine yesterday and stopped the patches 2 days ago.

## 2022-04-09 NOTE — Telephone Encounter (Signed)
Please encourage him to check his BP regularly.  I spoke to Dr. Quentin Ore from cardiac electrophysiology who will be arranging an office visit to see him regarding his heart rate.   Dr. Terri Skains

## 2022-04-09 NOTE — Telephone Encounter (Signed)
Pt is aware.  

## 2022-04-18 ENCOUNTER — Encounter: Payer: Self-pay | Admitting: Cardiology

## 2022-04-18 ENCOUNTER — Ambulatory Visit: Payer: Medicare Other | Admitting: Cardiology

## 2022-04-18 VITALS — BP 198/84 | HR 59 | Temp 97.9°F | Resp 16 | Ht 67.0 in | Wt 195.6 lb

## 2022-04-18 DIAGNOSIS — I5022 Chronic systolic (congestive) heart failure: Secondary | ICD-10-CM

## 2022-04-18 DIAGNOSIS — Z952 Presence of prosthetic heart valve: Secondary | ICD-10-CM

## 2022-04-18 DIAGNOSIS — Z8673 Personal history of transient ischemic attack (TIA), and cerebral infarction without residual deficits: Secondary | ICD-10-CM

## 2022-04-18 DIAGNOSIS — I350 Nonrheumatic aortic (valve) stenosis: Secondary | ICD-10-CM

## 2022-04-18 DIAGNOSIS — E782 Mixed hyperlipidemia: Secondary | ICD-10-CM

## 2022-04-18 DIAGNOSIS — I10 Essential (primary) hypertension: Secondary | ICD-10-CM

## 2022-04-18 DIAGNOSIS — I428 Other cardiomyopathies: Secondary | ICD-10-CM

## 2022-04-18 DIAGNOSIS — E1165 Type 2 diabetes mellitus with hyperglycemia: Secondary | ICD-10-CM

## 2022-04-18 DIAGNOSIS — I459 Conduction disorder, unspecified: Secondary | ICD-10-CM

## 2022-04-18 DIAGNOSIS — Z87891 Personal history of nicotine dependence: Secondary | ICD-10-CM

## 2022-04-18 NOTE — Progress Notes (Signed)
Nicholas Shelton, DOB 1941-09-18, MRN [id number]  PCP:  Barbie Banner, MD  Cardiologist:  Tessa Lerner, DO, [facility] (established care 12/13/2020) Cardiac Electrophysiologist: Dr. Sherryl Manges.   Date: 04/18/22 Last Office Visit: 04/04/2022  Chief Complaint  Patient presents with   Hypertension   Follow-up    HPI  Nicholas Shelton is a 81 y.o. male whose past medical history and cardiovascular risk factors include: Hx of aortic stenosis s/p 22mm Edward Sapien 3 Valve, heart failure with reduced EF, retinal artery branch occlusion, paroxysmal atrial fibrillation/flutter, saccular left PCOM aneurysm, hypertension, hyperlipidemia, non-insulin-dependent diabetes mellitus type 2, former smoker, advanced age.  His aortic stenosis was being managed medically with serial echocardiograms but once his LVEF reduced he underwent 29 mm Edwards SAPIEN 3 valve implantation and did well postprocedure.  Given the reduced LVEF he was weaned off of clonidine and his GDMT has been uptitrated. 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  During subsequent office visits he underwent an EKG which was concerning for atrial flutter and given the findings of retinal artery branch occlusions and right frontal and left cerebellar infarcts he was started on AV nodal blocking agents, amiodarone, and anticoagulation for thromboembolic prophylaxis. Upon follow-up he was noted to be in complete heart block and amiodarone was discontinued and metoprolol was reduced held.  After that he was hospitalized for uncontrolled hypertension and was placed on parenteral medications while the oral medications were uptitrated.  Upon follow-up the patient's blood pressure continued to be not well controlled and therefore the shared decision was to transition him  from transdermal clonidine to oral clonidine.  He now presents for follow-up.  Patient states that his home blood pressures are now better but still not at goal.  Systolic blood pressures are between 130-170 mmHg.  ALLERGIES: Allergies  Allergen Reactions   Trazodone And Nefazodone Other (See Comments)    Sluggishness the next morning    MEDICATION LIST PRIOR TO VISIT: Current Meds  Medication Sig   acetaminophen (TYLENOL) 500 MG tablet Take 500 mg by mouth every 8 (eight) hours as needed for moderate pain.   apixaban (ELIQUIS) 5 MG TABS tablet Take 1 tablet (5 mg total) by mouth 2 (two) times daily.   atorvastatin (LIPITOR) 10 MG tablet Take 10 mg by mouth daily.   cloNIDine (CATAPRES) 0.2 MG tablet TAKE 1 TABLET(0.2 MG) BY MOUTH THREE TIMES DAILY   clopidogrel (PLAVIX) 75 MG tablet Take 1 tablet (75 mg total) by mouth daily.   dapagliflozin propanediol (FARXIGA) 10 MG TABS tablet Take 1 tablet (10 mg total) by mouth daily.   hydrALAZINE (APRESOLINE) 100 MG tablet Take 1 tablet (100 mg total) by mouth 3 (three) times daily.   isosorbide dinitrate (ISORDIL) 20 MG tablet Take 2 tablets (40 mg total) by mouth 3 (three) times daily.   metFORMIN (GLUCOPHAGE) 1000 MG tablet Take 1 tablet (1,000 mg total) by mouth 2 (two) times daily with a meal.   metoprolol succinate (TOPROL-XL) 25 MG 24 hr tablet Take 1 tablet (25 mg total) by mouth daily.   Multiple Vitamin (MULTI VITAMIN MENS PO) Take by mouth.   pantoprazole (PROTONIX) 40 MG tablet Take 40 mg by mouth daily before breakfast.   Psyllium 28.3 % POWD Take by mouth.   sacubitril-valsartan (ENTRESTO) 97-103 MG Take  1 tablet by mouth 2 (two) times daily.   spironolactone (ALDACTONE) 25 MG tablet Take 0.5 tablets (12.5 mg total) by mouth every morning.     PAST MEDICAL HISTORY: Past Medical History:  Diagnosis Date   Actinic keratosis    Arthritis    AV block    Bradycardia    Degeneration of lumbar or lumbosacral intervertebral disc     Deviated nasal septum    Diabetes mellitus without complication (HCC)    Gastroesophageal reflux disease without esophagitis    Generalized anxiety disorder    Gout with tophus    Hearing loss    Hyperlipidemia    Hypertension    Insomnia    Murmur    Aortic stenosis   Nasal turbinate hypertrophy    Osteoarthritis of wrist    PAC (premature atrial contraction)    Peptic ulcer    Peripheral neuropathy    PVC (premature ventricular contraction)    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 Laneta Simmers   Seasonal allergic rhinitis due to pollen     PAST SURGICAL HISTORY: Past Surgical History:  Procedure Laterality Date   COLONOSCOPY     ELBOW SURGERY     pinched nerve on both arms   INGUINAL HERNIA REPAIR Right 10/30/2018   Procedure: OPEN REPAIR OF INCARCERATED RIGHT INGUINAL HERNIA WITH MESH;  Surgeon: Gaynelle Adu, MD;  Location: [facility] OR;  Service: General;  Laterality: Right;   LEFT HEART CATH AND CORONARY ANGIOGRAPHY N/A 01/15/2021   Procedure: LEFT HEART CATH AND CORONARY ANGIOGRAPHY;  Surgeon: Yates Decamp, MD;  Location: [facility] INVASIVE CV LAB;  Service: Cardiovascular;  Laterality: N/A;   TEE WITHOUT CARDIOVERSION N/A 12/24/2021   Procedure: TRANSESOPHAGEAL ECHOCARDIOGRAM (TEE);  Surgeon: Tonny Bollman, MD;  Location: [facility] OR;  Service: Open Heart Surgery;  Laterality: N/A;   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   Procedure: TOTAL KNEE ARTHROPLASTY;  Surgeon: Eugenia Mcalpine, MD;  Location: [facility] ORS;  Service: Orthopedics;  Laterality: Right;  with adductor canal   WRIST SURGERY     pinched nerve    FAMILY HISTORY: The patient family history includes Heart attack in his mother.  SOCIAL HISTORY:  The patient  reports that he quit smoking about 27 years ago. His smoking use included cigarettes. He has a 7.50 pack-year smoking history. He has never used smokeless tobacco. He reports that he does not drink  alcohol and does not use drugs.  REVIEW OF SYSTEMS: Review of Systems  Constitutional: Positive for malaise/fatigue.  Cardiovascular:  Negative for chest pain, dyspnea on exertion, leg swelling, near-syncope, orthopnea, palpitations, paroxysmal nocturnal dyspnea and syncope.  Respiratory:  Negative for shortness of breath.   Neurological:  Negative for dizziness, focal weakness and light-headedness.    PHYSICAL EXAM:    04/18/2022   10:26 AM 04/04/2022   11:28 AM 03/26/2022    8:52 AM  Vitals with BMI  Height 5\' 7"  5\' 7"    Weight 195 lbs 10 oz 186 lbs 10 oz   BMI 30.63 29.22   Systolic 198 199   Diastolic 84 85   Pulse 59 58 55   CONSTITUTIONAL: Well-developed and well-nourished. No acute distress.  SKIN: Skin is warm and dry. No rash noted. No cyanosis. No pallor. No jaundice HEAD: Normocephalic and atraumatic.  EYES: No scleral icterus MOUTH/THROAT: Moist oral membranes.  NECK: No JVD present. No thyromegaly noted. CHEST Normal respiratory effort. No intercostal retractions  LUNGS: Clear to auscultation bilaterally. No stridor. No wheezes. No rales.  CARDIOVASCULAR: Irregular, variable S1-S2, no murmurs, rubs or gallops appreciated. ABDOMINAL: Soft, nontender, nondistended, positive bowel sounds in all 4 quadrants, no apparent ascites.  EXTREMITIES: No peripheral edema, 2+ DP and PT pulses. HEMATOLOGIC: No significant bruising NEUROLOGIC: Oriented to person, place, and time. Nonfocal. Normal muscle tone.  PSYCHIATRIC: Normal mood and affect. Normal behavior. Cooperative  RADIOLOGY:  MRI brain without contrast: 02/19/2022 1. No acute intracranial abnormality. 2. Moderate chronic small vessel ischemic disease. 3. 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  MRA head without contrast: 02/19/2022 1. Negative intracranial MRA for large vessel occlusion or other emergent finding. 2. Mild intracranial atherosclerotic disease without hemodynamically significant or  correctable stenosis. 3. 4 mm saccular left PCOM aneurysm. This would likely be amendable to endovascular treatment. Consultation with neuro interventional radiology suggested.  CARDIAC DATABASE: EKG: 10/31/2021: Probable normal sinus rhythm cannot rule out ectopic atrial rhythm, IVCD suggestive of RBBB, diffuse nonspecific T wave abnormality.  No significant change compared to prior ECG.   01/29/2022: Sinus bradycardia, 48 bpm, first-degree AV block, occasional PVCs.    03/03/22: Atrial fibrillation, 52 bpm, diffuse nonspecific T wave abnormality.  Compared to prior ECG A-fib is new.  04/04/2022: Atrial tachycardia with escape junctional rhythm.  Echocardiogram: 06/18/2021:  Normal LV systolic function with visual EF 50-55%. Left ventricle cavity is normal in size. Mild left ventricular hypertrophy. Normal global wall motion. Doppler evidence of grade II (pseudonormal) diastolic dysfunction,  elevated LAP.  Left atrial cavity is mildly dilated.  Trileaflet aortic valve with no regurgitation. Severe aortic valve leaflet calcification. Moderate aortic valve stenosis. Peak velocity 3.85m/s, Peak Gradient 39.8 mmHg, Mean Gradient 27 mmHg, AVA 0.8cm, DI 0.3.  Mild (Grade I) mitral regurgitation.  Moderate tricuspid regurgitation. Mild pulmonary hypertension. RVSP measures 49 mmHg.  Insignificant pericardial effusion. There is no hemodynamic significance.  IVC is dilated with a respiratory response of >50%.  Compared to study 12/06/2020: LVEF was 55-60% and now 50-55%, AS remains moderate, RVSP was 75mmHg and now 62mmHg.  10/21/2021:  1. Compared to echo report from September 2022, LVEF is depressed and AS is severe.   2. Global longitudinal strain is -13.4%. Left ventricular ejection fraction, by estimation, is 40%. The left ventricle has moderately decreased function. The left ventricle demonstrates global hypokinesis. The left ventricular internal cavity size was mildly dilated. There is mild  left ventricular hypertrophy.   3. Right ventricular systolic function is mildly reduced. The right ventricular size is normal.   4. Left atrial size was moderately dilated.   5. Right atrial size was mildly dilated.   6. Mild mitral valve regurgitation.   7. AV is thickened, calcified with restricted motion motion. Peak and mean gradients through the valve are 48 and 30 mm Hg respectively. AVA (VTI) is 0.99cm2. Dimensionless index is 0.25 All consistent with severe  AS. Marland Kitchen Aortic valve regurgitation is not  visualized.   8. The inferior vena cava is dilated in size with <50% respiratory variability, suggesting right atrial pressure of 15 mmHg.  01/22/2022: LVEF 35-40%, global hypokinesis, mild LVH, grade 2 diastolic dysfunction, elevated LVEDP, RV size and function normal, severely elevated PASP, severely dilated left atrium, moderately dilated right atrium, mild MR, 21 9 mm SAPIEN 3 bioprosthetic aortic valve present, peak velocity 1.95 m/s, mean gradient 8.3 mmHg, AVA per VTI 1.97 cm, estimated RAP 8 mmHg.   Stress Testing: Lexiscan Tetrofosmin Stress Test 12/24/2020: Nondiagnostic ECG stress. The left  ventricle is dilated in stress images more than rest images, LV end-diastolic volume 888 mL. There is mild diaphragmatic attenuation artifact in the inferior wall. Superimposed on this there is a moderate-sized moderate decrease in counts in the basal inferior and inferolateral wall suggestive of ischemia. Gated SPECT imaging of the left ventricle was abnormal, demonstrating hypokinesis of the mid inferolateral wall, mid inferior wall and basal inferolateral wall. Stress LV EF is mildly dysfunctional 48%.  No previous exam available for comparison. High risk study.   Left heart catheterization 01/15/2021: LV: Normal LV systolic function.  EDP mildly elevated at 20 mmHg.  There is 22 mmHg peak to peak pressure gradient across the aortic valve.  Findings are consistent with mild aortic stenosis. Very  mild coronary calcification and minimal luminal irregularity in the coronary vessels.  Right dominant circulation. Impression: No significant coronary disease angiography.  Coronary calcification is evident.  Normal LVEF and mild aortic stenosis. 50 mL contrast used.  Carotid artery duplex 12/19/2020: Minimal stenosis in the right internal carotid artery (1-15%). Doppler velocity suggests stenosis in the right Minimal stenosis in the left internal carotid artery (1-15%). Doppler velocity suggests stenosis in the left external carotid artery (<50%). Mild heterogeneous plaque noted bilaterally.  Antegrade right vertebral artery flow. Antegrade left vertebral artery flow. External carotid artery stenosis is probably source of bruit. Follow up study is appropriate if clinically indicated.  Renal duplex: 03/23/2022 Renal:  Right: Normal size right kidney. Normal right Resisitive Index. No evidence of right renal artery stenosis. RRV flow present.         Cyst(s) noted.  Left:  Normal size of left kidney. Abnormal left Resisitve Index. 1-59% stenosis of the left renal artery. LRV flow present.         Cyst(s) noted- largest measuring 7.4 x 6.6 x 6.7 cm.  Mesenteric:  70 to 99% stenosis in the superior mesenteric artery.   LABORATORY DATA: External Labs: Collected: 11/29/2020 Creatinine 0.88 mg/dL. eGFR: 86 mL/min per 1.73 m Lipid profile: Total cholesterol 186, triglycerides 131, HDL 39, LDL 116, non HDL 147 Hemoglobin A1c: 7  External Labs: Collected: 04/03/2022 at [facility]. Hemoglobin 9.7 g/dL, hematocrit 30% Sodium 135, potassium four 5.2, chloride 104, bicarb 24, eGFR 57   LABORATORY DATA:    Latest Ref Rng & Units 03/26/2022    3:48 AM 03/25/2022    4:09 AM 03/23/2022    4:42 AM  CBC  WBC 4.0 - 10.5 K/uL 4.4  4.4  5.8   Hemoglobin 13.0 - 17.0 g/dL 9.6  9.3  10.0   Hematocrit 39.0 - 52.0 % 28.6  27.3  29.0   Platelets 150 - 400 K/uL 108   102  108        Latest Ref Rng & Units 03/26/2022    3:48 AM 03/25/2022    4:09 AM 03/24/2022    9:55 AM  CMP  Glucose 70 - 99 mg/dL 102  121  191   BUN 8 - 23 mg/dL $Remove'30  22  17   'TtCVSuQ$ Creatinine 0.61 - 1.24 mg/dL 1.50  1.34  1.21   Sodium 135 - 145 mmol/L 134  133  132   Potassium 3.5 - 5.1 mmol/L 4.0  3.2  3.3   Chloride 98 - 111 mmol/L 101  99  100   CO2 22 - 32 mmol/L $RemoveB'25  26  24   'gHuSHmhD$ Calcium 8.9 - 10.3 mg/dL 8.0  7.9  8.2  Lipid Panel  Lab Results  Component Value Date   CHOL 125 03/23/2022   HDL 51 03/23/2022   LDLCALC 61 03/23/2022   TRIG 66 03/23/2022   CHOLHDL 2.5 03/23/2022     No components found for: "NTPROBNP" Recent Labs    11/12/21 0937 12/30/21 1205 03/03/22 1151 03/14/22 1250  PROBNP 1,918* 2,011* 4,258* 3,403*   Recent Labs    03/03/22 1151 03/24/22 1419  TSH 0.602 0.532    BMP Recent Labs    03/24/22 0955 03/25/22 0409 03/26/22 0348  NA 132* 133* 134*  K 3.3* 3.2* 4.0  CL 100 99 101  CO2 $Re'24 26 25  'jCU$ GLUCOSE 191* 121* 102*  BUN 17 22 30*  CREATININE 1.21 1.34* 1.50*  CALCIUM 8.2* 7.9* 8.0*  GFRNONAA 60* 53* 46*    HEMOGLOBIN A1C Lab Results  Component Value Date   HGBA1C 6.2 (H) 02/19/2022   MPG 131.24 02/19/2022    Cardiac Panel (last 3 results) No results for input(s): "CKTOTAL", "CKMB", "TROPONINIHS", "RELINDX" in the last 72 hours.  CHOLESTEROL Recent Labs    02/19/22 1836 03/23/22 0443  CHOL 255* 125    Hepatic Function Panel Recent Labs    03/03/22 1151 03/22/22 1200 03/23/22 0442  PROT 6.8 6.6 5.8*  ALBUMIN 4.1 3.9 3.2*  AST $Re'21 21 18  'OzR$ ALT $R'18 30 22  'wV$ ALKPHOS 110 82 71  BILITOT 0.4 0.9 1.2     IMPRESSION:    ICD-10-CM   1. Resistant hypertension  I10     2. Chronic HFrEF (heart failure with reduced ejection fraction) (HCC)  I50.22     3. Nonischemic cardiomyopathy (HCC)  I42.8     4. Conduction disorder, unspecified  I45.9 Ambulatory referral to Cardiac Electrophysiology    5. History of stroke   Z86.73     6. Severe aortic stenosis  I35.0     7. S/P TAVR (transcatheter aortic valve replacement)  Z95.2     8. Mixed hyperlipidemia  E78.2     9. Type 2 diabetes mellitus with hyperglycemia, without long-term current use of insulin (HCC)  E11.65     10. Former smoker  Z87.891        RECOMMENDATIONS: Nicholas Shelton is a 81 y.o. male whose past medical history and cardiac risk factors include: Hx of aortic stenosis s/p 66mm Edward Sapien 3 Valve, heart failure with reduced EF, retinal artery branch occlusion, paroxysmal atrial fibrillation/flutter, saccular left PCOM aneurysm, hypertension, hyperlipidemia, non-insulin-dependent diabetes mellitus type 2, former smoker, advanced age.  Resistant hypertension Home blood pressures are improving but currently not at goal. Office blood pressure not at goal-does have a degree of whitecoat hypertension. He has tolerated transition of clonidine patch to clonidine 0.2 mg p.o. 3 times daily. I suspect the degree of his blood pressure is due to untreated anxiety due to family and financial reasons.  Have asked him to discuss anxiety management with PCP. Sleep study pending. Renal duplex suggestive of left ICA stenosis less than 60%.  Bilateral renal cysts he was encouraged to follow-up with urology or nephrology. Once the blood pressures are better controlled we will provide preoperative risk stratification for left PCOM aneurysm repair  Conduction disorder, unspecified Patient has illustrated multiple underlying rhythms on his prior EKGs from sinus rhythm, atrial tach with escape junctional, complete heart block, atrial flutter, etc. Able to tolerate Toprol-XL 25 mg p.o. daily.  However with up titration of AV nodal blocking agents he has transient heart blocks. Clinically he feels tired and  fatigue despite elevated blood pressures. I reached out to Dr. Lars Mage after his last office visit in May 2023 to discuss his case.  He has an upcoming  consultation next week.  We will follow-up on the recommendations.  Chronic HFrEF (heart failure with reduced ejection fraction) (HCC) Stage C, NYHA class II/III. Medications reconciled. Strict I's and O's and daily weights. Reemphasized importance of a low-salt diet. Pending sleep study evaluation.  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 Educated on importance of secondary prevention.  Severe aortic stenosis & S/P TAVR (transcatheter aortic valve replacement) Continue to monitor.  Mixed hyperlipidemia Improving. LDL 165 mg/dL April 2023. LDL 61 mg/dL May 2023 Continue current medical therapy. No changes warranted at this time.  Prior to undergoing endovascular intervention for the incidental finding of 4 mm supraclinoid left ICA aneurysm would like him to be evaluated by EP and better blood pressure management.  Patient and sister-in-law both verbalized understanding.  FINAL MEDICATION LIST END OF ENCOUNTER: No orders of the defined types were placed in this encounter.    Current Outpatient Medications:    acetaminophen (TYLENOL) 500 MG tablet, Take 500 mg by mouth every 8 (eight) hours as needed for moderate pain., Disp: , Rfl:    apixaban (ELIQUIS) 5 MG TABS tablet, Take 1 tablet (5 mg total) by mouth 2 (two) times daily., Disp: 180 tablet, Rfl: 0   atorvastatin (LIPITOR) 10 MG tablet, Take 10 mg by mouth daily., Disp: , Rfl:    cloNIDine (CATAPRES) 0.2 MG tablet, TAKE 1 TABLET(0.2 MG) BY MOUTH THREE TIMES DAILY, Disp: 270 tablet, Rfl: 0   clopidogrel (PLAVIX) 75 MG tablet, Take 1 tablet (75 mg total) by mouth daily., Disp: 30 tablet, Rfl: 1   dapagliflozin propanediol (FARXIGA) 10 MG TABS tablet, Take 1 tablet (10 mg total) by mouth daily., Disp: 30 tablet, Rfl: 1   hydrALAZINE (APRESOLINE) 100 MG tablet, Take 1  tablet (100 mg total) by mouth 3 (three) times daily., Disp: 270 tablet, Rfl: 2   isosorbide dinitrate (ISORDIL) 20 MG tablet, Take 2 tablets (40 mg total) by mouth 3 (three) times daily., Disp: 180 tablet, Rfl: 1   metFORMIN (GLUCOPHAGE) 1000 MG tablet, Take 1 tablet (1,000 mg total) by mouth 2 (two) times daily with a meal., Disp: , Rfl:    metoprolol succinate (TOPROL-XL) 25 MG 24 hr tablet, Take 1 tablet (25 mg total) by mouth daily., Disp: 30 tablet, Rfl: 1   Multiple Vitamin (MULTI VITAMIN MENS PO), Take by mouth., Disp: , Rfl:    pantoprazole (PROTONIX) 40 MG tablet, Take 40 mg by mouth daily before breakfast., Disp: , Rfl:    Psyllium 28.3 % POWD, Take by mouth., Disp: , Rfl:    sacubitril-valsartan (ENTRESTO) 97-103 MG, Take 1 tablet by mouth 2 (two) times daily., Disp: 60 tablet, Rfl: 1   spironolactone (ALDACTONE) 25 MG tablet, Take 0.5 tablets (12.5 mg total) by mouth every morning., Disp: 45 tablet, Rfl: 0  Orders Placed This Encounter  Procedures   Ambulatory referral to Cardiac Electrophysiology   There are no Patient Instructions on file for this visit.   --Continue cardiac medications as reconciled in final medication list. --Return in about 4 weeks (around 05/16/2022) for Follow up, BP, heart failure management.. Or sooner if needed. --Continue follow-up with your primary care physician regarding the management of your other  chronic comorbid conditions.  Patient's questions and concerns were addressed to his satisfaction. He voices understanding of the instructions provided during this encounter.   This note was created using a voice recognition software as a result there may be grammatical errors inadvertently enclosed that do not reflect the nature of this encounter. Every attempt is made to correct such errors.  Carigan Lister, DO, FACC  Pager: [phone] Office: [phone]   

## 2022-04-24 ENCOUNTER — Ambulatory Visit: Payer: Medicare Other | Admitting: Cardiology

## 2022-04-24 ENCOUNTER — Encounter: Payer: Self-pay | Admitting: Cardiology

## 2022-04-24 VITALS — BP 190/90 | HR 60 | Ht 71.0 in | Wt 197.2 lb

## 2022-04-24 DIAGNOSIS — I350 Nonrheumatic aortic (valve) stenosis: Secondary | ICD-10-CM

## 2022-04-24 DIAGNOSIS — I10 Essential (primary) hypertension: Secondary | ICD-10-CM

## 2022-04-24 DIAGNOSIS — I5022 Chronic systolic (congestive) heart failure: Secondary | ICD-10-CM

## 2022-04-24 DIAGNOSIS — Z952 Presence of prosthetic heart valve: Secondary | ICD-10-CM | POA: Diagnosis not present

## 2022-04-24 NOTE — Patient Instructions (Addendum)
Medication Instructions:  Your physician recommends that you continue on your current medications as directed. Please refer to the Current Medication list given to you today.  Labwork: None ordered.  Testing/Procedures: None ordered.  Follow-Up: Your physician wants you to follow-up as needed  Any Other Special Instructions Will Be Listed Below (If Applicable).  If you need a refill on your cardiac medications before your next appointment, please call your pharmacy.   Important Information About Sugar        

## 2022-04-24 NOTE — Progress Notes (Addendum)
Electrophysiology Office Note:    Date:  04/24/2022   ID:  Nicholas Shelton, DOB 04-06-1941, MRN [id number]  PCP:  Christain Sacramento, MD  [facility] Cardiologist:  None  [facility] Electrophysiologist:  Vickie Epley, MD   Referring MD: Christain Sacramento, MD   Chief Complaint: New patient consult for conduction disease  History of Present Illness:    Nicholas Shelton is a 81 y.o. male who presents for an evaluation of conduction disease and possible EP study/PPM at the request of Dr. Terri Skains. Their medical history includes AV block, aortic stenosis s/p TAVR, paroxysmal atrial fibrillation/flutter, heart failure, peripheral neuropathy, hypertension, hyperlipidemia, diabetes, GERD, gout, and peptic ulcer.   He saw Dr. Terri Skains on 04/18/2022 and reported feeling tired and fatigue despite his elevated blood pressures. He was noted to have multiple underlying rhythms shown on his prior EKGs, from sinus rhythm, atrial tach with escape junctional, complete heart block, and atrial flutter. He was able to tolerate Toprol-XL 25 mg daily. However, he had transient heart blocks with up titration of AV nodal blocking agents. He was referred to EP for further evaluation.  He is accompanied by his sister-in-law, who notes that he is very hard of hearing. Overall, he is frequently fatigued. After minimal activity he begins to feel weak.  He has noticed that his heart rate will increase from 45  to 65-70 bpm when he goes outside to work. If his blood pressure is high while resting, it will decrease when he gets up and is active. This morning his BP was 595 systolic. In clinic today his blood pressure is 188/74, and 190/90 on manual recheck. He was walked around the office today, and it was found his heart rate was able to reach 100 bpm.  However, he exercises frequently and mows his lawn.  His sister-in-law notes that it is sometimes difficult to know if he has missed any doses of his medication.  He denies any  palpitations, chest pain, shortness of breath, or peripheral edema. No lightheadedness, headaches, syncope, orthopnea, or PND.     Past Medical History:  Diagnosis Date   Actinic keratosis    Arthritis    AV block    Bradycardia    Degeneration of lumbar or lumbosacral intervertebral disc    Deviated nasal septum    Diabetes mellitus without complication (HCC)    Gastroesophageal reflux disease without esophagitis    Generalized anxiety disorder    Gout with tophus    Hearing loss    Hyperlipidemia    Hypertension    Insomnia    Murmur    Aortic stenosis   Nasal turbinate hypertrophy    Osteoarthritis of wrist    PAC (premature atrial contraction)    Peptic ulcer    Peripheral neuropathy    PVC (premature ventricular contraction)    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 BCyndia Bent  Seasonal allergic rhinitis due to pollen     Past Surgical History:  Procedure Laterality Date   COLONOSCOPY     ELBOW SURGERY     pinched nerve on both arms   INGUINAL HERNIA REPAIR Right 10/30/2018   Procedure: OPEN REPAIR OF INCARCERATED RIGHT INGUINAL HERNIA WITH MESH;  Surgeon: WGreer Pickerel MD;  Location: [facility]  Service: General;  Laterality: Right;   LEFT HEART CATH AND CORONARY ANGIOGRAPHY N/A 01/15/2021   Procedure: LEFT HEART CATH AND CORONARY ANGIOGRAPHY;  Surgeon:  Adrian Prows, MD;  Location: [facility];  Service: Cardiovascular;  Laterality: N/A;   TEE WITHOUT CARDIOVERSION N/A 12/24/2021   Procedure: TRANSESOPHAGEAL ECHOCARDIOGRAM (TEE);  Surgeon: Sherren Mocha, MD;  Location: [place];  Service: Open Heart Surgery;  Laterality: N/A;   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   Procedure: TOTAL KNEE ARTHROPLASTY;  Surgeon: Sydnee Cabal, MD;  Location: [facility];  Service: Orthopedics;  Laterality: Right;  with adductor canal   WRIST SURGERY     pinched nerve    Current Medications: Current Meds   Medication Sig   acetaminophen (TYLENOL) 500 MG tablet Take 500 mg by mouth every 8 (eight) hours as needed for moderate pain.   apixaban (ELIQUIS) 5 MG TABS tablet Take 1 tablet (5 mg total) by mouth 2 (two) times daily.   atorvastatin (LIPITOR) 10 MG tablet Take 10 mg by mouth daily.   cloNIDine (CATAPRES) 0.2 MG tablet TAKE 1 TABLET(0.2 MG) BY MOUTH THREE TIMES DAILY   clopidogrel (PLAVIX) 75 MG tablet Take 1 tablet (75 mg total) by mouth daily.   dapagliflozin propanediol (FARXIGA) 10 MG TABS tablet Take 1 tablet (10 mg total) by mouth daily.   hydrALAZINE (APRESOLINE) 100 MG tablet Take 1 tablet (100 mg total) by mouth 3 (three) times daily.   isosorbide dinitrate (ISORDIL) 20 MG tablet Take 2 tablets (40 mg total) by mouth 3 (three) times daily.   metFORMIN (GLUCOPHAGE) 1000 MG tablet Take 1 tablet (1,000 mg total) by mouth 2 (two) times daily with a meal.   metoprolol succinate (TOPROL-XL) 25 MG 24 hr tablet Take 1 tablet (25 mg total) by mouth daily.   Multiple Vitamin (MULTI VITAMIN MENS PO) Take by mouth.   pantoprazole (PROTONIX) 40 MG tablet Take 40 mg by mouth daily before breakfast.   Psyllium 28.3 % POWD Take by mouth.   sacubitril-valsartan (ENTRESTO) 97-103 MG Take 1 tablet by mouth 2 (two) times daily.   spironolactone (ALDACTONE) 25 MG tablet Take 0.5 tablets (12.5 mg total) by mouth every morning.     Allergies:   Trazodone and nefazodone   Social History   Socioeconomic History   Marital status: Married    Spouse name: Not on file   Number of children: 3   Years of education: Not on file   Highest education level: Not on file  Occupational History   Not on file  Tobacco Use   Smoking status: Former    Packs/day: 0.50    Years: 15.00    Total pack years: 7.50    Types: Cigarettes    Quit date: 45    Years since quitting: 27.4   Smokeless tobacco: Never  Vaping Use   Vaping Use: Never used  Substance and Sexual Activity   Alcohol use: No   Drug use:  No   Sexual activity: Not on file    Comment: married  Other Topics Concern   Not on file  Social History Narrative   Not on file   Social Determinants of Health   Financial Resource Strain: Not on file  Food Insecurity: Not on file  Transportation Needs: Not on file  Physical Activity: Not on file  Stress: Not on file  Social Connections: Not on file     Family History: The patient's family history includes Heart attack in his mother.  ROS:   Please see the history of present illness.    (+) Fatigue (+) Generalized weakness All other systems reviewed and are negative.  EKGs/Labs/Other  Studies Reviewed:    The following studies were reviewed today:  03/2022  Monitor: Cardiac monitor [facility] Patch): March 03, 2022- Mar 17, 2022 Patch Wear Time:  14 days and 0 hours  Dominant rhythm atrial flutter with variable conduction. Heart rate 32-99 bpm.  Avg HR 47 bpm. No ventricular tachycardia, high grade AV block, pauses (3 seconds or longer). Total ventricular ectopic burden <1%. Total supraventricular ectopic burden <1%. Patient triggered events: 0.  03/23/2022  Bilateral Renal Artery Doppler: Summary:  Renal:     Right: Normal size right kidney. Normal right Resisitive Index. No         evidence of right renal artery stenosis. RRV flow present.         Cyst(s) noted.  Left:  Normal size of left kidney. Abnormal left Resisitve Index.         1-59% stenosis of the left renal artery. LRV flow present.         Cyst(s) noted- largest measuring 7.4 x 6.6 x 6.7 cm.  Mesenteric:  70 to 99% stenosis in the superior mesenteric artery.  01/22/2022  Echo: 1. Left ventricular ejection fraction, by estimation, is 35 to 40%. The  left ventricle has moderately decreased function. The left ventricle  demonstrates global hypokinesis. There is mild left ventricular  hypertrophy of the septal segment. Left  ventricular diastolic parameters are consistent with Grade II diastolic   dysfunction (pseudonormalization). Elevated left ventricular end-diastolic  pressure.   2. Right ventricular systolic function is normal. The right ventricular  size is normal. There is severely elevated pulmonary artery systolic  pressure.   3. Left atrial size was severely dilated.   4. Right atrial size was moderately dilated.   5. The mitral valve is degenerative. Mild mitral valve regurgitation. No  evidence of mitral stenosis.   6. The aortic valve has been repaired/replaced. Aortic valve  regurgitation is not visualized. No aortic stenosis is present. There is a  29 mm Sapien prosthetic (TAVR) valve present in the aortic position.  Procedure Date: 12/24/21. Echo findings are  consistent with normal structure and function of the aortic valve  prosthesis. Aortic valve area, by VTI measures 1.97 cm. Aortic valve mean  gradient measures 8.3 mmHg. Aortic valve Vmax measures 1.95 m/s.   7. The inferior vena cava is dilated in size with >50% respiratory  variability, suggesting right atrial pressure of 8 mmHg.   Comparison(s): 12/25/21 EF 40-45%. AV 68mHg mean PG, 176mg peak PG.     EKG:   EKG is personally reviewed.  04/24/2022: EKG shows atrial fibrillation/flutter with some RR variability and a ventricular rate of 60 bpm.     Recent Labs: 03/14/2022: Magnesium 2.0; NT-Pro BNP 3,403 03/23/2022: ALT 22 03/24/2022: TSH 0.532 03/26/2022: B Natriuretic Peptide 1,006.4; BUN 30; Creatinine, Ser 1.50; Hemoglobin 9.6; Platelets 108; Potassium 4.0; Sodium 134   Recent Lipid Panel    Component Value Date/Time   CHOL 125 03/23/2022 0443   CHOL 183 01/04/2021 0926   TRIG 66 03/23/2022 0443   HDL 51 03/23/2022 0443   HDL 41 01/04/2021 0926   CHOLHDL 2.5 03/23/2022 0443   VLDL 13 03/23/2022 0443   LDLCALC 61 03/23/2022 0443   LDLCALC 117 (H) 01/04/2021 0926    Physical Exam:    VS:  BP (!) 190/90 (BP Location: Left Arm, Patient Position: Sitting, Cuff Size: Normal)   Pulse 60   Ht  '5\' 11"'$  (1.803 m)   Wt 197 lb 3.2 oz (89.4 kg)  SpO2 96%   BMI 27.50 kg/m     Wt Readings from Last 3 Encounters:  04/24/22 197 lb 3.2 oz (89.4 kg)  04/18/22 195 lb 9.6 oz (88.7 kg)  04/04/22 186 lb 9.6 oz (84.6 kg)     GEN: Well nourished, well developed in no acute distress.  Elderly HEENT: Normal NECK: No JVD; No carotid bruits LYMPHATICS: No lymphadenopathy CARDIAC: RRR, no murmurs, rubs, gallops RESPIRATORY:  Clear to auscultation without rales, wheezing or rhonchi  ABDOMEN: Soft, non-tender, non-distended MUSCULOSKELETAL:  No edema; No deformity  SKIN: Warm and dry NEUROLOGIC:  Alert and oriented x 3 PSYCHIATRIC:  Normal affect       ASSESSMENT:    1. Chronic HFrEF (heart failure with reduced ejection fraction) ([place])   2. Severe aortic stenosis   3. S/P TAVR (transcatheter aortic valve replacement)   4. Resistant hypertension    PLAN:    In order of problems listed above:   #Severe aortic stenosis #TAVR in situ Patient doing well after his TAVR procedure.  He does still have some fatigue.  Prosthesis functioning well.  #Atrial flutter On Eliquis for stroke prophylaxis.  Rate controlled.  He has appropriate chronotropic response with exertion.  During the office visit today I walked the patient and his heart rate briskly went to 100 bpm but then returned to 60s with rest.  #Conduction disease History of complete heart block.   At this time no symptoms related to bradycardia or syncope/presyncope.  No indication for permanent pacing at this time.  We discussed the possibility that he would develop an indication for pacing in the future.  #Hypertension Significantly elevated.  This is despite high doses of multiple medications.  Working with Dr. Terri Skains regarding further evaluation and treatment.  Follow-up as needed.  Dr Terri Skains updated re: plan.  Total time of encounter: 60 minutes total time of encounter, including face-to-face patient care, coordination of  care and counseling regarding high complexity medical decision making.    Medication Adjustments/Labs and Tests Ordered: Current medicines are reviewed at length with the patient today.  Concerns regarding medicines are outlined above.  Orders Placed This Encounter  Procedures   EKG 12-Lead   No orders of the defined types were placed in this encounter.   I,Mathew Stumpf,acting as a Education administrator for Vickie Epley, MD.,have documented all relevant documentation on the behalf of Vickie Epley, MD,as directed by  Vickie Epley, MD while in the presence of Vickie Epley, MD.  I, Vickie Epley, MD, have reviewed all documentation for this visit. The documentation on 04/24/22 for the exam, diagnosis, procedures, and orders are all accurate and complete.   Signed, Hilton Cork. Quentin Ore, MD, [facility], [facility] 04/24/2022 9:26 PM    Electrophysiology [facility]

## 2022-04-29 ENCOUNTER — Telehealth: Payer: Self-pay | Admitting: Cardiology

## 2022-04-29 NOTE — Telephone Encounter (Signed)
Saraii,  Follow up with this and see why he refused.   ST

## 2022-05-05 ENCOUNTER — Other Ambulatory Visit: Payer: Self-pay

## 2022-05-05 DIAGNOSIS — Z7901 Long term (current) use of anticoagulants: Secondary | ICD-10-CM

## 2022-05-05 DIAGNOSIS — I4891 Unspecified atrial fibrillation: Secondary | ICD-10-CM

## 2022-05-05 MED ORDER — APIXABAN 5 MG PO TABS: 5.0000 mg | ORAL_TABLET | Freq: Two times a day (BID) | ORAL | 0 refills | Status: DC

## 2022-05-06 NOTE — Telephone Encounter (Signed)
From provider

## 2022-05-07 ENCOUNTER — Other Ambulatory Visit: Payer: Self-pay

## 2022-05-07 MED ORDER — METOPROLOL SUCCINATE ER 25 MG PO TB24: 25.0000 mg | ORAL_TABLET | Freq: Every day | ORAL | 3 refills | Status: DC

## 2022-05-14 ENCOUNTER — Other Ambulatory Visit: Payer: Self-pay

## 2022-05-14 MED ORDER — DAPAGLIFLOZIN PROPANEDIOL 10 MG PO TABS
10.0000 mg | ORAL_TABLET | Freq: Every day | ORAL | 1 refills | Status: DC
Start: 2022-05-14 — End: 2022-05-27

## 2022-05-15 ENCOUNTER — Ambulatory Visit: Payer: Medicare Other | Admitting: Cardiology

## 2022-05-15 ENCOUNTER — Encounter: Payer: Self-pay | Admitting: Cardiology

## 2022-05-15 ENCOUNTER — Other Ambulatory Visit: Payer: Self-pay

## 2022-05-15 VITALS — BP 178/81 | HR 55 | Temp 98.2°F | Resp 16 | Ht 71.0 in | Wt 199.2 lb

## 2022-05-15 DIAGNOSIS — Z952 Presence of prosthetic heart valve: Secondary | ICD-10-CM

## 2022-05-15 DIAGNOSIS — I10 Essential (primary) hypertension: Secondary | ICD-10-CM

## 2022-05-15 DIAGNOSIS — I4892 Unspecified atrial flutter: Secondary | ICD-10-CM

## 2022-05-15 DIAGNOSIS — Z87891 Personal history of nicotine dependence: Secondary | ICD-10-CM

## 2022-05-15 DIAGNOSIS — Z7901 Long term (current) use of anticoagulants: Secondary | ICD-10-CM

## 2022-05-15 DIAGNOSIS — I428 Other cardiomyopathies: Secondary | ICD-10-CM

## 2022-05-15 DIAGNOSIS — I4891 Unspecified atrial fibrillation: Secondary | ICD-10-CM

## 2022-05-15 DIAGNOSIS — E782 Mixed hyperlipidemia: Secondary | ICD-10-CM

## 2022-05-15 DIAGNOSIS — I5021 Acute systolic (congestive) heart failure: Secondary | ICD-10-CM

## 2022-05-15 DIAGNOSIS — I671 Cerebral aneurysm, nonruptured: Secondary | ICD-10-CM

## 2022-05-15 DIAGNOSIS — Z8673 Personal history of transient ischemic attack (TIA), and cerebral infarction without residual deficits: Secondary | ICD-10-CM

## 2022-05-15 DIAGNOSIS — Z01818 Encounter for other preprocedural examination: Secondary | ICD-10-CM

## 2022-05-15 DIAGNOSIS — E1165 Type 2 diabetes mellitus with hyperglycemia: Secondary | ICD-10-CM

## 2022-05-15 MED ORDER — APIXABAN 5 MG PO TABS: 5.0000 mg | ORAL_TABLET | Freq: Two times a day (BID) | ORAL | 3 refills | Status: DC

## 2022-05-15 MED ORDER — CLONIDINE HCL 0.3 MG PO TABS: 0.3000 mg | ORAL_TABLET | Freq: Three times a day (TID) | ORAL | 0 refills | Status: DC

## 2022-05-15 NOTE — Progress Notes (Signed)
ID:  Nicholas Shelton, DOB [birth year], MRN [id number]  PCP:  Christain Sacramento, MD  Cardiologist:  Rex Kras, DO, [facility] (established care 12/13/2020) Cardiac Electrophysiologist: Dr. Lars Mage  Date: 05/15/22 Last Office Visit: 04/18/2022  Chief Complaint  Patient presents with   Hypertension   Congestive Heart Failure   Follow-up    HPI  Nicholas Shelton is a 81 y.o. male whose past medical history and cardiovascular risk factors include: Hx of aortic stenosis s/p 90m Edward Sapien 3 Valve, heart failure with reduced EF, retinal artery branch occlusion, paroxysmal atrial fibrillation/flutter, saccular left PCOM aneurysm, hypertension, hyperlipidemia, non-insulin-dependent diabetes mellitus type 2, former smoker, advanced age.  In the past his aortic stenosis was being managed medically with serial echo; however, given the newly reduced LVEF he underwent 29 mm Edwards SAPIEN 3 valve implantation and did well postprocedure.  Given the reduced LVEF he was weaned off of clonidine (prior to estiablising care with myself he was on 0.673mbid) and his GDMT has been uptitrated. 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  Later he was found to be in atrial flutter and started ton metoprolol, amiodarone, and anticoagulation.  Unfortunately, developed transit complete heart block which resolved after reducing metoprolol and discontinuation of amiodarone. Since the last office visit he was seen by electrophysiology and given his underlying atrial flutter and possible conduction disease.  For now he is noted to have good chronotropic competence and not a candidate for device therapy.  Patient's blood pressure has been difficult to control despite being on multiple antihypertensive medications.  Over the last couple office  visits started him on clonidine again which has improved his blood pressures at home.  Patient states that his home blood pressures now range between 130-160 mmHg.  They usually elevated when he is in the office likely secondary to whitecoat hypertension/anxiety.   Patient has a known history of left ICA aneurysm and has been evaluated by interventional radiology for possible intervention.  They are waiting cardiac risk stratification.   Patient denies angina pectoris at today's office visit.  But on physical examination there is noted to have bilateral lower extremity swelling up to the thighs bilaterally.  He is gained approximately 13 pounds since May 2023.  ALLERGIES: Allergies  Allergen Reactions   Trazodone And Nefazodone Other (See Comments)    Sluggishness the next morning    MEDICATION LIST PRIOR TO VISIT: Current Meds  Medication Sig   acetaminophen (TYLENOL) 500 MG tablet Take 500 mg by mouth every 8 (eight) hours as needed for moderate pain.   atorvastatin (LIPITOR) 10 MG tablet Take 10 mg by mouth daily.   cloNIDine (CATAPRES) 0.3 MG tablet Take 1 tablet (0.3 mg total) by mouth 3 (three) times daily.   clopidogrel (PLAVIX) 75 MG tablet Take 1 tablet (75 mg total) by mouth daily.   dapagliflozin propanediol (FARXIGA) 10 MG TABS tablet Take 1 tablet (10 mg total) by mouth daily.   hydrALAZINE (APRESOLINE) 100 MG tablet Take 1 tablet (100 mg total) by mouth 3 (three) times daily.   isosorbide dinitrate (ISORDIL) 20 MG tablet Take 2 tablets (40 mg total) by mouth 3 (three) times daily.   metFORMIN (GLUCOPHAGE) 1000 MG tablet Take 1 tablet (1,000 mg total) by mouth 2 (two) times daily with a meal.  metoprolol succinate (TOPROL-XL) 25 MG 24 hr tablet Take 1 tablet (25 mg total) by mouth daily.   Multiple Vitamin (MULTI VITAMIN MENS PO) Take by mouth.   pantoprazole (PROTONIX) 40 MG tablet Take 40 mg by mouth daily before breakfast.   Psyllium 28.3 % POWD Take by mouth.    sacubitril-valsartan (ENTRESTO) 97-103 MG Take 1 tablet by mouth 2 (two) times daily.   spironolactone (ALDACTONE) 25 MG tablet Take 0.5 tablets (12.5 mg total) by mouth every morning.   [DISCONTINUED] apixaban (ELIQUIS) 5 MG TABS tablet Take 1 tablet (5 mg total) by mouth 2 (two) times daily.   [DISCONTINUED] cloNIDine (CATAPRES) 0.2 MG tablet TAKE 1 TABLET(0.2 MG) BY MOUTH THREE TIMES DAILY     PAST MEDICAL HISTORY: Past Medical History:  Diagnosis Date   Actinic keratosis    Arthritis    AV block    Bradycardia    Degeneration of lumbar or lumbosacral intervertebral disc    Deviated nasal septum    Diabetes mellitus without complication (HCC)    Gastroesophageal reflux disease without esophagitis    Generalized anxiety disorder    Gout with tophus    Hearing loss    Hyperlipidemia    Hypertension    Insomnia    Murmur    Aortic stenosis   Nasal turbinate hypertrophy    Osteoarthritis of wrist    PAC (premature atrial contraction)    Peptic ulcer    Peripheral neuropathy    PVC (premature ventricular contraction)    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 BCyndia Bent  Seasonal allergic rhinitis due to pollen     PAST SURGICAL HISTORY: Past Surgical History:  Procedure Laterality Date   COLONOSCOPY     ELBOW SURGERY     pinched nerve on both arms   INGUINAL HERNIA REPAIR Right 10/30/2018   Procedure: OPEN REPAIR OF INCARCERATED RIGHT INGUINAL HERNIA WITH MESH;  Surgeon: WGreer Pickerel MD;  [place] [facility]  Service: General;  Laterality: Right;   LEFT HEART CATH AND CORONARY ANGIOGRAPHY N/A 01/15/2021   Procedure: LEFT HEART CATH AND CORONARY ANGIOGRAPHY;  Surgeon: GAdrian Prows MD;  [facility];  Service: Cardiovascular;  Laterality: N/A;   TEE WITHOUT CARDIOVERSION N/A 12/24/2021   Procedure: TRANSESOPHAGEAL ECHOCARDIOGRAM (TEE);  Surgeon: CSherren Mocha MD;  [facility]   Service: Open Heart Surgery;  Laterality: N/A;   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   Procedure: TOTAL KNEE ARTHROPLASTY;  Surgeon: CSydnee Cabal MD;  Location: [facility];  Service: Orthopedics;  Laterality: Right;  with adductor canal   WRIST SURGERY     pinched nerve    FAMILY HISTORY: The patient family history includes Heart attack in his mother.  SOCIAL HISTORY:  The patient  reports that he quit smoking about 27 years ago. His smoking use included cigarettes. He has a 7.50 pack-year smoking history. He has never used smokeless tobacco. He reports that he does not drink alcohol and does not use drugs.  REVIEW OF SYSTEMS: Review of Systems  Constitutional: Positive for malaise/fatigue.  HENT:         Hard of hearing  Cardiovascular:  Positive for leg swelling. Negative for chest pain, dyspnea on exertion, near-syncope, orthopnea, palpitations, paroxysmal nocturnal dyspnea and syncope.  Respiratory:  Negative for shortness of breath.   Neurological:  Negative for dizziness, focal weakness and light-headedness.    PHYSICAL EXAM:  05/15/2022    1:38 PM 05/15/2022    1:29 PM 04/24/2022    2:13 PM  Vitals with BMI  Height  '5\' 11"'    Weight  199 lbs 3 oz   BMI  54.6   Systolic 503 546 568  Diastolic 81 79 90  Pulse 55 56    CONSTITUTIONAL: Well-developed and well-nourished. No acute distress.  SKIN: Skin is warm and dry. No rash noted. No cyanosis. No pallor. No jaundice HEAD: Normocephalic and atraumatic.  EYES: No scleral icterus MOUTH/THROAT: Moist oral membranes.  NECK: No JVD present. No thyromegaly noted. CHEST Normal respiratory effort. No intercostal retractions  LUNGS: Clear to auscultation bilaterally. No stridor. No wheezes. No rales.  CARDIOVASCULAR: Irregular, variable S1-S2, no murmurs, rubs or gallops appreciated. ABDOMINAL: Soft, nontender, nondistended, positive bowel sounds in all 4 quadrants, no apparent ascites.  EXTREMITIES: +2 peripheral edema up to  the thigh, stiffness in bilateral lower extremities, warm to touch. HEMATOLOGIC: No significant bruising NEUROLOGIC: Oriented to person, place, and time. Nonfocal. Normal muscle tone.  PSYCHIATRIC: Normal mood and affect. Normal behavior. Cooperative  RADIOLOGY:  MRI brain without contrast: 02/19/2022 1. No acute intracranial abnormality. 2. Moderate chronic small vessel ischemic disease. 3. 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  MRA head without contrast: 02/19/2022 1. Negative intracranial MRA for large vessel occlusion or other emergent finding. 2. Mild intracranial atherosclerotic disease without hemodynamically significant or correctable stenosis. 3. 4 mm saccular left PCOM aneurysm. This would likely be amendable to endovascular treatment. Consultation with neuro interventional radiology suggested.  CARDIAC DATABASE: EKG: 10/31/2021: Probable normal sinus rhythm cannot rule out ectopic atrial rhythm, IVCD suggestive of RBBB, diffuse nonspecific T wave abnormality.  No significant change compared to prior ECG.   01/29/2022: Sinus bradycardia, 48 bpm, first-degree AV block, occasional PVCs.    03/03/22: Atrial fibrillation, 52 bpm, diffuse nonspecific T wave abnormality.  Compared to prior ECG A-fib is new.  04/04/2022: Atrial tachycardia with escape junctional rhythm.  Echocardiogram: 06/18/2021:  Normal LV systolic function with visual EF 50-55%. Left ventricle cavity is normal in size. Mild left ventricular hypertrophy. Normal global wall motion. Doppler evidence of grade II (pseudonormal) diastolic dysfunction,  elevated LAP.  Left atrial cavity is mildly dilated.  Trileaflet aortic valve with no regurgitation. Severe aortic valve leaflet calcification. Moderate aortic valve stenosis. Peak velocity 3.27ms, Peak Gradient 39.8 mmHg, Mean Gradient 27 mmHg, AVA 0.8cm, DI 0.3.  Mild (Grade I) mitral regurgitation.  Moderate tricuspid regurgitation. Mild  pulmonary hypertension. RVSP measures 49 mmHg.  Insignificant pericardial effusion. There is no hemodynamic significance.  IVC is dilated with a respiratory response of >50%.  Compared to study 12/06/2020: LVEF was 55-60% and now 50-55%, AS remains moderate, RVSP was 336mg and now 4962m.  10/21/2021:  1. Compared to echo report from September 2022, LVEF is depressed and AS is severe.   2. Global longitudinal strain is -13.4%. Left ventricular ejection fraction, by estimation, is 40%. The left ventricle has moderately decreased function. The left ventricle demonstrates global hypokinesis. The left ventricular internal cavity size was mildly dilated. There is mild left ventricular hypertrophy.   3. Right ventricular systolic function is mildly reduced. The right ventricular size is normal.   4. Left atrial size was moderately dilated.   5. Right atrial size was mildly dilated.   6. Mild mitral valve regurgitation.   7. AV is thickened, calcified with restricted motion motion. Peak and mean gradients through the valve are 48 and 30 mm Hg  respectively. AVA (VTI) is 0.99cm2. Dimensionless index is 0.25 All consistent with severe  AS. Marland Kitchen Aortic valve regurgitation is not  visualized.   8. The inferior vena cava is dilated in size with <50% respiratory variability, suggesting right atrial pressure of 15 mmHg.  01/22/2022: LVEF 35-40%, global hypokinesis, mild LVH, grade 2 diastolic dysfunction, elevated LVEDP, RV size and function normal, severely elevated PASP, severely dilated left atrium, moderately dilated right atrium, mild MR, 21 9 mm SAPIEN 3 bioprosthetic aortic valve present, peak velocity 1.95 m/s, mean gradient 8.3 mmHg, AVA per VTI 1.97 cm, estimated RAP 8 mmHg.   Stress Testing: Lexiscan Tetrofosmin Stress Test 12/24/2020: Nondiagnostic ECG stress. The left ventricle is dilated in stress images more than rest images, LV end-diastolic volume 675 mL. There is mild diaphragmatic attenuation  artifact in the inferior wall. Superimposed on this there is a moderate-sized moderate decrease in counts in the basal inferior and inferolateral wall suggestive of ischemia. Gated SPECT imaging of the left ventricle was abnormal, demonstrating hypokinesis of the mid inferolateral wall, mid inferior wall and basal inferolateral wall. Stress LV EF is mildly dysfunctional 48%.  No previous exam available for comparison. High risk study.   Left heart catheterization 01/15/2021: LV: Normal LV systolic function.  EDP mildly elevated at 20 mmHg.  There is 22 mmHg peak to peak pressure gradient across the aortic valve.  Findings are consistent with mild aortic stenosis. Very mild coronary calcification and minimal luminal irregularity in the coronary vessels.  Right dominant circulation. Impression: No significant coronary disease angiography.  Coronary calcification is evident.  Normal LVEF and mild aortic stenosis. 50 mL contrast used.  Carotid artery duplex 12/19/2020: Minimal stenosis in the right internal carotid artery (1-15%). Doppler velocity suggests stenosis in the right Minimal stenosis in the left internal carotid artery (1-15%). Doppler velocity suggests stenosis in the left external carotid artery (<50%). Mild heterogeneous plaque noted bilaterally.  Antegrade right vertebral artery flow. Antegrade left vertebral artery flow. External carotid artery stenosis is probably source of bruit. Follow up study is appropriate if clinically indicated.  Renal duplex: 03/23/2022 Renal:  Right: Normal size right kidney. Normal right Resisitive Index. No evidence of right renal artery stenosis. RRV flow present.         Cyst(s) noted.  Left:  Normal size of left kidney. Abnormal left Resisitve Index. 1-59% stenosis of the left renal artery. LRV flow present.         Cyst(s) noted- largest measuring 7.4 x 6.6 x 6.7 cm.  Mesenteric:  70 to 99% stenosis in the superior mesenteric artery.   LABORATORY  DATA: External Labs: Collected: 11/29/2020 Creatinine 0.88 mg/dL. eGFR: 86 mL/min per 1.73 m Lipid profile: Total cholesterol 186, triglycerides 131, HDL 39, LDL 116, non HDL 147 Hemoglobin A1c: 7  External Labs: Collected: 04/03/2022 at [facility]. Hemoglobin 9.7 g/dL, hematocrit 30% Sodium 135, potassium four 5.2, chloride 104, bicarb 24, eGFR 57   LABORATORY DATA:    Latest Ref Rng & Units 03/26/2022    3:48 AM 03/25/2022    4:09 AM 03/23/2022    4:42 AM  CBC  WBC 4.0 - 10.5 K/uL 4.4  4.4  5.8   Hemoglobin 13.0 - 17.0 g/dL 9.6  9.3  10.0   Hematocrit 39.0 - 52.0 % 28.6  27.3  29.0   Platelets 150 - 400 K/uL 108  102  108        Latest Ref Rng & Units 03/26/2022  3:48 AM 03/25/2022    4:09 AM 03/24/2022    9:55 AM  CMP  Glucose 70 - 99 mg/dL 102  121  191   BUN 8 - 23 mg/dL '30  22  17   ' Creatinine 0.61 - 1.24 mg/dL 1.50  1.34  1.21   Sodium 135 - 145 mmol/L 134  133  132   Potassium 3.5 - 5.1 mmol/L 4.0  3.2  3.3   Chloride 98 - 111 mmol/L 101  99  100   CO2 22 - 32 mmol/L '25  26  24   ' Calcium 8.9 - 10.3 mg/dL 8.0  7.9  8.2     Lipid Panel  Lab Results  Component Value Date   CHOL 125 03/23/2022   HDL 51 03/23/2022   LDLCALC 61 03/23/2022   TRIG 66 03/23/2022   CHOLHDL 2.5 03/23/2022     No components found for: "NTPROBNP" Recent Labs    11/12/21 0937 12/30/21 1205 03/03/22 1151 03/14/22 1250  PROBNP 1,918* 2,011* 4,258* 3,403*   Recent Labs    03/03/22 1151 03/24/22 1419  TSH 0.602 0.532    BMP Recent Labs    03/24/22 0955 03/25/22 0409 03/26/22 0348  NA 132* 133* 134*  K 3.3* 3.2* 4.0  CL 100 99 101  CO2 '24 26 25  ' GLUCOSE 191* 121* 102*  BUN 17 22 30*  CREATININE 1.21 1.34* 1.50*  CALCIUM 8.2* 7.9* 8.0*  GFRNONAA 60* 53* 46*    HEMOGLOBIN A1C Lab Results  Component Value Date   HGBA1C 6.2 (H) 02/19/2022   MPG 131.24 02/19/2022    Hepatic Function Panel Recent Labs    03/03/22 1151  03/22/22 1200 03/23/22 0442  PROT 6.8 6.6 5.8*  ALBUMIN 4.1 3.9 3.2*  AST '21 21 18  ' ALT '18 30 22  ' ALKPHOS 110 82 71  BILITOT 0.4 0.9 1.2     IMPRESSION:    ICD-10-CM   1. Acute HFrEF (heart failure with reduced ejection fraction) (HCC)  I50.21 CMP14+EGFR    Pro b natriuretic peptide (BNP)    Hemoglobin and hematocrit, blood    Magnesium    2. Resistant hypertension  I10 cloNIDine (CATAPRES) 0.3 MG tablet    3. Aneurysm of left internal carotid artery  I67.1     4. Preprocedural examination  Z01.818     5. Nonischemic cardiomyopathy (HCC)  I42.8     6. S/P TAVR (transcatheter aortic valve replacement)  Z95.2     7. History of stroke  Z86.73     8. Mixed hyperlipidemia  E78.2     9. Type 2 diabetes mellitus with hyperglycemia, without long-term current use of insulin (HCC)  E11.65     10. Former smoker  Z87.891     18. Paroxysmal atrial flutter (HCC)  I48.92     12. Long term (current) use of anticoagulants  Z79.01        RECOMMENDATIONS: Tomislav Micale is a 81 y.o. male whose past medical history and cardiac risk factors include: Hx of aortic stenosis s/p 67m Edward Sapien 3 Valve, heart failure with reduced EF, retinal artery branch occlusion, paroxysmal atrial fibrillation/flutter, saccular left PCOM aneurysm, hypertension, hyperlipidemia, non-insulin-dependent diabetes mellitus type 2, former smoker, advanced age.  Acute HFrEF (heart failure with reduced ejection fraction) (HCC) Stage C, NYHA class II/III. Has gained approximately 13 pounds since May 2023.   On physical examination has noted to have bilateral lower extremity swelling extending up to the thighs. He is on appropriate  guideline directed medical therapy. Both patient and his sister-in-law acknowledge the bilateral lower extremity swelling up to the thighs. Patient refuses to be hospitalized for diuresis.  We will check labs prior to up titration of diuretic therapy.  Further recommendations to  follow.  Resistant hypertension Office blood pressures are not well controlled.  Home blood pressures are better controlled.  Per patient and sister-in-law SBP ranges between 130-160 mmHg. Renal duplex suggestive of left renal artery stenosis <60% Higher BP readings in the morning requested he have a sleep study but he states "I sleep fine." Refused it again.  Will increase clonidine from 0.2 mg 3 times daily to 0.3 mg 3 times daily.   Aneurysm of left internal carotid artery Like to optimize his blood pressure medications and now heart failure prior to providing preoperative risk stratification. Currently followed by interventional radiology.  Nonischemic cardiomyopathy ([place]) See above  S/P TAVR (transcatheter aortic valve replacement) Last echo March 2023. Patient is aware of antibiotic prophylaxis prior to dental cleaning/procedures  History of stroke History of retinal artery branch occlusion & MRI notes chronic right frontal and left cerebellar infarct.  Educated on importance of secondary prevention.  Mixed hyperlipidemia Improving. Initially LDL 165 mg/dL as of April 2023.  Most recent LDL less than 70 mg/dL. Continue current medical therapy.  Paroxysmal atrial flutter (HCC) Rate control: Metoprolol. Rhythm control: N/A. Thromboembolic prophylaxis: Eliquis Does not endorse evidence of bleeding.  Reemphasized the risks, benefits, and alternatives to anticoagulation.  FINAL MEDICATION LIST END OF ENCOUNTER: Meds ordered this encounter  Medications   cloNIDine (CATAPRES) 0.3 MG tablet    Sig: Take 1 tablet (0.3 mg total) by mouth 3 (three) times daily.    Dispense:  270 tablet    Refill:  0     Current Outpatient Medications:    acetaminophen (TYLENOL) 500 MG tablet, Take 500 mg by mouth every 8 (eight) hours as needed for moderate pain., Disp: , Rfl:    atorvastatin (LIPITOR) 10 MG tablet, Take 10 mg by mouth daily., Disp: , Rfl:    cloNIDine (CATAPRES) 0.3 MG  tablet, Take 1 tablet (0.3 mg total) by mouth 3 (three) times daily., Disp: 270 tablet, Rfl: 0   clopidogrel (PLAVIX) 75 MG tablet, Take 1 tablet (75 mg total) by mouth daily., Disp: 30 tablet, Rfl: 1   dapagliflozin propanediol (FARXIGA) 10 MG TABS tablet, Take 1 tablet (10 mg total) by mouth daily., Disp: 30 tablet, Rfl: 1   hydrALAZINE (APRESOLINE) 100 MG tablet, Take 1 tablet (100 mg total) by mouth 3 (three) times daily., Disp: 270 tablet, Rfl: 2   isosorbide dinitrate (ISORDIL) 20 MG tablet, Take 2 tablets (40 mg total) by mouth 3 (three) times daily., Disp: 180 tablet, Rfl: 1   metFORMIN (GLUCOPHAGE) 1000 MG tablet, Take 1 tablet (1,000 mg total) by mouth 2 (two) times daily with a meal., Disp: , Rfl:    metoprolol succinate (TOPROL-XL) 25 MG 24 hr tablet, Take 1 tablet (25 mg total) by mouth daily., Disp: 90 tablet, Rfl: 3   Multiple Vitamin (MULTI VITAMIN MENS PO), Take by mouth., Disp: , Rfl:    pantoprazole (PROTONIX) 40 MG tablet, Take 40 mg by mouth daily before breakfast., Disp: , Rfl:    Psyllium 28.3 % POWD, Take by mouth., Disp: , Rfl:    sacubitril-valsartan (ENTRESTO) 97-103 MG, Take 1 tablet by mouth 2 (two) times daily., Disp: 60 tablet, Rfl: 1   spironolactone (ALDACTONE) 25 MG tablet, Take 0.5 tablets (12.5 mg  total) by mouth every morning., Disp: 45 tablet, Rfl: 0   apixaban (ELIQUIS) 5 MG TABS tablet, Take 1 tablet (5 mg total) by mouth 2 (two) times daily., Disp: 180 tablet, Rfl: 3  Orders Placed This Encounter  Procedures   CMP14+EGFR   Pro b natriuretic peptide (BNP)   Hemoglobin and hematocrit, blood   Magnesium   There are no Patient Instructions on file for this visit.   --Continue cardiac medications as reconciled in final medication list. --Return in about 2 weeks (around 05/29/2022) for Follow up, heart failure management.. Or sooner if needed. --Continue follow-up with your primary care physician regarding the management of your other chronic comorbid  conditions.  Patient's questions and concerns were addressed to his satisfaction. He voices understanding of the instructions provided during this encounter.   This note was created using a voice recognition software as a result there may be grammatical errors inadvertently enclosed that do not reflect the nature of this encounter. Every attempt is made to correct such errors.  Rex Kras, [place], [facility]  Pager: [phone] Office: [phone]

## 2022-05-16 ENCOUNTER — Other Ambulatory Visit: Payer: Self-pay | Admitting: Cardiology

## 2022-05-16 DIAGNOSIS — I5021 Acute systolic (congestive) heart failure: Secondary | ICD-10-CM

## 2022-05-16 LAB — CMP14+EGFR
ALT: 24 IU/L (ref 0–44)
AST: 20 IU/L (ref 0–40)
Albumin/Globulin Ratio: 1.7 (ref 1.2–2.2)
Albumin: 4 g/dL (ref 3.6–4.6)
Alkaline Phosphatase: 173 IU/L — ABNORMAL HIGH (ref 44–121)
BUN/Creatinine Ratio: 18 (ref 10–24)
BUN: 23 mg/dL (ref 8–27)
Bilirubin Total: 0.5 mg/dL (ref 0.0–1.2)
CO2: 21 mmol/L (ref 20–29)
Calcium: 8.9 mg/dL (ref 8.6–10.2)
Chloride: 106 mmol/L (ref 96–106)
Creatinine, Ser: 1.25 mg/dL (ref 0.76–1.27)
Globulin, Total: 2.3 g/dL (ref 1.5–4.5)
Glucose: 117 mg/dL — ABNORMAL HIGH (ref 70–99)
Potassium: 4.8 mmol/L (ref 3.5–5.2)
Sodium: 140 mmol/L (ref 134–144)
Total Protein: 6.3 g/dL (ref 6.0–8.5)
eGFR: 57 mL/min/{1.73_m2} — ABNORMAL LOW (ref >59)

## 2022-05-16 LAB — MAGNESIUM: Magnesium: 1.9 mg/dL (ref 1.6–2.3)

## 2022-05-16 LAB — HEMOGLOBIN AND HEMATOCRIT, BLOOD
Hematocrit: 31.6 % — ABNORMAL LOW (ref 37.5–51.0)
Hemoglobin: 10.5 g/dL — ABNORMAL LOW (ref 13.0–17.7)

## 2022-05-16 LAB — PRO B NATRIURETIC PEPTIDE: NT-Pro BNP: 6876 pg/mL — ABNORMAL HIGH (ref 0–486)

## 2022-05-16 MED ORDER — BUMETANIDE 1 MG PO TABS: 1.0000 mg | ORAL_TABLET | Freq: Every morning | ORAL | 0 refills | Status: DC

## 2022-05-16 NOTE — Progress Notes (Signed)
CARDIOLOGY PROGRESS NOTE 05/16/22  Patient's name: Nicholas Shelton.   MRN: [id number].    DOB: 03/02/1941 Primary care provider: Christain Sacramento, MD. Primary cardiologist: Rex Kras, DO, [facility]  Interaction regarding this patient's care today: During yesterday's office visit patient was recommended to go to the ED for diuresis that he is gained 13 pounds since last office visit and given his multiple comorbid conditions he would be better off being monitored on telemetry and regular labs to evaluate for renal function electrolyte abnormalities.  However patient refused.  Yesterday labs reviewed.  Renal function is stable but NT proBNP has essentially doubled since May 2023.  I spoke to the patient's sister-in-law [name] and reemphasized the importance of going to the hospital.  Patient has been resistant.  Impression:   ICD-10-CM   1. Acute HFrEF (heart failure with reduced ejection fraction) (HCC)  I50.21 bumetanide (BUMEX) 1 MG tablet    Basic metabolic panel    Pro b natriuretic peptide (BNP)    Magnesium      Meds ordered this encounter  Medications   bumetanide (BUMEX) 1 MG tablet    Sig: Take 1 tablet (1 mg total) by mouth every morning.    Dispense:  90 tablet    Refill:  0    Orders Placed This Encounter  Procedures   Basic metabolic panel   Pro b natriuretic peptide (BNP)   Magnesium    Recommendations: Discontinue spironolactone 25 mg p.o. every morning.  Start Bumex 1 mg p.o. every morning.  Labs in 1 week to evaluate kidney function and electrolytes.  Recent labs from 05/15/2022 also noted an isolated elevation of alkaline phosphatase.  Clinical significance unknown recommended follow-up with PCP for further investigation.  Telephone encounter total time: 12 minutes.  Rex Kras, [place], [facility]  Pager: [phone] Office: [phone]

## 2022-05-19 ENCOUNTER — Other Ambulatory Visit: Payer: Self-pay | Admitting: Cardiology

## 2022-05-20 ENCOUNTER — Other Ambulatory Visit: Payer: Self-pay

## 2022-05-20 ENCOUNTER — Other Ambulatory Visit: Payer: Self-pay | Admitting: Cardiology

## 2022-05-24 LAB — BASIC METABOLIC PANEL
BUN/Creatinine Ratio: 21 (ref 10–24)
BUN: 27 mg/dL (ref 8–27)
CO2: 21 mmol/L (ref 20–29)
Calcium: 8.6 mg/dL (ref 8.6–10.2)
Chloride: 102 mmol/L (ref 96–106)
Creatinine, Ser: 1.31 mg/dL — ABNORMAL HIGH (ref 0.76–1.27)
Glucose: 130 mg/dL — ABNORMAL HIGH (ref 70–99)
Potassium: 4 mmol/L (ref 3.5–5.2)
Sodium: 139 mmol/L (ref 134–144)
eGFR: 54 mL/min/{1.73_m2} — ABNORMAL LOW (ref >59)

## 2022-05-24 LAB — PRO B NATRIURETIC PEPTIDE: NT-Pro BNP: 6408 pg/mL — ABNORMAL HIGH (ref 0–486)

## 2022-05-27 ENCOUNTER — Other Ambulatory Visit: Payer: Self-pay

## 2022-05-27 MED ORDER — DAPAGLIFLOZIN PROPANEDIOL 10 MG PO TABS: 10.0000 mg | ORAL_TABLET | Freq: Every day | ORAL | 3 refills | Status: DC

## 2022-05-29 ENCOUNTER — Encounter: Payer: Self-pay | Admitting: Cardiology

## 2022-05-29 ENCOUNTER — Ambulatory Visit: Payer: Medicare Other | Admitting: Cardiology

## 2022-05-29 VITALS — BP 166/70 | HR 56 | Temp 97.7°F | Resp 16 | Ht 71.0 in | Wt 184.6 lb

## 2022-05-29 DIAGNOSIS — Z952 Presence of prosthetic heart valve: Secondary | ICD-10-CM

## 2022-05-29 DIAGNOSIS — Z7901 Long term (current) use of anticoagulants: Secondary | ICD-10-CM

## 2022-05-29 DIAGNOSIS — Z87891 Personal history of nicotine dependence: Secondary | ICD-10-CM

## 2022-05-29 DIAGNOSIS — I5022 Chronic systolic (congestive) heart failure: Secondary | ICD-10-CM

## 2022-05-29 DIAGNOSIS — I10 Essential (primary) hypertension: Secondary | ICD-10-CM

## 2022-05-29 DIAGNOSIS — I428 Other cardiomyopathies: Secondary | ICD-10-CM

## 2022-05-29 DIAGNOSIS — I4892 Unspecified atrial flutter: Secondary | ICD-10-CM

## 2022-05-29 DIAGNOSIS — Z8673 Personal history of transient ischemic attack (TIA), and cerebral infarction without residual deficits: Secondary | ICD-10-CM

## 2022-05-29 DIAGNOSIS — E1165 Type 2 diabetes mellitus with hyperglycemia: Secondary | ICD-10-CM

## 2022-05-29 DIAGNOSIS — I671 Cerebral aneurysm, nonruptured: Secondary | ICD-10-CM

## 2022-05-29 DIAGNOSIS — Z01818 Encounter for other preprocedural examination: Secondary | ICD-10-CM

## 2022-05-29 DIAGNOSIS — E782 Mixed hyperlipidemia: Secondary | ICD-10-CM

## 2022-05-29 NOTE — Progress Notes (Signed)
ID:  Nicholas Shelton, DOB 12-09-1940, MRN [id number]  PCP:  Christain Sacramento, MD  Cardiologist:  Rex Kras, DO, [facility] (established care 12/13/2020) Cardiac Electrophysiologist: Dr. Lars Mage  Date: 05/29/22 Last Office Visit: 05/15/2022  Chief Complaint  Patient presents with   heart failure management   Follow-up    HPI  Nicholas Shelton is a 81 y.o. male whose past medical history and cardiovascular risk factors include: Hx of aortic stenosis s/p 65m Edward Sapien 3 Valve, heart failure with reduced EF, retinal artery branch occlusion, paroxysmal atrial fibrillation/flutter, saccular left PCOM aneurysm, hypertension, hyperlipidemia, non-insulin-dependent diabetes mellitus type 2, former smoker, advanced age.  His aortic stenosis was being managed medically with serial echo; however, given the newly reduced LVEF he underwent TAVR implantation w/ 29 mm Edwards SAPIEN 3 valve. Given the reduced LVEF he was weaned off of clonidine (prior to estiablising care with myself he was on 0.630mbid) and his GDMT has been uptitrated. 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  Later he was found to be in atrial flutter and started ton metoprolol, amiodarone, and anticoagulation.  Unfortunately, developed transit complete heart block which resolved after reducing metoprolol and discontinuation of amiodarone. Since the last office visit he was seen by electrophysiology and given his underlying atrial flutter and possible conduction disease.  For now he is noted to have good chronotropic competence and not a candidate for device therapy.  Patient's blood pressure has been difficult to control despite being on multiple antihypertensive medications.  Over the last couple office visits started him on clonidine again which has  improved his blood pressures at home.  Patient states that his home blood pressures now range between 131-145 mmHg.  His BP is usually elevated in the office likely secondary to whitecoat hypertension/anxiety.   Patient has a known history of left ICA aneurysm and has been evaluated by interventional radiology for possible intervention. Patient denies angina pectoris at today's office visit.  Since last office visit patient was started on Bumex 1 mg p.o. daily and he has diuresed very well over the last 2 weeks, he has lost approximately 15 pounds and denies orthopnea, paroxysmal nocturnal dyspnea or lower extremity swelling.  ALLERGIES: Allergies  Allergen Reactions   Trazodone And Nefazodone Other (See Comments)    Sluggishness the next morning    MEDICATION LIST PRIOR TO VISIT: Current Meds  Medication Sig   acetaminophen (TYLENOL) 500 MG tablet Take 500 mg by mouth every 8 (eight) hours as needed for moderate pain.   apixaban (ELIQUIS) 5 MG TABS tablet Take 1 tablet (5 mg total) by mouth 2 (two) times daily.   atorvastatin (LIPITOR) 10 MG tablet Take 10 mg by mouth daily.   bumetanide (BUMEX) 1 MG tablet Take 1 tablet (1 mg total) by mouth every morning.   cloNIDine (CATAPRES) 0.3 MG tablet Take 1 tablet (0.3 mg total) by mouth 3 (three) times daily.   clopidogrel (PLAVIX) 75 MG tablet Take 1 tablet (75 mg total) by mouth daily.   dapagliflozin propanediol (FARXIGA) 10 MG TABS tablet Take 1 tablet (10 mg total) by mouth daily.   hydrALAZINE (APRESOLINE) 100 MG tablet Take 1 tablet (100 mg total) by mouth 3 (three) times daily.   isosorbide dinitrate (ISORDIL) 20 MG tablet TAKE 2 TABLETS BY MOUTH THREE  TIMES DAILY   metFORMIN (GLUCOPHAGE) 1000 MG tablet Take 1 tablet (1,000 mg total) by mouth 2 (two) times daily with a meal.   metoprolol succinate (TOPROL-XL) 25 MG 24 hr tablet Take 1 tablet (25 mg total) by mouth daily.   Multiple Vitamin (MULTI VITAMIN MENS PO) Take by mouth.    pantoprazole (PROTONIX) 40 MG tablet Take 40 mg by mouth daily before breakfast.   Psyllium 28.3 % POWD Take by mouth.   sacubitril-valsartan (ENTRESTO) 97-103 MG Take 1 tablet by mouth 2 (two) times daily.     PAST MEDICAL HISTORY: Past Medical History:  Diagnosis Date   Actinic keratosis    Arthritis    AV block    Bradycardia    Degeneration of lumbar or lumbosacral intervertebral disc    Deviated nasal septum    Diabetes mellitus without complication (HCC)    Gastroesophageal reflux disease without esophagitis    Generalized anxiety disorder    Gout with tophus    Hearing loss    Hyperlipidemia    Hypertension    Insomnia    Murmur    Aortic stenosis   Nasal turbinate hypertrophy    Osteoarthritis of wrist    PAC (premature atrial contraction)    Peptic ulcer    Peripheral neuropathy    PVC (premature ventricular contraction)    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 BCyndia Bent  Seasonal allergic rhinitis due to pollen     PAST SURGICAL HISTORY: Past Surgical History:  Procedure Laterality Date   COLONOSCOPY     ELBOW SURGERY     pinched nerve on both arms   INGUINAL HERNIA REPAIR Right 10/30/2018   Procedure: OPEN REPAIR OF INCARCERATED RIGHT INGUINAL HERNIA WITH MESH;  Surgeon: WGreer Pickerel MD;  Location: [facility]  Service: General;  Laterality: Right;   LEFT HEART CATH AND CORONARY ANGIOGRAPHY N/A 01/15/2021   Procedure: LEFT HEART CATH AND CORONARY ANGIOGRAPHY;  Surgeon: GAdrian Prows MD;  [facility];  Service: Cardiovascular;  Laterality: N/A;   TEE WITHOUT CARDIOVERSION N/A 12/24/2021   Procedure: TRANSESOPHAGEAL ECHOCARDIOGRAM (TEE);  Surgeon: CSherren Mocha MD;  [facility]  Service: Open Heart Surgery;  Laterality: N/A;   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   Procedure: TOTAL KNEE ARTHROPLASTY;  Surgeon: CSydnee Cabal MD;  Location: [facility];  Service:  Orthopedics;  Laterality: Right;  with adductor canal   WRIST SURGERY     pinched nerve    FAMILY HISTORY: The patient family history includes Heart attack in his mother.  SOCIAL HISTORY:  The patient  reports that he quit smoking about 27 years ago. His smoking use included cigarettes. He has a 7.50 pack-year smoking history. He has never used smokeless tobacco. He reports that he does not drink alcohol and does not use drugs.  REVIEW OF SYSTEMS: Review of Systems  Constitutional: Negative for malaise/fatigue.  HENT:         Hard of hearing  Cardiovascular:  Negative for chest pain, dyspnea on exertion, leg swelling, near-syncope, orthopnea, palpitations, paroxysmal nocturnal dyspnea and syncope.  Respiratory:  Negative for shortness of breath.   Neurological:  Negative for dizziness, focal weakness and light-headedness.    PHYSICAL EXAM:    05/29/2022    2:14 PM 05/15/2022    1:38 PM 05/15/2022    1:29 PM  Vitals with BMI  Height '5\' 11"'   '5\' 11"'   Weight  184 lbs 10 oz  199 lbs 3 oz  BMI 66.59  93.5  Systolic 701 779 390  Diastolic 70 81 79  Pulse 56 55 56   CONSTITUTIONAL: Well-developed and well-nourished. No acute distress.  SKIN: Skin is warm and dry. No rash noted. No cyanosis. No pallor. No jaundice HEAD: Normocephalic and atraumatic.  EYES: No scleral icterus MOUTH/THROAT: Moist oral membranes.  NECK: No JVD present. No thyromegaly noted. CHEST Normal respiratory effort. No intercostal retractions  LUNGS: Clear to auscultation bilaterally. No stridor. No wheezes. No rales.  CARDIOVASCULAR: Regular, S1-S2, no murmurs, rubs or gallops appreciated. ABDOMINAL: Soft, nontender, nondistended, positive bowel sounds in all 4 quadrants, no apparent ascites.  EXTREMITIES: + trace bilateral peripheral edema,  warm to touch. HEMATOLOGIC: No significant bruising NEUROLOGIC: Oriented to person, place, and time. Nonfocal. Normal muscle tone.  PSYCHIATRIC: Normal mood and affect.  Normal behavior. Cooperative  RADIOLOGY:  MRI brain without contrast: 02/19/2022 1. No acute intracranial abnormality. 2. Moderate chronic small vessel ischemic disease. 3. 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  MRA head without contrast: 02/19/2022 1. Negative intracranial MRA for large vessel occlusion or other emergent finding. 2. Mild intracranial atherosclerotic disease without hemodynamically significant or correctable stenosis. 3. 4 mm saccular left PCOM aneurysm. This would likely be amendable to endovascular treatment. Consultation with neuro interventional radiology suggested.  CARDIAC DATABASE: EKG: 10/31/2021: Probable normal sinus rhythm cannot rule out ectopic atrial rhythm, IVCD suggestive of RBBB, diffuse nonspecific T wave abnormality.  No significant change compared to prior ECG.   01/29/2022: Sinus bradycardia, 48 bpm, first-degree AV block, occasional PVCs.    03/03/22: Atrial fibrillation, 52 bpm, diffuse nonspecific T wave abnormality.  Compared to prior ECG A-fib is new.  04/04/2022: Atrial tachycardia with escape junctional rhythm.  Echocardiogram: 06/18/2021:  Normal LV systolic function with visual EF 50-55%. Left ventricle cavity is normal in size. Mild left ventricular hypertrophy. Normal global wall motion. Doppler evidence of grade II (pseudonormal) diastolic dysfunction,  elevated LAP.  Left atrial cavity is mildly dilated.  Trileaflet aortic valve with no regurgitation. Severe aortic valve leaflet calcification. Moderate aortic valve stenosis. Peak velocity 3.87ms, Peak Gradient 39.8 mmHg, Mean Gradient 27 mmHg, AVA 0.8cm, DI 0.3.  Mild (Grade I) mitral regurgitation.  Moderate tricuspid regurgitation. Mild pulmonary hypertension. RVSP measures 49 mmHg.  Insignificant pericardial effusion. There is no hemodynamic significance.  IVC is dilated with a respiratory response of >50%.  Compared to study 12/06/2020: LVEF was 55-60% and now  50-55%, AS remains moderate, RVSP was 365mg and now 4956m.  10/21/2021:  1. Compared to echo report from September 2022, LVEF is depressed and AS is severe.   2. Global longitudinal strain is -13.4%. Left ventricular ejection fraction, by estimation, is 40%. The left ventricle has moderately decreased function. The left ventricle demonstrates global hypokinesis. The left ventricular internal cavity size was mildly dilated. There is mild left ventricular hypertrophy.   3. Right ventricular systolic function is mildly reduced. The right ventricular size is normal.   4. Left atrial size was moderately dilated.   5. Right atrial size was mildly dilated.   6. Mild mitral valve regurgitation.   7. AV is thickened, calcified with restricted motion motion. Peak and mean gradients through the valve are 48 and 30 mm Hg respectively. AVA (VTI) is 0.99cm2. Dimensionless index is 0.25 All consistent with severe  AS. . AMarland Kitchenrtic valve regurgitation is not  visualized.   8. The inferior vena cava is dilated in size  with <50% respiratory variability, suggesting right atrial pressure of 15 mmHg.  01/22/2022: LVEF 35-40%, global hypokinesis, mild LVH, grade 2 diastolic dysfunction, elevated LVEDP, RV size and function normal, severely elevated PASP, severely dilated left atrium, moderately dilated right atrium, mild MR, 21 9 mm SAPIEN 3 bioprosthetic aortic valve present, peak velocity 1.95 m/s, mean gradient 8.3 mmHg, AVA per VTI 1.97 cm, estimated RAP 8 mmHg.   Stress Testing: Lexiscan Tetrofosmin Stress Test 12/24/2020: Nondiagnostic ECG stress. The left ventricle is dilated in stress images more than rest images, LV end-diastolic volume 283 mL. There is mild diaphragmatic attenuation artifact in the inferior wall. Superimposed on this there is a moderate-sized moderate decrease in counts in the basal inferior and inferolateral wall suggestive of ischemia. Gated SPECT imaging of the left ventricle was abnormal,  demonstrating hypokinesis of the mid inferolateral wall, mid inferior wall and basal inferolateral wall. Stress LV EF is mildly dysfunctional 48%.  No previous exam available for comparison. High risk study.   Left heart catheterization 01/15/2021: LV: Normal LV systolic function.  EDP mildly elevated at 20 mmHg.  There is 22 mmHg peak to peak pressure gradient across the aortic valve.  Findings are consistent with mild aortic stenosis. Very mild coronary calcification and minimal luminal irregularity in the coronary vessels.  Right dominant circulation. Impression: No significant coronary disease angiography.  Coronary calcification is evident.  Normal LVEF and mild aortic stenosis. 50 mL contrast used.  Carotid artery duplex 12/19/2020: Minimal stenosis in the right internal carotid artery (1-15%). Doppler velocity suggests stenosis in the right Minimal stenosis in the left internal carotid artery (1-15%). Doppler velocity suggests stenosis in the left external carotid artery (<50%). Mild heterogeneous plaque noted bilaterally.  Antegrade right vertebral artery flow. Antegrade left vertebral artery flow. External carotid artery stenosis is probably source of bruit. Follow up study is appropriate if clinically indicated.  Renal duplex: 03/23/2022 Renal:  Right: Normal size right kidney. Normal right Resisitive Index. No evidence of right renal artery stenosis. RRV flow present.         Cyst(s) noted.  Left:  Normal size of left kidney. Abnormal left Resisitve Index. 1-59% stenosis of the left renal artery. LRV flow present.         Cyst(s) noted- largest measuring 7.4 x 6.6 x 6.7 cm.  Mesenteric:  70 to 99% stenosis in the superior mesenteric artery.   LABORATORY DATA: External Labs: Collected: 11/29/2020 Creatinine 0.88 mg/dL. eGFR: 86 mL/min per 1.73 m Lipid profile: Total cholesterol 186, triglycerides 131, HDL 39, LDL 116, non HDL 147 Hemoglobin A1c: 7  External Labs: Collected:  04/03/2022 at [facility]. Hemoglobin 9.7 g/dL, hematocrit 30% Sodium 135, potassium four 5.2, chloride 104, bicarb 24, eGFR 57   LABORATORY DATA:    Latest Ref Rng & Units 05/15/2022    2:32 PM 03/26/2022    3:48 AM 03/25/2022    4:09 AM  CBC  WBC 4.0 - 10.5 K/uL  4.4  4.4   Hemoglobin 13.0 - 17.7 g/dL 10.5  9.6  9.3   Hematocrit 37.5 - 51.0 % 31.6  28.6  27.3   Platelets 150 - 400 K/uL  108  102        Latest Ref Rng & Units 05/23/2022   10:54 AM 05/15/2022    2:32 PM 03/26/2022    3:48 AM  CMP  Glucose 70 - 99 mg/dL 130  117  102   BUN 8 - 27 mg/dL 27  23  30   Creatinine 0.76 - 1.27 mg/dL 1.31  1.25  1.50   Sodium 134 - 144 mmol/L 139  140  134   Potassium 3.5 - 5.2 mmol/L 4.0  4.8  4.0   Chloride 96 - 106 mmol/L 102  106  101   CO2 20 - 29 mmol/L '21  21  25   ' Calcium 8.6 - 10.2 mg/dL 8.6  8.9  8.0   Total Protein 6.0 - 8.5 g/dL  6.3    Total Bilirubin 0.0 - 1.2 mg/dL  0.5    Alkaline Phos 44 - 121 IU/L  173    AST 0 - 40 IU/L  20    ALT 0 - 44 IU/L  24      Lipid Panel  Lab Results  Component Value Date   CHOL 125 03/23/2022   HDL 51 03/23/2022   LDLCALC 61 03/23/2022   TRIG 66 03/23/2022   CHOLHDL 2.5 03/23/2022     No components found for: "NTPROBNP" Recent Labs    11/12/21 0937 12/30/21 1205 03/03/22 1151 03/14/22 1250 05/15/22 1432 05/23/22 1057  PROBNP 1,918* 2,011* 4,258* 3,403* 6,876* 6,408*   Recent Labs    03/03/22 1151 03/24/22 1419  TSH 0.602 0.532    BMP Recent Labs    03/24/22 0955 03/25/22 0409 03/26/22 0348 05/15/22 1432 05/23/22 1054  NA 132* 133* 134* 140 139  K 3.3* 3.2* 4.0 4.8 4.0  CL 100 99 101 106 102  CO2 '24 26 25 21 21  ' GLUCOSE 191* 121* 102* 117* 130*  BUN 17 22 30* 23 27  CREATININE 1.21 1.34* 1.50* 1.25 1.31*  CALCIUM 8.2* 7.9* 8.0* 8.9 8.6  GFRNONAA 60* 53* 46*  --   --     HEMOGLOBIN A1C Lab Results  Component Value Date   HGBA1C 6.2 (H) 02/19/2022   MPG 131.24  02/19/2022    Hepatic Function Panel Recent Labs    03/22/22 1200 03/23/22 0442 05/15/22 1432  PROT 6.6 5.8* 6.3  ALBUMIN 3.9 3.2* 4.0  AST '21 18 20  ' ALT '30 22 24  ' ALKPHOS 82 71 173*  BILITOT 0.9 1.2 0.5     IMPRESSION:    ICD-10-CM   1. Nonischemic cardiomyopathy (HCC)  I42.8     2. Chronic HFrEF (heart failure with reduced ejection fraction) (HCC)  I50.22     3. Paroxysmal atrial flutter (HCC)  I48.92     4. Long term (current) use of anticoagulants  Z79.01     5. Resistant hypertension  I10     6. 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 Preprocedural examination  Z01.818     8. S/P TAVR (transcatheter aortic valve replacement)  Z95.2     9. History of stroke  Z86.73     10. Mixed hyperlipidemia  E78.2     11. Type 2 diabetes mellitus with hyperglycemia, without long-term current use of insulin (HCC)  E11.65     12. Former smoker  Z87.891        RECOMMENDATIONS: Nicholas Shelton is a 81 y.o. male whose past medical history and cardiac risk factors include: Hx of aortic stenosis s/p 42m Edward Sapien 3 Valve, heart failure with reduced EF, retinal artery branch occlusion, paroxysmal atrial fibrillation/flutter, saccular left PCOM aneurysm, hypertension, hyperlipidemia, non-insulin-dependent diabetes mellitus type 2, former smoker, advanced age.  Chronic HFrEF (heart failure with reduced ejection fraction) (HCC) / Nonischemic cardiomyopathy (HCC) Clinically euvolemic. Lost approximately 15  pounds with diuresis. Continue Bumex 1 mg p.o. daily. Continue Entresto, Farxiga, Toprol-XL, isosorbide dinitrate, hydralazine, and clonidine. Strict I's and O's and daily weights Low-salt diet  Paroxysmal atrial flutter ([place]) / Long term (current) use of anticoagulants Rate control: Metoprolol. Rhythm control N/A. Thromboembolic prophylaxis: Eliquis Patient does not endorse evidence of bleeding. CHA2DS2-VASc SCORE is 8 which correlates to 6.7% risk of  stroke per year (CHF, HTN, age, DM, history of stroke, vascular disease).   Resistant hypertension Office and home blood pressures have improved.  Has refused to undergo sleep study in the past. 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  On multiple antihypertensive medications as outlined above.   Aneurysm of left internal carotid artery / Preprocedural examination Planning to undergo endovascular intervention w/ Dr. Karenann Cai.  Patient denies any anginal discomfort and is euvolemic from a heart failure standpoint. However, given his comorbidities patient is considered to be at least moderate/high risk for endovascular repair of his left ICA aneurysm from a cardiovascular standpoint but the procedure is not prohibitive.   He is optimized well from a cardiovascular standpoint.   Both patient and sister-in-law verbalized his cardiovascular risk for upcoming procedure advised acceptable.   Would recommend the procedure to be done inpatient with close monitoring of blood pressure preferentially A-line if possible.   S/P TAVR (transcatheter aortic valve replacement) S/p  46m Edward Sapien 3 Valve w/ Left subclavian approach in Feb 2023. Last echo March 2023.  Patient aware of antibiotic prophylaxis prior to dental procedures.  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  Educated on importance of secondary prevention.  Mixed hyperlipidemia Continue statin therapy. Initial LDL was 165 mg/dL as of April 2023 and now less than 70 mg/dL. Does not endorse myalgias.  Type 2 diabetes mellitus with hyperglycemia, without long-term current use of insulin ([place] Emphasized importance of glycemic control. Currently managed by primary care provider.  FINAL MEDICATION LIST END OF ENCOUNTER: No orders of the defined types were placed in this encounter.    Current Outpatient  Medications:    acetaminophen (TYLENOL) 500 MG tablet, Take 500 mg by mouth every 8 (eight) hours as needed for moderate pain., Disp: , Rfl:    apixaban (ELIQUIS) 5 MG TABS tablet, Take 1 tablet (5 mg total) by mouth 2 (two) times daily., Disp: 180 tablet, Rfl: 3   atorvastatin (LIPITOR) 10 MG tablet, Take 10 mg by mouth daily., Disp: , Rfl:    bumetanide (BUMEX) 1 MG tablet, Take 1 tablet (1 mg total) by mouth every morning., Disp: 90 tablet, Rfl: 0   cloNIDine (CATAPRES) 0.3 MG tablet, Take 1 tablet (0.3 mg total) by mouth 3 (three) times daily., Disp: 270 tablet, Rfl: 0   clopidogrel (PLAVIX) 75 MG tablet, Take 1 tablet (75 mg total) by mouth daily., Disp: 30 tablet, Rfl: 1   dapagliflozin propanediol (FARXIGA) 10 MG TABS tablet, Take 1 tablet (10 mg total) by mouth daily., Disp: 90 tablet, Rfl: 3   hydrALAZINE (APRESOLINE) 100 MG tablet, Take 1 tablet (100 mg total) by mouth 3 (three) times daily., Disp: 270 tablet, Rfl: 2   isosorbide dinitrate (ISORDIL) 20 MG tablet, TAKE 2 TABLETS BY MOUTH THREE TIMES DAILY, Disp: 540 tablet, Rfl: 0   metFORMIN (GLUCOPHAGE) 1000 MG tablet, Take 1 tablet (1,000 mg total) by mouth 2 (two) times daily with a meal., Disp: , Rfl:    metoprolol succinate (  TOPROL-XL) 25 MG 24 hr tablet, Take 1 tablet (25 mg total) by mouth daily., Disp: 90 tablet, Rfl: 3   Multiple Vitamin (MULTI VITAMIN MENS PO), Take by mouth., Disp: , Rfl:    pantoprazole (PROTONIX) 40 MG tablet, Take 40 mg by mouth daily before breakfast., Disp: , Rfl:    Psyllium 28.3 % POWD, Take by mouth., Disp: , Rfl:    sacubitril-valsartan (ENTRESTO) 97-103 MG, Take 1 tablet by mouth 2 (two) times daily., Disp: 60 tablet, Rfl: 1  No orders of the defined types were placed in this encounter.  There are no Patient Instructions on file for this visit.   --Continue cardiac medications as reconciled in final medication list. --Return in about 6 weeks (around 07/10/2022) for Follow up, heart failure  management., BP. Or sooner if needed. --Continue follow-up with your primary care physician regarding the management of your other chronic comorbid conditions.  Patient's questions and concerns were addressed to his satisfaction. He voices understanding of the instructions provided during this encounter.   This note was created using a voice recognition software as a result there may be grammatical errors inadvertently enclosed that do not reflect the nature of this encounter. Every attempt is made to correct such errors.  Rex Kras, [place], [facility]  Pager: [phone] Office: [phone]

## 2022-05-30 ENCOUNTER — Other Ambulatory Visit (HOSPITAL_COMMUNITY): Payer: Self-pay | Admitting: Neuroradiology

## 2022-05-30 DIAGNOSIS — I671 Cerebral aneurysm, nonruptured: Secondary | ICD-10-CM

## 2022-06-02 ENCOUNTER — Ambulatory Visit (HOSPITAL_COMMUNITY)
Admission: RE | Admit: 2022-06-02 | Discharge: 2022-06-02 | Disposition: A | Discharge status: Home / Self Care | Payer: Medicare Other | Source: Ambulatory Visit | Attending: Neuroradiology | Admitting: Neuroradiology

## 2022-06-02 DIAGNOSIS — I671 Cerebral aneurysm, nonruptured: Secondary | ICD-10-CM

## 2022-06-03 NOTE — Progress Notes (Signed)
INTERVENTIONAL NEURORADIOLOGY  Progress note  Mr. Nicholas Shelton came today as he would like to ask additional questions prior to deciding on intervention.  He has been optimized from the cardiac stand point and I am in contact with Dr. Terri Skains with regards to perioperative medication management.   Mr. Nicholas Shelton wanted to know if we could repeat his MRI as he was hoping the aneurysm could have shrunk and if we could try some medication to make the aneurysm go away. I explained to him that it is extremely unlikely that the aneurysm decreased in size or disappeared on its own, particularly given that he is on Eliquis, making the possibility of spontaneous thrombosis very improbable. Currently, we do no have a medication available in the market capable of making the aneurysm regress.   Another question that he had was how the procedure is done and if opening up the skull was necessary. I explained to him that while one of the possible ways of treating aneurysms is open neurosurgical clipping, the treatment proposed is completely performed endovascularly. He informed me he would not like to undergo open surgery. Other option discussed was medical management.  Mr. Nicholas Shelton informed me that his children would like him to do medical management and avoid surgery. I explained to him that this is a reasonable option and discussed por and cons of this strategy. However, he says he is very worried and anxious about the aneurysm and what could happen if left untreated. He would prefer to have his aneurysm treated sooner rather than later.  After discussion with Mr. Nicholas Shelton and Nicholas Shelton, his sister in law, who has been helping him with his medical care, decision was made to proceed with intervention. Our schedulers will reach out to him to book his intervention under general anesthesia.  [name], MD Interventional Neuroradiology   I spent a total of    20 minutes  in face to face in clinical consultation, greater than  50% of which was counseling/coordinating care for brain aneurysm.

## 2022-06-09 ENCOUNTER — Other Ambulatory Visit: Payer: Self-pay | Admitting: Cardiology

## 2022-06-09 DIAGNOSIS — Z7901 Long term (current) use of anticoagulants: Secondary | ICD-10-CM

## 2022-06-09 DIAGNOSIS — I4891 Unspecified atrial fibrillation: Secondary | ICD-10-CM

## 2022-06-12 ENCOUNTER — Other Ambulatory Visit (HOSPITAL_COMMUNITY): Payer: Self-pay | Admitting: Neuroradiology

## 2022-06-12 DIAGNOSIS — I671 Cerebral aneurysm, nonruptured: Secondary | ICD-10-CM

## 2022-06-16 ENCOUNTER — Other Ambulatory Visit: Payer: Self-pay | Admitting: Cardiology

## 2022-06-17 ENCOUNTER — Other Ambulatory Visit: Payer: Self-pay

## 2022-06-17 DIAGNOSIS — I4891 Unspecified atrial fibrillation: Secondary | ICD-10-CM

## 2022-06-17 DIAGNOSIS — Z7901 Long term (current) use of anticoagulants: Secondary | ICD-10-CM

## 2022-06-17 MED ORDER — APIXABAN 5 MG PO TABS: 5.0000 mg | ORAL_TABLET | Freq: Two times a day (BID) | ORAL | 3 refills | Status: DC

## 2022-06-25 ENCOUNTER — Other Ambulatory Visit: Payer: Self-pay | Admitting: Radiology

## 2022-06-25 DIAGNOSIS — I671 Cerebral aneurysm, nonruptured: Secondary | ICD-10-CM

## 2022-06-25 NOTE — Progress Notes (Signed)
Attempted to call and give pre-op instructions, but the pt and wife informed that the procedure has been rescheduled until Mon 8/21,  due to the pt needing to stop taking Brillinta. They would like a call back later in the week to go over the instructions.

## 2022-06-26 ENCOUNTER — Other Ambulatory Visit: Payer: Self-pay | Admitting: Radiology

## 2022-06-26 ENCOUNTER — Inpatient Hospital Stay (HOSPITAL_COMMUNITY): Admission: RE | Admit: 2022-06-26 | Payer: Medicare Other | Source: Ambulatory Visit

## 2022-06-27 ENCOUNTER — Other Ambulatory Visit: Payer: Self-pay

## 2022-06-27 ENCOUNTER — Other Ambulatory Visit (HOSPITAL_COMMUNITY): Payer: Self-pay | Admitting: Radiology

## 2022-06-27 ENCOUNTER — Encounter (HOSPITAL_COMMUNITY): Payer: Self-pay | Admitting: *Deleted

## 2022-06-27 NOTE — Progress Notes (Signed)
Per Abigail Butts, [place], patient is to continue his Aspirin and Plavix and take the morning of the procedure.

## 2022-06-27 NOTE — Progress Notes (Addendum)
Patient's wife was called with instructions and questions for surgery day. Wife asked this Probation officer to call patient's sister-in-law , Axil Copeman, because she is taking care of both, the patient and his wife. Mrs. Dashel Goines was called with questions and instruction for surgery day. All questions were answered and Mrs. Kaufhold verbalized understanding of instructions.   PCP - Woody Seller, MD Cardiologist - Rex Kras, MD  PPM/ICD - denies  Chest x-ray - 03/22/22 EKG - 04/24/22 ECHO - 03/24/22 Cardiac Cath - 12/24/21  CPAP - n/a  Fasting Blood Sugar - patient's sister-in-law verbalized that the patient is checking CBG once a day, at different times of the day. She doesn't know for sure in what range is patient's CBG  Blood Thinner Instructions: Brillinta - last dose - 06/25/22;                                              Eliquis - stopped 5 days prior surgery;                                              Plavis - last dose - will not stop taking                                              Aspirin Instructions: will not stop taking  Patient was instructed: As of today, STOP taking any Aspirin (unless otherwise instructed by your surgeon) Aleve, Naproxen, Ibuprofen, Motrin, Advil, Goody's, BC's, all herbal medications, fish oil, and all vitamins.  ERAS Protcol - n/a  COVID TEST- n/a  Anesthesia review: yes - cardiac history  Patient verbally denies any shortness of breath, fever, cough and chest pain during phone call   -------------  SDW INSTRUCTIONS given:  Your procedure is scheduled on Monday, August 21st, 2023.  Report to [facility] Main Entrance "A" at 06:00 A.M., and check in at the Admitting office.  Call this number if you have problems the morning of surgery:  [phone]   Remember:  Do not eat or drink after midnight the night before your surgery    Take these medicines the morning of surgery with A SIP OF WATER Lipitor, Clonidine, Hydralazine, Isordil, Toprol,  Protonix, Aspirin, Plavix   Do not take dapagliflozin propanediol (FARXIGA) and metFORMIN (GLUCOPHAGE) the morning of surgery.   How do I manage my blood sugar before surgery? Check your blood sugar at least 4 times a day, starting 2 days before surgery, to make sure that the level is not too high or low.  Check your blood sugar the morning of your surgery when you wake up and every 2 hours until you get to the Short Stay unit.  If your blood sugar is less than 70 mg/dL, you will need to treat for low blood sugar: Do not take insulin. Treat a low blood sugar (less than 70 mg/dL) with  cup of clear juice (cranberry or apple), 4 glucose tablets, OR glucose gel. Recheck blood sugar in 15 minutes after treatment (to make sure it is greater than 70 mg/dL). If your blood sugar is not greater than 70 mg/dL  on recheck, call [phone] for further instructions. Report your blood sugar to the short stay nurse when you get to Short Stay.  If you are admitted to the hospital after surgery: Your blood sugar will be checked by the staff and you will probably be given insulin after surgery (instead of oral diabetes medicines) to make sure you have good blood sugar levels. The goal for blood sugar control after surgery is 80-180 mg/dL.    The day of surgery:                     Do not wear jewelry            Do not wear lotions, powders, colognes, or deodorant.            Men may shave face and neck.            Do not bring valuables to the hospital.            [facility] is not responsible for any belongings or valuables.  Do NOT Smoke (Tobacco/Vaping) 24 hours prior to your procedure If you use a CPAP at night, you may bring all equipment for your overnight stay.   Contacts, glasses, dentures or bridgework may not be worn into surgery.      For patients admitted to the hospital, discharge time will be determined by your treatment team.   Patients discharged the day of surgery will not be  allowed to drive home, and someone needs to stay with them for 24 hours.    Special instructions:   [place]- Preparing For Surgery  Before surgery, you can play an important role. Because skin is not sterile, your skin needs to be as free of germs as possible. You can reduce the number of germs on your skin by washing with CHG (chlorahexidine gluconate) Soap before surgery.  CHG is an antiseptic cleaner which kills germs and bonds with the skin to continue killing germs even after washing.    Oral Hygiene is also important to reduce your risk of infection.  Remember - BRUSH YOUR TEETH THE MORNING OF SURGERY WITH YOUR REGULAR TOOTHPASTE  Please do not use if you have an allergy to CHG or antibacterial soaps. If your skin becomes reddened/irritated stop using the CHG.  Do not shave (including legs and underarms) for at least 48 hours prior to first CHG shower. It is OK to shave your face.  Please follow these instructions carefully.   Shower the NIGHT BEFORE SURGERY and the MORNING OF SURGERY with DIAL Soap.   Pat yourself dry with a CLEAN TOWEL.  Wear CLEAN PAJAMAS to bed the night before surgery  Place CLEAN SHEETS on your bed the night of your first shower and DO NOT SLEEP WITH PETS.   Day of Surgery: Please shower morning of surgery  Wear Clean/Comfortable clothing the morning of surgery Do not apply any deodorants/lotions.   Remember to brush your teeth WITH YOUR REGULAR TOOTHPASTE.   Questions were answered. Patient verbalized understanding of instructions.

## 2022-06-27 NOTE — Progress Notes (Signed)
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    Last cardiology visit with Dr. TTerri Skainswas on 05/29/22. He notes HTN has been difficult to control despite multiple antihypertensive medications.  He did seem to have some improvement with addition of clonidine with reported home SBP ~ 131-145, and may have a component of white coat hypertension. < 60% left RAS by 03/2022 [place] He declined a sleep study. Volume status improved with Bumex. PAF rate controlled and tolerating Eliquis. In regards to planned intervention for PCOM aneurysm: "Aneurysm of left internal carotid artery / Preprocedural examination Planning to undergo endovascular intervention w/ Dr. DKarenann Cai  Patient denies any anginal discomfort and is euvolemic from a heart failure standpoint. However, given his comorbidities patient is considered to be at least moderate/high risk for endovascular repair of his left ICA aneurysm  from a cardiovascular standpoint but the procedure is not prohibitive.   He is optimized well from a cardiovascular standpoint.   Both patient and sister-in-law verbalized his cardiovascular risk for upcoming procedure advised acceptable.   Would recommend the procedure to be done inpatient with close monitoring of blood pressure preferentially A-line if possible."  Per 06/02/22 Progress Note by Dr. DKarenann Cai she had been in contact with Dr TTerri Skainsregarding perioperative medication management. Reportedly instructions were: Brillinta - last dose - 06/25/22;  Eliquis - stopped 5 days prior surgery;  Plavix - last dose - will not stop taking  Aspirin Instructions: will not stop taking  He is to continue ASA and Plavix.   He is a same day work-up, so anesthesia team to evaluate on the day of procedure.   VS:  BP Readings from Last 3 Encounters:  05/29/22 (!) 166/70  05/15/22 (!) 178/81  04/24/22 (!) 190/90   Pulse Readings from Last 3 Encounters:  05/29/22 (!) 56  05/15/22 (!) 55  04/24/22 60     PROVIDERS: WChristain Sacramento MD is PCP  - TRex Kras MD is cardiologist - CSherren Mocha MD is structural heart cardiologist - LLars Mage MD is EP. Last visit 04/24/22 for conduction disease. No symptoms then related to bradycardia. He has appropriate chronotropic response with exertion (HR briskly up to 100 bpm during walk test and returned to 60s with rest). No indication for PPM at that time, and as needed follow-up recommended.    LABS: [facility] day of procedure as indicated. [facility] Results  Component Value Date   WBC 4.4 03/26/2022  HGB 10.5 (L) 05/15/2022   HCT 31.6 (L) 05/15/2022   PLT 108 (L) 03/26/2022   GLUCOSE 130 (H) 05/23/2022   ALT 24 05/15/2022   AST 20 05/15/2022   NA 139 05/23/2022   K 4.0 05/23/2022   CL 102 05/23/2022   CREATININE 1.31 (H) 05/23/2022   BUN 27 05/23/2022   CO2 21 05/23/2022   TSH 0.532 03/24/2022   INR  1.0 02/19/2022   HGBA1C 6.2 (H) 02/19/2022    IMAGES: CXR 03/22/2022: FINDINGS: 1340 hours. Low volume film. The cardio pericardial silhouette is enlarged. Status post TAVR. Basilar atelectasis with possible trace right pleural effusion. Vascular congestion evident without overt airspace pulmonary edema. Bones are diffusely demineralized. IMPRESSION: Low volume film with basilar atelectasis and possible trace right pleural effusion.  MRI brain w/o contrast 02/19/2022: IMPRESSION: 1. No acute intracranial abnormality. 2. Moderate chronic small vessel ischemic disease. 3. 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   MRA head w/o contrast 02/19/2022: IMPRESSION: 1. Negative intracranial MRA for large vessel occlusion or other emergent finding. 2. Mild intracranial atherosclerotic disease without hemodynamically significant or correctable stenosis. 3. 4 mm saccular left PCOM aneurysm. This would likely be amendable to endovascular treatment. Consultation with neuro interventional radiology suggested.      EKG: EKG 04/24/2022 ([facility]): "atrial fibrillation/flutter with some RR variability and a ventricular rate of 60 bpm."  EKG 04/04/2022: Atrial tachycardia with escape junctional rhythm.     CV: Renal duplex 03/23/2022: Renal:  Right: Normal size right kidney. Normal right Resisitive Index. No evidence of right renal artery stenosis. RRV flow present.         Cyst(s) noted.  Left:  Normal size of left kidney. Abnormal left Resisitve Index. 1-59% stenosis of the left renal artery. LRV flow present.         Cyst(s) noted- largest measuring 7.4 x 6.6 x 6.7 cm.  Mesenteric:  70 to 99% stenosis in the superior mesenteric artery.     Long Term Cardiac monitor (Zio Patch) 03/03/2022-03/17/2022: Patch Wear Time:  14 days and 0 hours  Dominant rhythm atrial flutter with variable conduction. Heart rate 32-99 bpm.  Avg HR 47 bpm. No ventricular tachycardia, high grade  AV block, pauses (3 seconds or longer). Total ventricular ectopic burden <1%. Total supraventricular ectopic burden <1%. Patient triggered events: 0.    US Carotid 02/20/2022: Summary:  - Right Carotid: Velocities in the right ICA are consistent with a 1-39%  stenosis.  - Left Carotid: Velocities in the left ICA are consistent with a 1-39%  stenosis.  - Vertebrals:  Bilateral vertebral arteries demonstrate antegrade flow.  - Subclavians: Normal flow hemodynamics were seen in bilateral subclavian arteries.    Echo 01/22/2022: IMPRESSIONS: 1. Left ventricular ejection fraction, by estimation, is 35 to 40%. The  left ventricle has moderately decreased function. The left ventricle  demonstrates global hypokinesis. There is mild left ventricular  hypertrophy of the septal segment. Left  ventricular diastolic parameters are consistent with Grade II diastolic  dysfunction (pseudonormalization). Elevated left ventricular end-diastolic  pressure.   2. Right ventricular systolic function is normal. The right ventricular  size is normal. There is severely elevated pulmonary artery systolic  pressure.   3. Left atrial size was severely dilated.   4. Right atrial size was moderately dilated.   5. The mitral valve is degenerative. Mild mitral valve regurgitation. No  evidence of mitral stenosis.   6. The aortic valve has been repaired/replaced. Aortic valve  regurgitation is not  visualized. No aortic stenosis is present. There is a  29 mm Sapien prosthetic (TAVR) valve present in the aortic position.  Procedure Date: 12/24/21. Echo findings are  consistent with normal structure and function of the aortic valve  prosthesis. Aortic valve area, by VTI measures 1.97 cm. Aortic valve mean  gradient measures 8.3 mmHg. Aortic valve Vmax measures 1.95 m/s.   7. The inferior vena cava is dilated in size with >50% respiratory  variability, suggesting right atrial pressure of 8 mmHg.  - Comparison(s):  12/25/21 EF 40-45%. AV 86mHg mean PG, 150mg peak PG.     Left heart catheterization 01/15/2021 (Pre-TAVR): LV: Normal LV systolic function.  EDP mildly elevated at 20 mmHg.  There is 22 mmHg peak to peak pressure gradient across the aortic valve.  Findings are consistent with mild aortic stenosis. Very mild coronary calcification and minimal luminal irregularity in the coronary vessels.  Right dominant circulation. - Impression: No significant coronary disease angiography.  Coronary calcification is evident.  Normal LVEF and mild aortic stenosis. 50 mL contrast used.     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   Degeneration of lumbar or lumbosacral intervertebral disc    Deviated nasal septum    Diabetes mellitus without complication (HCC)    Gastroesophageal reflux disease without esophagitis    Generalized anxiety disorder    Gout with tophus    Hearing loss    Hyperlipidemia    Hypertension    Insomnia    Murmur    Aortic stenosis   Nasal turbinate hypertrophy    Nonischemic cardiomyopathy (HCC)    Osteoarthritis of wrist    PAC (premature atrial contraction)    PAF (paroxysmal atrial fibrillation) ([facility]/24/2023   Peptic ulcer    Peripheral neuropathy    PVC (premature ventricular contraction)    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    Past Surgical History:  Procedure Laterality Date   CARDIAC CATHETERIZATION     COLONOSCOPY     ELBOW SURGERY     pinched nerve on both arms   INGUINAL HERNIA REPAIR Right 10/30/2018   Procedure: OPEN REPAIR OF INCARCERATED RIGHT INGUINAL HERNIA WITH MESH;  Surgeon: WilGreer PickerelD;  [facility] [place] Service: General;  Laterality: Right;   JOINT REPLACEMENT     LEFT HEART CATH AND CORONARY ANGIOGRAPHY N/A 01/15/2021   Procedure: LEFT HEART CATH AND CORONARY ANGIOGRAPHY;  Surgeon: GanAdrian ProwsD;  Location: [facility] [place] [facility];  Service: Cardiovascular;  Laterality: N/A;   TEE WITHOUT CARDIOVERSION N/A 12/24/2021   Procedure: TRANSESOPHAGEAL ECHOCARDIOGRAM (TEE);  Surgeon: CooSherren MochaD;  [facility] [place];  Laterality: N/A;   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   Procedure: TOTAL KNEE ARTHROPLASTY;  Surgeon: ColSydnee CabalD;  Location: [facility];  Service: Orthopedics;  Laterality: Right;  with adductor canal   WRIST SURGERY     pinched nerve    MEDICATIONS: No current facility-administered medications for this encounter.    acetaminophen (TYLENOL) 500 MG tablet   apixaban (ELIQUIS) 5 MG TABS tablet   aspirin  EC 81 MG tablet   atorvastatin (LIPITOR) 10 MG tablet   bumetanide (BUMEX) 1 MG tablet   cloNIDine (CATAPRES) 0.3 MG tablet   clopidogrel (PLAVIX) 75 MG tablet   dapagliflozin propanediol (FARXIGA) 10 MG TABS tablet   hydrALAZINE (APRESOLINE) 100 MG tablet   isosorbide dinitrate (ISORDIL) 20 MG tablet   metFORMIN (GLUCOPHAGE) 1000 MG tablet   metoprolol succinate (TOPROL-XL) 25 MG 24 hr tablet   Multiple Vitamin (MULTI VITAMIN MENS PO)   pantoprazole (PROTONIX) 40 MG tablet   Psyllium (METAMUCIL PO)   ticagrelor (BRILINTA) 90 MG TABS tablet    Myra Gianotti, PA-C Surgical Short Stay/Anesthesiology [facility] Phone [phone] [facility] Phone [phone] 06/27/2022 3:55 PM

## 2022-06-27 NOTE — Anesthesia Preprocedure Evaluation (Signed)
Anesthesia Evaluation  Patient identified by MRN, date of birth, ID band Patient awake    Reviewed: Allergy & Precautions, NPO status , Patient's Chart, lab work & pertinent test results  History of Anesthesia Complications Negative for: history of anesthetic complications  Airway Mallampati: II  TM Distance: >3 FB Neck ROM: Full    Dental  (+) Edentulous Upper, Edentulous Lower, Dental Advisory Given   Pulmonary neg shortness of breath, neg sleep apnea, neg COPD, neg recent URI, former smoker,    breath sounds clear to auscultation       Cardiovascular hypertension, Pt. on medications and Pt. on home beta blockers (-) angina+CHF  + dysrhythmias Atrial Fibrillation + Valvular Problems/Murmurs  Rhythm:Irregular  1. Left ventricular ejection fraction, by estimation, is 35 to 40%. The  left ventricle has moderately decreased function. The left ventricle  demonstrates global hypokinesis. There is mild left ventricular  hypertrophy of the septal segment. Left  ventricular diastolic parameters are consistent with Grade II diastolic  dysfunction (pseudonormalization). Elevated left ventricular end-diastolic  pressure.  2. Right ventricular systolic function is normal. The right ventricular  size is normal. There is severely elevated pulmonary artery systolic  pressure.  3. Left atrial size was severely dilated.  4. Right atrial size was moderately dilated.  5. The mitral valve is degenerative. Mild mitral valve regurgitation. No  evidence of mitral stenosis.  6. The aortic valve has been repaired/replaced. Aortic valve  regurgitation is not visualized. No aortic stenosis is present. There is a  29 mm Sapien prosthetic (TAVR) valve present in the aortic position.  Procedure Date: 12/24/21. Echo findings are  consistent with normal structure and function of the aortic valve  prosthesis. Aortic valve area, by VTI measures 1.97 cm.  Aortic valve mean  gradient measures 8.3 mmHg. Aortic valve Vmax measures 1.95 m/s.  7. The inferior vena cava is dilated in size with >50% respiratory  variability, suggesting right atrial pressure of 8 mmHg.    Neuro/Psych PSYCHIATRIC DISORDERS Anxiety  Neuromuscular disease CVA    GI/Hepatic Neg liver ROS, PUD, GERD  Medicated and Controlled,  Endo/Other  diabetesLab Results      Component                Value               Date                      HGBA1C                   6.2 (H)             02/19/2022             Renal/GU CRFRenal diseaseLab Results      Component                Value               Date                      CREATININE               1.49 (H)            06/30/2022                Musculoskeletal  (+) Arthritis ,   Abdominal   Peds  Hematology  (+) Blood dyscrasia, anemia , Lab Results  Component                Value               Date                      WBC                      3.8 (L)             06/30/2022                HGB                      10.1 (L)            06/30/2022                HCT                      32.7 (L)            06/30/2022                MCV                      92.9                06/30/2022                PLT                      92 (L)              06/30/2022              Anesthesia Other Findings   Reproductive/Obstetrics                           Anesthesia Physical Anesthesia Plan  ASA: 3  Anesthesia Plan: General   Post-op Pain Management: Ofirmev IV (intra-op)*   Induction: Intravenous  PONV Risk Score and Plan: 2 and Ondansetron, Dexamethasone and Propofol infusion  Airway Management Planned: Oral ETT  Additional Equipment: Arterial line  Intra-op Plan:   Post-operative Plan: Extubation in OR and Possible Post-op intubation/ventilation  Informed Consent: I have reviewed the patients History and Physical, chart, labs and discussed the procedure including the risks, benefits  and alternatives for the proposed anesthesia with the patient or authorized representative who has indicated his/her understanding and acceptance.     Dental advisory given  Plan Discussed with: CRNA  Anesthesia Plan Comments: (PAT note written 06/27/2022 by Myra Gianotti, PA-C. )      Anesthesia Quick Evaluation

## 2022-06-30 ENCOUNTER — Inpatient Hospital Stay (HOSPITAL_COMMUNITY)
Admission: RE | Admit: 2022-06-30 | Discharge: 2022-06-30 | Disposition: A | Discharge status: Home / Self Care | Payer: Medicare Other | Source: Ambulatory Visit | Attending: Neuroradiology | Admitting: Neuroradiology

## 2022-06-30 ENCOUNTER — Encounter (HOSPITAL_COMMUNITY): Payer: Self-pay

## 2022-06-30 ENCOUNTER — Inpatient Hospital Stay (HOSPITAL_COMMUNITY): Payer: Medicare Other | Admitting: Physician Assistant

## 2022-06-30 ENCOUNTER — Encounter (HOSPITAL_COMMUNITY)
Admission: RE | Disposition: A | Discharge status: Home / Self Care | Payer: Self-pay | Source: Home / Self Care | Attending: Neuroradiology

## 2022-06-30 ENCOUNTER — Inpatient Hospital Stay (HOSPITAL_COMMUNITY)
Admission: RE | Admit: 2022-06-30 | Discharge: 2022-07-02 | DRG: 026 | Disposition: A | Discharge status: Home / Self Care | Payer: Medicare Other | Attending: Neuroradiology | Admitting: Neuroradiology

## 2022-06-30 ENCOUNTER — Other Ambulatory Visit: Payer: Self-pay | Admitting: Internal Medicine

## 2022-06-30 DIAGNOSIS — Z7902 Long term (current) use of antithrombotics/antiplatelets: Secondary | ICD-10-CM | POA: Diagnosis not present

## 2022-06-30 DIAGNOSIS — I10 Essential (primary) hypertension: Secondary | ICD-10-CM | POA: Diagnosis present

## 2022-06-30 DIAGNOSIS — N189 Chronic kidney disease, unspecified: Secondary | ICD-10-CM | POA: Diagnosis not present

## 2022-06-30 DIAGNOSIS — F411 Generalized anxiety disorder: Secondary | ICD-10-CM | POA: Diagnosis present

## 2022-06-30 DIAGNOSIS — G4700 Insomnia, unspecified: Secondary | ICD-10-CM | POA: Diagnosis present

## 2022-06-30 DIAGNOSIS — Z8249 Family history of ischemic heart disease and other diseases of the circulatory system: Secondary | ICD-10-CM | POA: Diagnosis not present

## 2022-06-30 DIAGNOSIS — I2510 Atherosclerotic heart disease of native coronary artery without angina pectoris: Secondary | ICD-10-CM | POA: Diagnosis present

## 2022-06-30 DIAGNOSIS — I350 Nonrheumatic aortic (valve) stenosis: Secondary | ICD-10-CM | POA: Diagnosis present

## 2022-06-30 DIAGNOSIS — Z87891 Personal history of nicotine dependence: Secondary | ICD-10-CM

## 2022-06-30 DIAGNOSIS — Z96651 Presence of right artificial knee joint: Secondary | ICD-10-CM | POA: Diagnosis present

## 2022-06-30 DIAGNOSIS — Z7982 Long term (current) use of aspirin: Secondary | ICD-10-CM

## 2022-06-30 DIAGNOSIS — K219 Gastro-esophageal reflux disease without esophagitis: Secondary | ICD-10-CM | POA: Diagnosis present

## 2022-06-30 DIAGNOSIS — I509 Heart failure, unspecified: Secondary | ICD-10-CM

## 2022-06-30 DIAGNOSIS — Z79899 Other long term (current) drug therapy: Secondary | ICD-10-CM

## 2022-06-30 DIAGNOSIS — Z8673 Personal history of transient ischemic attack (TIA), and cerebral infarction without residual deficits: Secondary | ICD-10-CM | POA: Diagnosis not present

## 2022-06-30 DIAGNOSIS — I130 Hypertensive heart and chronic kidney disease with heart failure and stage 1 through stage 4 chronic kidney disease, or unspecified chronic kidney disease: Secondary | ICD-10-CM | POA: Diagnosis not present

## 2022-06-30 DIAGNOSIS — H9190 Unspecified hearing loss, unspecified ear: Secondary | ICD-10-CM | POA: Diagnosis present

## 2022-06-30 DIAGNOSIS — I671 Cerebral aneurysm, nonruptured: Secondary | ICD-10-CM

## 2022-06-30 DIAGNOSIS — I720 Aneurysm of carotid artery: Secondary | ICD-10-CM

## 2022-06-30 DIAGNOSIS — E785 Hyperlipidemia, unspecified: Secondary | ICD-10-CM | POA: Diagnosis present

## 2022-06-30 DIAGNOSIS — I472 Ventricular tachycardia, unspecified: Secondary | ICD-10-CM | POA: Diagnosis not present

## 2022-06-30 DIAGNOSIS — E119 Type 2 diabetes mellitus without complications: Secondary | ICD-10-CM | POA: Diagnosis present

## 2022-06-30 DIAGNOSIS — Z7901 Long term (current) use of anticoagulants: Secondary | ICD-10-CM | POA: Diagnosis not present

## 2022-06-30 DIAGNOSIS — Z7984 Long term (current) use of oral hypoglycemic drugs: Secondary | ICD-10-CM

## 2022-06-30 DIAGNOSIS — I480 Paroxysmal atrial fibrillation: Secondary | ICD-10-CM | POA: Diagnosis present

## 2022-06-30 DIAGNOSIS — E1122 Type 2 diabetes mellitus with diabetic chronic kidney disease: Secondary | ICD-10-CM | POA: Diagnosis not present

## 2022-06-30 DIAGNOSIS — Z953 Presence of xenogenic heart valve: Secondary | ICD-10-CM | POA: Diagnosis not present

## 2022-06-30 HISTORY — PX: IR TRANSCATH/EMBOLIZ: IMG695

## 2022-06-30 HISTORY — PX: IR ANGIO INTRA EXTRACRAN SEL INTERNAL CAROTID UNI L MOD SED: IMG5361

## 2022-06-30 HISTORY — DX: Other cardiomyopathies: I42.8

## 2022-06-30 HISTORY — PX: RADIOLOGY WITH ANESTHESIA: SHX6223

## 2022-06-30 HISTORY — PX: IR US GUIDE VASC ACCESS RIGHT: IMG2390

## 2022-06-30 HISTORY — PX: IR ANGIO VERTEBRAL SEL VERTEBRAL UNI L MOD SED: IMG5367

## 2022-06-30 HISTORY — PX: IR ANGIO INTRA EXTRACRAN SEL COM CAROTID INNOMINATE UNI R MOD SED: IMG5359

## 2022-06-30 HISTORY — PX: IR 3D INDEPENDENT WKST: IMG2385

## 2022-06-30 HISTORY — DX: Cerebral infarction, unspecified: I63.9

## 2022-06-30 HISTORY — PX: IR CT HEAD LTD: IMG2386

## 2022-06-30 LAB — CBC WITH DIFFERENTIAL/PLATELET
Abs Immature Granulocytes: 0.01 10*3/uL (ref 0.00–0.07)
Basophils Absolute: 0 10*3/uL (ref 0.0–0.1)
Basophils Relative: 1 %
Eosinophils Absolute: 0.1 10*3/uL (ref 0.0–0.5)
Eosinophils Relative: 2 %
HCT: 32.7 % — ABNORMAL LOW (ref 39.0–52.0)
Hemoglobin: 10.1 g/dL — ABNORMAL LOW (ref 13.0–17.0)
Immature Granulocytes: 0 %
Lymphocytes Relative: 18 %
Lymphs Abs: 0.7 10*3/uL (ref 0.7–4.0)
MCH: 28.7 pg (ref 26.0–34.0)
MCHC: 30.9 g/dL (ref 30.0–36.0)
MCV: 92.9 fL (ref 80.0–100.0)
Monocytes Absolute: 0.3 10*3/uL (ref 0.1–1.0)
Monocytes Relative: 8 %
Neutro Abs: 2.7 10*3/uL (ref 1.7–7.7)
Neutrophils Relative %: 71 %
Platelets: 92 10*3/uL — ABNORMAL LOW (ref 150–400)
RBC: 3.52 MIL/uL — ABNORMAL LOW (ref 4.22–5.81)
RDW: 13.8 % (ref 11.5–15.5)
WBC: 3.8 10*3/uL — ABNORMAL LOW (ref 4.0–10.5)
nRBC: 0 % (ref 0.0–0.2)

## 2022-06-30 LAB — PROTIME-INR
INR: 1.1 (ref 0.8–1.2)
Prothrombin Time: 14.3 seconds (ref 11.4–15.2)

## 2022-06-30 LAB — BASIC METABOLIC PANEL
Anion gap: 13 (ref 5–15)
BUN: 45 mg/dL — ABNORMAL HIGH (ref 8–23)
CO2: 20 mmol/L — ABNORMAL LOW (ref 22–32)
Calcium: 8.8 mg/dL — ABNORMAL LOW (ref 8.9–10.3)
Chloride: 104 mmol/L (ref 98–111)
Creatinine, Ser: 1.49 mg/dL — ABNORMAL HIGH (ref 0.61–1.24)
GFR, Estimated: 47 mL/min — ABNORMAL LOW (ref >60)
Glucose, Bld: 124 mg/dL — ABNORMAL HIGH (ref 70–99)
Potassium: 4.5 mmol/L (ref 3.5–5.1)
Sodium: 137 mmol/L (ref 135–145)

## 2022-06-30 LAB — POCT ACTIVATED CLOTTING TIME
Activated Clotting Time: 179 seconds
Activated Clotting Time: 197 seconds
Activated Clotting Time: 227 seconds
Activated Clotting Time: 239 seconds

## 2022-06-30 LAB — GLUCOSE, CAPILLARY
Glucose-Capillary: 129 mg/dL — ABNORMAL HIGH (ref 70–99)
Glucose-Capillary: 142 mg/dL — ABNORMAL HIGH (ref 70–99)

## 2022-06-30 LAB — MRSA NEXT GEN BY PCR, NASAL: MRSA by PCR Next Gen: NOT DETECTED

## 2022-06-30 SURGERY — IR WITH ANESTHESIA
Anesthesia: General

## 2022-06-30 MED ORDER — PROPOFOL 10 MG/ML IV BOLUS
INTRAVENOUS | Status: DC | PRN
  Administered 2022-06-30: 100 mg via INTRAVENOUS

## 2022-06-30 MED ORDER — VERAPAMIL HCL 2.5 MG/ML IV SOLN: INTRA_ARTERIAL | Status: AC | PRN

## 2022-06-30 MED ORDER — CHLORHEXIDINE GLUCONATE 0.12 % MT SOLN
15.0000 mL | Freq: Once | OROMUCOSAL | Status: AC
Start: 2022-06-30 — End: 2022-06-30
  Administered 2022-06-30: 15 mL via OROMUCOSAL
  Filled 2022-06-30: qty 15

## 2022-06-30 MED ORDER — FENTANYL CITRATE (PF) 100 MCG/2ML IJ SOLN
INTRAMUSCULAR | Status: AC
  Filled 2022-06-30: qty 2

## 2022-06-30 MED ORDER — CLOPIDOGREL BISULFATE 75 MG PO TABS
75.0000 mg | ORAL_TABLET | Freq: Every day | ORAL | Status: DC
  Filled 2022-06-30: qty 1

## 2022-06-30 MED ORDER — ORAL CARE MOUTH RINSE: 15.0000 mL | Freq: Once | OROMUCOSAL | Status: AC

## 2022-06-30 MED ORDER — CLEVIDIPINE BUTYRATE 0.5 MG/ML IV EMUL
INTRAVENOUS | Status: AC
  Administered 2022-06-30: 14 mg/h via INTRAVENOUS
  Filled 2022-06-30: qty 50

## 2022-06-30 MED ORDER — REMIFENTANIL HCL 2 MG IV SOLR
INTRAVENOUS | Status: DC | PRN
  Administered 2022-06-30: .1 ug/kg/min via INTRAVENOUS

## 2022-06-30 MED ORDER — CALCIUM CHLORIDE 10 % IV SOLN
INTRAVENOUS | Status: DC | PRN
  Administered 2022-06-30 (×2): 100 mg via INTRAVENOUS

## 2022-06-30 MED ORDER — ONDANSETRON HCL 4 MG/2ML IJ SOLN
INTRAMUSCULAR | Status: DC | PRN
  Administered 2022-06-30: 4 mg via INTRAVENOUS

## 2022-06-30 MED ORDER — LABETALOL HCL 5 MG/ML IV SOLN
10.0000 mg | INTRAVENOUS | Status: DC | PRN
  Administered 2022-06-30: 10 mg via INTRAVENOUS
  Filled 2022-06-30 (×3): qty 4

## 2022-06-30 MED ORDER — ASPIRIN 325 MG PO TABS
325.0000 mg | ORAL_TABLET | Freq: Every day | ORAL | Status: DC
  Administered 2022-07-01: 325 mg via ORAL
  Filled 2022-06-30: qty 1

## 2022-06-30 MED ORDER — CLEVIDIPINE BUTYRATE 0.5 MG/ML IV EMUL
INTRAVENOUS | Status: AC
  Administered 2022-06-30: 2 mg/h via INTRAVENOUS
  Filled 2022-06-30: qty 50

## 2022-06-30 MED ORDER — ASPIRIN 325 MG PO TBEC
DELAYED_RELEASE_TABLET | ORAL | Status: AC
  Filled 2022-06-30: qty 1

## 2022-06-30 MED ORDER — FENTANYL CITRATE (PF) 100 MCG/2ML IJ SOLN: 25.0000 ug | INTRAMUSCULAR | Status: DC | PRN

## 2022-06-30 MED ORDER — ROCURONIUM BROMIDE 10 MG/ML (PF) SYRINGE
PREFILLED_SYRINGE | INTRAVENOUS | Status: DC | PRN
  Administered 2022-06-30: 70 mg via INTRAVENOUS
  Administered 2022-06-30: 30 mg via INTRAVENOUS

## 2022-06-30 MED ORDER — SODIUM CHLORIDE 0.9 % IV SOLN: INTRAVENOUS | Status: DC

## 2022-06-30 MED ORDER — HEPARIN SODIUM (PORCINE) 1000 UNIT/ML IJ SOLN
INTRAMUSCULAR | Status: AC
  Filled 2022-06-30: qty 10

## 2022-06-30 MED ORDER — HEPARIN SODIUM (PORCINE) 1000 UNIT/ML IJ SOLN
INTRAMUSCULAR | Status: DC | PRN
  Administered 2022-06-30: 3000 [IU] via INTRAVENOUS
  Administered 2022-06-30: 1000 [IU] via INTRAVENOUS
  Administered 2022-06-30: 2000 [IU] via INTRAVENOUS

## 2022-06-30 MED ORDER — PHENYLEPHRINE HCL-NACL 20-0.9 MG/250ML-% IV SOLN
INTRAVENOUS | Status: DC | PRN
  Administered 2022-06-30: 25 ug/min via INTRAVENOUS

## 2022-06-30 MED ORDER — SUGAMMADEX SODIUM 200 MG/2ML IV SOLN
INTRAVENOUS | Status: DC | PRN
  Administered 2022-06-30: 150 mg via INTRAVENOUS

## 2022-06-30 MED ORDER — DEXAMETHASONE SODIUM PHOSPHATE 10 MG/ML IJ SOLN
INTRAMUSCULAR | Status: DC | PRN
  Administered 2022-06-30: 10 mg via INTRAVENOUS

## 2022-06-30 MED ORDER — LACTATED RINGERS IV SOLN: INTRAVENOUS | Status: DC

## 2022-06-30 MED ORDER — IOHEXOL 300 MG/ML  SOLN
100.0000 mL | Freq: Once | INTRAMUSCULAR | Status: AC | PRN
  Administered 2022-06-30: 100 mL via INTRA_ARTERIAL

## 2022-06-30 MED ORDER — CLOPIDOGREL BISULFATE 75 MG PO TABS
75.0000 mg | ORAL_TABLET | Freq: Every day | ORAL | Status: DC
  Administered 2022-07-01 – 2022-07-02 (×2): 75 mg via ORAL
  Filled 2022-06-30 (×2): qty 1

## 2022-06-30 MED ORDER — PHENYLEPHRINE 80 MCG/ML (10ML) SYRINGE FOR IV PUSH (FOR BLOOD PRESSURE SUPPORT)
PREFILLED_SYRINGE | INTRAVENOUS | Status: DC | PRN
  Administered 2022-06-30: 80 ug via INTRAVENOUS

## 2022-06-30 MED ORDER — INSULIN ASPART 100 UNIT/ML IJ SOLN: 0.0000 [IU] | INTRAMUSCULAR | Status: DC | PRN

## 2022-06-30 MED ORDER — ALBUMIN HUMAN 5 % IV SOLN: INTRAVENOUS | Status: DC | PRN

## 2022-06-30 MED ORDER — ACETAMINOPHEN 160 MG/5ML PO SOLN: 650.0000 mg | ORAL | Status: DC | PRN

## 2022-06-30 MED ORDER — VERAPAMIL HCL 2.5 MG/ML IV SOLN
INTRAVENOUS | Status: AC
  Filled 2022-06-30: qty 2

## 2022-06-30 MED ORDER — LIDOCAINE 2% (20 MG/ML) 5 ML SYRINGE
INTRAMUSCULAR | Status: DC | PRN
  Administered 2022-06-30: 60 mg via INTRAVENOUS

## 2022-06-30 MED ORDER — NIMODIPINE 30 MG PO CAPS
0.0000 mg | ORAL_CAPSULE | ORAL | Status: AC
  Administered 2022-06-30: 60 mg via ORAL
  Filled 2022-06-30: qty 1

## 2022-06-30 MED ORDER — IOHEXOL 300 MG/ML  SOLN
100.0000 mL | Freq: Once | INTRAMUSCULAR | Status: AC | PRN
  Administered 2022-06-30: 44 mL via INTRA_ARTERIAL

## 2022-06-30 MED ORDER — LIDOCAINE HCL 1 % IJ SOLN
INTRAMUSCULAR | Status: AC
  Filled 2022-06-30: qty 20

## 2022-06-30 MED ORDER — ASPIRIN 325 MG PO TBEC
325.0000 mg | DELAYED_RELEASE_TABLET | ORAL | Status: AC
  Administered 2022-06-30: 325 mg via ORAL
  Filled 2022-06-30: qty 1

## 2022-06-30 MED ORDER — ACETAMINOPHEN 10 MG/ML IV SOLN
1000.0000 mg | Freq: Once | INTRAVENOUS | Status: DC | PRN
Start: 2022-06-30 — End: 2022-06-30

## 2022-06-30 MED ORDER — PROPOFOL 1000 MG/100ML IV EMUL
INTRAVENOUS | Status: AC
  Filled 2022-06-30: qty 200

## 2022-06-30 MED ORDER — NITROGLYCERIN 1 MG/10 ML FOR IR/CATH LAB: INTRA_ARTERIAL | Status: AC | PRN

## 2022-06-30 MED ORDER — NIMODIPINE 30 MG PO CAPS
ORAL_CAPSULE | ORAL | Status: AC
  Filled 2022-06-30: qty 2

## 2022-06-30 MED ORDER — ASPIRIN 81 MG PO CHEW: 324.0000 mg | CHEWABLE_TABLET | Freq: Every day | ORAL | Status: DC

## 2022-06-30 MED ORDER — ACETAMINOPHEN 500 MG PO TABS: 1000.0000 mg | ORAL_TABLET | Freq: Once | ORAL | Status: DC | PRN

## 2022-06-30 MED ORDER — FENTANYL CITRATE (PF) 250 MCG/5ML IJ SOLN
INTRAMUSCULAR | Status: DC | PRN
  Administered 2022-06-30: 100 ug via INTRAVENOUS

## 2022-06-30 MED ORDER — ACETAMINOPHEN 325 MG PO TABS
650.0000 mg | ORAL_TABLET | ORAL | Status: DC | PRN
  Administered 2022-07-01: 650 mg via ORAL
  Filled 2022-06-30: qty 2

## 2022-06-30 MED ORDER — REMIFENTANIL HCL 2 MG IV SOLR
INTRAVENOUS | Status: AC
  Filled 2022-06-30: qty 2000

## 2022-06-30 MED ORDER — SODIUM CHLORIDE 0.9 % IV SOLN
INTRAVENOUS | Status: AC
  Filled 2022-06-30: qty 100

## 2022-06-30 MED ORDER — VASOPRESSIN 20 UNIT/ML IV SOLN
INTRAVENOUS | Status: DC | PRN
  Administered 2022-06-30: 1 [IU] via INTRAVENOUS
  Administered 2022-06-30 (×2): 2 [IU] via INTRAVENOUS
  Administered 2022-06-30 (×2): 1 [IU] via INTRAVENOUS

## 2022-06-30 MED ORDER — CLEVIDIPINE BUTYRATE 0.5 MG/ML IV EMUL
0.0000 mg/h | INTRAVENOUS | Status: DC
  Administered 2022-06-30: 19 mg/h via INTRAVENOUS
  Administered 2022-06-30: 20 mg/h via INTRAVENOUS
  Administered 2022-06-30 – 2022-07-01 (×4): 19 mg/h via INTRAVENOUS
  Administered 2022-07-01: 18 mg/h via INTRAVENOUS
  Administered 2022-07-01: 4 mg/h via INTRAVENOUS
  Administered 2022-07-01: 18 mg/h via INTRAVENOUS
  Administered 2022-07-01: 17 mg/h via INTRAVENOUS
  Administered 2022-07-01: 18 mg/h via INTRAVENOUS
  Filled 2022-06-30 (×2): qty 50
  Filled 2022-06-30 (×3): qty 100
  Filled 2022-06-30 (×2): qty 50
  Filled 2022-06-30: qty 100
  Filled 2022-06-30: qty 50
  Filled 2022-06-30 (×2): qty 100
  Filled 2022-06-30 (×2): qty 50

## 2022-06-30 MED ORDER — NOREPINEPHRINE 4 MG/250ML-% IV SOLN
0.0000 ug/min | INTRAVENOUS | Status: DC
  Administered 2022-06-30: 2 ug/min via INTRAVENOUS
  Filled 2022-06-30: qty 250

## 2022-06-30 MED ORDER — EPHEDRINE SULFATE-NACL 50-0.9 MG/10ML-% IV SOSY
PREFILLED_SYRINGE | INTRAVENOUS | Status: DC | PRN
  Administered 2022-06-30: 15 mg via INTRAVENOUS
  Administered 2022-06-30: 10 mg via INTRAVENOUS
  Administered 2022-06-30: 15 mg via INTRAVENOUS
  Administered 2022-06-30: 10 mg via INTRAVENOUS

## 2022-06-30 MED ORDER — GLYCOPYRROLATE 0.2 MG/ML IJ SOLN
INTRAMUSCULAR | Status: DC | PRN
  Administered 2022-06-30: .2 mg via INTRAVENOUS

## 2022-06-30 MED ORDER — CEFAZOLIN SODIUM-DEXTROSE 2-4 GM/100ML-% IV SOLN
2.0000 g | INTRAVENOUS | Status: AC
  Administered 2022-06-30: 2 g via INTRAVENOUS
  Filled 2022-06-30: qty 100

## 2022-06-30 MED ORDER — CHLORHEXIDINE GLUCONATE CLOTH 2 % EX PADS
6.0000 | MEDICATED_PAD | Freq: Every day | CUTANEOUS | Status: DC
  Administered 2022-07-01: 6 via TOPICAL

## 2022-06-30 MED ORDER — ACETAMINOPHEN 650 MG RE SUPP: 650.0000 mg | RECTAL | Status: DC | PRN

## 2022-06-30 MED ORDER — NITROGLYCERIN 1 MG/10 ML FOR IR/CATH LAB
INTRA_ARTERIAL | Status: AC
  Filled 2022-06-30: qty 10

## 2022-06-30 MED ORDER — ACETAMINOPHEN 160 MG/5ML PO SOLN: 1000.0000 mg | Freq: Once | ORAL | Status: DC | PRN

## 2022-06-30 NOTE — H&P (Signed)
  The note originally documented on this encounter has been moved the the encounter in which it belongs.  

## 2022-06-30 NOTE — Sedation Documentation (Signed)
TR band applied. Bleeding stopped.

## 2022-06-30 NOTE — H&P (Signed)
 Chief Complaint: Patient was seen in consultation today for brain aneurysm  Referring Physician(s): Dr. Pramod Sethi  Supervising Physician: De Macedo Rodrigues, [place]  Patient Status: [facility] - Out-pt  History of Present Illness: Nicholas Shelton is a 80 y.o. male with past medical history of HTN, HLD, TAVR, gout, GAD, DM, who presented to ophthalmologist in April 2023 with complaint of double vision.  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  This was negative, however during the process he was found to have a 4mm suproclinoid left ICA aneurysm.  He met in consultation with Dr. de Macedo Rodrigues several times to discuss the merits of intervention for his aneurysm, including risks and benefit.  After much thought and careful consideration he has elected to proceed.   Nicholas Shelton presents to [facility] today in her usual state of health.  He has been NPO.  He has held his ELiquis, but is taking Plavix and aspirin.   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   Degeneration of lumbar or lumbosacral intervertebral disc    Deviated nasal septum    Diabetes mellitus without complication (HCC)    Gastroesophageal reflux disease without esophagitis    Generalized anxiety disorder    Gout with tophus    Hearing loss    Hyperlipidemia    Hypertension    Insomnia    Murmur    Aortic stenosis   Nasal turbinate hypertrophy    Nonischemic cardiomyopathy (HCC)    Osteoarthritis of wrist    PAC (premature atrial contraction)    PAF (paroxysmal atrial fibrillation) (HCC) 03/03/2022   Peptic ulcer    Peripheral neuropathy    PVC (premature ventricular contraction)    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    Past Surgical History:  Procedure Laterality Date   CARDIAC CATHETERIZATION     COLONOSCOPY     ELBOW SURGERY     pinched nerve on both arms   INGUINAL HERNIA REPAIR Right 10/30/2018   Procedure: OPEN REPAIR OF INCARCERATED RIGHT INGUINAL HERNIA WITH MESH;  Surgeon: Wilson, Eric, MD;  Location: [facility];  Service: General;  Laterality: Right;   JOINT REPLACEMENT     LEFT HEART CATH AND CORONARY ANGIOGRAPHY N/A 01/15/2021   Procedure: LEFT HEART CATH AND CORONARY ANGIOGRAPHY;  Surgeon: Ganji, Jay, MD;  Location: [facility];  Service: Cardiovascular;  Laterality: N/A;   TEE WITHOUT CARDIOVERSION N/A 12/24/2021   Procedure: TRANSESOPHAGEAL ECHOCARDIOGRAM (TEE);  Surgeon: Cooper, Michael, MD;  Location: [facility];  Service: Open Heart Surgery;  Laterality: N/A;   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   Procedure: TOTAL KNEE ARTHROPLASTY;  Surgeon: Collins, Robert, MD;  Location: [facility];  Service: Orthopedics;  Laterality: Right;  with adductor canal   WRIST SURGERY     pinched nerve    Allergies: Tape and Trazodone and nefazodone  Medications: Prior to Admission medications   Medication Sig Start Date End Date Taking? Authorizing Provider  acetaminophen (TYLENOL) 500 MG tablet Take 500-1,000 mg by mouth every 8 (eight) hours   as needed for moderate pain.    [provider]  apixaban (ELIQUIS) 5 MG TABS tablet Take 1 tablet (5 mg total) by mouth 2 (two) times daily. 06/17/22   Tolia, Sunit, DO  aspirin EC 81 MG tablet Take 81 mg by mouth daily. Swallow whole.    [provider]  atorvastatin (LIPITOR) 10 MG tablet Take 10 mg by mouth daily. 02/25/22   [provider]  bumetanide (BUMEX) 1 MG tablet Take 1 tablet (1 mg total) by mouth every morning. 05/16/22 08/14/22  Tolia, Sunit, DO  cloNIDine (CATAPRES) 0.3 MG tablet Take 1 tablet (0.3 mg total) by mouth 3  (three) times daily. 05/15/22 08/13/22  Tolia, Sunit, DO  clopidogrel (PLAVIX) 75 MG tablet Take 1 tablet (75 mg total) by mouth daily. 03/27/22   Ezequiel Essex, MD  dapagliflozin propanediol (FARXIGA) 10 MG TABS tablet Take 1 tablet (10 mg total) by mouth daily. 05/27/22   Tolia, Sunit, DO  hydrALAZINE (APRESOLINE) 100 MG tablet Take 1 tablet (100 mg total) by mouth 3 (three) times daily. 02/17/22   Tolia, Sunit, DO  isosorbide dinitrate (ISORDIL) 20 MG tablet TAKE 2 TABLETS BY MOUTH THREE TIMES DAILY 06/16/22   Tolia, Sunit, DO  metFORMIN (GLUCOPHAGE) 1000 MG tablet Take 1 tablet (1,000 mg total) by mouth 2 (two) times daily with a meal. 01/17/21   Adrian Prows, MD  metoprolol succinate (TOPROL-XL) 25 MG 24 hr tablet Take 1 tablet (25 mg total) by mouth daily. 05/07/22   Tolia, Sunit, DO  Multiple Vitamin (MULTI VITAMIN MENS PO) Take 1 tablet by mouth daily.    [provider]  pantoprazole (PROTONIX) 40 MG tablet Take 40 mg by mouth daily before breakfast.    [provider]  Psyllium (METAMUCIL PO) Take 1 Dose by mouth daily as needed (constipation).    [provider]  ticagrelor (BRILINTA) 90 MG TABS tablet Take 90 mg by mouth 2 (two) times daily.    [provider]     Family History  Problem Relation Age of Onset   Heart attack Mother     Social History   Socioeconomic History   Marital status: Married    Spouse name: Not on file   Number of children: 3   Years of education: Not on file   Highest education level: Not on file  Occupational History   Not on file  Tobacco Use   Smoking status: Former    Packs/day: 0.50    Years: 15.00    Total pack years: 7.50    Types: Cigarettes    Quit date: 38    Years since quitting: 27.6   Smokeless tobacco: Never  Vaping Use   Vaping Use: Never used  Substance and Sexual Activity   Alcohol use: No   Drug use: No   Sexual activity: Not on file    Comment: married  Other Topics Concern   Not on file   Social History Narrative   Not on file   Social Determinants of Health   Financial Resource Strain: Not on file  Food Insecurity: Not on file  Transportation Needs: Not on file  Physical Activity: Not on file  Stress: Not on file  Social Connections: Not on file     Review of Systems: A 12 point ROS discussed and pertinent positives are indicated in the HPI above.  All other systems are negative.  Review of Systems  Constitutional:  Negative for fatigue and fever.  Eyes:  Negative  for visual disturbance.  Respiratory:  Negative for cough, choking and shortness of breath.   Gastrointestinal:  Negative for abdominal distention, abdominal pain, constipation and vomiting.  Genitourinary:  Negative for dysuria.  Neurological:  Negative for light-headedness and headaches.  Psychiatric/Behavioral:  Negative for behavioral problems and confusion.     Vital Signs: There were no vitals taken for this visit.  Physical Exam Vitals and nursing note reviewed.  Constitutional:      General: He is not in acute distress.    Appearance: Normal appearance. He is not ill-appearing.  HENT:     Mouth/Throat:     Mouth: Mucous membranes are moist.     Pharynx: Oropharynx is clear.  Cardiovascular:     Rate and Rhythm: Normal rate and regular rhythm.  Pulmonary:     Effort: Pulmonary effort is normal.     Breath sounds: Normal breath sounds.  Abdominal:     General: Abdomen is flat. There is no distension.     Palpations: Abdomen is soft.  Skin:    General: Skin is warm and dry.  Neurological:     General: No focal deficit present.     Mental Status: He is alert. Mental status is at baseline.  Psychiatric:        Behavior: Behavior normal.      MD Evaluation Airway: WNL Heart: WNL Abdomen: WNL Chest/ Lungs: WNL ASA  Classification: 3 Mallampati/Airway Score: Two   Imaging: No results found.  Labs:  CBC: Recent Labs    03/22/22 1200 03/23/22 0442 03/25/22 0409  03/26/22 0348 05/15/22 1432  WBC 6.5 5.8 4.4 4.4  --   HGB 11.1* 10.0* 9.3* 9.6* 10.5*  HCT 32.8* 29.0* 27.3* 28.6* 31.6*  PLT 126* 108* 102* 108*  --     COAGS: Recent Labs    12/20/21 1116 02/19/22 1003  INR 1.0 1.0  APTT  --  31    BMP: Recent Labs    03/24/22 0955 03/25/22 0409 03/26/22 0348 05/15/22 1432 05/23/22 1054 06/30/22 0746  NA 132* 133* 134* 140 139 137  K 3.3* 3.2* 4.0 4.8 4.0 4.5  CL 100 99 101 106 102 104  CO2 24 26 25 21 21 20*  GLUCOSE 191* 121* 102* 117* 130* 124*  BUN 17 22 30* 23 27 45*  CALCIUM 8.2* 7.9* 8.0* 8.9 8.6 8.8*  CREATININE 1.21 1.34* 1.50* 1.25 1.31* 1.49*  GFRNONAA 60* 53* 46*  --   --  47*    LIVER FUNCTION TESTS: Recent Labs    03/03/22 1151 03/22/22 1200 03/23/22 0442 05/15/22 1432  BILITOT 0.4 0.9 1.2 0.5  AST 21 21 18 20  ALT 18 30 22 24  ALKPHOS 110 82 71 173*  PROT 6.8 6.6 5.8* 6.3  ALBUMIN 4.1 3.9 3.2* 4.0    TUMOR MARKERS: No results for input(s): "AFPTM", "CEA", "CA199", "CHROMGRNA" in the last 8760 hours.  Assessment and Plan: Patient with past medical history of HTN, HLD, GAD,  presents with complaint of incidentally found 4mm brain aneurysm.  IR consulted for intervention at the request of Dr. Sethi. Case reviewed by Dr. de Macedo Rodrigues Who has met with the patient in consultation and who approves patient for procedure.  Patient presents today in their usual state of health. He has elected to proceed.  He has been NPO and is not currently on blood thinners.  BP management per anesthesia.  He has taken Plavix and aspirin 81mg.  Will dose aspirin to 325mg.     Risks and benefits of cerebral angiogram with intervention were discussed with the patient including, but not limited to bleeding, infection, vascular injury, contrast induced renal failure, stroke or even death.  This interventional procedure involves the use of X-rays and because of the nature of the planned procedure, it is possible that we  will have prolonged use of X-ray fluoroscopy.  Potential radiation risks to you include (but are not limited to) the following: - A slightly elevated risk for cancer  several years later in life. This risk is typically less than 0.5% percent. This risk is low in comparison to the normal incidence of human cancer, which is 33% for women and 50% for men according to the [employer]. - Radiation induced injury can include skin redness, resembling a rash, tissue breakdown / ulcers and hair loss (which can be temporary or permanent).   The likelihood of either of these occurring depends on the difficulty of the procedure and whether you are sensitive to radiation due to previous procedures, disease, or genetic conditions.   IF your procedure requires a prolonged use of radiation, you will be notified and given written instructions for further action.  It is your responsibility to monitor the irradiated area for the 2 weeks following the procedure and to notify your physician if you are concerned that you have suffered a radiation induced injury.    All of the patient's questions were answered, patient is agreeable to proceed.  Consent signed and in chart.  Thank you for this interesting consult.  I greatly enjoyed meeting Nicholas Shelton and look forward to participating in their care.  A copy of this report was sent to the requesting provider on this date.  Electronically Signed: Kacie Sue-Ellen Matthews, PA 06/30/2022, 8:23 AM   I spent a total of  30 Minutes   in face to face in clinical consultation, greater than 50% of which was counseling/coordinating care for brain aneurysm  

## 2022-06-30 NOTE — Anesthesia Procedure Notes (Signed)
Procedure Name: Intubation Date/Time: 06/30/2022 9:14 AM  Performed by: Lance Coon, CRNAPre-anesthesia Checklist: Patient identified, Emergency Drugs available, Suction available, Patient being monitored and Timeout performed Patient Re-evaluated:Patient Re-evaluated prior to induction Oxygen Delivery Method: Circle system utilized Preoxygenation: Pre-oxygenation with 100% oxygen Induction Type: IV induction Ventilation: Mask ventilation without difficulty Laryngoscope Size: Miller and 3 Grade View: Grade II Tube type: Oral Tube size: 7.0 mm Number of attempts: 1 Airway Equipment and Method: Stylet Placement Confirmation: ETT inserted through vocal cords under direct vision, breath sounds checked- equal and bilateral and positive ETCO2 Secured at: 21 cm Tube secured with: Tape Dental Injury: Teeth and Oropharynx as per pre-operative assessment

## 2022-06-30 NOTE — Sedation Documentation (Signed)
Radial site bleeding. Dr. Ethel Rana at bedside holding pressure.

## 2022-06-30 NOTE — Procedures (Addendum)
INTERVENTIONAL NEURORADIOLOGY BRIEF POSTPROCEDURE NOTE  DIAGNOSTIC CEREBRAL ANGIOGRAM AND ENDOVASCULAR ANEURYSM EMBOLIZATION  Attending: Dr. [name]  Assistant: Dr. Frazier Richards  Diagnosis: Left ICA aneurysms  Access site: 7French right radial; 8 French right femoral  Access closure: TR band; Perclose Prostyle  Anesthesia: GETA  Medication used: 9000 units heparin IV.  Complications: None.  Estimated blood loss: Negligible.  Specimen: None.  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 Given the uncoilable blister aneurysm, decision was made to proceed with placement of flow diverters across the neck of the aneurysms. Two partially overlapping surpass evolve flow diverter (4 x 15 mm and 5 x 17 mm) were placed in the left ICA. No evidence of hemorrhagic or thromboembolic complication.  The patient tolerated the procedure well without incident or complication and is in stable condition.   PLAN:  - bed rest x 6 hours, may raise HOB 30 after 1.5h - SBP 100-140 mmHg - order for cleviprex placed.

## 2022-06-30 NOTE — Transfer of Care (Signed)
Immediate Anesthesia Transfer of Care Note  Patient: Nicholas Shelton  Procedure(s) Performed: EMBLOIZIATION IR TRANSCATH/EMBOLIZ IR ANGIO INTRA EXTRACRAN SEL INTERNAL CAROTID UNI L MOD SED IR US GUIDE VASC ACCESS RIGHT IR 3D INDEPENDENT WKST IR CT HEAD LTD IR ANGIO VERTEBRAL SEL VERTEBRAL UNI L MOD SED IR ANGIO INTRA EXTRACRAN SEL COM CAROTID INNOMINATE UNI R MOD SED  Patient Location: PACU  Anesthesia Type:General  Level of Consciousness: drowsy and patient cooperative  Airway & Oxygen Therapy: Patient Spontanous Breathing  Post-op Assessment: Report given to RN and Post -op Vital signs reviewed and stable  Post vital signs: Reviewed and stable  Last Vitals:  Vitals Value Taken Time  BP    Temp    Pulse 56 06/30/22 1227  Resp 18 06/30/22 1227  SpO2 83 % 06/30/22 1227  Vitals shown include unvalidated device data.  Last Pain:  Vitals:   06/30/22 0758  TempSrc:   PainSc: 0-No pain         Complications: No notable events documented.

## 2022-07-01 ENCOUNTER — Other Ambulatory Visit (HOSPITAL_COMMUNITY): Payer: Self-pay | Admitting: Radiology

## 2022-07-01 ENCOUNTER — Other Ambulatory Visit (HOSPITAL_COMMUNITY): Payer: Self-pay | Admitting: Neuroradiology

## 2022-07-01 ENCOUNTER — Encounter (HOSPITAL_COMMUNITY): Payer: Self-pay | Admitting: Neuroradiology

## 2022-07-01 DIAGNOSIS — I671 Cerebral aneurysm, nonruptured: Secondary | ICD-10-CM

## 2022-07-01 LAB — TRIGLYCERIDES: Triglycerides: 120 mg/dL (ref <150)

## 2022-07-01 MED ORDER — ASPIRIN 81 MG PO TBEC
81.0000 mg | DELAYED_RELEASE_TABLET | Freq: Every day | ORAL | Status: DC
  Administered 2022-07-02: 81 mg via ORAL
  Filled 2022-07-01: qty 1

## 2022-07-01 MED ORDER — HYDRALAZINE HCL 20 MG/ML IJ SOLN
5.0000 mg | INTRAMUSCULAR | Status: DC | PRN
  Administered 2022-07-01 – 2022-07-02 (×2): 5 mg via INTRAVENOUS
  Filled 2022-07-01 (×2): qty 1

## 2022-07-01 MED ORDER — CLONIDINE HCL 0.2 MG PO TABS
0.3000 mg | ORAL_TABLET | Freq: Three times a day (TID) | ORAL | Status: DC
  Administered 2022-07-01 – 2022-07-02 (×4): 0.3 mg via ORAL
  Filled 2022-07-01 (×4): qty 1

## 2022-07-01 MED ORDER — METOPROLOL TARTRATE 25 MG PO TABS
25.0000 mg | ORAL_TABLET | Freq: Every day | ORAL | Status: DC
  Administered 2022-07-01 – 2022-07-02 (×2): 25 mg via ORAL
  Filled 2022-07-01 (×2): qty 1

## 2022-07-01 MED ORDER — HYDRALAZINE HCL 50 MG PO TABS
100.0000 mg | ORAL_TABLET | Freq: Three times a day (TID) | ORAL | Status: DC
  Administered 2022-07-01 – 2022-07-02 (×4): 100 mg via ORAL
  Filled 2022-07-01 (×4): qty 2

## 2022-07-01 MED ORDER — ORAL CARE MOUTH RINSE
15.0000 mL | OROMUCOSAL | Status: DC | PRN
Start: 2022-07-01 — End: 2022-07-02

## 2022-07-01 MED ORDER — ISOSORBIDE DINITRATE 20 MG PO TABS
40.0000 mg | ORAL_TABLET | Freq: Three times a day (TID) | ORAL | Status: DC
  Administered 2022-07-01 – 2022-07-02 (×4): 40 mg via ORAL
  Filled 2022-07-01 (×4): qty 2

## 2022-07-01 MED ORDER — BUMETANIDE 1 MG PO TABS
1.0000 mg | ORAL_TABLET | Freq: Every day | ORAL | Status: DC
Start: 2022-07-01 — End: 2022-07-02
  Administered 2022-07-01 – 2022-07-02 (×2): 1 mg via ORAL
  Filled 2022-07-01 (×2): qty 1

## 2022-07-01 MED ORDER — APIXABAN 5 MG PO TABS
5.0000 mg | ORAL_TABLET | Freq: Two times a day (BID) | ORAL | Status: DC
  Administered 2022-07-01 – 2022-07-02 (×2): 5 mg via ORAL
  Filled 2022-07-01 (×2): qty 1

## 2022-07-01 NOTE — Progress Notes (Signed)
Referring Physician(s): Dr. Royal Hawthorn  Supervising Physician: Katherina Right Lyla Glassing  Patient Status:  [facility] - In-pt  Chief Complaint:  PCOM aneurysm s/p cerebral arteriogram with flow diverter placed by. Dr. Harrold Donath   Subjective:  Patient states that he is feeling better. States he did not sleep well overnight.  Allergies: Tape and Trazodone and nefazodone  Medications: Prior to Admission medications   Medication Sig Start Date End Date Taking? Authorizing Provider  acetaminophen (TYLENOL) 500 MG tablet Take 500-1,000 mg by mouth every 8 (eight) hours as needed for moderate pain.   Yes [provider]  aspirin EC 81 MG tablet Take 81 mg by mouth daily. Swallow whole.   Yes [provider]  atorvastatin (LIPITOR) 10 MG tablet Take 10 mg by mouth daily. 02/25/22  Yes [provider]  bumetanide (BUMEX) 1 MG tablet Take 1 tablet (1 mg total) by mouth every morning. 05/16/22 08/14/22 Yes Tolia, Sunit, DO  cloNIDine (CATAPRES) 0.3 MG tablet Take 1 tablet (0.3 mg total) by mouth 3 (three) times daily. 05/15/22 08/13/22 Yes Tolia, Sunit, DO  clopidogrel (PLAVIX) 75 MG tablet Take 1 tablet (75 mg total) by mouth daily. 03/27/22  Yes Ezequiel Essex, MD  dapagliflozin propanediol (FARXIGA) 10 MG TABS tablet Take 1 tablet (10 mg total) by mouth daily. 05/27/22  Yes Tolia, Sunit, DO  hydrALAZINE (APRESOLINE) 100 MG tablet Take 1 tablet (100 mg total) by mouth 3 (three) times daily. 02/17/22  Yes Tolia, Sunit, DO  isosorbide dinitrate (ISORDIL) 20 MG tablet TAKE 2 TABLETS BY MOUTH THREE TIMES DAILY 06/16/22  Yes Tolia, Sunit, DO  metFORMIN (GLUCOPHAGE) 1000 MG tablet Take 1 tablet (1,000 mg total) by mouth 2 (two) times daily with a meal. 01/17/21  Yes Adrian Prows, MD  metoprolol succinate (TOPROL-XL) 25 MG 24 hr tablet Take 1 tablet (25 mg total) by mouth daily. 05/07/22  Yes Tolia, Sunit, DO  Multiple Vitamin (MULTI VITAMIN MENS PO) Take 1 tablet by mouth  daily.   Yes [provider]  pantoprazole (PROTONIX) 40 MG tablet Take 40 mg by mouth daily before breakfast.   Yes [provider]  Psyllium (METAMUCIL PO) Take 1 Dose by mouth daily as needed (constipation).   Yes [provider]  apixaban (ELIQUIS) 5 MG TABS tablet Take 1 tablet (5 mg total) by mouth 2 (two) times daily. 06/17/22   Tolia, Sunit, DO  ticagrelor (BRILINTA) 90 MG TABS tablet Take 90 mg by mouth 2 (two) times daily.    [provider]     Vital Signs: BP (!) 149/58   Pulse 76   Temp 98.6 F (37 C) (Oral)   Resp 20   Ht '5\' 11"'$  (1.803 m)   Wt 175 lb (79.4 kg)   SpO2 97%   BMI 24.41 kg/m   Physical Exam Vitals and nursing note reviewed.  Constitutional:      Appearance: He is well-developed.  HENT:     Head: Normocephalic.  Cardiovascular:     Rate and Rhythm: Normal rate and regular rhythm.     Comments: B/P 152/55 Pulmonary:     Effort: Pulmonary effort is normal.  Musculoskeletal:        General: Normal range of motion.     Cervical back: Normal range of motion.  Skin:    General: Skin is dry.     Findings: Bruising (bilteral upper extremities) present.  Neurological:     Mental Status: He is alert and  oriented to person, place, and time.     Comments: Alert, aware and oriented X 3 No facial droop noted Tongue midline Can spontaneously move all 4 extremities. Hand grip strength equal bilaterally.  Speech, cognition and language  are generally intact.  Comprehension and fluency are normal.  Judgment and insight normal  Negative pronator drift. Fine motor and coordination intact Gait not assessed Romberg not assessed Heel to toe not assessed Distal pulses not assessed       Imaging: No results found.  Labs:  CBC: Recent Labs    03/23/22 0442 03/25/22 0409 03/26/22 0348 05/15/22 1432 06/30/22 0746  WBC 5.8 4.4 4.4  --  3.8*  HGB 10.0* 9.3* 9.6* 10.5* 10.1*  HCT 29.0* 27.3* 28.6* 31.6* 32.7*  PLT  108* 102* 108*  --  92*    COAGS: Recent Labs    12/20/21 1116 02/19/22 1003 06/30/22 0805  INR 1.0 1.0 1.1  APTT  --  31  --     BMP: Recent Labs    03/24/22 0955 03/25/22 0409 03/26/22 0348 05/15/22 1432 05/23/22 1054 06/30/22 0746  NA 132* 133* 134* 140 139 137  K 3.3* 3.2* 4.0 4.8 4.0 4.5  CL 100 99 101 106 102 104  CO2 '24 26 25 21 21 '$ 20*  GLUCOSE 191* 121* 102* 117* 130* 124*  BUN 17 22 30* 23 27 45*  CALCIUM 8.2* 7.9* 8.0* 8.9 8.6 8.8*  CREATININE 1.21 1.34* 1.50* 1.25 1.31* 1.49*  GFRNONAA 60* 53* 46*  --   --  47*    LIVER FUNCTION TESTS: Recent Labs    03/03/22 1151 03/22/22 1200 03/23/22 0442 05/15/22 1432  BILITOT 0.4 0.9 1.2 0.5  AST '21 21 18 20  '$ ALT '18 30 22 24  '$ ALKPHOS 110 82 71 173*  PROT 6.8 6.6 5.8* 6.3  ALBUMIN 4.1 3.9 3.2* 4.0    Assessment and Plan:  81 y.o. male inpatient. Former smoker. History of HTN, HLD, DM, TAVR (bioprosthetic aortic valve replacement), anxiety and gout. Sent to the ED at [facility] as a code stroke on 4.12.23 after being seen at opthaologist office for diplopia and found to have a retinal artery occlusion. Code stroke was negative but an incidental finding of brain aneurysm was noted on MRI. MR Angio from 4.12.23 reads  4 mm saccular left PCOM aneurysm. This would likely be amendable to endovascular treatment. Intervention performed on 8.19.23 with Dr. Karenann Cai. 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 Given the uncoilable blister aneurysm, decision was made to proceed with placement of flow diverters across the neck of the aneurysms. Two partially overlapping surpass evolve flow diverter (4 x 15 mm and 5 x 17 mm) were placed in the left ICA. No evidence of hemorrhagic or thromboembolic complication.   Overnight patient hypertensive. Blood pressure controlled with IV antihypertensives. Dr. Harrold Donath at bedside B/P 152/55.  Patient denying any issues at this time. Right groin and right radial access site is soft with no active bleeding and no appreciable pseudoaneurysm. Will attempt to transition Patient to po medications. He will be held overnight for further evaluation.   Electronically Signed: Jacqualine Mau, NP 07/01/2022, 1:05 PM   I spent a total of 15 Minutes at the patient's bedside AND on the patient's hospital floor or unit, greater than 50% of which was counseling/coordinating care for PCOM aneurysm with intervention

## 2022-07-01 NOTE — Progress Notes (Signed)
Noted on monitor patient had sustained run of Vtach. Reviewed strip and notified Dr. Debbrah Alar of 9 beat run of Vtach. Will continue to monitor.

## 2022-07-02 ENCOUNTER — Other Ambulatory Visit (HOSPITAL_COMMUNITY): Payer: Self-pay | Admitting: Neuroradiology

## 2022-07-02 ENCOUNTER — Encounter (HOSPITAL_COMMUNITY): Payer: Self-pay

## 2022-07-02 DIAGNOSIS — I671 Cerebral aneurysm, nonruptured: Secondary | ICD-10-CM

## 2022-07-02 HISTORY — PX: IR ANGIOGRAM FOLLOW UP STUDY: IMG697

## 2022-07-02 LAB — PLATELET INHIBITION P2Y12

## 2022-07-02 NOTE — Discharge Summary (Signed)
Patient ID: Nicholas Shelton MRN: [id number] DOB/AGE: [birth year] 83 y.o.  Admit date: 06/30/2022 Discharge date: 07/02/2022  Supervising Physician: de Rosario Jacks   Patient Status: [facility] - In-pt  Admission Diagnoses: PCOM aneurysm   Discharge Diagnoses:  Principal Problem:   Brain aneurysm   Discharged Condition: good  Hospital Course:   Patient presented to [place] for an cerebral angiogram with intervention with Dr. Karenann Cai on 8/21. Procedure occurred without major complications and patient was transferred to floor in stable condition (extubated w/o difficulty, VSS, right groin and right wrist puncture site stable) for overnight observation.   8/22: Patient had refractory HTN which required IV HTN meds, aslo developed 9 beats of Vtach. Decision was made to keep the patient for one more day. He was stable otherwise.  8/23: Patient awake and alert  no complaints at this time. Right groin puncture site stable, no appreciable pseudoaneurysm. DP 1+ bilaterally. Small blood noted under Tegaderm on right wrist, bleeding was from skin tear, not from the puncture site. R radial pulse 2+. No appreciable pseudoaneurysm. BP still elevated at 163/89, pt asymptomatic. P2Y12 came back 264, patient will need to be on Plavix, Eliquis, and ASA 81 mg for at least one month. Repeat P2Y12 needs to be obtained before one month f/u visit.   Plan to discharge home today and follow-up with Dr. Karenann Cai at [place] in 1 month after discharge.  PLAN  - D/C today - Repeat P2Y12 2-3 days before follow up visit  - Cont Eliquis 5 mg BID, Plavix 75 mg qd, ASA 81 mg qd  - Patient was strongly encouraged to follow up with cardiology for HTN control  - no lifting, stooping, bending more than 10 pounds for 1 week to minimize risk of bleeding from R groin puncture site   Consults: None  Significant Diagnostic Studies: none  Treatments: cerebral angiogram with endovascular flow  diverter stent placement  Discharge Exam: Blood pressure (!) 163/89, pulse 83, temperature 99.1 F (37.3 C), temperature source Axillary, resp. rate 17, height '5\' 11"'$  (1.803 m), weight 175 lb (79.4 kg), SpO2 98 %. Physical Exam Vitals reviewed.  Constitutional:      General: He is not in acute distress.    Appearance: Normal appearance. He is not ill-appearing.  HENT:     Head: Normocephalic.  Cardiovascular:     Rate and Rhythm: Normal rate and regular rhythm.  Pulmonary:     Effort: Pulmonary effort is normal.  Abdominal:     General: Abdomen is flat.     Palpations: Abdomen is soft.  Musculoskeletal:     Cervical back: Neck supple.  Skin:    General: Skin is warm and dry.     Comments: Ecchymosis on right forearm, small blood from skin tear. Right wrist and right groin puncture site c/d/I, no appreciable pseudoaneurysm. RA pulse 2+, DP 1+ bilaterally    Neurological:     Mental Status: He is alert and oriented to person, place, and time.  Psychiatric:        Behavior: Behavior normal.     Disposition: Discharge disposition: 01-Home or Self Care       Discharge Instructions     Call MD for:   Complete by: As directed    Call MD for:  difficulty breathing, headache or visual disturbances   Complete by: As directed    Call MD for:  extreme fatigue   Complete by: As directed  Call MD for:  hives   Complete by: As directed    Call MD for:  persistant dizziness or light-headedness   Complete by: As directed    Call MD for:  persistant nausea and vomiting   Complete by: As directed    Call MD for:  redness, tenderness, or signs of infection (pain, swelling, redness, odor or green/yellow discharge around incision site)   Complete by: As directed    Call MD for:  severe uncontrolled pain   Complete by: As directed    Call MD for:  temperature >100.4   Complete by: As directed    Diet - low sodium heart healthy   Complete by: As directed    Discharge wound  care:   Complete by: As directed    Keep right wrist and right groin puncture sites clean and dry until fully heals. No submerging (sitting in a bath tub or swimming) for 7 days.   Increase activity slowly   Complete by: As directed    Lifting restrictions   Complete by: As directed    No lifting, stopping, bending more than 10 pounds for 1 week.      Allergies as of 07/02/2022       Reactions   Tape    Pulls skin off   Trazodone And Nefazodone Other (See Comments)   Sluggishness the next morning        Medication List     STOP taking these medications    ticagrelor 90 MG Tabs tablet Commonly known as: BRILINTA       TAKE these medications    acetaminophen 500 MG tablet Commonly known as: TYLENOL Take 500-1,000 mg by mouth every 8 (eight) hours as needed for moderate pain.   apixaban 5 MG Tabs tablet Commonly known as: Eliquis Take 1 tablet (5 mg total) by mouth 2 (two) times daily.   aspirin EC 81 MG tablet Take 81 mg by mouth daily. Swallow whole.   atorvastatin 10 MG tablet Commonly known as: LIPITOR Take 10 mg by mouth daily.   bumetanide 1 MG tablet Commonly known as: Bumex Take 1 tablet (1 mg total) by mouth every morning.   cloNIDine 0.3 MG tablet Commonly known as: Catapres Take 1 tablet (0.3 mg total) by mouth 3 (three) times daily.   clopidogrel 75 MG tablet Commonly known as: PLAVIX Take 1 tablet (75 mg total) by mouth daily.   dapagliflozin propanediol 10 MG Tabs tablet Commonly known as: FARXIGA Take 1 tablet (10 mg total) by mouth daily.   hydrALAZINE 100 MG tablet Commonly known as: APRESOLINE Take 1 tablet (100 mg total) by mouth 3 (three) times daily.   isosorbide dinitrate 20 MG tablet Commonly known as: ISORDIL TAKE 2 TABLETS BY MOUTH THREE TIMES DAILY   METAMUCIL PO Take 1 Dose by mouth daily as needed (constipation).   metFORMIN 1000 MG tablet Commonly known as: GLUCOPHAGE Take 1 tablet (1,000 mg total) by mouth 2  (two) times daily with a meal.   metoprolol succinate 25 MG 24 hr tablet Commonly known as: TOPROL-XL Take 1 tablet (25 mg total) by mouth daily.   MULTI VITAMIN MENS PO Take 1 tablet by mouth daily.   pantoprazole 40 MG tablet Commonly known as: PROTONIX Take 40 mg by mouth daily before breakfast.               Discharge Care Instructions  (From admission, onward)           Start  Ordered   07/02/22 0000  Discharge wound care:       Comments: Keep right wrist and right groin puncture sites clean and dry until fully heals. No submerging (sitting in a bath tub or swimming) for 7 days.   07/02/22 1735            Follow-up Information     de Rosario Jacks, MD Follow up.   Specialties: Radiology, Interventional Radiology Why: Follow up visit in 1 month. Schedulers will call you to set up the appointment. Contact information: [place] [address] [phone]                  Electronically Signed: Tera Mater, PA-C 07/02/2022, 9:36 AM   I have spent Less Than 30 Minutes discharging Romon Surace.

## 2022-07-03 NOTE — Anesthesia Postprocedure Evaluation (Signed)
Anesthesia Post Note  Patient: Chief Financial Officer  Procedure(s) Performed: EMBLOIZIATION IR TRANSCATH/EMBOLIZ IR ANGIO INTRA EXTRACRAN SEL INTERNAL CAROTID UNI L MOD SED IR US GUIDE VASC ACCESS RIGHT IR 3D INDEPENDENT WKST IR CT HEAD LTD IR ANGIO VERTEBRAL SEL VERTEBRAL UNI L MOD SED IR ANGIO INTRA EXTRACRAN SEL COM CAROTID INNOMINATE UNI R MOD SED IR ANGIOGRAM FOLLOW UP STUDY     Patient location during evaluation: PACU Anesthesia Type: General Level of consciousness: awake and patient cooperative Pain management: pain level controlled Vital Signs Assessment: post-procedure vital signs reviewed and stable Respiratory status: spontaneous breathing, nonlabored ventilation, respiratory function stable and patient connected to nasal cannula oxygen Cardiovascular status: blood pressure returned to baseline and stable Postop Assessment: no apparent nausea or vomiting Anesthetic complications: no   No notable events documented.  Last Vitals:  Vitals:   07/02/22 0912 07/02/22 1100  BP: (!) 163/89 (!) 156/66  Pulse: 83 (!) 47  Resp:  (!) 21  Temp:    SpO2:  94%    Last Pain:  Vitals:   07/02/22 0800  TempSrc: Axillary  PainSc:                  Madelyne Millikan

## 2022-07-10 ENCOUNTER — Encounter: Payer: Self-pay | Admitting: Internal Medicine

## 2022-07-10 ENCOUNTER — Ambulatory Visit: Payer: Medicare Other | Admitting: Cardiology

## 2022-07-10 ENCOUNTER — Ambulatory Visit: Payer: Medicare Other | Admitting: Internal Medicine

## 2022-07-10 VITALS — BP 189/74 | HR 59 | Temp 98.5°F | Resp 16 | Ht 71.0 in | Wt 182.0 lb

## 2022-07-10 DIAGNOSIS — I4891 Unspecified atrial fibrillation: Secondary | ICD-10-CM | POA: Insufficient documentation

## 2022-07-10 DIAGNOSIS — I10 Essential (primary) hypertension: Secondary | ICD-10-CM

## 2022-07-10 DIAGNOSIS — Z952 Presence of prosthetic heart valve: Secondary | ICD-10-CM

## 2022-07-10 MED ORDER — AMLODIPINE BESYLATE 10 MG PO TABS: 10.0000 mg | ORAL_TABLET | Freq: Every day | ORAL | 2 refills | Status: DC

## 2022-07-10 NOTE — Progress Notes (Signed)
ID:  Nicholas Shelton, DOB [birth year], MRN [id number]  PCP:  Christain Sacramento, MD  Cardiologist:  Elliot Gault, DO, [facility] (established care 12/13/2020) Cardiac Electrophysiologist: Dr. Lars Mage  Date: 05/29/22 Last Office Visit: 05/15/2022  Chief Complaint  Patient presents with   Congestive Heart Failure   Hypertension   Follow-up    6 weeks    HPI  Nicholas Shelton is a 81 y.o. male whose past medical history and cardiovascular risk factors include: Hx of aortic stenosis s/p 14mm Edward Sapien 3 Valve, heart failure with reduced EF, retinal artery branch occlusion, paroxysmal atrial fibrillation/flutter, saccular left PCOM aneurysm, hypertension, hyperlipidemia, non-insulin-dependent diabetes mellitus type 2, former smoker, advanced age.  Patient is here for blood pressure management. BP still running high. Patient still not willing to get sleep study but he will consider it.    ALLERGIES: Allergies  Allergen Reactions   Tape     Pulls skin off   Trazodone And Nefazodone Other (See Comments)    Sluggishness the next morning    MEDICATION LIST PRIOR TO VISIT: Current Meds  Medication Sig   acetaminophen (TYLENOL) 500 MG tablet Take 500-1,000 mg by mouth every 8 (eight) hours as needed for moderate pain.   amLODipine (NORVASC) 10 MG tablet Take 1 tablet (10 mg total) by mouth daily.   apixaban (ELIQUIS) 5 MG TABS tablet Take 1 tablet (5 mg total) by mouth 2 (two) times daily.   aspirin EC 81 MG tablet Take 81 mg by mouth daily. Swallow whole.   atorvastatin (LIPITOR) 10 MG tablet Take 10 mg by mouth daily.   bumetanide (BUMEX) 1 MG tablet Take 1 tablet (1 mg total) by mouth every morning.   cloNIDine (CATAPRES) 0.3 MG tablet Take 1 tablet (0.3 mg total) by mouth 3 (three) times daily.   clopidogrel (PLAVIX) 75 MG tablet Take 1 tablet (75 mg total) by mouth daily.   dapagliflozin propanediol (FARXIGA) 10 MG TABS tablet Take 1 tablet (10 mg total) by mouth daily.   hydrALAZINE  (APRESOLINE) 100 MG tablet Take 1 tablet (100 mg total) by mouth 3 (three) times daily.   isosorbide dinitrate (ISORDIL) 20 MG tablet TAKE 2 TABLETS BY MOUTH THREE TIMES DAILY   metFORMIN (GLUCOPHAGE) 1000 MG tablet Take 1 tablet (1,000 mg total) by mouth 2 (two) times daily with a meal.   metoprolol succinate (TOPROL-XL) 25 MG 24 hr tablet Take 1 tablet (25 mg total) by mouth daily.   Multiple Vitamin (MULTI VITAMIN MENS PO) Take 1 tablet by mouth daily.   pantoprazole (PROTONIX) 40 MG tablet Take 40 mg by mouth daily before breakfast.   Psyllium (METAMUCIL PO) Take 1 Dose by mouth daily as needed (constipation).   sacubitril-valsartan (ENTRESTO) 97-103 MG Take 1 tablet by mouth 2 (two) times daily.     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    PAST SURGICAL HISTORY: Past Surgical History:  Procedure Laterality Date   CARDIAC CATHETERIZATION     COLONOSCOPY     ELBOW SURGERY     pinched  nerve on both arms   INGUINAL HERNIA REPAIR Right 10/30/2018   Procedure: OPEN REPAIR OF INCARCERATED RIGHT INGUINAL HERNIA WITH MESH;  Surgeon: Greer Pickerel, MD;  Location: [place];  Service: General;  Laterality: Right;   IR 3D INDEPENDENT WKST  06/30/2022   IR ANGIO INTRA EXTRACRAN SEL COM CAROTID INNOMINATE UNI R MOD SED  06/30/2022   IR ANGIO INTRA EXTRACRAN SEL INTERNAL CAROTID UNI L MOD SED  06/30/2022   IR ANGIO VERTEBRAL SEL VERTEBRAL UNI L MOD SED  06/30/2022   IR ANGIOGRAM FOLLOW UP STUDY  07/02/2022   IR CT HEAD LTD  06/30/2022   IR TRANSCATH/EMBOLIZ  06/30/2022   IR US GUIDE VASC ACCESS RIGHT  06/30/2022   IR US GUIDE VASC ACCESS RIGHT  06/30/2022   JOINT REPLACEMENT     LEFT HEART CATH AND CORONARY ANGIOGRAPHY N/A 01/15/2021   Procedure: LEFT HEART CATH AND CORONARY ANGIOGRAPHY;  Surgeon: Adrian Prows, MD;  Location: [place] [facility];  Service: Cardiovascular;  Laterality: N/A;   RADIOLOGY WITH ANESTHESIA N/A 06/30/2022   Procedure: Jake Bathe;  Surgeon: [name], MD;  Location: [place];  Service: Radiology;  Laterality: N/A;   TEE WITHOUT CARDIOVERSION N/A 12/24/2021   Procedure: TRANSESOPHAGEAL ECHOCARDIOGRAM (TEE);  Surgeon: Sherren Mocha, MD;  Location: [place];  Service: Open Heart Surgery;  Laterality: N/A;   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  SOCIAL HISTORY:  The patient  reports that he quit smoking about 27 years ago. His smoking use included cigarettes. He has a 7.50 pack-year smoking history. He has never used smokeless tobacco. He reports that he does not drink alcohol and does not use drugs.  REVIEW OF SYSTEMS: Review of Systems  Constitutional: Negative for malaise/fatigue.   HENT:         Hard of hearing  Cardiovascular:  Negative for chest pain, dyspnea on exertion, leg swelling, near-syncope, orthopnea, palpitations, paroxysmal nocturnal dyspnea and syncope.  Respiratory:  Negative for shortness of breath.   Neurological:  Negative for dizziness, focal weakness and light-headedness.    PHYSICAL EXAM:    07/10/2022   12:54 PM 07/10/2022   12:50 PM 07/02/2022   11:00 AM  Vitals with BMI  Height  $Remov'5\' 11"'[id number]$    Weight  182 lbs   BMI  67.6   Systolic 195 093 267  Diastolic 74 65 66  Pulse 59 61 47   CONSTITUTIONAL: Well-developed and well-nourished. No acute distress.  CHEST Normal respiratory effort. No intercostal retractions  LUNGS: Clear to auscultation bilaterally. No stridor. No wheezes. No rales.  CARDIOVASCULAR: Regular, S1-S2, no murmurs, rubs or gallops appreciated. ABDOMINAL: Soft, nontender, nondistended, positive bowel sounds in all 4 quadrants, no apparent ascites.  EXTREMITIES: no peripheral edema,  warm to touch.   RADIOLOGY:  MRI brain without contrast: 02/19/2022 1. No acute intracranial abnormality. 2. Moderate chronic small vessel ischemic disease. 3. 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  MRA head without contrast: 02/19/2022 1. Negative intracranial MRA for large vessel occlusion or other emergent finding. 2. Mild intracranial atherosclerotic disease without hemodynamically significant or correctable stenosis. 3. 4 mm saccular left PCOM aneurysm. This would likely be amendable to endovascular treatment. Consultation with neuro interventional radiology suggested.  CARDIAC DATABASE: EKG: 10/31/2021: Probable normal sinus rhythm cannot rule out ectopic atrial rhythm, IVCD suggestive of RBBB, diffuse nonspecific T wave abnormality.  No significant change compared to prior ECG.   01/29/2022: Sinus bradycardia, 48 bpm, first-degree AV block, occasional PVCs.    03/03/22: Atrial fibrillation, 52 bpm, diffuse  nonspecific T wave abnormality.  Compared to prior ECG A-fib is new.  04/04/2022: Atrial tachycardia with escape junctional rhythm.  Echocardiogram: 06/18/2021:  Normal LV systolic function with visual EF 50-55%. Left ventricle cavity is normal in size. Mild left ventricular hypertrophy. Normal global wall motion. Doppler evidence of grade II (pseudonormal) diastolic dysfunction,  elevated LAP.  Left atrial cavity is mildly dilated.  Trileaflet aortic valve with no regurgitation. Severe aortic valve leaflet calcification. Moderate aortic valve stenosis. Peak velocity 3.19m/s, Peak Gradient 39.8 mmHg, Mean Gradient 27 mmHg, AVA 0.8cm, DI 0.3.  Mild (Grade I) mitral regurgitation.  Moderate tricuspid regurgitation. Mild pulmonary hypertension. RVSP measures 49 mmHg.  Insignificant pericardial effusion. There is no hemodynamic significance.  IVC is dilated with a respiratory response of >50%.  Compared to study 12/06/2020: LVEF was 55-60% and now 50-55%, AS remains moderate, RVSP was 70mmHg and now 29mmHg.  10/21/2021:  1. Compared to echo report from September 2022, LVEF is depressed and AS is severe.   2. Global longitudinal strain is -13.4%. Left ventricular ejection fraction, by estimation, is 40%. The left ventricle has moderately decreased function. The left ventricle demonstrates global hypokinesis. The left ventricular internal cavity size was mildly dilated. There is mild left ventricular hypertrophy.   3. Right ventricular systolic function is mildly reduced. The right ventricular size is normal.   4. Left atrial size was moderately dilated.   5. Right atrial size was mildly dilated.   6. Mild mitral valve regurgitation.   7. AV is thickened, calcified with restricted motion motion. Peak and mean gradients through the valve are 48 and 30 mm Hg respectively. AVA (VTI) is 0.99cm2. Dimensionless index is 0.25 All consistent with severe  AS. Marland Kitchen Aortic valve regurgitation is not  visualized.    8. The inferior vena cava is dilated in size with <50% respiratory variability, suggesting right atrial pressure of 15 mmHg.  01/22/2022: LVEF 35-40%, global hypokinesis, mild LVH, grade 2 diastolic dysfunction, elevated LVEDP, RV size and function normal, severely elevated PASP, severely dilated left atrium, moderately dilated right atrium, mild MR, 21 9 mm SAPIEN 3 bioprosthetic aortic valve present, peak velocity 1.95 m/s, mean gradient 8.3 mmHg, AVA per VTI 1.97 cm, estimated RAP 8 mmHg.   Stress Testing: Lexiscan Tetrofosmin Stress Test 12/24/2020: Nondiagnostic ECG stress. The left ventricle is dilated in stress images more than rest images, LV end-diastolic volume 211 mL. There is mild diaphragmatic attenuation artifact in the inferior wall.  Superimposed on this there is a moderate-sized moderate decrease in counts in the basal inferior and inferolateral wall suggestive of ischemia. Gated SPECT imaging of the left ventricle was abnormal, demonstrating hypokinesis of the mid inferolateral wall, mid inferior wall and basal inferolateral wall. Stress LV EF is mildly dysfunctional 48%.  No previous exam available for comparison. High risk study.   Left heart catheterization 01/15/2021: LV: Normal LV systolic function.  EDP mildly elevated at 20 mmHg.  There is 22 mmHg peak to peak pressure gradient across the aortic valve.  Findings are consistent with mild aortic stenosis. Very mild coronary calcification and minimal luminal irregularity in the coronary vessels.  Right dominant circulation. Impression: No significant coronary disease angiography.  Coronary calcification is evident.  Normal LVEF and mild aortic stenosis. 50 mL contrast used.  Carotid artery duplex 12/19/2020: Minimal stenosis in the right internal carotid artery (1-15%). Doppler velocity suggests stenosis in the right Minimal stenosis in the left internal carotid artery (1-15%). Doppler velocity suggests stenosis in the left  external carotid artery (<50%). Mild heterogeneous plaque noted bilaterally.  Antegrade right vertebral artery flow. Antegrade left vertebral artery flow. External carotid artery stenosis is probably source of bruit. Follow up study is appropriate if clinically indicated.  Renal duplex: 03/23/2022 Renal:  Right: Normal size right kidney. Normal right Resisitive Index. No evidence of right renal artery stenosis. RRV flow present.         Cyst(s) noted.  Left:  Normal size of left kidney. Abnormal left Resisitve Index. 1-59% stenosis of the left renal artery. LRV flow present.         Cyst(s) noted- largest measuring 7.4 x 6.6 x 6.7 cm.  Mesenteric:  70 to 99% stenosis in the superior mesenteric artery.   LABORATORY DATA: External Labs: Collected: 11/29/2020 Creatinine 0.88 mg/dL. eGFR: 86 mL/min per 1.73 m Lipid profile: Total cholesterol 186, triglycerides 131, HDL 39, LDL 116, non HDL 147 Hemoglobin A1c: 7  External Labs: Collected: 04/03/2022 at [facility]. Hemoglobin 9.7 g/dL, hematocrit 30% Sodium 135, potassium four 5.2, chloride 104, bicarb 24, eGFR 57   LABORATORY DATA:    Latest Ref Rng & Units 06/30/2022    7:46 AM 05/15/2022    2:32 PM 03/26/2022    3:48 AM  CBC  WBC 4.0 - 10.5 K/uL 3.8   4.4   Hemoglobin 13.0 - 17.0 g/dL 10.1  10.5  9.6   Hematocrit 39.0 - 52.0 % 32.7  31.6  28.6   Platelets 150 - 400 K/uL 92   108        Latest Ref Rng & Units 06/30/2022    7:46 AM 05/23/2022   10:54 AM 05/15/2022    2:32 PM  CMP  Glucose 70 - 99 mg/dL 124  130  117   BUN 8 - 23 mg/dL 45  27  23   Creatinine 0.61 - 1.24 mg/dL 1.49  1.31  1.25   Sodium 135 - 145 mmol/L 137  139  140   Potassium 3.5 - 5.1 mmol/L 4.5  4.0  4.8   Chloride 98 - 111 mmol/L 104  102  106   CO2 22 - 32 mmol/L _0 Calcium 8.9 - 10.3 mg/dL 8.8  8.6  8.9   Total Protein 6.0 - 8.5 g/dL   6.3   Total Bilirubin 0.0 - 1.2 mg/dL   0.5   Alkaline Phos 44 - 121  IU/L  173   AST 0 - 40 IU/L   20   ALT 0 - 44 IU/L   24     Lipid Panel  Lab Results  Component Value Date   CHOL 125 03/23/2022   HDL 51 03/23/2022   LDLCALC 61 03/23/2022   TRIG 120 07/01/2022   CHOLHDL 2.5 03/23/2022     No components found for: "NTPROBNP" Recent Labs    11/12/21 0937 12/30/21 1205 03/03/22 1151 03/14/22 1250 05/15/22 1432 05/23/22 1057  PROBNP 1,918* 2,011* 4,258* 3,403* 6,876* 6,408*   Recent Labs    03/03/22 1151 03/24/22 1419  TSH 0.602 0.532    BMP Recent Labs    03/25/22 0409 03/26/22 0348 05/15/22 1432 05/23/22 1054 06/30/22 0746  NA 133* 134* 140 139 137  K 3.2* 4.0 4.8 4.0 4.5  CL 99 101 106 102 104  CO2 _0 20*  GLUCOSE 121* 102* 117* 130* 124*  BUN 22 30* 23 27 45*  CREATININE 1.34* 1.50* 1.25 1.31* 1.49*  CALCIUM 7.9* 8.0* 8.9 8.6 8.8*  GFRNONAA 53* 46*  --   --  47*    HEMOGLOBIN A1C Lab Results  Component Value Date   HGBA1C 6.2 (H) 02/19/2022   MPG 131.24 02/19/2022    Hepatic Function Panel Recent Labs    03/22/22 1200 03/23/22 0442 05/15/22 1432  PROT 6.6 5.8* 6.3  ALBUMIN 3.9 3.2* 4.0  AST _1 ALT _2 ALKPHOS 82 71 173*  BILITOT 0.9 1.2 0.5     IMPRESSION:    ICD-10-CM   1. Essential hypertension  I10     2. Atrial fibrillation, unspecified type ([employer])  I48.91     3. S/P TAVR (transcatheter aortic valve replacement)  Z95.2     4. Resistant hypertension  I10        RECOMMENDATIONS: Dailen Mcclish is a 81 y.o. male whose past medical history and cardiac risk factors include: Hx of aortic stenosis s/p 48m Edward Sapien 3 Valve, heart failure with reduced EF, retinal artery branch occlusion, paroxysmal atrial fibrillation/flutter, saccular left PCOM aneurysm, hypertension, hyperlipidemia, non-insulin-dependent diabetes mellitus type 2, former smoker, advanced age.    Resistant hypertension Office and home blood pressures have improved.  Has refused to undergo sleep study  in the past -  he will consider it if BP remains very high Renal duplex results of the left renal artery stenosis <60%.  On multiple antihypertensive medications as outlined above.  Will add 128mamlodipine as BP is still uncontrolled    Atrial fibrillation One month supply of Eliquis given to patient $10 co-pay card for Eliquis given as well Pharmacist discussing future payment options post visit    FINAL MEDICATION LIST END OF ENCOUNTER: Meds ordered this encounter  Medications   amLODipine (NORVASC) 10 MG tablet    Sig: Take 1 tablet (10 mg total) by mouth daily.    Dispense:  30 tablet    Refill:  2     Current Outpatient Medications:    acetaminophen (TYLENOL) 500 MG tablet, Take 500-1,000 mg by mouth every 8 (eight) hours as needed for moderate pain., Disp: , Rfl:    amLODipine (NORVASC) 10 MG tablet, Take 1 tablet (10 mg total) by mouth daily., Disp: 30 tablet, Rfl: 2   apixaban (ELIQUIS) 5 MG TABS tablet, Take 1 tablet (5 mg total) by mouth 2 (two) times daily., Disp: 180 tablet, Rfl: 3   aspirin EC 81 MG tablet, Take 81  mg by mouth daily. Swallow whole., Disp: , Rfl:    atorvastatin (LIPITOR) 10 MG tablet, Take 10 mg by mouth daily., Disp: , Rfl:    bumetanide (BUMEX) 1 MG tablet, Take 1 tablet (1 mg total) by mouth every morning., Disp: 90 tablet, Rfl: 0   cloNIDine (CATAPRES) 0.3 MG tablet, Take 1 tablet (0.3 mg total) by mouth 3 (three) times daily., Disp: 270 tablet, Rfl: 0   clopidogrel (PLAVIX) 75 MG tablet, Take 1 tablet (75 mg total) by mouth daily., Disp: 30 tablet, Rfl: 1   dapagliflozin propanediol (FARXIGA) 10 MG TABS tablet, Take 1 tablet (10 mg total) by mouth daily., Disp: 90 tablet, Rfl: 3   hydrALAZINE (APRESOLINE) 100 MG tablet, Take 1 tablet (100 mg total) by mouth 3 (three) times daily., Disp: 270 tablet, Rfl: 2   isosorbide dinitrate (ISORDIL) 20 MG tablet, TAKE 2 TABLETS BY MOUTH THREE TIMES DAILY, Disp: 540 tablet, Rfl: 0   metFORMIN (GLUCOPHAGE)  1000 MG tablet, Take 1 tablet (1,000 mg total) by mouth 2 (two) times daily with a meal., Disp: , Rfl:    metoprolol succinate (TOPROL-XL) 25 MG 24 hr tablet, Take 1 tablet (25 mg total) by mouth daily., Disp: 90 tablet, Rfl: 3   Multiple Vitamin (MULTI VITAMIN MENS PO), Take 1 tablet by mouth daily., Disp: , Rfl:    pantoprazole (PROTONIX) 40 MG tablet, Take 40 mg by mouth daily before breakfast., Disp: , Rfl:    Psyllium (METAMUCIL PO), Take 1 Dose by mouth daily as needed (constipation)., Disp: , Rfl:    sacubitril-valsartan (ENTRESTO) 97-103 MG, Take 1 tablet by mouth 2 (two) times daily., Disp: , Rfl:   No orders of the defined types were placed in this encounter.  There are no Patient Instructions on file for this visit.    Floydene Flock, DO, [facility]  Pager: [phone] Office: [phone]

## 2022-07-18 ENCOUNTER — Other Ambulatory Visit: Payer: Self-pay | Admitting: Cardiology

## 2022-07-23 ENCOUNTER — Other Ambulatory Visit (HOSPITAL_COMMUNITY): Payer: Self-pay

## 2022-07-23 ENCOUNTER — Other Ambulatory Visit (HOSPITAL_COMMUNITY)
Admission: RE | Admit: 2022-07-23 | Discharge: 2022-07-23 | Disposition: A | Discharge status: Home / Self Care | Payer: Medicare Other | Attending: Neuroradiology | Admitting: Neuroradiology

## 2022-07-23 DIAGNOSIS — I729 Aneurysm of unspecified site: Secondary | ICD-10-CM | POA: Insufficient documentation

## 2022-08-01 ENCOUNTER — Other Ambulatory Visit: Payer: Self-pay | Admitting: Cardiology

## 2022-08-01 DIAGNOSIS — I10 Essential (primary) hypertension: Secondary | ICD-10-CM

## 2022-08-04 ENCOUNTER — Ambulatory Visit (HOSPITAL_COMMUNITY)
Admission: RE | Admit: 2022-08-04 | Discharge: 2022-08-04 | Disposition: A | Discharge status: Home / Self Care | Payer: Medicare Other | Source: Ambulatory Visit | Attending: Student | Admitting: Student

## 2022-08-04 DIAGNOSIS — I671 Cerebral aneurysm, nonruptured: Secondary | ICD-10-CM

## 2022-08-04 MED ORDER — ASPIRIN 81 MG PO TBEC: 81.0000 mg | DELAYED_RELEASE_TABLET | Freq: Every day | ORAL | 12 refills | Status: DC

## 2022-08-04 MED ORDER — ASPIRIN 81 MG PO CHEW: 81.0000 mg | CHEWABLE_TABLET | Freq: Every day | ORAL | Status: DC

## 2022-08-04 NOTE — Progress Notes (Signed)
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  History of present illness:  Nicholas Shelton is an 81 year old male with past medical history significant for hypertension, hyperlipidemia, TAVR, gout, generalized anxiety disorder, diabetes and bioprosthetic aortic valve. 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 He then presented to the emergency room for stroke workup. MRI and MRA of the brain were obtained and were negative for acute stroke. 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  He presented for diagnostic cerebral angiogram with elective embolization of the left Pcom aneurysm on 06/30/2022. Surprisingly, diagnostic angiogram showed an additional 5 mm blister like aneurysm of the ophthalmic segment of the left ICA. Due to it's morphology, this aneurysm was considered uncoilable. 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 Given close proximity of the 2 aneurysms, decision was made to proceed with endovascular embolization of both aneurysms with with diversion technology. Two partially overlapping Surpass Evolve flow diverters were placed in the intracranial left ICA, across the neck of both aneurysms. No periprocedural complication was noted.  [name] has been doing well since the procedure, without any new neurological complaints. He is accompanied by his sister in law. He has been taking ASA, plavix and eliquis. He is having eye surgery on Friday.  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   Degeneration of lumbar or lumbosacral  intervertebral disc    Deviated nasal septum    Diabetes mellitus without complication (HCC)    Gastroesophageal reflux disease without esophagitis    Generalized anxiety disorder    Gout with tophus    Hearing loss    Hyperlipidemia    Hypertension    Insomnia    Murmur    Aortic stenosis   Nasal turbinate hypertrophy    Nonischemic cardiomyopathy (HCC)    Osteoarthritis of wrist    PAC (premature atrial contraction)    PAF (paroxysmal atrial fibrillation) ([place]) 03/03/2022   Peptic ulcer    Peripheral neuropathy    PVC (premature ventricular contraction)    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    Past Surgical History:  Procedure Laterality Date   CARDIAC CATHETERIZATION     COLONOSCOPY     ELBOW SURGERY     pinched nerve on both arms   INGUINAL HERNIA REPAIR Right 10/30/2018   Procedure: OPEN REPAIR OF INCARCERATED RIGHT INGUINAL HERNIA WITH MESH;  Surgeon: WGreer Pickerel MD;  [facility]  Service: General;  Laterality: Right;   IR 3D INDEPENDENT WKST  06/30/2022   IR ANGIO INTRA EXTRACRAN SEL COM CAROTID INNOMINATE UNI R MOD SED  06/30/2022   IR ANGIO INTRA EXTRACRAN SEL INTERNAL CAROTID UNI L MOD SED  06/30/2022   IR ANGIO VERTEBRAL SEL VERTEBRAL UNI L MOD SED  06/30/2022   IR ANGIOGRAM FOLLOW UP STUDY  07/02/2022   IR  CT HEAD LTD  06/30/2022   IR TRANSCATH/EMBOLIZ  06/30/2022   IR US GUIDE VASC ACCESS RIGHT  06/30/2022   IR US GUIDE VASC ACCESS RIGHT  06/30/2022   JOINT REPLACEMENT     LEFT HEART CATH AND CORONARY ANGIOGRAPHY N/A 01/15/2021   Procedure: LEFT HEART CATH AND CORONARY ANGIOGRAPHY;  Surgeon: Adrian Prows, MD;  Location: [facility];  Service: Cardiovascular;  Laterality: N/A;   RADIOLOGY WITH ANESTHESIA N/A 06/30/2022   Procedure:  Jake Bathe;  Surgeon: [name], MD;  Location: [facility];  Service: Radiology;  Laterality: N/A;   TEE WITHOUT CARDIOVERSION N/A 12/24/2021   Procedure: TRANSESOPHAGEAL ECHOCARDIOGRAM (TEE);  Surgeon: Sherren Mocha, MD;  Location: [facility];  Service: Open Heart Surgery;  Laterality: N/A;   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   Procedure: TOTAL KNEE ARTHROPLASTY;  Surgeon: Sydnee Cabal, MD;  Location: [facility];  Service: Orthopedics;  Laterality: Right;  with adductor canal   WRIST SURGERY     pinched nerve    Allergies: Tape and Trazodone and nefazodone  Medications: Prior to Admission medications   Medication Sig Start Date End Date Taking? Authorizing Provider  acetaminophen (TYLENOL) 500 MG tablet Take 500-1,000 mg by mouth every 8 (eight) hours as needed for moderate pain.    [provider]  amLODipine (NORVASC) 10 MG tablet Take 1 tablet (10 mg total) by mouth daily. 07/10/22 10/08/22  Custovic, Collene Mares, DO  apixaban (ELIQUIS) 5 MG TABS tablet Take 1 tablet (5 mg total) by mouth 2 (two) times daily. 06/17/22   Tolia, Sunit, DO  aspirin EC 81 MG tablet Take 81 mg by mouth daily. Swallow whole.    [provider]  atorvastatin (LIPITOR) 10 MG tablet Take 10 mg by mouth daily. 02/25/22   [provider]  bumetanide (BUMEX) 1 MG tablet Take 1 tablet (1 mg total) by mouth every morning. 05/16/22 08/14/22  Tolia, Sunit, DO  cloNIDine (CATAPRES) 0.3 MG tablet TAKE 1 TABLET(0.3 MG) BY MOUTH THREE TIMES DAILY 08/01/22   Tolia, Sunit, DO  clopidogrel (PLAVIX) 75 MG tablet Take 1 tablet (75 mg total) by mouth daily. 03/27/22   Ezequiel Essex, MD  dapagliflozin propanediol (FARXIGA) 10 MG TABS tablet Take 1 tablet (10 mg total) by mouth daily. 05/27/22   Tolia, Sunit, DO  hydrALAZINE (APRESOLINE) 100 MG tablet Take 1 tablet (100 mg total) by mouth 3 (three) times daily. 02/17/22   Tolia, Sunit, DO  isosorbide dinitrate (ISORDIL) 20 MG tablet TAKE 2 TABLETS  BY MOUTH THREE TIMES DAILY 07/18/22   Tolia, Sunit, DO  metFORMIN (GLUCOPHAGE) 1000 MG tablet Take 1 tablet (1,000 mg total) by mouth 2 (two) times daily with a meal. 01/17/21   Adrian Prows, MD  metoprolol succinate (TOPROL-XL) 25 MG 24 hr tablet Take 1 tablet (25 mg total) by mouth daily. 05/07/22   Tolia, Sunit, DO  Multiple Vitamin (MULTI VITAMIN MENS PO) Take 1 tablet by mouth daily.    [provider]  pantoprazole (PROTONIX) 40 MG tablet Take 40 mg by mouth daily before breakfast.    [provider]  Psyllium (METAMUCIL PO) Take 1 Dose by mouth daily as needed (constipation).    [provider]  sacubitril-valsartan (ENTRESTO) 97-103 MG Take 1 tablet by mouth 2 (two) times daily.    [provider]     Family History  Problem Relation Age of Onset   Heart attack Mother     Social History   Socioeconomic History  Marital status: Married    Spouse name: Not on file   Number of children: 3   Years of education: Not on file   Highest education level: Not on file  Occupational History   Not on file  Tobacco Use   Smoking status: Former    Packs/day: 0.50    Years: 15.00    Total pack years: 7.50    Types: Cigarettes    Quit date: 34    Years since quitting: 27.7   Smokeless tobacco: Never  Vaping Use   Vaping Use: Never used  Substance and Sexual Activity   Alcohol use: No   Drug use: No   Sexual activity: Not on file    Comment: married  Other Topics Concern   Not on file  Social History Narrative   Not on file   Social Determinants of Health   Financial Resource Strain: Not on file  Food Insecurity: Not on file  Transportation Needs: Not on file  Physical Activity: Not on file  Stress: Not on file  Social Connections: Not on file     Vital Signs: There were no vitals taken for this visit.  Physical Exam HENT:     Head: Normocephalic and atraumatic.  Eyes:     Extraocular Movements: Extraocular movements intact.      Pupils: Pupils are equal, round, and reactive to light.  Neurological:     General: No focal deficit present.     Mental Status: He is alert and oriented to person, place, and time. Mental status is at baseline.     Imaging: No results found.  Labs:  CBC: Recent Labs    03/23/22 0442 03/25/22 0409 03/26/22 0348 05/15/22 1432 06/30/22 0746  WBC 5.8 4.4 4.4  --  3.8*  HGB 10.0* 9.3* 9.6* 10.5* 10.1*  HCT 29.0* 27.3* 28.6* 31.6* 32.7*  PLT 108* 102* 108*  --  92*    COAGS: Recent Labs    12/20/21 1116 02/19/22 1003 06/30/22 0805  INR 1.0 1.0 1.1  APTT  --  31  --     BMP: Recent Labs    03/24/22 0955 03/25/22 0409 03/26/22 0348 05/15/22 1432 05/23/22 1054 06/30/22 0746  NA 132* 133* 134* 140 139 137  K 3.3* 3.2* 4.0 4.8 4.0 4.5  CL 100 99 101 106 102 104  CO2 '24 26 25 21 21 '$ 20*  GLUCOSE 191* 121* 102* 117* 130* 124*  BUN 17 22 30* 23 27 45*  CALCIUM 8.2* 7.9* 8.0* 8.9 8.6 8.8*  CREATININE 1.21 1.34* 1.50* 1.25 1.31* 1.49*  GFRNONAA 60* 53* 46*  --   --  47*    LIVER FUNCTION TESTS: Recent Labs    03/03/22 1151 03/22/22 1200 03/23/22 0442 05/15/22 1432  BILITOT 0.4 0.9 1.2 0.5  AST '21 21 18 20  '$ ALT '18 30 22 24  '$ ALKPHOS 110 82 71 173*  PROT 6.8 6.6 5.8* 6.3  ALBUMIN 4.1 3.9 3.2* 4.0    Assessment:  Mr. Philipp has been doing well since his endovascular aneurysm embolization without evidence of new neurological event. At this point, patient may discontinue one anti-platelet agent while on Eliquis. I have instructed him to stop taking the plavix while continuing taking the eliquis and aspirin. We will obtain a follow-up MRA within 6 moths from the intervention. All questions were answered to their satisfaction.   Signed: [name], MD 08/04/2022, 1:51 PM    I spent a total of  25 Minutes in face to face in clinical consultation, greater than 50% of which was counseling/coordinating care for cerebral aneurysm.

## 2022-08-06 ENCOUNTER — Other Ambulatory Visit: Payer: Self-pay | Admitting: Cardiology

## 2022-08-06 DIAGNOSIS — I1A0 Resistant hypertension: Secondary | ICD-10-CM

## 2022-08-06 DIAGNOSIS — I10 Essential (primary) hypertension: Secondary | ICD-10-CM

## 2022-08-08 ENCOUNTER — Other Ambulatory Visit: Payer: Self-pay | Admitting: Cardiology

## 2022-08-08 DIAGNOSIS — I5021 Acute systolic (congestive) heart failure: Secondary | ICD-10-CM

## 2022-08-10 HISTORY — PX: CATARACT EXTRACTION: SUR2

## 2022-08-14 ENCOUNTER — Other Ambulatory Visit: Payer: Self-pay | Admitting: Cardiology

## 2022-08-14 DIAGNOSIS — I5021 Acute systolic (congestive) heart failure: Secondary | ICD-10-CM

## 2022-08-21 ENCOUNTER — Ambulatory Visit: Payer: Medicare Other | Admitting: Cardiology

## 2022-09-01 ENCOUNTER — Telehealth: Payer: Self-pay | Admitting: Cardiology

## 2022-09-01 NOTE — Telephone Encounter (Signed)
Patient has received samples from family member who picked them up today.

## 2022-09-01 NOTE — Telephone Encounter (Signed)
Patient's wife asking for samples of eliquis. If so, can someone leave them up front for them to pick up.

## 2022-09-02 NOTE — Telephone Encounter (Signed)
This was a mistake, patient confused medication with [place]. Samples are at the nurses station. Spoke with Bethena Roys.

## 2022-09-04 NOTE — Telephone Encounter (Signed)
Nicholas Shelton has picked up the samples.

## 2022-09-08 ENCOUNTER — Encounter: Payer: Self-pay | Admitting: Cardiology

## 2022-09-08 ENCOUNTER — Ambulatory Visit: Payer: Medicare Other | Admitting: Cardiology

## 2022-09-08 VITALS — BP 162/62 | HR 56 | Ht 71.0 in | Wt 187.4 lb

## 2022-09-08 DIAGNOSIS — E1165 Type 2 diabetes mellitus with hyperglycemia: Secondary | ICD-10-CM

## 2022-09-08 DIAGNOSIS — E782 Mixed hyperlipidemia: Secondary | ICD-10-CM

## 2022-09-08 DIAGNOSIS — I1A0 Resistant hypertension: Secondary | ICD-10-CM

## 2022-09-08 DIAGNOSIS — Z8673 Personal history of transient ischemic attack (TIA), and cerebral infarction without residual deficits: Secondary | ICD-10-CM

## 2022-09-08 DIAGNOSIS — Z87891 Personal history of nicotine dependence: Secondary | ICD-10-CM

## 2022-09-08 DIAGNOSIS — Z7901 Long term (current) use of anticoagulants: Secondary | ICD-10-CM

## 2022-09-08 DIAGNOSIS — I428 Other cardiomyopathies: Secondary | ICD-10-CM

## 2022-09-08 DIAGNOSIS — I4892 Unspecified atrial flutter: Secondary | ICD-10-CM

## 2022-09-08 DIAGNOSIS — Z952 Presence of prosthetic heart valve: Secondary | ICD-10-CM

## 2022-09-08 DIAGNOSIS — I5022 Chronic systolic (congestive) heart failure: Secondary | ICD-10-CM

## 2022-09-08 NOTE — Progress Notes (Signed)
ID:  Nicholas Shelton, DOB Jun 16, 1941, MRN [id number]  PCP:  Christain Sacramento, MD  Cardiologist:  Elliot Gault, DO, [facility] (established care 12/13/2020) Cardiac Electrophysiologist: Dr. Lars Mage  Date: 09/08/22 Last Office Visit: 05/29/2022  Chief Complaint  Patient presents with   Follow-up    53-monthfollow-up for HFrEF, atrial flutter, hypertension    HPI  [name] a 81y.o. male whose past medical history and cardiovascular risk factors include: Hx of aortic stenosis s/p 229mEdward Sapien 3 Valve, heart failure with reduced EF, retinal artery branch occlusion, paroxysmal atrial fibrillation/flutter, saccular left PCOM aneurysm (s/p  endovascular aneurysm embolization of 4 mm supraclinoid left ICA aneurysm and 5 mm blister like aneurysm of the ophthalmic segment of the left ICA), hypertension, hyperlipidemia, non-insulin-dependent diabetes mellitus type 2, former smoker, advanced age.  His aortic stenosis was being managed medically until his LVEF was reduced and started having symptoms of CHF.  He had a 29 mm Edwards SAPIEN 3 valve implanted.  Thereafter he remained on medical therapy.  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  Upon further investigating he was noted to have atrial flutter and was started on medical therapy.  Given the fact that he has underlying conduction disease additional rate control and antiarrhythmic medications like to transient complete heart block.  His amiodarone was discontinued and the dose of metoprolol reduced.  He has been evaluated by EP in the past who recommended continued medical therapy with rate control strategy and anticoagulation for thromboembolic prophylaxis.  Eventually at some point he may require device therapy if his conduction disease were to progress.  Since last office visit he is undergone  endovascular repair of his ICA aneurysm.  He is also undergoing both right and left cataract surgery the last 1 being on 09/05/2022.  Remote patient monitoring results reviewed.  His average blood pressure is 138/66 with a pulse of 44 bpm.  His SBP ranges between 120-168 mmHg.  He is doing well on current medical therapy.  However due to his bradycardia we discussed potential medication changes.  He denies any symptoms of feeling tired, fatigued, near-syncope/syncope, shortness of breath, chest pain, or heart failure symptoms.  ALLERGIES: Allergies  Allergen Reactions   Tape     Pulls skin off   Trazodone And Nefazodone Other (See Comments)    Sluggishness the next morning    MEDICATION LIST PRIOR TO VISIT: Current Meds  Medication Sig   acetaminophen (TYLENOL) 500 MG tablet Take 500-1,000 mg by mouth every 8 (eight) hours as needed for moderate pain.   amLODipine (NORVASC) 10 MG tablet Take 1 tablet (10 mg total) by mouth daily.   apixaban (ELIQUIS) 5 MG TABS tablet Take 1 tablet (5 mg total) by mouth 2 (two) times daily.   aspirin EC 81 MG tablet Take 1 tablet (81 mg total) by mouth daily. Swallow whole.   atorvastatin (LIPITOR) 10 MG tablet Take 10 mg by mouth daily.   bumetanide (BUMEX) 1 MG tablet TAKE 1 TABLET(1 MG) BY MOUTH EVERY MORNING   cloNIDine (CATAPRES) 0.3 MG tablet TAKE 1 TABLET(0.3 MG) BY MOUTH THREE TIMES DAILY   dapagliflozin propanediol (FARXIGA) 10 MG TABS tablet Take 1 tablet (10 mg total) by mouth daily.   hydrALAZINE (APRESOLINE) 100 MG tablet Take 1 tablet (100 mg total) by mouth 3 (three) times daily.   isosorbide dinitrate (ISORDIL)  20 MG tablet TAKE 2 TABLETS BY MOUTH THREE TIMES DAILY   metFORMIN (GLUCOPHAGE) 1000 MG tablet Take 1 tablet (1,000 mg total) by mouth 2 (two) times daily with a meal.   metoprolol succinate (TOPROL-XL) 25 MG 24 hr tablet Take 1 tablet (25 mg total) by mouth daily.   pantoprazole (PROTONIX) 40 MG tablet Take 40 mg by mouth daily  before breakfast.   Psyllium (METAMUCIL PO) Take 1 Dose by mouth daily as needed (constipation).   sacubitril-valsartan (ENTRESTO) 97-103 MG Take 1 tablet by mouth 2 (two) times daily.     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   Degeneration of lumbar or lumbosacral intervertebral disc    Deviated nasal septum    Diabetes mellitus without complication (HCC)    Gastroesophageal reflux disease without esophagitis    Generalized anxiety disorder    Gout with tophus    Hearing loss    Hyperlipidemia    Hypertension    Insomnia    Murmur    Aortic stenosis   Nasal turbinate hypertrophy    Nonischemic cardiomyopathy (HCC)    Osteoarthritis of wrist    PAC (premature atrial contraction)    PAF (paroxysmal atrial fibrillation) ([place]) 03/03/2022   Peptic ulcer    Peripheral neuropathy    PVC (premature ventricular contraction)    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    PAST SURGICAL HISTORY: Past Surgical History:  Procedure Laterality Date   CARDIAC CATHETERIZATION     CATARACT EXTRACTION Bilateral 08/2022   COLONOSCOPY     ELBOW SURGERY     pinched nerve on both arms   INGUINAL HERNIA REPAIR Right 10/30/2018   Procedure: OPEN REPAIR OF INCARCERATED RIGHT INGUINAL HERNIA WITH MESH;  Surgeon: WGreer Pickerel MD;  [place] [facility]  Service: General;  Laterality: Right;   IR 3D INDEPENDENT WKST  06/30/2022   IR ANGIO INTRA EXTRACRAN SEL COM CAROTID INNOMINATE UNI R MOD SED  06/30/2022   IR ANGIO INTRA EXTRACRAN SEL INTERNAL CAROTID UNI L MOD SED  06/30/2022   IR ANGIO VERTEBRAL SEL VERTEBRAL UNI L MOD SED  06/30/2022   IR  ANGIOGRAM FOLLOW UP STUDY  07/02/2022   IR CT HEAD LTD  06/30/2022   IR TRANSCATH/EMBOLIZ  06/30/2022   IR [place] VASC ACCESS RIGHT  06/30/2022   IR [place] VASC ACCESS RIGHT  06/30/2022   JOINT REPLACEMENT     LEFT HEART CATH AND CORONARY ANGIOGRAPHY N/A 01/15/2021   Procedure: LEFT HEART CATH AND CORONARY ANGIOGRAPHY;  Surgeon: GAdrian Prows MD;  [facility];  Service: Cardiovascular;  Laterality: N/A;   RADIOLOGY WITH ANESTHESIA N/A 06/30/2022   Procedure: EJake Bathe  Surgeon: dPedro Earls MD;  [place] [facility]  Service: Radiology;  Laterality: N/A;   TEE WITHOUT CARDIOVERSION N/A 12/24/2021   Procedure: TRANSESOPHAGEAL ECHOCARDIOGRAM (TEE);  Surgeon: CSherren Mocha MD;  [place] [facility]  Service: Open Heart Surgery;  Laterality: N/A;   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   Procedure: TOTAL KNEE ARTHROPLASTY;  Surgeon: CSydnee Cabal MD;  Location: [facility];  Service: Orthopedics;  Laterality: Right;  with adductor canal   WRIST SURGERY     pinched nerve    FAMILY HISTORY: The patient family history includes Heart attack in his mother.  SOCIAL HISTORY:  The patient  reports that he quit smoking about 27 years ago. His smoking use included cigarettes. He has a 7.50 pack-year smoking history. He has never used smokeless tobacco. He reports that he does not drink alcohol and does not use drugs.  REVIEW OF SYSTEMS: Review of Systems  Constitutional: Negative for malaise/fatigue.  HENT:         Hard of hearing  Cardiovascular:  Negative for chest pain, dyspnea on exertion, leg swelling, near-syncope, orthopnea, palpitations, paroxysmal nocturnal dyspnea and syncope.  Respiratory:  Negative for shortness of breath.   Neurological:  Negative for dizziness, focal weakness and light-headedness.    PHYSICAL EXAM:    09/08/2022   11:05 AM 09/08/2022   10:53 AM 07/10/2022   12:54 PM  Vitals with BMI  Height  _0    Weight  187 lbs 6 oz    BMI  01.65   Systolic 537 482 707  Diastolic 62 62 74  Pulse 56 58 59   CONSTITUTIONAL: Well-developed and well-nourished. No acute distress.  CHEST Normal respiratory effort. No intercostal retractions  LUNGS: Clear to auscultation bilaterally. No stridor. No wheezes. No rales.  CARDIOVASCULAR: Regular, S1-S2, no murmurs, rubs or gallops appreciated. ABDOMINAL: Soft, nontender, nondistended, positive bowel sounds in all 4 quadrants, no apparent ascites.  EXTREMITIES: no peripheral edema,  warm to touch.  RADIOLOGY:  MRI brain without contrast: 02/19/2022 1. No acute intracranial abnormality. 2. Moderate chronic small vessel ischemic disease. 3. 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  MRA head without contrast: 02/19/2022 1. Negative intracranial MRA for large vessel occlusion or other emergent finding. 2. Mild intracranial atherosclerotic disease without hemodynamically significant or correctable stenosis. 3. 4 mm saccular left PCOM aneurysm. This would likely be amendable to endovascular treatment. Consultation with neuro interventional radiology suggested.  CARDIAC DATABASE: EKG: 10/31/2021: Probable normal sinus rhythm cannot rule out ectopic atrial rhythm, IVCD suggestive of RBBB, diffuse nonspecific T wave abnormality.  No significant change compared to prior ECG.   01/29/2022: Sinus bradycardia, 48 bpm, first-degree AV block, occasional PVCs.    03/03/22: Atrial fibrillation, 52 bpm, diffuse nonspecific T wave abnormality.  Compared to prior ECG A-fib is new.  04/04/2022: Atrial tachycardia with escape junctional rhythm.  09/08/2022: Ectopic atrial rhythm, 55 bpm, first-degree AV block, IVCD, nonspecific ST-T changes.  Echocardiogram: 06/18/2021:  Normal LV systolic function with visual EF 50-55%. Left ventricle cavity is normal in size. Mild left ventricular hypertrophy. Normal global wall motion. Doppler evidence of grade II (pseudonormal) diastolic  dysfunction,  elevated LAP.  Left atrial cavity is mildly dilated.  Trileaflet aortic valve with no regurgitation. Severe aortic valve leaflet calcification. Moderate aortic valve stenosis. Peak velocity 3.73ms, Peak Gradient 39.8 mmHg, Mean Gradient 27 mmHg, AVA 0.8cm, DI 0.3.  Mild (Grade I) mitral regurgitation.  Moderate tricuspid regurgitation. Mild pulmonary hypertension. RVSP measures 49 mmHg.  Insignificant pericardial effusion. There is no hemodynamic significance.  IVC is dilated with a respiratory response of >50%.  Compared to study 12/06/2020: LVEF was 55-60% and now 50-55%, AS remains moderate, RVSP was 352mg and now 4918m.  10/21/2021:  1. Compared to echo report from September 2022, LVEF is depressed and AS is severe.   2. Global longitudinal strain is -13.4%. Left ventricular ejection fraction, by estimation,  is 40%. The left ventricle has moderately decreased function. The left ventricle demonstrates global hypokinesis. The left ventricular internal cavity size was mildly dilated. There is mild left ventricular hypertrophy.   3. Right ventricular systolic function is mildly reduced. The right ventricular size is normal.   4. Left atrial size was moderately dilated.   5. Right atrial size was mildly dilated.   6. Mild mitral valve regurgitation.   7. AV is thickened, calcified with restricted motion motion. Peak and mean gradients through the valve are 48 and 30 mm Hg respectively. AVA (VTI) is 0.99cm2. Dimensionless index is 0.25 All consistent with severe  AS. Marland Kitchen Aortic valve regurgitation is not  visualized.   8. The inferior vena cava is dilated in size with <50% respiratory variability, suggesting right atrial pressure of 15 mmHg.  01/22/2022: LVEF 35-40%, global hypokinesis, mild LVH, grade 2 diastolic dysfunction, elevated LVEDP, RV size and function normal, severely elevated PASP, severely dilated left atrium, moderately dilated right atrium, mild MR, 21 9 mm SAPIEN 3  bioprosthetic aortic valve present, peak velocity 1.95 m/s, mean gradient 8.3 mmHg, AVA per VTI 1.97 cm, estimated RAP 8 mmHg.   Stress Testing: Lexiscan Tetrofosmin Stress Test 12/24/2020: Nondiagnostic ECG stress. The left ventricle is dilated in stress images more than rest images, LV end-diastolic volume 854 mL. There is mild diaphragmatic attenuation artifact in the inferior wall. Superimposed on this there is a moderate-sized moderate decrease in counts in the basal inferior and inferolateral wall suggestive of ischemia. Gated SPECT imaging of the left ventricle was abnormal, demonstrating hypokinesis of the mid inferolateral wall, mid inferior wall and basal inferolateral wall. Stress LV EF is mildly dysfunctional 48%.  No previous exam available for comparison. High risk study.   Left heart catheterization 01/15/2021: LV: Normal LV systolic function.  EDP mildly elevated at 20 mmHg.  There is 22 mmHg peak to peak pressure gradient across the aortic valve.  Findings are consistent with mild aortic stenosis. Very mild coronary calcification and minimal luminal irregularity in the coronary vessels.  Right dominant circulation. Impression: No significant coronary disease angiography.  Coronary calcification is evident.  Normal LVEF and mild aortic stenosis. 50 mL contrast used.  Carotid artery duplex 12/19/2020: Minimal stenosis in the right internal carotid artery (1-15%). Doppler velocity suggests stenosis in the right Minimal stenosis in the left internal carotid artery (1-15%). Doppler velocity suggests stenosis in the left external carotid artery (<50%). Mild heterogeneous plaque noted bilaterally.  Antegrade right vertebral artery flow. Antegrade left vertebral artery flow. External carotid artery stenosis is probably source of bruit. Follow up study is appropriate if clinically indicated.  Renal duplex: 03/23/2022 Renal:  Right: Normal size right kidney. Normal right Resisitive Index.  No evidence of right renal artery stenosis. RRV flow present.         Cyst(s) noted.  Left:  Normal size of left kidney. Abnormal left Resisitve Index. 1-59% stenosis of the left renal artery. LRV flow present.         Cyst(s) noted- largest measuring 7.4 x 6.6 x 6.7 cm.  Mesenteric:  70 to 99% stenosis in the superior mesenteric artery.   LABORATORY DATA: External Labs: Collected: 11/29/2020 Creatinine 0.88 mg/dL. eGFR: 86 mL/min per 1.73 m Lipid profile: Total cholesterol 186, triglycerides 131, HDL 39, LDL 116, non HDL 147 Hemoglobin A1c: 7  External Labs: Collected: 04/03/2022 at [facility]. Hemoglobin 9.7 g/dL, hematocrit 30% Sodium 135, potassium four 5.2, chloride 104, bicarb 24,  eGFR 57   LABORATORY DATA:    Latest Ref Rng & Units 06/30/2022    7:46 AM 05/15/2022    2:32 PM 03/26/2022    3:48 AM  CBC  WBC 4.0 - 10.5 K/uL 3.8   4.4   Hemoglobin 13.0 - 17.0 g/dL 10.1  10.5  9.6   Hematocrit 39.0 - 52.0 % 32.7  31.6  28.6   Platelets 150 - 400 K/uL 92   108        Latest Ref Rng & Units 06/30/2022    7:46 AM 05/23/2022   10:54 AM 05/15/2022    2:32 PM  CMP  Glucose 70 - 99 mg/dL 124  130  117   BUN 8 - 23 mg/dL 45  27  23   Creatinine 0.61 - 1.24 mg/dL 1.49  1.31  1.25   Sodium 135 - 145 mmol/L 137  139  140   Potassium 3.5 - 5.1 mmol/L 4.5  4.0  4.8   Chloride 98 - 111 mmol/L 104  102  106   CO2 22 - 32 mmol/L _0 Calcium 8.9 - 10.3 mg/dL 8.8  8.6  8.9   Total Protein 6.0 - 8.5 g/dL   6.3   Total Bilirubin 0.0 - 1.2 mg/dL   0.5   Alkaline Phos 44 - 121 IU/L   173   AST 0 - 40 IU/L   20   ALT 0 - 44 IU/L   24     Lipid Panel  Lab Results  Component Value Date   CHOL 125 03/23/2022   HDL 51 03/23/2022   LDLCALC 61 03/23/2022   TRIG 120 07/01/2022   CHOLHDL 2.5 03/23/2022     No components found for: "NTPROBNP" Recent Labs    11/12/21 0937 12/30/21 1205 03/03/22 1151 03/14/22 1250 05/15/22 1432  05/23/22 1057  PROBNP 1,918* 2,011* 4,258* 3,403* 6,876* 6,408*   Recent Labs    03/03/22 1151 03/24/22 1419  TSH 0.602 0.532    BMP Recent Labs    03/25/22 0409 03/26/22 0348 05/15/22 1432 05/23/22 1054 06/30/22 0746  NA 133* 134* 140 139 137  K 3.2* 4.0 4.8 4.0 4.5  CL 99 101 106 102 104  CO2 _1 20*  GLUCOSE 121* 102* 117* 130* 124*  BUN 22 30* 23 27 45*  CREATININE 1.34* 1.50* 1.25 1.31* 1.49*  CALCIUM 7.9* 8.0* 8.9 8.6 8.8*  GFRNONAA 53* 46*  --   --  47*    HEMOGLOBIN A1C Lab Results  Component Value Date   HGBA1C 6.2 (H) 02/19/2022   MPG 131.24 02/19/2022    Hepatic Function Panel Recent Labs    03/22/22 1200 03/23/22 0442 05/15/22 1432  PROT 6.6 5.8* 6.3  ALBUMIN 3.9 3.2* 4.0  AST _2 ALT _3 ALKPHOS 82 71 173*  BILITOT 0.9 1.2 0.5     IMPRESSION:    ICD-10-CM   1. Paroxysmal atrial flutter (HCC)  I48.92 EKG 12-Lead    2. S/P TAVR (transcatheter aortic valve replacement)  Z95.2     3. Long term (current) use of anticoagulants  Z79.01     4. Chronic HFrEF (heart failure with reduced ejection fraction) (HCC)  I50.22     5. Nonischemic cardiomyopathy (HCC)  I42.8     6. Resistant hypertension  I1A.0     7. History of stroke  Z86.73     8. Mixed hyperlipidemia  E78.2  9. Type 2 diabetes mellitus with hyperglycemia, without long-term current use of insulin (HCC)  E11.65     10. Former smoker  Z87.891        RECOMMENDATIONS: Cesario Weidinger is a 81 y.o. male whose past medical history and cardiac risk factors include: Hx of aortic stenosis s/p 82m Edward Sapien 3 Valve, heart failure with reduced EF, retinal artery branch occlusion, paroxysmal atrial fibrillation/flutter, saccular left PCOM aneurysm, hypertension, hyperlipidemia, non-insulin-dependent diabetes mellitus type 2, former smoker, advanced age.  Paroxysmal atrial flutter (HCC) Rate control: Metoprolol. Rhythm control: N/A. Thromboembolic prophylaxis:  Eliquis. Does not endorse evidence of bleeding. CHA2DS2-VASc SCORE is 8 which correlates to 10.8% risk of stroke per year (CHF, HTN, age, DM, history of CVA, vascular disease). Samples for Eliquis provided. Patient assistance filled for Eliquis as well.  S/P TAVR (transcatheter aortic valve replacement) S/p  240mEdward Sapien 3 Valve w/ Left subclavian approach in Feb 2023. Patient aware of antibiotic prophylaxis prior to dental procedures.  Chronic HFrEF (heart failure with reduced ejection fraction) (HCC) / Nonischemic cardiomyopathy (HCC) Euvolemic. Medications reconciled. We will continue to monitor his blood pressures via remote patient monitoring and consider discontinuation of amlodipine and a trial of spironolactone with close monitoring of potassium. Strict I's and O's and daily weights. Low-salt diet.  Resistant hypertension Much improved compared to the past. Has refused to undergo sleep study in the past-still feels the same. Remote patient monitoring data reviewed average as noted above. 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  Educated on importance of secondary prevention.  Mixed hyperlipidemia Continue statin therapy. Initial LDL was 165 mg/dL as of April 2023 and now less than 70 mg/dL. Does not endorse myalgias.  Based on ambulatory blood pressure monitoring results.  Patient's ventricular rate is in the bradycardic range.  We discussed down titration of clonidine.  However, patient would like to hold off on this change until the first of the next year.  In the interim he is asked to seek medical attention if he has any symptoms of symptomatic bradycardia.  He verbalizes understanding.   FINAL MEDICATION LIST END OF ENCOUNTER: No orders of the defined types were placed in this encounter.    Current  Outpatient Medications:    acetaminophen (TYLENOL) 500 MG tablet, Take 500-1,000 mg by mouth every 8 (eight) hours as needed for moderate pain., Disp: , Rfl:    amLODipine (NORVASC) 10 MG tablet, Take 1 tablet (10 mg total) by mouth daily., Disp: 30 tablet, Rfl: 2   apixaban (ELIQUIS) 5 MG TABS tablet, Take 1 tablet (5 mg total) by mouth 2 (two) times daily., Disp: 180 tablet, Rfl: 3   aspirin EC 81 MG tablet, Take 1 tablet (81 mg total) by mouth daily. Swallow whole., Disp: 30 tablet, Rfl: 12   atorvastatin (LIPITOR) 10 MG tablet, Take 10 mg by mouth daily., Disp: , Rfl:    bumetanide (BUMEX) 1 MG tablet, TAKE 1 TABLET(1 MG) BY MOUTH EVERY MORNING, Disp: 90 tablet, Rfl: 0   cloNIDine (CATAPRES) 0.3 MG tablet, TAKE 1 TABLET(0.3 MG) BY MOUTH THREE TIMES DAILY, Disp: 270 tablet, Rfl: 0   dapagliflozin propanediol (FARXIGA) 10 MG TABS tablet, Take 1 tablet (10 mg total) by mouth daily., Disp: 90 tablet, Rfl: 3   hydrALAZINE (APRESOLINE) 100 MG tablet, Take 1 tablet (100 mg total) by mouth 3 (  three) times daily., Disp: 270 tablet, Rfl: 2   isosorbide dinitrate (ISORDIL) 20 MG tablet, TAKE 2 TABLETS BY MOUTH THREE TIMES DAILY, Disp: 540 tablet, Rfl: 0   metFORMIN (GLUCOPHAGE) 1000 MG tablet, Take 1 tablet (1,000 mg total) by mouth 2 (two) times daily with a meal., Disp: , Rfl:    metoprolol succinate (TOPROL-XL) 25 MG 24 hr tablet, Take 1 tablet (25 mg total) by mouth daily., Disp: 90 tablet, Rfl: 3   pantoprazole (PROTONIX) 40 MG tablet, Take 40 mg by mouth daily before breakfast., Disp: , Rfl:    Psyllium (METAMUCIL PO), Take 1 Dose by mouth daily as needed (constipation)., Disp: , Rfl:    sacubitril-valsartan (ENTRESTO) 97-103 MG, Take 1 tablet by mouth 2 (two) times daily., Disp: , Rfl:    Multiple Vitamin (MULTI VITAMIN MENS PO), Take 1 tablet by mouth daily., Disp: , Rfl:   Orders Placed This Encounter  Procedures   EKG 12-Lead   --Continue cardiac medications as reconciled in final medication  list. --Return in about 3 months (around 12/09/2022) for Follow up, heart failure management, hfref, Atrial flutter. Or sooner if needed. --Continue follow-up with your primary care physician regarding the management of your other chronic comorbid conditions.  Patient's questions and concerns were addressed to his satisfaction. He voices understanding of the instructions provided during this encounter.   This note was created using a voice recognition software as a result there may be grammatical errors inadvertently enclosed that do not reflect the nature of this encounter. Every attempt is made to correct such errors.  Rex Kras, [place], [facility]  Pager: [phone] Office: [phone]

## 2022-09-12 ENCOUNTER — Other Ambulatory Visit: Payer: Self-pay

## 2022-09-12 DIAGNOSIS — I5021 Acute systolic (congestive) heart failure: Secondary | ICD-10-CM

## 2022-09-16 ENCOUNTER — Other Ambulatory Visit: Payer: Self-pay

## 2022-09-16 DIAGNOSIS — I10 Essential (primary) hypertension: Secondary | ICD-10-CM

## 2022-09-16 MED ORDER — SPIRONOLACTONE 25 MG PO TABS: 12.5000 mg | ORAL_TABLET | Freq: Every day | ORAL | 0 refills | Status: DC

## 2022-09-16 NOTE — Progress Notes (Signed)
Per conversation with Dr. Terri Skains - changing amlodipine '10mg'$  to spironolactone 12.'5mg'$ ; BMP in 1 week. Patient aware of changes.

## 2022-09-26 LAB — BASIC METABOLIC PANEL
BUN/Creatinine Ratio: 27 — ABNORMAL HIGH (ref 10–24)
BUN: 40 mg/dL — ABNORMAL HIGH (ref 8–27)
CO2: 22 mmol/L (ref 20–29)
Calcium: 9.5 mg/dL (ref 8.6–10.2)
Chloride: 103 mmol/L (ref 96–106)
Creatinine, Ser: 1.48 mg/dL — ABNORMAL HIGH (ref 0.76–1.27)
Glucose: 117 mg/dL — ABNORMAL HIGH (ref 70–99)
Potassium: 4.6 mmol/L (ref 3.5–5.2)
Sodium: 140 mmol/L (ref 134–144)
eGFR: 47 mL/min/{1.73_m2} — ABNORMAL LOW (ref >59)

## 2022-09-26 LAB — MAGNESIUM: Magnesium: 2.2 mg/dL (ref 1.6–2.3)

## 2022-10-13 ENCOUNTER — Other Ambulatory Visit: Payer: Self-pay | Admitting: Cardiology

## 2022-10-13 DIAGNOSIS — I10 Essential (primary) hypertension: Secondary | ICD-10-CM

## 2022-10-14 ENCOUNTER — Other Ambulatory Visit: Payer: Self-pay

## 2022-10-14 DIAGNOSIS — I5022 Chronic systolic (congestive) heart failure: Secondary | ICD-10-CM

## 2022-10-14 DIAGNOSIS — I10 Essential (primary) hypertension: Secondary | ICD-10-CM

## 2022-10-14 NOTE — Progress Notes (Signed)
Follow up labs - spironolactone increase to '50mg'$  daily

## 2022-10-15 ENCOUNTER — Other Ambulatory Visit: Payer: Self-pay

## 2022-10-15 DIAGNOSIS — I10 Essential (primary) hypertension: Secondary | ICD-10-CM

## 2022-10-15 MED ORDER — SPIRONOLACTONE 50 MG PO TABS: 50.0000 mg | ORAL_TABLET | Freq: Every day | ORAL | 2 refills | Status: DC

## 2022-10-23 LAB — BASIC METABOLIC PANEL
BUN/Creatinine Ratio: 29 — ABNORMAL HIGH (ref 10–24)
BUN: 52 mg/dL — ABNORMAL HIGH (ref 8–27)
CO2: 22 mmol/L (ref 20–29)
Calcium: 9.3 mg/dL (ref 8.6–10.2)
Chloride: 102 mmol/L (ref 96–106)
Creatinine, Ser: 1.79 mg/dL — ABNORMAL HIGH (ref 0.76–1.27)
Glucose: 195 mg/dL — ABNORMAL HIGH (ref 70–99)
Potassium: 4.6 mmol/L (ref 3.5–5.2)
Sodium: 139 mmol/L (ref 134–144)
eGFR: 37 mL/min/{1.73_m2} — ABNORMAL LOW (ref >59)

## 2022-10-23 LAB — MAGNESIUM: Magnesium: 2.5 mg/dL — ABNORMAL HIGH (ref 1.6–2.3)

## 2022-10-23 LAB — PRO B NATRIURETIC PEPTIDE: NT-Pro BNP: 3122 pg/mL — ABNORMAL HIGH (ref 0–486)

## 2022-11-05 ENCOUNTER — Other Ambulatory Visit: Payer: Self-pay

## 2022-11-05 DIAGNOSIS — I5043 Acute on chronic combined systolic (congestive) and diastolic (congestive) heart failure: Secondary | ICD-10-CM

## 2022-11-05 NOTE — Progress Notes (Signed)
Per discussion with Dr. Terri Skains - repeat labs for this week to ensure patient is tolerating spironolactone increase

## 2022-11-13 LAB — BASIC METABOLIC PANEL
BUN/Creatinine Ratio: 39 — ABNORMAL HIGH (ref 10–24)
BUN: 71 mg/dL — ABNORMAL HIGH (ref 8–27)
CO2: 22 mmol/L (ref 20–29)
Calcium: 9.4 mg/dL (ref 8.6–10.2)
Chloride: 103 mmol/L (ref 96–106)
Creatinine, Ser: 1.83 mg/dL — ABNORMAL HIGH (ref 0.76–1.27)
Glucose: 110 mg/dL — ABNORMAL HIGH (ref 70–99)
Potassium: 4.9 mmol/L (ref 3.5–5.2)
Sodium: 139 mmol/L (ref 134–144)
eGFR: 36 mL/min/{1.73_m2} — ABNORMAL LOW (ref >59)

## 2022-11-13 LAB — PRO B NATRIURETIC PEPTIDE: NT-Pro BNP: 7250 pg/mL — ABNORMAL HIGH (ref 0–486)

## 2022-11-13 LAB — MAGNESIUM: Magnesium: 2.1 mg/dL (ref 1.6–2.3)

## 2022-11-14 NOTE — Progress Notes (Signed)
Spoke with patient and his wife, they acknowledged understanding of the results and said he is taking all his medications as prescribed. They are going to call back Monday to see when they can move the appointment up, they have to discuss it with their friend that brings him to his appointments.

## 2022-11-19 ENCOUNTER — Other Ambulatory Visit: Payer: Self-pay | Admitting: Cardiology

## 2022-11-19 DIAGNOSIS — I5022 Chronic systolic (congestive) heart failure: Secondary | ICD-10-CM

## 2022-11-19 DIAGNOSIS — I10 Essential (primary) hypertension: Secondary | ICD-10-CM

## 2022-11-21 ENCOUNTER — Encounter: Payer: Self-pay | Admitting: Cardiology

## 2022-11-21 ENCOUNTER — Ambulatory Visit: Payer: Medicare Other | Admitting: Cardiology

## 2022-11-21 VITALS — BP 167/57 | HR 56 | Resp 16 | Ht 71.0 in | Wt 185.6 lb

## 2022-11-21 DIAGNOSIS — I5022 Chronic systolic (congestive) heart failure: Secondary | ICD-10-CM

## 2022-11-21 DIAGNOSIS — I4891 Unspecified atrial fibrillation: Secondary | ICD-10-CM

## 2022-11-21 DIAGNOSIS — E782 Mixed hyperlipidemia: Secondary | ICD-10-CM

## 2022-11-21 DIAGNOSIS — I428 Other cardiomyopathies: Secondary | ICD-10-CM

## 2022-11-21 DIAGNOSIS — E1165 Type 2 diabetes mellitus with hyperglycemia: Secondary | ICD-10-CM

## 2022-11-21 DIAGNOSIS — Z87891 Personal history of nicotine dependence: Secondary | ICD-10-CM

## 2022-11-21 DIAGNOSIS — Z7901 Long term (current) use of anticoagulants: Secondary | ICD-10-CM

## 2022-11-21 DIAGNOSIS — I1A0 Resistant hypertension: Secondary | ICD-10-CM

## 2022-11-21 DIAGNOSIS — Z8673 Personal history of transient ischemic attack (TIA), and cerebral infarction without residual deficits: Secondary | ICD-10-CM

## 2022-11-21 DIAGNOSIS — Z952 Presence of prosthetic heart valve: Secondary | ICD-10-CM

## 2022-11-21 DIAGNOSIS — I4719 Other supraventricular tachycardia: Secondary | ICD-10-CM

## 2022-11-21 NOTE — Progress Notes (Signed)
ID:  Nicholas Shelton, DOB 04-14-1941, MRN [id number]  PCP:  Christain Sacramento, MD  Cardiologist:  Elliot Gault, DO, [facility] (established care 12/13/2020) Cardiac Electrophysiologist: Dr. Lars Mage  Date: 11/21/22 Last Office Visit: 09/08/2022  Chief Complaint  Patient presents with   Atrial Flutter   Congestive Heart Failure   Follow-up    3 month    HPI  Nicholas Shelton is a 82 y.o. male whose past medical history and cardiovascular risk factors include: Hx of aortic stenosis s/p 41m Edward Sapien 3 Valve, heart failure with reduced EF, retinal artery branch occlusion, paroxysmal atrial fibrillation/flutter, saccular left PCOM aneurysm (s/p  endovascular aneurysm embolization of 4 mm supraclinoid left ICA aneurysm and 5 mm blister like aneurysm of the ophthalmic segment of the left ICA), hypertension, hyperlipidemia, non-insulin-dependent diabetes mellitus type 2, former smoker, advanced age.  Known history of aortic stenosis which was monitored by routine echocardiography.  Though he remained asymptomatic he was noted to have reduced LVEF and underwent TAVR with 29 mm Edwards SAPIEN 3 valve.  Given his underlying cardiomyopathy his GDMT was uptitrated to maximally tolerated doses.  But unfortunately continues to have uncontrolled hypertension likely due to clonidine withdrawal.  He was uptitrated on his dose of clonidine and his blood pressures have improved significantly.  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  He now presents for 354-monthollow-up visit.  He denies anginal discomfort or heart failure symptoms.  Office blood pressures are not well-controlled.  However, home blood pressures are much better controlled (scanned in  the media section).  Labs from 11/12/2022 independently reviewed.  Renal function relatively at baseline NT-proBNP slightly uptrending but clinically remains asymptomatic.  Patient refused to be on ambulatory blood pressure monitoring and weight management due to it being cost prohibitive  ALLERGIES: Allergies  Allergen Reactions   Tape     Pulls skin off   Trazodone And Nefazodone Other (See Comments)    Sluggishness the next morning    MEDICATION LIST PRIOR TO VISIT: Current Meds  Medication Sig   acetaminophen (TYLENOL) 500 MG tablet Take 500-1,000 mg by mouth every 8 (eight) hours as needed for moderate pain.   apixaban (ELIQUIS) 5 MG TABS tablet Take 1 tablet (5 mg total) by mouth 2 (two) times daily.   aspirin EC 81 MG tablet Take 1 tablet (81 mg total) by mouth daily. Swallow whole.   atorvastatin (LIPITOR) 10 MG tablet Take 10 mg by mouth daily.   bumetanide (BUMEX) 1 MG tablet TAKE 1 TABLET(1 MG) BY MOUTH EVERY MORNING   cloNIDine (CATAPRES) 0.3 MG tablet TAKE 1 TABLET(0.3 MG) BY MOUTH THREE TIMES DAILY   dapagliflozin propanediol (FARXIGA) 10 MG TABS tablet Take 1 tablet (10 mg total) by mouth daily.   hydrALAZINE (APRESOLINE) 100 MG tablet TAKE 1 TABLET(100 MG) BY MOUTH THREE TIMES DAILY   isosorbide dinitrate (ISORDIL) 20 MG tablet TAKE 2 TABLETS BY MOUTH THREE TIMES DAILY   metFORMIN (GLUCOPHAGE) 1000 MG tablet Take 1 tablet (1,000 mg total) by mouth 2 (two) times daily with a meal.   metoprolol succinate (TOPROL-XL) 25 MG 24 hr tablet Take 1 tablet (25 mg total) by mouth daily.   Multiple Vitamin (MULTI VITAMIN MENS PO) Take 1 tablet by mouth daily.   pantoprazole (PROTONIX) 40  MG tablet Take 40 mg by mouth daily before breakfast.   Psyllium (METAMUCIL PO) Take 1 Dose by mouth daily as needed (constipation).   sacubitril-valsartan (ENTRESTO) 97-103 MG Take 1 tablet by mouth 2 (two) times daily.   spironolactone (ALDACTONE) 50 MG tablet Take 1 tablet (50 mg total) by mouth  daily.     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   Degeneration of lumbar or lumbosacral intervertebral disc    Deviated nasal septum    Diabetes mellitus without complication (HCC)    Gastroesophageal reflux disease without esophagitis    Generalized anxiety disorder    Gout with tophus    Hearing loss    Hyperlipidemia    Hypertension    Insomnia    Murmur    Aortic stenosis   Nasal turbinate hypertrophy    Nonischemic cardiomyopathy (HCC)    Osteoarthritis of wrist    PAC (premature atrial contraction)    PAF (paroxysmal atrial fibrillation) ([place]) 03/03/2022   Peptic ulcer    Peripheral neuropathy    PVC (premature ventricular contraction)    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    PAST SURGICAL HISTORY: Past Surgical History:  Procedure Laterality Date   CARDIAC CATHETERIZATION     CATARACT EXTRACTION Bilateral 08/2022   COLONOSCOPY     ELBOW SURGERY     pinched nerve on both arms   INGUINAL HERNIA REPAIR Right 10/30/2018   Procedure: OPEN REPAIR OF INCARCERATED RIGHT INGUINAL HERNIA WITH MESH;  Surgeon: WGreer Pickerel MD;  [facility]  Service: General;  Laterality: Right;   IR 3D INDEPENDENT WKST  06/30/2022   IR ANGIO INTRA EXTRACRAN SEL COM CAROTID INNOMINATE UNI R MOD SED  06/30/2022   IR ANGIO INTRA EXTRACRAN SEL INTERNAL CAROTID UNI L MOD SED  06/30/2022   IR ANGIO VERTEBRAL SEL VERTEBRAL UNI L MOD SED  06/30/2022   IR ANGIOGRAM FOLLOW UP STUDY  07/02/2022   IR CT HEAD LTD  06/30/2022   IR TRANSCATH/EMBOLIZ  06/30/2022   IR [place] VASC ACCESS RIGHT  06/30/2022   IR [place] VASC ACCESS RIGHT   06/30/2022   JOINT REPLACEMENT     LEFT HEART CATH AND CORONARY ANGIOGRAPHY N/A 01/15/2021   Procedure: LEFT HEART CATH AND CORONARY ANGIOGRAPHY;  Surgeon: GAdrian Prows MD;  [facility];  Service: Cardiovascular;  Laterality: N/A;   RADIOLOGY WITH ANESTHESIA N/A 06/30/2022   Procedure: EJake Bathe  Surgeon: dPedro Earls MD;  [facility]  Service: Radiology;  Laterality: N/A;   TEE WITHOUT CARDIOVERSION N/A 12/24/2021   Procedure: TRANSESOPHAGEAL ECHOCARDIOGRAM (TEE);  Surgeon: CSherren Mocha MD;  [facility]  Service: Open Heart Surgery;  Laterality: N/A;   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   Procedure: TOTAL KNEE ARTHROPLASTY;  Surgeon: CSydnee Cabal MD;  Location: [facility];  Service: Orthopedics;  Laterality: Right;  with adductor canal   WRIST SURGERY     pinched nerve    FAMILY HISTORY: The patient family history includes Heart attack in his mother.  SOCIAL HISTORY:  The patient  reports that he quit smoking about 28 years ago. His smoking use included cigarettes. He has a 7.50 pack-year smoking history. He has never used smokeless tobacco. He reports that he does not drink alcohol and does not use drugs.  REVIEW OF SYSTEMS: Review of Systems  Constitutional: Negative for malaise/fatigue.  HENT:         Hard of hearing  Cardiovascular:  Negative for chest pain, dyspnea on exertion, leg swelling, near-syncope, orthopnea, palpitations, paroxysmal nocturnal dyspnea and syncope.  Respiratory:  Negative for shortness of breath.   Neurological:  Negative for dizziness, focal weakness and light-headedness.    PHYSICAL EXAM:    11/21/2022   10:13 AM 09/08/2022   11:05 AM 09/08/2022   10:53 AM  Vitals with BMI  Height '5\' 11"'$   '5\' 11"'$   Weight 185 lbs 10 oz  187 lbs 6 oz  BMI 66.2  94.76  Systolic 546 503 546  Diastolic 57 62 62  Pulse 56 56 58   Physical Exam  Constitutional: No distress.  Age appropriate, hemodynamically  stable.   Neck: No JVD present.  Cardiovascular: Normal rate, regular rhythm, S1 normal, S2 normal, intact distal pulses and normal pulses. Exam reveals no gallop, no S3 and no S4.  No murmur heard. Pulmonary/Chest: Effort normal and breath sounds normal. No stridor. He has no wheezes. He has no rales.  Abdominal: Soft. Bowel sounds are normal. He exhibits no distension. There is no abdominal tenderness.  Musculoskeletal:        General: No edema.     Cervical back: Neck supple.  Neurological: He is alert and oriented to person, place, and time. He has intact cranial nerves (2-12).  Skin: Skin is warm and moist.   RADIOLOGY:  MRI brain without contrast: 02/19/2022 1. No acute intracranial abnormality. 2. Moderate chronic small vessel ischemic disease. 3. 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  MRA head without contrast: 02/19/2022 1. Negative intracranial MRA for large vessel occlusion or other emergent finding. 2. Mild intracranial atherosclerotic disease without hemodynamically significant or correctable stenosis. 3. 4 mm saccular left PCOM aneurysm. This would likely be amendable to endovascular treatment. Consultation with neuro interventional radiology suggested.  CARDIAC DATABASE: EKG: 10/31/2021: Probable normal sinus rhythm cannot rule out ectopic atrial rhythm, IVCD suggestive of RBBB, diffuse nonspecific T wave abnormality.  No significant change compared to prior ECG.   01/29/2022: Sinus bradycardia, 48 bpm, first-degree AV block, occasional PVCs.    03/03/22: Atrial fibrillation, 52 bpm, diffuse nonspecific T wave abnormality.  Compared to prior ECG A-fib is new.  04/04/2022: Atrial tachycardia with escape junctional rhythm.  09/08/2022: Ectopic atrial rhythm, 55 bpm, first-degree AV block, IVCD, nonspecific ST-T changes.  11/21/2022: Atrial tachycardia with complete heart block and junctional escape at 47bpm.  Echocardiogram: 06/18/2021:  Normal LV  systolic function with visual EF 50-55%. Left ventricle cavity is normal in size. Mild left ventricular hypertrophy. Normal global wall motion. Doppler evidence of grade II (pseudonormal) diastolic dysfunction,  elevated LAP.  Left atrial cavity is mildly dilated.  Trileaflet aortic valve with no regurgitation. Severe aortic valve leaflet calcification. Moderate aortic valve stenosis. Peak velocity 3.61ms, Peak Gradient 39.8 mmHg, Mean Gradient 27 mmHg, AVA 0.8cm, DI 0.3.  Mild (Grade I) mitral regurgitation.  Moderate tricuspid regurgitation. Mild pulmonary hypertension. RVSP measures 49 mmHg.  Insignificant pericardial effusion. There is no hemodynamic significance.  IVC is dilated with a respiratory response of >50%.  Compared to study 12/06/2020: LVEF was 55-60% and now 50-55%,  AS remains moderate, RVSP was 20mHg and now 417mg.  10/21/2021:  1. Compared to echo report from September 2022, LVEF is depressed and AS is severe.   2. Global longitudinal strain is -13.4%. Left ventricular ejection fraction, by estimation, is 40%. The left ventricle has moderately decreased function. The left ventricle demonstrates global hypokinesis. The left ventricular internal cavity size was mildly dilated. There is mild left ventricular hypertrophy.   3. Right ventricular systolic function is mildly reduced. The right ventricular size is normal.   4. Left atrial size was moderately dilated.   5. Right atrial size was mildly dilated.   6. Mild mitral valve regurgitation.   7. AV is thickened, calcified with restricted motion motion. Peak and mean gradients through the valve are 48 and 30 mm Hg respectively. AVA (VTI) is 0.99cm2. Dimensionless index is 0.25 All consistent with severe  AS. . Marland Kitchenortic valve regurgitation is not  visualized.   8. The inferior vena cava is dilated in size with <50% respiratory variability, suggesting right atrial pressure of 15 mmHg.  01/22/2022: LVEF 35-40%, global hypokinesis,  mild LVH, grade 2 diastolic dysfunction, elevated LVEDP, RV size and function normal, severely elevated PASP, severely dilated left atrium, moderately dilated right atrium, mild MR, 21 9 mm SAPIEN 3 bioprosthetic aortic valve present, peak velocity 1.95 m/s, mean gradient 8.3 mmHg, AVA per VTI 1.97 cm, estimated RAP 8 mmHg.   Stress Testing: Lexiscan Tetrofosmin Stress Test 12/24/2020: Nondiagnostic ECG stress. The left ventricle is dilated in stress images more than rest images, LV end-diastolic volume 17798L. There is mild diaphragmatic attenuation artifact in the inferior wall. Superimposed on this there is a moderate-sized moderate decrease in counts in the basal inferior and inferolateral wall suggestive of ischemia. Gated SPECT imaging of the left ventricle was abnormal, demonstrating hypokinesis of the mid inferolateral wall, mid inferior wall and basal inferolateral wall. Stress LV EF is mildly dysfunctional 48%.  No previous exam available for comparison. High risk study.   Left heart catheterization 01/15/2021: LV: Normal LV systolic function.  EDP mildly elevated at 20 mmHg.  There is 22 mmHg peak to peak pressure gradient across the aortic valve.  Findings are consistent with mild aortic stenosis. Very mild coronary calcification and minimal luminal irregularity in the coronary vessels.  Right dominant circulation. Impression: No significant coronary disease angiography.  Coronary calcification is evident.  Normal LVEF and mild aortic stenosis. 50 mL contrast used.  Carotid artery duplex 12/19/2020: Minimal stenosis in the right internal carotid artery (1-15%).  Minimal stenosis in the left internal carotid artery (1-15%).  Mild heterogeneous plaque noted bilaterally.  Antegrade right vertebral artery flow. Antegrade left vertebral artery flow. External carotid artery stenosis is probably source of bruit. Follow up study is appropriate if clinically indicated.  Renal  duplex: 03/23/2022 Renal:  Right: Normal size right kidney. Normal right Resisitive Index. No evidence of right renal artery stenosis. RRV flow present.         Cyst(s) noted.  Left:  Normal size of left kidney. Abnormal left Resisitve Index. 1-59% stenosis of the left renal artery. LRV flow present.         Cyst(s) noted- largest measuring 7.4 x 6.6 x 6.7 cm.  Mesenteric:  70 to 99% stenosis in the superior mesenteric artery.   LABORATORY DATA: External Labs: Collected: 11/29/2020 Creatinine 0.88 mg/dL. eGFR: 86 mL/min per 1.73 m Lipid profile: Total cholesterol 186, triglycerides 131, HDL 39, LDL 116, non HDL 147 Hemoglobin A1c: 7  External  Labs: Collected: 04/03/2022 at [facility]. Hemoglobin 9.7 g/dL, hematocrit 30% Sodium 135, potassium four 5.2, chloride 104, bicarb 24, eGFR 57   LABORATORY DATA:    Latest Ref Rng & Units 06/30/2022    7:46 AM 05/15/2022    2:32 PM 03/26/2022    3:48 AM  CBC  WBC 4.0 - 10.5 K/uL 3.8   4.4   Hemoglobin 13.0 - 17.0 g/dL 10.1  10.5  9.6   Hematocrit 39.0 - 52.0 % 32.7  31.6  28.6   Platelets 150 - 400 K/uL 92   108        Latest Ref Rng & Units 11/12/2022    4:47 PM 10/22/2022   10:42 AM 09/25/2022   10:39 AM  CMP  Glucose 70 - 99 mg/dL 110  195  117   BUN 8 - 27 mg/dL 71  52  40   Creatinine 0.76 - 1.27 mg/dL 1.83  1.79  1.48   Sodium 134 - 144 mmol/L 139  139  140   Potassium 3.5 - 5.2 mmol/L 4.9  4.6  4.6   Chloride 96 - 106 mmol/L 103  102  103   CO2 20 - 29 mmol/L '22  22  22   '$ Calcium 8.6 - 10.2 mg/dL 9.4  9.3  9.5     Lipid Panel  Lab Results  Component Value Date   CHOL 125 03/23/2022   HDL 51 03/23/2022   LDLCALC 61 03/23/2022   TRIG 120 07/01/2022   CHOLHDL 2.5 03/23/2022     No components found for: "NTPROBNP" Recent Labs    12/30/21 1205 03/03/22 1151 03/14/22 1250 05/15/22 1432 05/23/22 1057 10/22/22 1040 11/12/22 1646  PROBNP 2,011* 4,258* 3,403* 6,876* 6,408*  3,122* 7,250*   Recent Labs    03/03/22 1151 03/24/22 1419  TSH 0.602 0.532    BMP Recent Labs    03/25/22 0409 03/26/22 0348 05/15/22 1432 06/30/22 0746 09/25/22 1039 10/22/22 1042 11/12/22 1647  NA 133* 134*   < > 137 140 139 139  K 3.2* 4.0   < > 4.5 4.6 4.6 4.9  CL 99 101   < > 104 103 102 103  CO2 26 25   < > 20* '22 22 22  '$ GLUCOSE 121* 102*   < > 124* 117* 195* 110*  BUN 22 30*   < > 45* 40* 52* 71*  CREATININE 1.34* 1.50*   < > 1.49* 1.48* 1.79* 1.83*  CALCIUM 7.9* 8.0*   < > 8.8* 9.5 9.3 9.4  GFRNONAA 53* 46*  --  47*  --   --   --    < > = values in this interval not displayed.    HEMOGLOBIN A1C Lab Results  Component Value Date   HGBA1C 6.2 (H) 02/19/2022   MPG 131.24 02/19/2022    Hepatic Function Panel Recent Labs    03/22/22 1200 03/23/22 0442 05/15/22 1432  PROT 6.6 5.8* 6.3  ALBUMIN 3.9 3.2* 4.0  AST '21 18 20  '$ ALT '30 22 24  '$ ALKPHOS 82 71 173*  BILITOT 0.9 1.2 0.5     IMPRESSION:    ICD-10-CM   1. Atrial fibrillation and flutter, paroxysmal  I48.91 EKG 12-Lead   I48.92     2. Long term (current) use of anticoagulants  Z79.01     3. Atrial tachycardia  I47.19     4. Nonischemic cardiomyopathy (HCC)  I42.8     5. Chronic HFrEF (heart  failure with reduced ejection fraction) (HCC)  I50.22 EKG 12-Lead    PCV ECHOCARDIOGRAM COMPLETE    6. S/P TAVR (transcatheter aortic valve replacement)  Z95.2     7. Resistant hypertension  I1A.0     8. History of stroke  Z86.73     9. Mixed hyperlipidemia  E78.2     10. Type 2 diabetes mellitus with hyperglycemia, without long-term current use of insulin (HCC)  E11.65     11. Former smoker  Z87.891        RECOMMENDATIONS: Nicholas Shelton is a 82 y.o. male whose past medical history and cardiac risk factors include: Hx of aortic stenosis s/p 28m Edward Sapien 3 Valve, heart failure with reduced EF, retinal artery branch occlusion, paroxysmal atrial fibrillation/flutter, saccular left PCOM  aneurysm, hypertension, hyperlipidemia, non-insulin-dependent diabetes mellitus type 2, former smoker, advanced age.  Paroxysmal atrial flutter/fibrillation (HCC) Rate control: Metoprolol. Rhythm control: N/A. Thromboembolic prophylaxis: Eliquis. Does not endorse evidence of bleeding. CHA2DS2-VASc SCORE is 8 which correlates to 10.8% risk of stroke per year (CHF, HTN, age, DM, history of CVA, vascular disease). Patient does not endorse evidence of bleeding. Reemphasized the risks, benefits, alternatives to anticoagulation.  S/P TAVR (transcatheter aortic valve replacement) S/p  252mEdward Sapien 3 Valve w/ Left subclavian approach in Feb 2023. Patient aware of antibiotic prophylaxis prior to dental procedures.  Chronic HFrEF (heart failure with reduced ejection fraction) (HCC) Nonischemic cardiomyopathy (HCC) NYHA class II Euvolemic on physical examination. NT proBNP trending up -monitor for now (likely disease progression) Recommended ambulatory monitoring of blood pressure and weight-patient to consider and to call [place] Echo will be ordered to evaluate for structural heart disease and left ventricular systolic function. Strict I's and O's and daily weights. Low-salt diet.  Resistant hypertension Office blood pressures are now within acceptable range. Home blood pressures are better controlled-see media section. Medications reconciled. Labs from 11/12/2022 independently reviewed. Reemphasized importance of a low-salt diet. 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  Educated on importance of secondary prevention.  Mixed hyperlipidemia Continue statin therapy. Initial LDL was 165 mg/dL as of April 2023 and now less than 70 mg/dL. Does not endorse myalgias.  Patient clearly has underlying conduction disease.  However he is  currently asymptomatic and this shared decision was to hold off on additional testing at this time.  He has already established care with cardiac electrophysiology in the past for same reasons.  I have asked him to seek medical attention if he experiences near syncope or syncopal events.  He is also encouraged not to work with heavy equipment to avoid accidental injuries given his underlying conduction disease and being on anticoagulation.  Patient verbalizes understanding.  FINAL MEDICATION LIST END OF ENCOUNTER: No orders of the defined types were placed in this encounter.    Current Outpatient Medications:    acetaminophen (TYLENOL) 500 MG tablet, Take 500-1,000 mg by mouth every 8 (eight) hours as needed for moderate pain., Disp: , Rfl:    apixaban (ELIQUIS) 5 MG TABS tablet, Take 1 tablet (5 mg total) by mouth 2 (two) times daily., Disp: 180 tablet, Rfl: 3   aspirin EC 81 MG tablet, Take 1 tablet (81 mg total) by mouth daily. Swallow whole., Disp: 30 tablet, Rfl: 12   atorvastatin (LIPITOR) 10 MG tablet, Take 10 mg by mouth daily., Disp: , Rfl:  bumetanide (BUMEX) 1 MG tablet, TAKE 1 TABLET(1 MG) BY MOUTH EVERY MORNING, Disp: 90 tablet, Rfl: 0   cloNIDine (CATAPRES) 0.3 MG tablet, TAKE 1 TABLET(0.3 MG) BY MOUTH THREE TIMES DAILY, Disp: 270 tablet, Rfl: 0   dapagliflozin propanediol (FARXIGA) 10 MG TABS tablet, Take 1 tablet (10 mg total) by mouth daily., Disp: 90 tablet, Rfl: 3   hydrALAZINE (APRESOLINE) 100 MG tablet, TAKE 1 TABLET(100 MG) BY MOUTH THREE TIMES DAILY, Disp: 270 tablet, Rfl: 2   isosorbide dinitrate (ISORDIL) 20 MG tablet, TAKE 2 TABLETS BY MOUTH THREE TIMES DAILY, Disp: 540 tablet, Rfl: 0   metFORMIN (GLUCOPHAGE) 1000 MG tablet, Take 1 tablet (1,000 mg total) by mouth 2 (two) times daily with a meal., Disp: , Rfl:    metoprolol succinate (TOPROL-XL) 25 MG 24 hr tablet, Take 1 tablet (25 mg total) by mouth daily., Disp: 90 tablet, Rfl: 3   Multiple Vitamin (MULTI VITAMIN  MENS PO), Take 1 tablet by mouth daily., Disp: , Rfl:    pantoprazole (PROTONIX) 40 MG tablet, Take 40 mg by mouth daily before breakfast., Disp: , Rfl:    Psyllium (METAMUCIL PO), Take 1 Dose by mouth daily as needed (constipation)., Disp: , Rfl:    sacubitril-valsartan (ENTRESTO) 97-103 MG, Take 1 tablet by mouth 2 (two) times daily., Disp: , Rfl:    spironolactone (ALDACTONE) 50 MG tablet, Take 1 tablet (50 mg total) by mouth daily., Disp: 30 tablet, Rfl: 2  Orders Placed This Encounter  Procedures   EKG 12-Lead   PCV ECHOCARDIOGRAM COMPLETE   --Continue cardiac medications as reconciled in final medication list. --Return in about 6 months (around 05/25/2023) for Follow up, heart failure management.. Or sooner if needed. --Continue follow-up with your primary care physician regarding the management of your other chronic comorbid conditions.  Patient's questions and concerns were addressed to his satisfaction. He voices understanding of the instructions provided during this encounter.   This note was created using a voice recognition software as a result there may be grammatical errors inadvertently enclosed that do not reflect the nature of this encounter. Every attempt is made to correct such errors.  Rex Kras, [place], [facility]  Pager: [phone] Office: [phone]

## 2022-11-25 ENCOUNTER — Other Ambulatory Visit (HOSPITAL_COMMUNITY): Payer: Self-pay | Admitting: Neuroradiology

## 2022-11-25 ENCOUNTER — Telehealth (HOSPITAL_COMMUNITY): Payer: Self-pay

## 2022-11-25 DIAGNOSIS — I671 Cerebral aneurysm, nonruptured: Secondary | ICD-10-CM

## 2022-11-25 NOTE — Telephone Encounter (Signed)
Called to schedule mra, no answer, left vm. AW  ?

## 2022-12-02 NOTE — Progress Notes (Addendum)
HEART AND [place]                                     Cardiology Office Note:    Date:  12/04/2022   ID:  Nicholas Shelton, DOB [birth year], MRN [id number]  PCP:  Christain Sacramento, MD  [facility] Cardiologist: Dr. Terri Skains, MD / Dr. Burt Knack, MD & Dr. Cyndia Bent, MD (TAVR)  [facility] Electrophysiologist:  Vickie Epley, MD   Referring MD: Christain Sacramento, MD   1 year s/p TAVR  History of Present Illness:    Nicholas Shelton is a 82 y.o. 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    [name] does not recall being told of a heart murmur until last year and was referred to Dr. Brennan Bailey team for monitoring of aortic stenosis. Echo in 10/2021 showed LVEF at 40% with global hypokinesis and LFLG severe AS with a mean transvalvular gradients of 30 mmHg, DVI 0.25 and AVA 0.99 cm. Cardiac catheterization performed previously in 01/2021 showed findings of widely patent coronary arteries with no significant obstruction.   He was evaluated by the multidisciplinary valve team and underwent successful TAVR with 45m S3UR on 12/24/21 via the subclavian approach on 12/24/21. Post operative echo showed EF 40-45%, normally functioning TAVR with a mean gradient of 7.4 mmHg and no PVL as well as a small circumferential pericardial effusion. During his admission he had issues with marked hypertension. He was treated with IV hydralazine, IV NTG and IV labetalol. He was discharged on aspirin monotherapy as well as Norvasc. 1 month echo showed EF 35-40%, normally functioning TAVR with a mean gradient of 9 mm hg and no PVL.   Today the patient presents to clinic for follow up. Here with his wife. He has no complaints and doing quite well. No CP  or SOB. No LE edema, orthopnea or PND. No dizziness or syncope. No blood in stool or urine. No palpitations. He is hard of hearing.    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   Degeneration of lumbar or lumbosacral intervertebral disc    Deviated nasal septum    Diabetes mellitus without complication (HCC)    Gastroesophageal reflux disease without esophagitis    Generalized anxiety disorder    Gout with tophus    Hearing loss    Hyperlipidemia    Hypertension    Insomnia    Murmur    Aortic stenosis   Nasal turbinate hypertrophy    Nonischemic cardiomyopathy (HCC)    Osteoarthritis of wrist    PAC (premature atrial contraction)    [place]ox[place]mal [place] fibrillation) [place] 03/03/2022   Peptic ulcer    Peripheral neuropathy    PVC (premature ventricular contraction)    S/P TAVR (transcatheter aortic valve replacement) 12/24/2021   with 267mS3AUR via [place] approach with Dr.Cooper and Dr. 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    Past  Surgical History:  Procedure Laterality Date   CARDIAC CATHETERIZATION     CATARACT EXTRACTION Bilateral 08/2022   COLONOSCOPY     ELBOW SURGERY     pinched nerve on both arms   INGUINAL HERNIA REPAIR Right 10/30/2018   Procedure: OPEN REPAIR OF INCARCERATED RIGHT INGUINAL HERNIA WITH MESH;  Surgeon: Greer Pickerel, MD;  Location: [place];  Service: General;  Laterality: Right;   IR 3D INDEPENDENT WKST  06/30/2022   IR ANGIO INTRA EXTRACRAN SEL COM CAROTID INNOMINATE UNI R MOD SED  06/30/2022   IR ANGIO INTRA EXTRACRAN SEL INTERNAL CAROTID UNI L MOD SED  06/30/2022   IR ANGIO VERTEBRAL SEL VERTEBRAL UNI L MOD SED  06/30/2022   IR ANGIOGRAM FOLLOW UP STUDY  07/02/2022   IR CT HEAD LTD  06/30/2022   IR TRANSCATH/EMBOLIZ  06/30/2022   IR  US GUIDE VASC ACCESS RIGHT  06/30/2022   IR US GUIDE VASC ACCESS RIGHT  06/30/2022   JOINT REPLACEMENT     LEFT HEART CATH AND CORONARY ANGIOGRAPHY N/A 01/15/2021   Procedure: LEFT HEART CATH AND CORONARY ANGIOGRAPHY;  Surgeon: Adrian Prows, MD;  Location: [place] [facility];  Service: Cardiovascular;  Laterality: N/A;   RADIOLOGY WITH ANESTHESIA N/A 06/30/2022   Procedure: Jake Bathe;  Surgeon: [name], MD;  Location: [place];  Service: Radiology;  Laterality: N/A;   TEE WITHOUT CARDIOVERSION N/A 12/24/2021   Procedure: TRANSESOPHAGEAL ECHOCARDIOGRAM (TEE);  Surgeon: Sherren Mocha, MD;  Location: [place];  Service: Open Heart Surgery;  Laterality: N/A;   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   Procedure: TOTAL KNEE ARTHROPLASTY;  Surgeon: Sydnee Cabal, MD;  Location: [facility];  Service: Orthopedics;  Laterality: Right;  with adductor canal   WRIST SURGERY     pinched nerve    Current Medications: Current Meds  Medication Sig   acetaminophen (TYLENOL) 500 MG tablet Take 500-1,000 mg by mouth every 8 (eight) hours as needed for moderate pain.   apixaban (ELIQUIS) 5 MG TABS tablet Take 1 tablet (5 mg total) by mouth 2 (two) times daily.   atorvastatin (LIPITOR) 10 MG tablet Take 10 mg by mouth daily.   bumetanide (BUMEX) 1 MG tablet TAKE 1 TABLET(1 MG) BY MOUTH EVERY MORNING   cloNIDine (CATAPRES) 0.3 MG tablet TAKE 1 TABLET(0.3 MG) BY MOUTH THREE TIMES DAILY   dapagliflozin propanediol (FARXIGA) 10 MG TABS tablet Take 1 tablet (10 mg total) by mouth daily.   hydrALAZINE (APRESOLINE) 100 MG tablet TAKE 1 TABLET(100 MG) BY MOUTH THREE TIMES DAILY   isosorbide dinitrate (ISORDIL) 20 MG tablet TAKE 2 TABLETS BY MOUTH THREE TIMES DAILY   metFORMIN (GLUCOPHAGE) 1000 MG tablet Take 1 tablet (1,000 mg total) by mouth 2 (two) times daily with a meal.   metoprolol succinate (TOPROL-XL) 25 MG 24 hr tablet Take 1 tablet (25 mg total) by mouth daily.   Multiple Vitamin (MULTI  VITAMIN MENS PO) Take 1 tablet by mouth daily.   pantoprazole (PROTONIX) 40 MG tablet Take 40 mg by mouth daily before breakfast.   Psyllium (METAMUCIL PO) Take 1 Dose by mouth daily as needed (constipation).   sacubitril-valsartan (ENTRESTO) 97-103 MG Take 1 tablet by mouth 2 (two) times daily.   spironolactone (ALDACTONE) 50 MG tablet Take 1 tablet (50 mg total) by mouth daily.   [DISCONTINUED] aspirin EC 81 MG tablet Take 1 tablet (81 mg total) by mouth daily. Swallow whole.     Allergies:   Tape and Trazodone and nefazodone  Social History   Socioeconomic History   Marital status: Married    Spouse name: Not on file   Number of children: 3   Years of education: Not on file   Highest education level: Not on file  Occupational History   Not on file  Tobacco Use   Smoking status: Former    Packs/day: 0.50    Years: 15.00    Total pack years: 7.50    Types: Cigarettes    Quit date: 89    Years since quitting: 28.0   Smokeless tobacco: Never  Vaping Use   Vaping Use: Never used  Substance and Sexual Activity   Alcohol use: No   Drug use: No   Sexual activity: Not on file    Comment: married  Other Topics Concern   Not on file  Social History Narrative   Not on file   Social Determinants of Health   Financial Resource Strain: Not on file  Food Insecurity: Not on file  Transportation Needs: Not on file  Physical Activity: Not on file  Stress: Not on file  Social Connections: Not on file     Family History: The patient's family history includes Heart attack in his mother.  ROS:   Please see the history of present illness.    All other systems reviewed and are negative.  EKGs/Labs/Other Studies Reviewed:    The following studies were reviewed today:  TAVR OPERATIVE NOTE     Date of Procedure:                12/24/2021   Preoperative Diagnosis:      Severe Aortic Stenosis    Postoperative Diagnosis:    Same    Procedure:        Transcatheter Aortic  Valve Replacement - Left Subclavian Artery Approach             [place] Sapien 3  THV (size 29 mm, model # 9600TFX, serial # [id number])              Co-Surgeons:                        Gaye Pollack, MD and Sherren Mocha, MD       Anesthesiologist:                  Albertha Ghee, MD   Echocardiographer:              Marry Guan, MD   Pre-operative Echo Findings: Severe aortic stenosis Normal left ventricular systolic function   Post-operative Echo Findings: trace paravalvular leak Normal left ventricular systolic function    ____________________  Echocardiogram 12/25/21:  1. 29 mm S3. Vmax 1.8 m/s, MG 7.4 mmHG, EOA 2.70 cm2, DI 0.51. No  regurgitation or paravalvular leak. The aortic valve has been  repaired/replaced. Aortic valve regurgitation is not visualized. There is  a 29 mm Sapien prosthetic (TAVR) valve present in  the aortic position. Procedure Date: 12/24/2021. Echo findings are  consistent with normal structure and function of the aortic valve  prosthesis.   2. Left ventricular ejection fraction, by estimation, is 40 to 45%. The  left ventricle has mildly decreased function. The left ventricle  demonstrates global hypokinesis. The left ventricular internal cavity size  was mildly dilated. Left ventricular  diastolic parameters are consistent with Grade III diastolic dysfunction  (restrictive).   3. Right ventricular systolic function is normal. The right ventricular  size is  mildly enlarged. There is mildly elevated pulmonary artery  systolic pressure. The estimated right ventricular systolic pressure is  0000000 mmHg.   4. Left atrial size was mildly dilated.   5. A small pericardial effusion is present. The pericardial effusion is  circumferential. There is no evidence of cardiac tamponade.   6. The mitral valve is degenerative. Trivial mitral valve regurgitation.  No evidence of mitral stenosis.   7. The inferior vena cava is dilated in size with >50% respiratory   variability, suggesting right atrial pressure of 8 mmHg.   ____________________________   Echo 01/22/22 IMPRESSIONS   1. Left ventricular ejection fraction, by estimation, is 35 to 40%. The  left ventricle has moderately decreased function. The left ventricle  demonstrates global hypokinesis. There is mild left ventricular  hypertrophy of the septal segment. Left  ventricular diastolic parameters are consistent with Grade II diastolic  dysfunction (pseudonormalization). Elevated left ventricular end-diastolic  pressure.   2. Right ventricular systolic function is normal. The right ventricular  size is normal. There is severely elevated pulmonary artery systolic  pressure.   3. Left atrial size was severely dilated.   4. Right atrial size was moderately dilated.   5. The mitral valve is degenerative. Mild mitral valve regurgitation. No  evidence of mitral stenosis.   6. The aortic valve has been repaired/replaced. Aortic valve  regurgitation is not visualized. No aortic stenosis is present. There is a  29 mm Sapien prosthetic (TAVR) valve present in the aortic position.  Procedure Date: 12/24/21. Echo findings are  consistent with normal structure and function of the aortic valve  prosthesis. Aortic valve area, by VTI measures 1.97 cm. Aortic valve mean  gradient measures 8.3 mmHg. Aortic valve Vmax measures 1.95 m/s.   7. The inferior vena cava is dilated in size with >50% respiratory  variability, suggesting right atrial pressure of 8 mmHg.   Comparison(s): 12/25/21 EF 40-45%. AV 75mHg mean PG, 173mg peak PG.   EKG:  EKG is NOT ordered today.   Recent Labs: 03/24/2022: TSH 0.532 03/26/2022: B Natriuretic Peptide 1,006.4 05/15/2022: ALT 24 06/30/2022: Hemoglobin 10.1; Platelets 92 11/12/2022: BUN 71; Creatinine, Ser 1.83; Magnesium 2.1; NT-Pro BNP 7,250; Potassium 4.9; Sodium 139  Recent Lipid Panel    Component Value Date/Time   CHOL 125 03/23/2022 0443   CHOL 183 01/04/2021  0926   TRIG 120 07/01/2022 1200   HDL 51 03/23/2022 0443   HDL 41 01/04/2021 0926   CHOLHDL 2.5 03/23/2022 0443   VLDL 13 03/23/2022 0443   LDLCALC 61 03/23/2022 0443   LDLCALC 117 (H) 01/04/2021 0926   Physical Exam:    VS:  BP (!) 178/65   Pulse (!) 51   Ht '5\' 11"'$  (1.803 m)   Wt 185 lb (83.9 kg)   SpO2 99%   BMI 25.80 kg/m     Wt Readings from Last 3 Encounters:  12/03/22 185 lb (83.9 kg)  11/21/22 185 lb 9.6 oz (84.2 kg)  09/08/22 187 lb 6.4 oz (85 kg)    General: Well developed, well nourished, NAD Neck: Negative for carotid bruits. No JVD Lungs:Clear to ausculation bilaterally. No wheezes, rales, or rhonchi. Breathing is unlabored. Cardiovascular: RRR with S1 S2. Soft murmur Extremities: No edema.  Neuro: Alert and oriented. No focal deficits. No facial asymmetry. MAE spontaneously. Psych: Responds to questions appropriately with normal affect.    ASSESSMENT/PLAN:    Severe AS s/p TAVR: echo was scheduled at our office for today but cancelled and moved  to 3/1 for unclear reasons. I have moved it up to 2/19 and will follow results. He has NYHA class I symptoms. He was treated with a baby aspirin after TAVR but now on Eliquis for PAF. Will stop aspirin now.  No need for SBE, has full dentures. Continue regular follow up with Dr. Terri Skains.   ADDENDUM 01/15/23: it was brought to my attention that he had cerebral stenting in 06/2022 and will need to remain on aspirin therapy. This was discussed with the pt by IR MD Dr. Sindy Messing.   HTN: Bp elevated today. He has a long history of uncontrolled HTN as well as white coat HTN. This is followed closely by Dr. Terri Skains. He has refused ambulatory BP monitoring in the past. No changes made today.   PAF: sounds regular on exam. Continue Eliquis.   HFrEF: appears euvolemic today. No changes made   Medication Adjustments/Labs and Tests Ordered: Current medicines are reviewed at length with the patient today.  Concerns regarding  medicines are outlined above.  No orders of the defined types were placed in this encounter.  No orders of the defined types were placed in this encounter.   Patient Instructions  Medication Instructions:  Your physician recommends that you continue on your current medications as directed. Please refer to the Current Medication list given to you today.  *If you need a refill on your cardiac medications before your next appointment, please call your pharmacy*   Lab Work: None ordered   If you have labs (blood work) drawn today and your tests are completely normal, you will receive your results only by: [place] (if you have MyChart) OR A paper copy in the mail If you have any lab test that is abnormal or we need to change your treatment, we will call you to review the results.   Testing/Procedures: None ordered   Follow-Up: Follow up as scheduled   Other Instructions     Signed, Angelena Form, PA-C  12/04/2022 9:04 AM    [place]

## 2022-12-03 ENCOUNTER — Ambulatory Visit: Payer: Medicare Other | Attending: Physician Assistant | Admitting: Physician Assistant

## 2022-12-03 ENCOUNTER — Other Ambulatory Visit (HOSPITAL_COMMUNITY): Payer: Medicare Other

## 2022-12-03 VITALS — BP 178/65 | HR 51 | Ht 71.0 in | Wt 185.0 lb

## 2022-12-03 DIAGNOSIS — I1A0 Resistant hypertension: Secondary | ICD-10-CM | POA: Diagnosis not present

## 2022-12-03 DIAGNOSIS — Z952 Presence of prosthetic heart valve: Secondary | ICD-10-CM

## 2022-12-03 NOTE — Patient Instructions (Signed)
Medication Instructions:  Your physician recommends that you continue on your current medications as directed. Please refer to the Current Medication list given to you today.  *If you need a refill on your cardiac medications before your next appointment, please call your pharmacy*   Lab Work: None ordered   If you have labs (blood work) drawn today and your tests are completely normal, you will receive your results only by: [place] (if you have MyChart) OR A paper copy in the mail If you have any lab test that is abnormal or we need to change your treatment, we will call you to review the results.   Testing/Procedures: None ordered   Follow-Up: Follow up as scheduled   Other Instructions

## 2022-12-04 ENCOUNTER — Telehealth: Payer: Self-pay

## 2022-12-04 NOTE — Telephone Encounter (Signed)
faxed patient assistance for Eliquis 5 mg  on 12/04/22. LM

## 2022-12-04 NOTE — Addendum Note (Signed)
Addended by: Eileen Stanford on: 12/04/2022 09:06 AM   Modules accepted: Level of Service

## 2022-12-05 ENCOUNTER — Ambulatory Visit: Payer: Medicare Other | Admitting: Cardiology

## 2022-12-17 ENCOUNTER — Ambulatory Visit (HOSPITAL_COMMUNITY): Payer: Medicare Other

## 2022-12-17 ENCOUNTER — Encounter (HOSPITAL_COMMUNITY): Payer: Self-pay

## 2022-12-18 ENCOUNTER — Other Ambulatory Visit: Payer: Self-pay

## 2022-12-18 DIAGNOSIS — Z7901 Long term (current) use of anticoagulants: Secondary | ICD-10-CM

## 2022-12-18 DIAGNOSIS — I4891 Unspecified atrial fibrillation: Secondary | ICD-10-CM

## 2022-12-18 MED ORDER — APIXABAN 5 MG PO TABS: 5.0000 mg | ORAL_TABLET | Freq: Two times a day (BID) | ORAL | 3 refills | Status: DC

## 2022-12-26 ENCOUNTER — Ambulatory Visit (HOSPITAL_COMMUNITY): Payer: Medicare Other

## 2022-12-26 ENCOUNTER — Encounter (HOSPITAL_COMMUNITY): Payer: Self-pay

## 2022-12-29 ENCOUNTER — Ambulatory Visit (HOSPITAL_COMMUNITY): Payer: Medicare Other | Attending: Internal Medicine

## 2022-12-29 DIAGNOSIS — Z952 Presence of prosthetic heart valve: Secondary | ICD-10-CM | POA: Insufficient documentation

## 2022-12-29 LAB — ECHOCARDIOGRAM COMPLETE
AV Mean grad: 11.3 mmHg
AV Peak grad: 20 mmHg
Ao pk vel: 2.24 m/s
Area-P 1/2: 3.21 cm2
S' Lateral: 4.1 cm

## 2023-01-09 ENCOUNTER — Other Ambulatory Visit (HOSPITAL_COMMUNITY): Payer: Medicare Other

## 2023-01-12 ENCOUNTER — Other Ambulatory Visit: Payer: Self-pay | Admitting: Cardiology

## 2023-01-12 DIAGNOSIS — I10 Essential (primary) hypertension: Secondary | ICD-10-CM

## 2023-01-14 ENCOUNTER — Ambulatory Visit (HOSPITAL_COMMUNITY)
Admission: RE | Admit: 2023-01-14 | Discharge: 2023-01-14 | Disposition: A | Discharge status: Home / Self Care | Payer: Medicare Other | Source: Ambulatory Visit | Attending: Neuroradiology | Admitting: Neuroradiology

## 2023-01-14 DIAGNOSIS — I671 Cerebral aneurysm, nonruptured: Secondary | ICD-10-CM | POA: Diagnosis not present

## 2023-01-21 ENCOUNTER — Telehealth (HOSPITAL_COMMUNITY): Payer: Self-pay

## 2023-01-21 NOTE — Telephone Encounter (Signed)
Spoke to pt's sister-n-law. Agreed to have pt continue a baby aspirin and f/u in 1 year with an mra per Dr. Debbrah Alar. AB

## 2023-02-02 ENCOUNTER — Other Ambulatory Visit: Payer: Self-pay | Admitting: Cardiology

## 2023-02-02 DIAGNOSIS — I1A0 Resistant hypertension: Secondary | ICD-10-CM

## 2023-02-11 ENCOUNTER — Other Ambulatory Visit: Payer: Self-pay | Admitting: Cardiology

## 2023-02-11 DIAGNOSIS — I5021 Acute systolic (congestive) heart failure: Secondary | ICD-10-CM

## 2023-02-24 LAB — PLATELET INHIBITION P2Y12

## 2023-04-08 ENCOUNTER — Ambulatory Visit: Payer: Medicare Other | Admitting: Cardiology

## 2023-04-09 ENCOUNTER — Other Ambulatory Visit: Payer: Self-pay | Admitting: Cardiology

## 2023-05-10 ENCOUNTER — Other Ambulatory Visit: Payer: Self-pay | Admitting: Cardiology

## 2023-05-10 DIAGNOSIS — I5021 Acute systolic (congestive) heart failure: Secondary | ICD-10-CM

## 2023-05-10 DIAGNOSIS — I1A0 Resistant hypertension: Secondary | ICD-10-CM

## 2023-05-25 ENCOUNTER — Ambulatory Visit: Payer: Medicare Other | Admitting: Cardiology

## 2023-05-27 ENCOUNTER — Ambulatory Visit: Payer: Medicare Other | Admitting: Cardiology

## 2023-05-27 ENCOUNTER — Encounter: Payer: Self-pay | Admitting: Cardiology

## 2023-05-27 VITALS — BP 179/58 | HR 64 | Resp 16 | Ht 71.0 in | Wt 176.2 lb

## 2023-05-27 DIAGNOSIS — E782 Mixed hyperlipidemia: Secondary | ICD-10-CM

## 2023-05-27 DIAGNOSIS — Z8673 Personal history of transient ischemic attack (TIA), and cerebral infarction without residual deficits: Secondary | ICD-10-CM

## 2023-05-27 DIAGNOSIS — Z952 Presence of prosthetic heart valve: Secondary | ICD-10-CM

## 2023-05-27 DIAGNOSIS — I5032 Chronic diastolic (congestive) heart failure: Secondary | ICD-10-CM

## 2023-05-27 DIAGNOSIS — I1A0 Resistant hypertension: Secondary | ICD-10-CM

## 2023-05-27 DIAGNOSIS — I4892 Unspecified atrial flutter: Secondary | ICD-10-CM

## 2023-05-27 DIAGNOSIS — Z7901 Long term (current) use of anticoagulants: Secondary | ICD-10-CM

## 2023-05-27 DIAGNOSIS — I480 Paroxysmal atrial fibrillation: Secondary | ICD-10-CM

## 2023-05-27 NOTE — Progress Notes (Signed)
ID:  Nicholas Shelton, DOB [birth year], MRN [id number]  PCP:  Barbie Banner, MD  Cardiologist:  Ivette Loyal, DO, [facility] (established care 12/13/2020) Cardiac Electrophysiologist: Dr. Steffanie Dunn  Date: 05/27/23 Last Office Visit: 11/21/2022  Chief Complaint  Patient presents with   Follow-up    6 month follow up for aortic stenosis and HFrEF    HPI  Nicholas Shelton is a 82 y.o. male whose past medical history and cardiovascular risk factors include: Hx of aortic stenosis s/p 29mm Edward Sapien 3 Valve, heart failure with reduced EF, retinal artery branch occlusion, paroxysmal atrial fibrillation/flutter, saccular left PCOM aneurysm (s/p  endovascular aneurysm embolization of 4 mm supraclinoid left ICA aneurysm and 5 mm blister like aneurysm of the ophthalmic segment of the left ICA), hypertension, hyperlipidemia, non-insulin-dependent diabetes mellitus type 2, former smoker, advanced age.  Known history of aortic stenosis which was monitored by routine echocardiography.  Though he remained asymptomatic he was noted to have reduced LVEF and underwent TAVR with 29 mm Edwards SAPIEN 3 valve.  Given his underlying cardiomyopathy his GDMT was uptitrated to maximally tolerated doses.  He required the reinitiation of clonidine help improve his home blood pressures.  Presents today for 67-month follow-up visit.  Over the last 6 months he denies anginal chest pain or heart failure symptoms.  His overall functional capacity remains stable.  He had a repeat echocardiogram since last office visit which notes improvement in LVEF but still continues to have grade 3 diastolic dysfunction with elevated right atrial pressures.  However clinically appears euvolemic he is been compliant with his medications no recent labs available for review and he does not endorse evidence of bleeding despite being on anticoagulation given his history of paroxysmal atrial fibrillation/flutter  ALLERGIES: Allergies  Allergen Reactions    Tape     Pulls skin off   Trazodone And Nefazodone Other (See Comments)    Sluggishness the next morning    MEDICATION LIST PRIOR TO VISIT: Current Meds  Medication Sig   acetaminophen (TYLENOL) 500 MG tablet Take 500-1,000 mg by mouth every 8 (eight) hours as needed for moderate pain.   apixaban (ELIQUIS) 5 MG TABS tablet Take 1 tablet (5 mg total) by mouth 2 (two) times daily.   atorvastatin (LIPITOR) 10 MG tablet Take 10 mg by mouth daily.   bumetanide (BUMEX) 1 MG tablet TAKE 1 TABLET(1 MG) BY MOUTH EVERY MORNING   cloNIDine (CATAPRES) 0.3 MG tablet TAKE 1 TABLET(0.3 MG) BY MOUTH THREE TIMES DAILY   dapagliflozin propanediol (FARXIGA) 10 MG TABS tablet Take 1 tablet (10 mg total) by mouth daily.   hydrALAZINE (APRESOLINE) 100 MG tablet TAKE 1 TABLET(100 MG) BY MOUTH THREE TIMES DAILY   isosorbide dinitrate (ISORDIL) 20 MG tablet TAKE 2 TABLETS BY MOUTH THREE TIMES DAILY   metFORMIN (GLUCOPHAGE) 1000 MG tablet Take 1 tablet (1,000 mg total) by mouth 2 (two) times daily with a meal.   metoprolol succinate (TOPROL-XL) 25 MG 24 hr tablet TAKE 1 TABLET(25 MG) BY MOUTH DAILY   Multiple Vitamin (MULTI VITAMIN MENS PO) Take 1 tablet by mouth daily.   pantoprazole (PROTONIX) 40 MG tablet Take 40 mg by mouth daily before breakfast.   Psyllium (METAMUCIL PO) Take 1 Dose by mouth daily as needed (constipation).   sacubitril-valsartan (ENTRESTO) 97-103 MG Take 1 tablet by mouth 2 (two) times daily.   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   Degeneration of lumbar or lumbosacral intervertebral disc    Deviated nasal septum    Diabetes mellitus without complication (HCC)    Gastroesophageal reflux disease without esophagitis    Generalized anxiety disorder    Gout with  tophus    Hearing loss    Hyperlipidemia    Hypertension    Insomnia    Murmur    Aortic stenosis   Nasal turbinate hypertrophy    Nonischemic cardiomyopathy (HCC)    Osteoarthritis of wrist    PAC (premature atrial contraction)    PAF (paroxysmal atrial fibrillation) (HCC) 03/03/2022   Peptic ulcer    Peripheral neuropathy    PVC (premature ventricular contraction)    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    PAST SURGICAL HISTORY: Past Surgical History:  Procedure Laterality Date   CARDIAC CATHETERIZATION     CATARACT EXTRACTION Bilateral 08/2022   COLONOSCOPY     ELBOW SURGERY     pinched nerve on both arms   INGUINAL HERNIA REPAIR Right 10/30/2018   Procedure: OPEN REPAIR OF INCARCERATED RIGHT INGUINAL HERNIA WITH MESH;  Surgeon: Gaynelle Adu, MD;  Location: [facility] OR;  Service: General;  Laterality: Right;   IR 3D INDEPENDENT WKST  06/30/2022   IR ANGIO INTRA EXTRACRAN SEL COM CAROTID INNOMINATE UNI R MOD SED  06/30/2022   IR ANGIO INTRA EXTRACRAN SEL INTERNAL CAROTID UNI L MOD SED  06/30/2022   IR ANGIO VERTEBRAL SEL VERTEBRAL UNI L MOD SED  06/30/2022   IR ANGIOGRAM FOLLOW UP STUDY  07/02/2022   IR CT HEAD LTD  06/30/2022   IR TRANSCATH/EMBOLIZ  06/30/2022   IR US GUIDE VASC ACCESS RIGHT  06/30/2022   IR US GUIDE VASC ACCESS RIGHT  06/30/2022   JOINT REPLACEMENT     LEFT HEART CATH AND CORONARY ANGIOGRAPHY N/A 01/15/2021   Procedure: LEFT HEART CATH AND CORONARY ANGIOGRAPHY;  Surgeon: Yates Decamp, MD;  Location: [facility] INVASIVE CV LAB;  Service: Cardiovascular;  Laterality: N/A;   RADIOLOGY WITH ANESTHESIA N/A 06/30/2022   Procedure: Tawny Asal;  Surgeon: Baldemar Lenis, MD;  Location: [facility] OR;  Service: Radiology;  Laterality: N/A;   TEE  WITHOUT CARDIOVERSION N/A 12/24/2021   Procedure: TRANSESOPHAGEAL ECHOCARDIOGRAM (TEE);  Surgeon: Tonny Bollman, MD;  Location: [facility] OR;  Service: Open Heart Surgery;  Laterality: N/A;   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   Procedure: TOTAL KNEE ARTHROPLASTY;  Surgeon: Eugenia Mcalpine, MD;  Location: [facility] ORS;  Service: Orthopedics;  Laterality: Right;  with adductor canal   WRIST SURGERY     pinched nerve    FAMILY HISTORY: The patient family history includes Heart attack in his mother.  SOCIAL HISTORY:  The patient  reports that he quit smoking about 28 years ago. His smoking use included cigarettes. He started smoking about 43 years ago. He has a 7.5 pack-year smoking history. He has never used smokeless tobacco. He reports that he does not drink alcohol and does not use drugs.  REVIEW OF SYSTEMS: Review of Systems  Constitutional: Positive for weight loss. Negative for malaise/fatigue.  HENT:  Hard of hearing  Cardiovascular:  Negative for chest pain, dyspnea on exertion, leg swelling, near-syncope, orthopnea, palpitations, paroxysmal nocturnal dyspnea and syncope.  Respiratory:  Negative for shortness of breath.   Neurological:  Negative for dizziness, focal weakness and light-headedness.    PHYSICAL EXAM:    05/27/2023    2:14 PM 05/27/2023    1:56 PM 12/03/2022    2:33 PM  Vitals with BMI  Height  5\' 11"  5\' 11"   Weight  176 lbs 3 oz 185 lbs  BMI  24.59 25.81  Systolic 179 174 161  Diastolic 58 56 65  Pulse 64 73 51   Physical Exam  Constitutional: No distress.  Age appropriate, hemodynamically stable.   Neck: No JVD present.  Cardiovascular: Normal rate, regular rhythm, S1 normal, S2 normal, intact distal pulses and normal pulses. Exam reveals no gallop, no S3 and no S4.  No murmur heard. Pulmonary/Chest: Effort normal and breath sounds normal. No stridor. He has no wheezes. He has no rales.  Abdominal: Soft. Bowel sounds are normal. He exhibits no  distension. There is no abdominal tenderness.  Musculoskeletal:        General: No edema.     Cervical back: Neck supple.  Neurological: He is alert and oriented to person, place, and time. He has intact cranial nerves (2-12).  Skin: Skin is warm and moist.   RADIOLOGY:  MRI brain without contrast: 02/19/2022 1. No acute intracranial abnormality. 2. Moderate chronic small vessel ischemic disease. 3. 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  MRA head without contrast: 02/19/2022 1. Negative intracranial MRA for large vessel occlusion or other emergent finding. 2. Mild intracranial atherosclerotic disease without hemodynamically significant or correctable stenosis. 3. 4 mm saccular left PCOM aneurysm. This would likely be amendable to endovascular treatment. Consultation with neuro interventional radiology suggested.  CARDIAC DATABASE: EKG: April 27, 2023: Atypical atrial flutter, 53 bpm, without underlying injury pattern.  Echocardiogram: 06/18/2021: LVEF 50-55%, grade 2 diastolic dysfunction, moderate aortic stenosis, see report for additional details.  10/21/2021: LVEF 40%, severe aortic stenosis, see report for additional details.  01/22/2022 (fingerprint echo): LVEF 35-40%, global hypokinesis, mild LVH, grade 2 diastolic dysfunction, elevated LVEDP, RV size and function normal, severely elevated PASP, severely dilated left atrium, moderately dilated right atrium, mild MR, 21 9 mm SAPIEN 3 bioprosthetic aortic valve present, peak velocity 1.95 m/s, mean gradient 8.3 mmHg, AVA per VTI 1.97 cm, estimated RAP 8 mmHg.  February 2024:  1. Left ventricular ejection fraction, by estimation, is 55 to 60%. The  left ventricle has normal function. The left ventricle has no regional  wall motion abnormalities. The left ventricular internal cavity size was  mildly dilated. Left ventricular  diastolic parameters are consistent with Grade III diastolic dysfunction   (restrictive). Elevated left atrial pressure.   2. Right ventricular systolic function is normal. The right ventricular  size is normal.   3. Left atrial size was mildly dilated.   4. Right atrial size was moderately dilated.   5. Mild mitral valve regurgitation.   6. S/p TAVR (29 mm S3UR valve, procedure date 12/24/21) Peak and mean  gradients through the valve are 20 and 11 mm Hg respectively Trivial AI  Compared to echo from March 2023, mild increase in gradients, but LVEF is  improved. . The aortic valve has been  repaired/replaced. Aortic valve regurgitation is not visualized.   7. The inferior vena cava is dilated in size with <50% respiratory  variability, suggesting right atrial  pressure of 15 mmHg.    Stress Testing: Lexiscan Tetrofosmin Stress Test 12/24/2020: Nondiagnostic ECG stress. The left ventricle is dilated in stress images more than rest images, LV end-diastolic volume 175 mL. There is mild diaphragmatic attenuation artifact in the inferior wall. Superimposed on this there is a moderate-sized moderate decrease in counts in the basal inferior and inferolateral wall suggestive of ischemia. Gated SPECT imaging of the left ventricle was abnormal, demonstrating hypokinesis of the mid inferolateral wall, mid inferior wall and basal inferolateral wall. Stress LV EF is mildly dysfunctional 48%.  No previous exam available for comparison. High risk study.   Left heart catheterization 01/15/2021: LV: Normal LV systolic function.  EDP mildly elevated at 20 mmHg.  There is 22 mmHg peak to peak pressure gradient across the aortic valve.  Findings are consistent with mild aortic stenosis. Very mild coronary calcification and minimal luminal irregularity in the coronary vessels.  Right dominant circulation. Impression: No significant coronary disease angiography.  Coronary calcification is evident.  Normal LVEF and mild aortic stenosis. 50 mL contrast used.  Carotid artery duplex  12/19/2020: Minimal stenosis in the right internal carotid artery (1-15%).  Minimal stenosis in the left internal carotid artery (1-15%).  Mild heterogeneous plaque noted bilaterally.  Antegrade right vertebral artery flow. Antegrade left vertebral artery flow. External carotid artery stenosis is probably source of bruit. Follow up study is appropriate if clinically indicated.  Renal duplex: 03/23/2022 Renal:  Right: Normal size right kidney. Normal right Resisitive Index. No evidence of right renal artery stenosis. RRV flow present.         Cyst(s) noted.  Left:  Normal size of left kidney. Abnormal left Resisitve Index. 1-59% stenosis of the left renal artery. LRV flow present.         Cyst(s) noted- largest measuring 7.4 x 6.6 x 6.7 cm.  Mesenteric:  70 to 99% stenosis in the superior mesenteric artery.   LABORATORY DATA: External Labs: Collected: 11/29/2020 Creatinine 0.88 mg/dL. eGFR: 86 mL/min per 1.73 m Lipid profile: Total cholesterol 186, triglycerides 131, HDL 39, LDL 116, non HDL 147 Hemoglobin A1c: 7  External Labs: Collected: 04/03/2022 at [facility]. Hemoglobin 9.7 g/dL, hematocrit 16% Sodium 135, potassium four 5.2, chloride 104, bicarb 24, eGFR 57   LABORATORY DATA:    Latest Ref Rng & Units 06/30/2022    7:46 AM 05/15/2022    2:32 PM 03/26/2022    3:48 AM  CBC  WBC 4.0 - 10.5 K/uL 3.8   4.4   Hemoglobin 13.0 - 17.0 g/dL 10.9  60.4  9.6   Hematocrit 39.0 - 52.0 % 32.7  31.6  28.6   Platelets 150 - 400 K/uL 92   108        Latest Ref Rng & Units 11/12/2022    4:47 PM 10/22/2022   10:42 AM 09/25/2022   10:39 AM  CMP  Glucose 70 - 99 mg/dL 540  981  191   BUN 8 - 27 mg/dL 71  52  40   Creatinine 0.76 - 1.27 mg/dL 4.78  2.95  6.21   Sodium 134 - 144 mmol/L 139  139  140   Potassium 3.5 - 5.2 mmol/L 4.9  4.6  4.6   Chloride 96 - 106 mmol/L 103  102  103   CO2 20 - 29 mmol/L 22  22  22    Calcium 8.6 - 10.2 mg/dL 9.4   9.3  9.5     Lipid  Panel  Lab Results  Component Value Date   CHOL 125 03/23/2022   HDL 51 03/23/2022   LDLCALC 61 03/23/2022   TRIG 120 07/01/2022   CHOLHDL 2.5 03/23/2022     No components found for: "NTPROBNP" Recent Labs    10/22/22 1040 11/12/22 1646  PROBNP 3,122* 7,250*   No results for input(s): "TSH" in the last 8760 hours.   BMP Recent Labs    06/30/22 0746 09/25/22 1039 10/22/22 1042 11/12/22 1647  NA 137 140 139 139  K 4.5 4.6 4.6 4.9  CL 104 103 102 103  CO2 20* 22 22 22   GLUCOSE 124* 117* 195* 110*  BUN 45* 40* 52* 71*  CREATININE 1.49* 1.48* 1.79* 1.83*  CALCIUM 8.8* 9.5 9.3 9.4  GFRNONAA 47*  --   --   --     HEMOGLOBIN A1C Lab Results  Component Value Date   HGBA1C 6.2 (H) 02/19/2022   MPG 131.24 02/19/2022    Hepatic Function Panel No results for input(s): "PROT", "ALBUMIN", "AST", "ALT", "ALKPHOS", "BILITOT", "BILIDIR", "IBILI" in the last 8760 hours.    IMPRESSION:    ICD-10-CM   1. Chronic heart failure with preserved ejection fraction (HFpEF) (HCC)  I50.32 PCV ECHOCARDIOGRAM COMPLETE    EKG 12-Lead    Pro b natriuretic peptide (BNP)    Magnesium    CMP14+EGFR    2. Heart failure with improved ejection fraction (HFimpEF) (HCC)  I50.32 Pro b natriuretic peptide (BNP)    Magnesium    CMP14+EGFR    3. Paroxysmal atrial flutter (HCC)  I48.92     4. Paroxysmal atrial fibrillation (HCC)  I48.0     5. Long term (current) use of anticoagulants  Z79.01 Hemoglobin and hematocrit, blood    6. History of stroke  Z86.73     7. S/P TAVR (transcatheter aortic valve replacement)  Z95.2     8. Resistant hypertension  I1A.0     9. Mixed hyperlipidemia  E78.2 Lipid Panel With LDL/HDL Ratio    LDL cholesterol, direct       RECOMMENDATIONS: Neco Kling is a 82 y.o. male whose past medical history and cardiac risk factors include: Hx of aortic stenosis s/p 29mm Edward Sapien 3 Valve, heart failure with reduced EF, retinal artery  branch occlusion, paroxysmal atrial fibrillation/flutter, saccular left PCOM aneurysm, hypertension, hyperlipidemia, non-insulin-dependent diabetes mellitus type 2, former smoker, advanced age.  Chronic heart failure with preserved ejection fraction (HFpEF) (HCC) Heart failure with improved ejection fraction (HFimpEF) (HCC) Stage C, NYHA class II. Clinically he is euvolemic. Home blood pressures are better controlled with SBP ranging between 130-140 mmHg. LVEF has improved from 35-40% to 55-60% Medications reconciled. No medical testing warranted at this time.  Paroxysmal atrial flutter (HCC) Paroxysmal atrial fibrillation (HCC) Long term (current) use of anticoagulants Rate control: Metoprolol. Rhythm control: N/A. Thromboembolic prophylaxis: Eliquis. Does not endorse evidence of bleeding. CHA2DS2-VASc SCORE is 8 which correlates to 10.8% risk of stroke per year (HFpEF, HTN, age, DM, history of stroke, vascular disease).   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  Educated on importance of secondary prevention.  S/P TAVR (transcatheter aortic valve replacement) S/p  29mm Edward Sapien 3 Valve w/ Left subclavian approach in Feb 2023. Patient aware of antibiotic prophylaxis prior to dental procedures.  Resistant hypertension Office blood pressures have remained elevated on multiple occasions. Patient checks his blood pressures at home regularly and states the  ranges between 130-140 mmHg. No recent labs available for review.  Will check renal function. 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   Mixed hyperlipidemia Currently on statin therapy. Initial LDL levels were 165 mg/dL as of April 4098. Will check fasting lipid profile.  Patient has known history of conduction disease and has been evaluated by EP in the past.  No pacemaker implant was recommended at that time but  he is made aware of being cognizant of symptoms of lightheadedness/dizziness/near syncope or syncopal events.  Patient denies any symptoms over the last 6 months.  Will continue to monitor peripherally.  Overall stable from a cardiovascular standpoint would like to see him back on a 14-month basis sooner if needed.  FINAL MEDICATION LIST END OF ENCOUNTER: No orders of the defined types were placed in this encounter.    Current Outpatient Medications:    acetaminophen (TYLENOL) 500 MG tablet, Take 500-1,000 mg by mouth every 8 (eight) hours as needed for moderate pain., Disp: , Rfl:    apixaban (ELIQUIS) 5 MG TABS tablet, Take 1 tablet (5 mg total) by mouth 2 (two) times daily., Disp: 180 tablet, Rfl: 3   atorvastatin (LIPITOR) 10 MG tablet, Take 10 mg by mouth daily., Disp: , Rfl:    bumetanide (BUMEX) 1 MG tablet, TAKE 1 TABLET(1 MG) BY MOUTH EVERY MORNING, Disp: 90 tablet, Rfl: 0   cloNIDine (CATAPRES) 0.3 MG tablet, TAKE 1 TABLET(0.3 MG) BY MOUTH THREE TIMES DAILY, Disp: 270 tablet, Rfl: 0   dapagliflozin propanediol (FARXIGA) 10 MG TABS tablet, Take 1 tablet (10 mg total) by mouth daily., Disp: 90 tablet, Rfl: 3   hydrALAZINE (APRESOLINE) 100 MG tablet, TAKE 1 TABLET(100 MG) BY MOUTH THREE TIMES DAILY, Disp: 270 tablet, Rfl: 2   isosorbide dinitrate (ISORDIL) 20 MG tablet, TAKE 2 TABLETS BY MOUTH THREE TIMES DAILY, Disp: 540 tablet, Rfl: 0   metFORMIN (GLUCOPHAGE) 1000 MG tablet, Take 1 tablet (1,000 mg total) by mouth 2 (two) times daily with a meal., Disp: , Rfl:    metoprolol succinate (TOPROL-XL) 25 MG 24 hr tablet, TAKE 1 TABLET(25 MG) BY MOUTH DAILY, Disp: 90 tablet, Rfl: 3   Multiple Vitamin (MULTI VITAMIN MENS PO), Take 1 tablet by mouth daily., Disp: , Rfl:    pantoprazole (PROTONIX) 40 MG tablet, Take 40 mg by mouth daily before breakfast., Disp: , Rfl:    Psyllium (METAMUCIL PO), Take 1 Dose by mouth daily as needed (constipation)., Disp: , Rfl:    sacubitril-valsartan (ENTRESTO)  97-103 MG, Take 1 tablet by mouth 2 (two) times daily., Disp: , Rfl:    spironolactone (ALDACTONE) 50 MG tablet, TAKE 1 TABLET(50 MG) BY MOUTH DAILY, Disp: 90 tablet, Rfl: 2  Orders Placed This Encounter  Procedures   Pro b natriuretic peptide (BNP)   Magnesium   Lipid Panel With LDL/HDL Ratio   LDL cholesterol, direct   [id number]   Hemoglobin and hematocrit, blood   EKG 12-Lead   --Continue cardiac medications as reconciled in final medication list. --Return in about 6 months (around 11/27/2023) for Follow up chronic HFpEF, Afib/AFL, s/p TAVR. Or sooner if needed. --Continue follow-up with your primary care physician regarding the management of your other chronic comorbid conditions.  Patient's questions and concerns were addressed to his satisfaction. He voices understanding of the instructions provided during this encounter.   This note was created using a voice recognition software as a result there may be grammatical errors inadvertently enclosed that do not reflect  the nature of this encounter. Every attempt is made to correct such errors.  Tessa Lerner, [place], [facility]  Pager:  [phone] Office: [phone]

## 2023-05-30 LAB — CMP14+EGFR
ALT: 12 IU/L (ref 0–44)
AST: 17 IU/L (ref 0–40)
Albumin: 4.3 g/dL (ref 3.7–4.7)
Alkaline Phosphatase: 81 IU/L (ref 44–121)
BUN/Creatinine Ratio: 27 — ABNORMAL HIGH (ref 10–24)
BUN: 70 mg/dL — ABNORMAL HIGH (ref 8–27)
Bilirubin Total: 0.4 mg/dL (ref 0.0–1.2)
CO2: 19 mmol/L — ABNORMAL LOW (ref 20–29)
Calcium: 9.4 mg/dL (ref 8.6–10.2)
Chloride: 105 mmol/L (ref 96–106)
Creatinine, Ser: 2.61 mg/dL — ABNORMAL HIGH (ref 0.76–1.27)
Globulin, Total: 3.1 g/dL (ref 1.5–4.5)
Glucose: 121 mg/dL — ABNORMAL HIGH (ref 70–99)
Potassium: 5.3 mmol/L — ABNORMAL HIGH (ref 3.5–5.2)
Sodium: 142 mmol/L (ref 134–144)
Total Protein: 7.4 g/dL (ref 6.0–8.5)
eGFR: 24 mL/min/{1.73_m2} — ABNORMAL LOW (ref >59)

## 2023-05-30 LAB — LIPID PANEL WITH LDL/HDL RATIO
Cholesterol, Total: 130 mg/dL (ref 100–199)
HDL: 43 mg/dL (ref >39)
LDL Chol Calc (NIH): 67 mg/dL (ref 0–99)
LDL/HDL Ratio: 1.6 ratio (ref 0.0–3.6)
Triglycerides: 108 mg/dL (ref 0–149)
VLDL Cholesterol Cal: 20 mg/dL (ref 5–40)

## 2023-05-30 LAB — MAGNESIUM: Magnesium: 2.2 mg/dL (ref 1.6–2.3)

## 2023-05-30 LAB — LDL CHOLESTEROL, DIRECT: LDL Direct: 73 mg/dL (ref 0–99)

## 2023-05-30 LAB — PRO B NATRIURETIC PEPTIDE: NT-Pro BNP: 6132 pg/mL — ABNORMAL HIGH (ref 0–486)

## 2023-06-03 ENCOUNTER — Other Ambulatory Visit: Payer: Self-pay | Admitting: Cardiology

## 2023-06-03 DIAGNOSIS — E875 Hyperkalemia: Secondary | ICD-10-CM

## 2023-06-03 DIAGNOSIS — N179 Acute kidney failure, unspecified: Secondary | ICD-10-CM

## 2023-06-03 DIAGNOSIS — I480 Paroxysmal atrial fibrillation: Secondary | ICD-10-CM

## 2023-06-03 DIAGNOSIS — I4892 Unspecified atrial flutter: Secondary | ICD-10-CM

## 2023-06-03 MED ORDER — APIXABAN 2.5 MG PO TABS
2.5000 mg | ORAL_TABLET | Freq: Two times a day (BID) | ORAL | 0 refills | Status: DC
Start: 2023-06-03 — End: 2023-09-22

## 2023-06-03 NOTE — Progress Notes (Signed)
Dr. Odis Hollingshead spoke to patient

## 2023-06-10 ENCOUNTER — Other Ambulatory Visit: Payer: Self-pay | Admitting: Cardiology

## 2023-06-15 ENCOUNTER — Telehealth: Payer: Self-pay

## 2023-06-16 ENCOUNTER — Other Ambulatory Visit: Payer: Self-pay

## 2023-06-16 DIAGNOSIS — N179 Acute kidney failure, unspecified: Secondary | ICD-10-CM

## 2023-06-16 DIAGNOSIS — E875 Hyperkalemia: Secondary | ICD-10-CM

## 2023-06-16 NOTE — Progress Notes (Signed)
Called an spoke to Ms Schad Melka she voiced understanding

## 2023-07-07 LAB — BASIC METABOLIC PANEL: eGFR: 28 mL/min/{1.73_m2} — ABNORMAL LOW (ref >59)

## 2023-07-07 NOTE — Progress Notes (Signed)
Looks like patient got repeat labs yesterday pending review

## 2023-07-08 NOTE — Telephone Encounter (Signed)
NA

## 2023-07-10 ENCOUNTER — Telehealth: Payer: Self-pay

## 2023-07-15 ENCOUNTER — Other Ambulatory Visit: Payer: Self-pay

## 2023-07-15 MED ORDER — SACUBITRIL-VALSARTAN 49-51 MG PO TABS: 1.0000 | ORAL_TABLET | Freq: Two times a day (BID) | ORAL | 3 refills | Status: DC

## 2023-07-15 NOTE — Progress Notes (Signed)
Called patient and wife answered and was informed about the lab results. Patient understood and send a new prescription to [facility]

## 2023-07-17 NOTE — Telephone Encounter (Signed)
Tried calling Ms Dhruv Gusmano to review patient's most recent lab results no answer left a vm

## 2023-07-17 NOTE — Telephone Encounter (Signed)
Error

## 2023-08-07 ENCOUNTER — Other Ambulatory Visit (HOSPITAL_COMMUNITY): Payer: Self-pay

## 2023-08-07 ENCOUNTER — Telehealth: Payer: Self-pay | Admitting: Cardiology

## 2023-08-07 ENCOUNTER — Other Ambulatory Visit: Payer: Self-pay

## 2023-08-07 DIAGNOSIS — I10 Essential (primary) hypertension: Secondary | ICD-10-CM

## 2023-08-07 DIAGNOSIS — I5022 Chronic systolic (congestive) heart failure: Secondary | ICD-10-CM

## 2023-08-07 MED ORDER — HYDRALAZINE HCL 100 MG PO TABS
100.0000 mg | ORAL_TABLET | Freq: Three times a day (TID) | ORAL | 3 refills | Status: DC
  Filled 2023-08-07: qty 270, 90d supply, fill #0

## 2023-08-07 NOTE — Telephone Encounter (Signed)
Pt c/o medication issue:  1. Name of Medication: hydrALAZINE (APRESOLINE) 100 MG tablet   2. How are you currently taking this medication (dosage and times per day)? As directed  3. Are you having a reaction (difficulty breathing--STAT)? no  4. What is your medication issue? Patients relative, Darel Hong, states that the patient is running out of this medication and cannot afford the refills. She wants to know if there is a sample or a different kind of medication she can get. Please advise.

## 2023-08-07 NOTE — Telephone Encounter (Signed)
Called patient's sister-in-law about message. She stated that patient is in the doughnut hole and his pharmacy was telling patient it would be $400 for 3 month supply for hydralazine. Pharmacist at [facility] stated patient could get hydralazine 100 mg TID at [facility] for $27. She agreed to plan. Sent medication to [facility] for her to pick up.

## 2023-08-08 ENCOUNTER — Other Ambulatory Visit: Payer: Self-pay | Admitting: Cardiology

## 2023-08-08 DIAGNOSIS — I5021 Acute systolic (congestive) heart failure: Secondary | ICD-10-CM

## 2023-08-27 ENCOUNTER — Telehealth: Payer: Self-pay | Admitting: Cardiology

## 2023-08-27 NOTE — Telephone Encounter (Signed)
Nicholas Shelton states Pt is in donut hole but has been refused Pt assistance. They would like to request a change in medication. Nicholas Shelton that Dr Odis Hollingshead was not in the office until next week, but she stated the pt does have enough medication at the moment.

## 2023-08-27 NOTE — Telephone Encounter (Signed)
Pt c/o medication issue:  1. Name of Medication: Eliquis  2. How are you currently taking this medication (dosage and times per day)?   3. Are you having a reaction (difficulty breathing--STAT)?   4. What is your medication issue? Would like to change  to something else, Eliquis is too expensive

## 2023-08-28 NOTE — Telephone Encounter (Signed)
I am not sure if there is an easy fix for this.   1. Consider patient assistance for Eliquis.   2. See if Xarelto has better coverage.   3. Last option would be converting him to coumadin - make sure they understand the concept of INR check.   Let me know what they decide.   Maelee Hoot Rolfe, DO, [facility]

## 2023-08-31 NOTE — Telephone Encounter (Signed)
Spoke with Nicholas Shelton. They would like to try for patient assistance with eliquis. Told pt that pt assist would call with next steps. Gave the 1-800 number and will forward to assist team.

## 2023-09-03 ENCOUNTER — Telehealth: Payer: Self-pay | Admitting: Cardiology

## 2023-09-03 NOTE — Telephone Encounter (Signed)
Sister is asking that the nurse gives her a call about getting patient assistant. Please advise

## 2023-09-04 ENCOUNTER — Other Ambulatory Visit (HOSPITAL_COMMUNITY): Payer: Self-pay

## 2023-09-04 ENCOUNTER — Telehealth: Payer: Self-pay

## 2023-09-04 MED ORDER — SACUBITRIL-VALSARTAN 49-51 MG PO TABS: 1.0000 | ORAL_TABLET | Freq: Two times a day (BID) | ORAL | 3 refills | Status: DC

## 2023-09-04 MED ORDER — DAPAGLIFLOZIN PROPANEDIOL 10 MG PO TABS: 10.0000 mg | ORAL_TABLET | Freq: Every day | ORAL | 3 refills | Status: DC

## 2023-09-04 NOTE — Telephone Encounter (Signed)
Please send Rx for Entresto and Farxiga to [id number] - [place], [place] - [address]  with grant billing information in script comment (BIN: [id number], PCN: [id number], Group: [id number], HQ:[id number])

## 2023-09-04 NOTE — Telephone Encounter (Signed)
Paper Work Dropped Off:  APPLICATION FOR MED   Date:09/04/2023  Location of paper:  IN PINK FOLDER

## 2023-09-04 NOTE — Addendum Note (Signed)
Addended by: Macie Burows on: 09/04/2023 05:07 PM   Modules accepted: Orders

## 2023-09-04 NOTE — Telephone Encounter (Signed)
Darel Hong calling back to check status of pt assistance. Has not heard back from anyone.

## 2023-09-04 NOTE — Telephone Encounter (Signed)
Pt's [employer] patient assistance application was scanned to [employer], [facility] email. FYI

## 2023-09-04 NOTE — Telephone Encounter (Signed)
Patient Advocate Encounter   The patient was approved for a [employer] grant that will help cover the cost of ENTRESTO AND FARXIGA Total amount awarded, $10,000.  Effective: 08/05/23 - 08/03/24   BIN [id number] PCN [id number] [id number] ID [id number]   Pharmacy provided with approval and processing information. Patient informed via Dorcas Carrow, CPhT  Pharmacy Patient Advocate Specialist  Direct Number: [phone] Fax: [phone]

## 2023-09-04 NOTE — Telephone Encounter (Signed)
Prescriptions for Farxiga 10 mg and entresto 49/51 sent to [facility] in [place].  Requested discount code noted in both prescriptions.

## 2023-09-04 NOTE — Telephone Encounter (Signed)
I spoke with Darel Hong.  Patient is currently receiving Entresto assistance.  He has to re enroll.  Patient has completed paperwork.  They will drop paperwork and financial assistance information off in the office Darel Hong reports they have also mailed financial information and pharmacy costs to Eliquis assistance program.

## 2023-09-05 ENCOUNTER — Other Ambulatory Visit: Payer: Self-pay | Admitting: Cardiology

## 2023-09-22 ENCOUNTER — Other Ambulatory Visit: Payer: Self-pay | Admitting: Cardiology

## 2023-09-22 DIAGNOSIS — I4892 Unspecified atrial flutter: Secondary | ICD-10-CM

## 2023-09-22 DIAGNOSIS — I480 Paroxysmal atrial fibrillation: Secondary | ICD-10-CM

## 2023-09-22 NOTE — Telephone Encounter (Signed)
Prescription refill request for Eliquis received. Indication:aflutter Last office visit:7/24 Scr:2.27  8/24 Age: 82 Weight:79.9  kg  Prescription refilled

## 2023-09-23 ENCOUNTER — Telehealth: Payer: Self-pay | Admitting: Cardiology

## 2023-09-23 NOTE — Telephone Encounter (Signed)
Paper Work Dropped Off: [employer] Patient Assistance   Date: 09/23/2023  Location of paper:  Envelope will be in pink folder for [place] at front office, copies in abc folder

## 2023-09-23 NOTE — Telephone Encounter (Signed)
Pt's [employer] assistance application was scanned to [employer], [employer] email. FYI

## 2023-10-11 ENCOUNTER — Other Ambulatory Visit: Payer: Self-pay | Admitting: Cardiology

## 2023-10-11 DIAGNOSIS — I1A0 Resistant hypertension: Secondary | ICD-10-CM

## 2023-10-12 ENCOUNTER — Encounter (HOSPITAL_COMMUNITY): Payer: Self-pay | Admitting: *Deleted

## 2023-10-12 ENCOUNTER — Emergency Department (HOSPITAL_COMMUNITY)
Admission: EM | Admit: 2023-10-12 | Discharge: 2023-10-12 | Disposition: A | Discharge status: Home / Self Care | Payer: Medicare Other | Source: Home / Self Care | Attending: Emergency Medicine | Admitting: Emergency Medicine

## 2023-10-12 ENCOUNTER — Other Ambulatory Visit: Payer: Self-pay

## 2023-10-12 DIAGNOSIS — Z7901 Long term (current) use of anticoagulants: Secondary | ICD-10-CM | POA: Insufficient documentation

## 2023-10-12 DIAGNOSIS — Z515 Encounter for palliative care: Secondary | ICD-10-CM | POA: Diagnosis not present

## 2023-10-12 DIAGNOSIS — I480 Paroxysmal atrial fibrillation: Secondary | ICD-10-CM | POA: Insufficient documentation

## 2023-10-12 DIAGNOSIS — R4182 Altered mental status, unspecified: Secondary | ICD-10-CM | POA: Diagnosis not present

## 2023-10-12 DIAGNOSIS — M5440 Lumbago with sciatica, unspecified side: Secondary | ICD-10-CM | POA: Insufficient documentation

## 2023-10-12 DIAGNOSIS — Z7984 Long term (current) use of oral hypoglycemic drugs: Secondary | ICD-10-CM | POA: Insufficient documentation

## 2023-10-12 DIAGNOSIS — I10 Essential (primary) hypertension: Secondary | ICD-10-CM | POA: Insufficient documentation

## 2023-10-12 DIAGNOSIS — E119 Type 2 diabetes mellitus without complications: Secondary | ICD-10-CM | POA: Insufficient documentation

## 2023-10-12 MED ORDER — LIDOCAINE 5 % EX PTCH: 1.0000 | MEDICATED_PATCH | CUTANEOUS | 0 refills | Status: DC

## 2023-10-12 MED ORDER — KETOROLAC TROMETHAMINE 60 MG/2ML IM SOLN
30.0000 mg | Freq: Once | INTRAMUSCULAR | Status: AC
  Administered 2023-10-12: 30 mg via INTRAMUSCULAR
  Filled 2023-10-12: qty 2

## 2023-10-12 MED ORDER — METHYLPREDNISOLONE 4 MG PO TBPK: ORAL_TABLET | ORAL | 0 refills | Status: DC

## 2023-10-12 MED ORDER — CYCLOBENZAPRINE HCL 10 MG PO TABS: 10.0000 mg | ORAL_TABLET | Freq: Every day | ORAL | 0 refills | Status: DC

## 2023-10-12 NOTE — ED Notes (Signed)
Secondary RN reviewed discharge instructions with patient. She verbalized understanding and denied any further questions. PT well appearing upon discharge and reports tolerable pain. Pt wheeled to exit. Pt endorses ride home.

## 2023-10-12 NOTE — ED Provider Notes (Signed)
[place] EMERGENCY DEPARTMENT AT [facility] Provider Note  CSN: [id number] Arrival date & time: 10/12/23 0845  Chief Complaint(s) Back Pain  HPI [name] is a 82 y.o. male today for back pain.  1 week ago, the patient was visiting family in a nursing home, he tried to pull himself up using a side rail of the bed and felt something pull in his lower back.  Since then, has been having increasing discomfort.  Last evening he had a fall while getting up from the toilet and landed on the ground.  Did not strike his head, no loss of consciousness.  He is here today with his daughter at bedside.   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   Degeneration of lumbar or lumbosacral intervertebral disc    Deviated nasal septum    Diabetes mellitus without complication (HCC)    Gastroesophageal reflux disease without esophagitis    Generalized anxiety disorder    Gout with tophus    Hearing loss    Hyperlipidemia    Hypertension    Insomnia    Murmur    Aortic stenosis   Nasal turbinate hypertrophy    Nonischemic cardiomyopathy (HCC)    Osteoarthritis of wrist    PAC (premature atrial contraction)    PAF (paroxysmal atrial fibrillation) (HCC) 03/03/2022   Peptic ulcer    Peripheral neuropathy    PVC (premature ventricular contraction)    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   Hypokalemia 12/25/2021   HTN (hypertension) 12/24/2021   HLD (hyperlipidemia) 12/24/2021   Type 2 diabetes mellitus with complication, without long-term current use of insulin (HCC) 12/24/2021   Hx of tobacco use, presenting hazards to health 12/24/2021   Nonischemic cardiomyopathy (HCC) 12/24/2021   Severe aortic stenosis 12/24/2021   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 Thomasena Edis 01/14/2021   Incarcerated inguinal hernia 10/30/2018   Home Medication(s) Prior to Admission medications   Medication Sig Start Date End Date Taking? Authorizing Provider  acetaminophen (TYLENOL) 500 MG tablet Take 500-1,000 mg by mouth every 8 (eight) hours as needed for moderate pain.   Yes [provider]  apixaban (ELIQUIS) 2.5 MG TABS tablet TAKE 1 TABLET(2.5 MG) BY MOUTH TWICE DAILY 09/22/23  Yes Tolia, Sunit, DO  atorvastatin (LIPITOR) 10 MG tablet Take 10 mg by mouth every evening. 02/25/22  Yes [provider]  bumetanide (BUMEX) 1 MG tablet TAKE 1 TABLET(1 MG) BY MOUTH EVERY MORNING 08/10/23  Yes Tolia, Sunit, DO  cloNIDine (CATAPRES) 0.3 MG tablet TAKE 1 TABLET(0.3 MG) BY MOUTH THREE TIMES DAILY 05/11/23  Yes  Tolia, Sunit, DO  cyclobenzaprine (FLEXERIL) 10 MG tablet Take 1 tablet (10 mg total) by mouth at bedtime for 10 days. 10/12/23 10/22/23 Yes Anders Simmonds T, DO  dapagliflozin propanediol (FARXIGA) 10 MG TABS tablet Take 1 tablet (10 mg total) by mouth daily. 09/04/23  Yes Tolia, Sunit, DO  hydrALAZINE (APRESOLINE) 100 MG tablet Take 1 tablet (100 mg total) by mouth 3 (three) times daily. 08/07/23  Yes Tolia, Sunit, DO  isosorbide dinitrate (ISORDIL) 20 MG tablet TAKE 2 TABLET  BY MOUTH THREE TIMES DAILY 09/07/23  Yes Tolia, Sunit, DO  lidocaine (LIDODERM) 5 % Place 1 patch onto the skin daily. Remove & Discard patch within 12 hours or as directed by MD 10/12/23  Yes Anders Simmonds T, DO  metFORMIN (GLUCOPHAGE) 1000 MG tablet Take 1 tablet (1,000 mg total) by mouth 2 (two) times daily with a meal. 01/17/21  Yes Yates Decamp, MD  methylPREDNISolone (MEDROL DOSEPAK) 4 MG TBPK tablet Take as instructed by dose packaging 10/12/23  Yes Anders Simmonds T, DO  metoprolol succinate (TOPROL-XL) 25 MG 24 hr tablet TAKE 1 TABLET(25 MG) BY MOUTH DAILY 05/11/23  Yes Tolia, Sunit, DO  Multiple Vitamin (MULTI VITAMIN MENS PO) Take 1 tablet by mouth daily.   Yes [provider]  pantoprazole (PROTONIX) 40 MG tablet Take 40 mg by mouth daily before breakfast.   Yes [provider]  Psyllium (METAMUCIL PO) Take 1 capsule by mouth daily.   Yes [provider]  sacubitril-valsartan (ENTRESTO) 49-51 MG Take 1 tablet by mouth 2 (two) times daily. 09/04/23  Yes Tessa Lerner, DO                                                                                                                                    Past Surgical History Past Surgical History:  Procedure Laterality Date   CARDIAC CATHETERIZATION     CATARACT EXTRACTION Bilateral 08/2022   COLONOSCOPY     ELBOW SURGERY     pinched nerve on both arms   INGUINAL HERNIA REPAIR Right 10/30/2018   Procedure: OPEN REPAIR OF INCARCERATED RIGHT INGUINAL HERNIA WITH MESH;  Surgeon: Gaynelle Adu, MD;  Location: [facility] OR;  Service: General;  Laterality: Right;   IR 3D INDEPENDENT WKST  06/30/2022   IR ANGIO INTRA EXTRACRAN SEL COM CAROTID INNOMINATE UNI R MOD SED  06/30/2022   IR ANGIO INTRA EXTRACRAN SEL INTERNAL CAROTID UNI L MOD SED  06/30/2022   IR ANGIO VERTEBRAL SEL VERTEBRAL UNI L MOD SED  06/30/2022   IR ANGIOGRAM FOLLOW UP STUDY  07/02/2022   IR CT HEAD LTD  06/30/2022   IR TRANSCATH/EMBOLIZ  06/30/2022   IR US  GUIDE VASC ACCESS RIGHT  06/30/2022   IR US GUIDE VASC ACCESS RIGHT  06/30/2022   JOINT REPLACEMENT     LEFT HEART CATH AND CORONARY ANGIOGRAPHY N/A 01/15/2021   Procedure: LEFT HEART CATH AND CORONARY ANGIOGRAPHY;  Surgeon: Yates Decamp, MD;  Location: [facility] INVASIVE CV LAB;  Service: Cardiovascular;  Laterality: N/A;   RADIOLOGY WITH ANESTHESIA N/A 06/30/2022   Procedure: Tawny Asal;  Surgeon: Baldemar Lenis, MD;  Location: [facility] OR;  Service: Radiology;  Laterality: N/A;   TEE WITHOUT CARDIOVERSION N/A 12/24/2021   Procedure: TRANSESOPHAGEAL ECHOCARDIOGRAM (TEE);  Surgeon: Tonny Bollman, MD;  Location: [facility] OR;  Service: Open Heart Surgery;  Laterality: N/A;   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   Procedure: TOTAL KNEE ARTHROPLASTY;  Surgeon: Eugenia Mcalpine, MD;  Location: [facility] ORS;  Service: Orthopedics;  Laterality: Right;  with adductor canal   WRIST SURGERY     pinched nerve   Family History Family History  Problem Relation Age of Onset   Heart attack Mother     Social History Social History   Tobacco Use   Smoking status: Former    Current packs/day: 0.00    Average packs/day: 0.5 packs/day for 15.0 years (7.5 ttl pk-yrs)    Types: Cigarettes    Start date: 42    Quit date: 1996    Years since quitting: 28.9   Smokeless tobacco: Never  Vaping Use   Vaping status: Never Used  Substance Use Topics   Alcohol use: No   Drug use: No   Allergies Tape and Trazodone and nefazodone  Review of Systems Review of Systems  Physical Exam Vital Signs  I have reviewed the triage vital signs BP 139/87 (BP Location: Right Arm)   Pulse 78   Temp 98.6 F (37 C)   Resp (!) 22   Ht 5\' 11"  (1.803 m)   Wt 81.6 kg   SpO2 99%   BMI 25.10 kg/m   Physical Exam Vitals reviewed.  HENT:     Head: Normocephalic and atraumatic.  Cardiovascular:     Rate and Rhythm: Normal rate.  Pulmonary:     Effort: Pulmonary effort is normal.  Musculoskeletal:         General: Normal range of motion.     Cervical back: Normal range of motion.     Comments: Positive straight leg test on the right  No midline spinous process tenderness in the lumbar spine.  Neurological:     Mental Status: He is alert.     Gait: Gait normal.     Comments: No saddle anesthesia, no bowel or bladder incontinence or retention.  No weakness in the bilateral lower extremities.  Normal sensation.     ED Results and Treatments Labs (all labs ordered are listed, but only abnormal results are displayed) Labs Reviewed - No data to display                                                                                                                        Radiology No results found.  Pertinent labs & imaging results that were available during my care of the patient were reviewed by me and considered in my medical decision making (see MDM for  details).  Medications Ordered in ED Medications  ketorolac (TORADOL) injection 30 mg (30 mg Intramuscular Given 10/12/23 0943)                                                                                                                                     Procedures Procedures  (including critical care time)  Medical Decision Making / ED Course   This patient presents to the ED for concern of neck pain, this involves an extensive number of treatment options, and is a complaint that carries with it a high risk of complications and morbidity.  The differential diagnosis includes musculoskeletal back pain, degenerative disc disease, less likely cord compression, less likely infectious process, less likely aortic pathology, less likely renal pathology.  MDM: Patient symptoms are most consistent with musculoskeletal back pain.  Has a clear inciting cause for it, exam is consistent with musculoskeletal back pain.  Based on his exam, history, and is less likely to be [place]o or nephrolithiasis.  Will not obtain urine.  Patient overall looks  well, has reassuring vital signs.  No indication for labs.  Discussed imaging with the patient the patient's daughter.  Based on duration of symptoms, and physical exam, believe symptomatic management is appropriate at this time.  Discussed expected time course for duration of symptoms.  They will follow-up with her PCP.  Update, family now requesting imaging.  Discussed with them how MRI was not indicated given symptoms and exam.  Will obtain CT imaging.  Reassessment 10:24 AM-family no longer wants to obtain imaging.  Will discharge.   Additional history obtained: -Additional history obtained from daughter at bedside -External records from outside source obtained and reviewed including: Chart review including previous notes, labs, imaging, consultation notes   Lab Tests: -I ordered, reviewed, and interpreted labs.   The pertinent results include:   Labs Reviewed - No data to display    EKG   EKG Interpretation Date/Time:    Ventricular Rate:    PR Interval:    QRS Duration:    QT Interval:    QTC Calculation:   R Axis:      Text Interpretation:           Imaging Studies ordered: I ordered imaging studies including  I independently visualized and interpreted imaging. I agree with the radiologist interpretation   Medicines ordered and prescription drug management: Meds ordered this encounter  Medications   ketorolac (TORADOL) injection 30 mg   methylPREDNISolone (MEDROL DOSEPAK) 4 MG TBPK tablet    Sig: Take as instructed by dose packaging    Dispense:  1 each    Refill:  0   lidocaine (LIDODERM) 5 %    Sig: Place 1 patch onto the skin daily. Remove & Discard patch within 12 hours or as directed by MD    Dispense:  30 patch    Refill:  0   cyclobenzaprine (FLEXERIL) 10 MG  tablet    Sig: Take 1 tablet (10 mg total) by mouth at bedtime for 10 days.    Dispense:  10 tablet    Refill:  0    -I have reviewed the patients home medicines and have made adjustments  as needed  Cardiac Monitoring: The patient was maintained on a cardiac monitor.  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   Degeneration of lumbar or lumbosacral intervertebral disc    Deviated nasal septum    Diabetes mellitus without complication (HCC)    Gastroesophageal reflux disease without esophagitis    Generalized anxiety disorder    Gout with tophus    Hearing loss    Hyperlipidemia    Hypertension    Insomnia    Murmur    Aortic stenosis   Nasal turbinate hypertrophy    Nonischemic cardiomyopathy (HCC)    Osteoarthritis of wrist    PAC (premature atrial contraction)    PAF (paroxysmal atrial fibrillation) (HCC) 03/03/2022   Peptic ulcer    Peripheral neuropathy    PVC (premature ventricular contraction)    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    Final Clinical Impression(s) / ED Diagnoses Final diagnoses:  Acute right-sided low back pain with sciatica, sciatica laterality unspecified     @PCDICTATION @    Anders Simmonds T, DO 10/12/23 1027

## 2023-10-12 NOTE — Discharge Instructions (Addendum)
You can take the steroid Dosepak as instructed by the dose packaging.  You may begin taking this medicine today.  You can also take 1000 mg of Tylenol every 8 hours.  You can apply lidocaine patch 2 times per day to the area.  In the evening, you can take a medicine called Flexeril.  This is a muscle relaxer.  Your symptoms should begin to improve over the next 1 to 2 weeks.  Please follow-up with your primary care doctor this week.

## 2023-10-12 NOTE — ED Triage Notes (Signed)
Daughter states patient was  visiting his wife in the NH 1 week ago was sitting in a low chair grabbed the siderail of her bed to pull himself up and pulled something in his lower back c/o increased pain since denies urinary or bowel incont. Daughter states he has had to use a walker since. Fell yest at home in the bathroom however daughter didn't think he was hurt from the fall.

## 2023-10-13 ENCOUNTER — Emergency Department (HOSPITAL_COMMUNITY): Payer: Medicare Other

## 2023-10-13 ENCOUNTER — Encounter (HOSPITAL_COMMUNITY): Payer: Self-pay

## 2023-10-13 ENCOUNTER — Inpatient Hospital Stay (HOSPITAL_COMMUNITY)
Admission: EM | Admit: 2023-10-13 | Discharge: 2023-11-11 | DRG: 951 | Disposition: E | Payer: Medicare Other | Attending: Pulmonary Disease | Admitting: Pulmonary Disease

## 2023-10-13 DIAGNOSIS — Z87891 Personal history of nicotine dependence: Secondary | ICD-10-CM

## 2023-10-13 DIAGNOSIS — Y92002 Bathroom of unspecified non-institutional (private) residence single-family (private) house as the place of occurrence of the external cause: Secondary | ICD-10-CM | POA: Diagnosis not present

## 2023-10-13 DIAGNOSIS — W1839XA Other fall on same level, initial encounter: Secondary | ICD-10-CM | POA: Diagnosis present

## 2023-10-13 DIAGNOSIS — D689 Coagulation defect, unspecified: Secondary | ICD-10-CM | POA: Diagnosis present

## 2023-10-13 DIAGNOSIS — Z8673 Personal history of transient ischemic attack (TIA), and cerebral infarction without residual deficits: Secondary | ICD-10-CM

## 2023-10-13 DIAGNOSIS — Z79899 Other long term (current) drug therapy: Secondary | ICD-10-CM

## 2023-10-13 DIAGNOSIS — M1990 Unspecified osteoarthritis, unspecified site: Secondary | ICD-10-CM | POA: Diagnosis present

## 2023-10-13 DIAGNOSIS — D649 Anemia, unspecified: Secondary | ICD-10-CM | POA: Diagnosis not present

## 2023-10-13 DIAGNOSIS — I480 Paroxysmal atrial fibrillation: Secondary | ICD-10-CM | POA: Diagnosis present

## 2023-10-13 DIAGNOSIS — R531 Weakness: Secondary | ICD-10-CM | POA: Diagnosis present

## 2023-10-13 DIAGNOSIS — I130 Hypertensive heart and chronic kidney disease with heart failure and stage 1 through stage 4 chronic kidney disease, or unspecified chronic kidney disease: Secondary | ICD-10-CM | POA: Diagnosis present

## 2023-10-13 DIAGNOSIS — R4182 Altered mental status, unspecified: Secondary | ICD-10-CM | POA: Diagnosis present

## 2023-10-13 DIAGNOSIS — D631 Anemia in chronic kidney disease: Secondary | ICD-10-CM | POA: Diagnosis present

## 2023-10-13 DIAGNOSIS — Z515 Encounter for palliative care: Principal | ICD-10-CM

## 2023-10-13 DIAGNOSIS — I350 Nonrheumatic aortic (valve) stenosis: Secondary | ICD-10-CM | POA: Diagnosis present

## 2023-10-13 DIAGNOSIS — K7290 Hepatic failure, unspecified without coma: Secondary | ICD-10-CM | POA: Diagnosis present

## 2023-10-13 DIAGNOSIS — I5032 Chronic diastolic (congestive) heart failure: Secondary | ICD-10-CM | POA: Diagnosis present

## 2023-10-13 DIAGNOSIS — Z66 Do not resuscitate: Secondary | ICD-10-CM | POA: Diagnosis present

## 2023-10-13 DIAGNOSIS — A419 Sepsis, unspecified organism: Secondary | ICD-10-CM | POA: Diagnosis present

## 2023-10-13 DIAGNOSIS — K7200 Acute and subacute hepatic failure without coma: Secondary | ICD-10-CM | POA: Diagnosis present

## 2023-10-13 DIAGNOSIS — K219 Gastro-esophageal reflux disease without esophagitis: Secondary | ICD-10-CM | POA: Diagnosis present

## 2023-10-13 DIAGNOSIS — N179 Acute kidney failure, unspecified: Secondary | ICD-10-CM | POA: Diagnosis present

## 2023-10-13 DIAGNOSIS — E1142 Type 2 diabetes mellitus with diabetic polyneuropathy: Secondary | ICD-10-CM | POA: Diagnosis present

## 2023-10-13 DIAGNOSIS — I4892 Unspecified atrial flutter: Secondary | ICD-10-CM | POA: Diagnosis present

## 2023-10-13 DIAGNOSIS — E785 Hyperlipidemia, unspecified: Secondary | ICD-10-CM | POA: Diagnosis present

## 2023-10-13 DIAGNOSIS — Z888 Allergy status to other drugs, medicaments and biological substances status: Secondary | ICD-10-CM

## 2023-10-13 DIAGNOSIS — Z8249 Family history of ischemic heart disease and other diseases of the circulatory system: Secondary | ICD-10-CM | POA: Diagnosis not present

## 2023-10-13 DIAGNOSIS — N184 Chronic kidney disease, stage 4 (severe): Secondary | ICD-10-CM | POA: Diagnosis present

## 2023-10-13 DIAGNOSIS — Z9181 History of falling: Secondary | ICD-10-CM

## 2023-10-13 DIAGNOSIS — E1122 Type 2 diabetes mellitus with diabetic chronic kidney disease: Secondary | ICD-10-CM | POA: Diagnosis present

## 2023-10-13 DIAGNOSIS — Z860101 Personal history of adenomatous and serrated colon polyps: Secondary | ICD-10-CM

## 2023-10-13 DIAGNOSIS — J301 Allergic rhinitis due to pollen: Secondary | ICD-10-CM | POA: Diagnosis present

## 2023-10-13 DIAGNOSIS — Z7901 Long term (current) use of anticoagulants: Secondary | ICD-10-CM | POA: Diagnosis not present

## 2023-10-13 DIAGNOSIS — Z96651 Presence of right artificial knee joint: Secondary | ICD-10-CM | POA: Diagnosis present

## 2023-10-13 DIAGNOSIS — R011 Cardiac murmur, unspecified: Secondary | ICD-10-CM | POA: Diagnosis present

## 2023-10-13 DIAGNOSIS — R6520 Severe sepsis without septic shock: Secondary | ICD-10-CM | POA: Diagnosis present

## 2023-10-13 DIAGNOSIS — K7201 Acute and subacute hepatic failure with coma: Principal | ICD-10-CM

## 2023-10-13 DIAGNOSIS — K5900 Constipation, unspecified: Secondary | ICD-10-CM | POA: Diagnosis present

## 2023-10-13 DIAGNOSIS — M19039 Primary osteoarthritis, unspecified wrist: Secondary | ICD-10-CM | POA: Diagnosis present

## 2023-10-13 DIAGNOSIS — E11649 Type 2 diabetes mellitus with hypoglycemia without coma: Secondary | ICD-10-CM | POA: Diagnosis present

## 2023-10-13 DIAGNOSIS — Z8679 Personal history of other diseases of the circulatory system: Secondary | ICD-10-CM

## 2023-10-13 DIAGNOSIS — I428 Other cardiomyopathies: Secondary | ICD-10-CM | POA: Diagnosis present

## 2023-10-13 DIAGNOSIS — Z952 Presence of prosthetic heart valve: Secondary | ICD-10-CM | POA: Diagnosis not present

## 2023-10-13 DIAGNOSIS — Z7984 Long term (current) use of oral hypoglycemic drugs: Secondary | ICD-10-CM

## 2023-10-13 DIAGNOSIS — F411 Generalized anxiety disorder: Secondary | ICD-10-CM | POA: Diagnosis present

## 2023-10-13 DIAGNOSIS — Z9109 Other allergy status, other than to drugs and biological substances: Secondary | ICD-10-CM

## 2023-10-13 DIAGNOSIS — E872 Acidosis, unspecified: Secondary | ICD-10-CM | POA: Diagnosis present

## 2023-10-13 DIAGNOSIS — H9190 Unspecified hearing loss, unspecified ear: Secondary | ICD-10-CM | POA: Diagnosis present

## 2023-10-13 DIAGNOSIS — I493 Ventricular premature depolarization: Secondary | ICD-10-CM | POA: Diagnosis present

## 2023-10-13 DIAGNOSIS — K7682 Hepatic encephalopathy: Secondary | ICD-10-CM | POA: Diagnosis present

## 2023-10-13 DIAGNOSIS — M1A9XX1 Chronic gout, unspecified, with tophus (tophi): Secondary | ICD-10-CM | POA: Diagnosis present

## 2023-10-13 DIAGNOSIS — R680 Hypothermia, not associated with low environmental temperature: Secondary | ICD-10-CM | POA: Diagnosis present

## 2023-10-13 DIAGNOSIS — M5441 Lumbago with sciatica, right side: Secondary | ICD-10-CM | POA: Diagnosis present

## 2023-10-13 DIAGNOSIS — Z8711 Personal history of peptic ulcer disease: Secondary | ICD-10-CM

## 2023-10-13 DIAGNOSIS — L570 Actinic keratosis: Secondary | ICD-10-CM | POA: Diagnosis present

## 2023-10-13 LAB — CBG MONITORING, ED
Glucose-Capillary: 79 mg/dL (ref 70–99)
Glucose-Capillary: 113 mg/dL — ABNORMAL HIGH (ref 70–99)
Glucose-Capillary: 93 mg/dL (ref 70–99)
Glucose-Capillary: 89 mg/dL (ref 70–99)

## 2023-10-13 LAB — I-STAT CHEM 8, ED
BUN: 82 mg/dL — ABNORMAL HIGH (ref 8–23)
Calcium, Ion: 1 mmol/L — ABNORMAL LOW (ref 1.15–1.40)
Chloride: 113 mmol/L — ABNORMAL HIGH (ref 98–111)
Creatinine, Ser: 3.8 mg/dL — ABNORMAL HIGH (ref 0.61–1.24)
Glucose, Bld: 74 mg/dL (ref 70–99)
HCT: 22 % — ABNORMAL LOW (ref 39.0–52.0)
Hemoglobin: 7.5 g/dL — ABNORMAL LOW (ref 13.0–17.0)
Potassium: 5.1 mmol/L (ref 3.5–5.1)
Sodium: 141 mmol/L (ref 135–145)
TCO2: 8 mmol/L — ABNORMAL LOW (ref 22–32)

## 2023-10-13 LAB — COMPREHENSIVE METABOLIC PANEL
ALT: 642 U/L — ABNORMAL HIGH (ref 0–44)
AST: 1373 U/L — ABNORMAL HIGH (ref 15–41)
Albumin: 3 g/dL — ABNORMAL LOW (ref 3.5–5.0)
Alkaline Phosphatase: 116 U/L (ref 38–126)
BUN: 75 mg/dL — ABNORMAL HIGH (ref 8–23)
CO2: <7 mmol/L — ABNORMAL LOW (ref 22–32)
Calcium: 8.1 mg/dL — ABNORMAL LOW (ref 8.9–10.3)
Chloride: 108 mmol/L (ref 98–111)
Creatinine, Ser: 3.63 mg/dL — ABNORMAL HIGH (ref 0.61–1.24)
GFR, Estimated: 16 mL/min — ABNORMAL LOW (ref >60)
Glucose, Bld: 82 mg/dL (ref 70–99)
Potassium: 5.1 mmol/L (ref 3.5–5.1)
Sodium: 143 mmol/L (ref 135–145)
Total Bilirubin: 2 mg/dL — ABNORMAL HIGH (ref <1.2)
Total Protein: 6.9 g/dL (ref 6.5–8.1)

## 2023-10-13 LAB — URINALYSIS, W/ REFLEX TO CULTURE (INFECTION SUSPECTED)
Bacteria, UA: NONE SEEN
Bilirubin Urine: NEGATIVE
Glucose, UA: NEGATIVE mg/dL
Ketones, ur: NEGATIVE mg/dL
Leukocytes,Ua: NEGATIVE
Nitrite: NEGATIVE
Protein, ur: 100 mg/dL — AB
Specific Gravity, Urine: 1.014 (ref 1.005–1.030)
pH: 5 (ref 5.0–8.0)

## 2023-10-13 LAB — CBC WITH DIFFERENTIAL/PLATELET
Abs Immature Granulocytes: 0.23 10*3/uL — ABNORMAL HIGH (ref 0.00–0.07)
Basophils Absolute: 0 10*3/uL (ref 0.0–0.1)
Basophils Relative: 0 %
Eosinophils Absolute: 0 10*3/uL (ref 0.0–0.5)
Eosinophils Relative: 0 %
HCT: 22 % — ABNORMAL LOW (ref 39.0–52.0)
Hemoglobin: 5.8 g/dL — CL (ref 13.0–17.0)
Immature Granulocytes: 1 %
Lymphocytes Relative: 2 %
Lymphs Abs: 0.3 10*3/uL — ABNORMAL LOW (ref 0.7–4.0)
MCH: 20.6 pg — ABNORMAL LOW (ref 26.0–34.0)
MCHC: 26.4 g/dL — ABNORMAL LOW (ref 30.0–36.0)
MCV: 78.3 fL — ABNORMAL LOW (ref 80.0–100.0)
Monocytes Absolute: 1.5 10*3/uL — ABNORMAL HIGH (ref 0.1–1.0)
Monocytes Relative: 9 %
Neutro Abs: 14.3 10*3/uL — ABNORMAL HIGH (ref 1.7–7.7)
Neutrophils Relative %: 88 %
Platelets: 141 10*3/uL — ABNORMAL LOW (ref 150–400)
RBC: 2.81 MIL/uL — ABNORMAL LOW (ref 4.22–5.81)
RDW: 19.6 % — ABNORMAL HIGH (ref 11.5–15.5)
WBC: 16.3 10*3/uL — ABNORMAL HIGH (ref 4.0–10.5)
nRBC: 0.2 % (ref 0.0–0.2)

## 2023-10-13 LAB — BLOOD GAS, VENOUS
Acid-base deficit: 23.5 mmol/L — ABNORMAL HIGH (ref 0.0–2.0)
Acid-base deficit: 22.1 mmol/L — ABNORMAL HIGH (ref 0.0–2.0)
Bicarbonate: 5.8 mmol/L — ABNORMAL LOW (ref 20.0–28.0)
Bicarbonate: 6.2 mmol/L — ABNORMAL LOW (ref 20.0–28.0)
Drawn by: 442
Drawn by: 1124
O2 Saturation: 29.3 %
O2 Saturation: 48.7 %
Patient temperature: 36.7
Patient temperature: 35.6
pCO2, Ven: 22 mm[Hg] — ABNORMAL LOW (ref 44–60)
pCO2, Ven: 20 mm[Hg] — ABNORMAL LOW (ref 44–60)
pH, Ven: 7.03 — CL (ref 7.25–7.43)
pH, Ven: 7.1 — CL (ref 7.25–7.43)
pO2, Ven: 31 mm[Hg] — CL (ref 32–45)
pO2, Ven: 31 mm[Hg] — CL (ref 32–45)

## 2023-10-13 LAB — PREPARE RBC (CROSSMATCH)

## 2023-10-13 LAB — MAGNESIUM: Magnesium: 2.2 mg/dL (ref 1.7–2.4)

## 2023-10-13 LAB — GLUCOSE, RANDOM: Glucose, Bld: 109 mg/dL — ABNORMAL HIGH (ref 70–99)

## 2023-10-13 LAB — AMMONIA: Ammonia: 286 umol/L — ABNORMAL HIGH (ref 9–35)

## 2023-10-13 LAB — CBC
HCT: 26.3 % — ABNORMAL LOW (ref 39.0–52.0)
Hemoglobin: 7.4 g/dL — ABNORMAL LOW (ref 13.0–17.0)
MCH: 22.8 pg — ABNORMAL LOW (ref 26.0–34.0)
MCHC: 28.1 g/dL — ABNORMAL LOW (ref 30.0–36.0)
MCV: 81.2 fL (ref 80.0–100.0)
Platelets: 91 10*3/uL — ABNORMAL LOW (ref 150–400)
RBC: 3.24 MIL/uL — ABNORMAL LOW (ref 4.22–5.81)
RDW: 20.2 % — ABNORMAL HIGH (ref 11.5–15.5)
WBC: 17.1 10*3/uL — ABNORMAL HIGH (ref 4.0–10.5)
nRBC: 0.5 % — ABNORMAL HIGH (ref 0.0–0.2)

## 2023-10-13 LAB — PROTIME-INR
INR: 4 — ABNORMAL HIGH (ref 0.8–1.2)
Prothrombin Time: 38.8 s — ABNORMAL HIGH (ref 11.4–15.2)

## 2023-10-13 LAB — TYPE AND SCREEN
ABO/RH(D): O POS
Antibody Screen: NEGATIVE

## 2023-10-13 LAB — RAPID URINE DRUG SCREEN, HOSP PERFORMED
Amphetamines: NOT DETECTED
Barbiturates: NOT DETECTED
Benzodiazepines: NOT DETECTED
Cocaine: NOT DETECTED
Opiates: NOT DETECTED
Tetrahydrocannabinol: NOT DETECTED

## 2023-10-13 LAB — HEPATITIS PANEL, ACUTE
HCV Ab: NONREACTIVE
Hep A IgM: NONREACTIVE
Hep B C IgM: NONREACTIVE
Hepatitis B Surface Ag: NONREACTIVE

## 2023-10-13 LAB — ETHANOL: Alcohol, Ethyl (B): <10 mg/dL (ref <10)

## 2023-10-13 LAB — ACETAMINOPHEN LEVEL: Acetaminophen (Tylenol), Serum: <10 ug/mL — ABNORMAL LOW (ref 10–30)

## 2023-10-13 LAB — MRSA NEXT GEN BY PCR, NASAL: MRSA by PCR Next Gen: NOT DETECTED

## 2023-10-13 LAB — LACTIC ACID, PLASMA
Lactic Acid, Venous: >9 mmol/L (ref 0.5–1.9)
Lactic Acid, Venous: >9 mmol/L (ref 0.5–1.9)

## 2023-10-13 LAB — TROPONIN I (HIGH SENSITIVITY)
Troponin I (High Sensitivity): 505 ng/L (ref <18)
Troponin I (High Sensitivity): 607 ng/L (ref <18)

## 2023-10-13 MED ORDER — LACTATED RINGERS IV BOLUS
1000.0000 mL | Freq: Once | INTRAVENOUS | Status: AC
  Administered 2023-10-13: 1000 mL via INTRAVENOUS

## 2023-10-13 MED ORDER — LACTULOSE 10 GM/15ML PO SOLN: 30.0000 g | Freq: Once | ORAL | Status: DC

## 2023-10-13 MED ORDER — SODIUM CHLORIDE 0.9 % IV SOLN
2.0000 g | Freq: Once | INTRAVENOUS | Status: AC
  Administered 2023-10-13: 2 g via INTRAVENOUS
  Filled 2023-10-13: qty 12.5

## 2023-10-13 MED ORDER — ACETAMINOPHEN 650 MG RE SUPP: 650.0000 mg | Freq: Four times a day (QID) | RECTAL | Status: DC | PRN

## 2023-10-13 MED ORDER — DEXTROSE 5 % IV SOLN
12.5000 mg/kg/h | INTRAVENOUS | Status: DC
  Filled 2023-10-13: qty 90

## 2023-10-13 MED ORDER — PANTOPRAZOLE SODIUM 40 MG IV SOLR
40.0000 mg | Freq: Once | INTRAVENOUS | Status: AC
  Administered 2023-10-13: 40 mg via INTRAVENOUS
  Filled 2023-10-13: qty 10

## 2023-10-13 MED ORDER — GLYCOPYRROLATE 0.2 MG/ML IJ SOLN: 0.2000 mg | INTRAMUSCULAR | Status: DC | PRN

## 2023-10-13 MED ORDER — PANTOPRAZOLE SODIUM 40 MG IV SOLR: 40.0000 mg | Freq: Two times a day (BID) | INTRAVENOUS | Status: DC

## 2023-10-13 MED ORDER — POLYVINYL ALCOHOL 1.4 % OP SOLN: 1.0000 [drp] | Freq: Four times a day (QID) | OPHTHALMIC | Status: DC | PRN

## 2023-10-13 MED ORDER — POLYETHYLENE GLYCOL 3350 17 G PO PACK: 17.0000 g | PACK | Freq: Every day | ORAL | Status: DC | PRN

## 2023-10-13 MED ORDER — VANCOMYCIN HCL 1750 MG/350ML IV SOLN
1750.0000 mg | Freq: Once | INTRAVENOUS | Status: AC
  Administered 2023-10-13: 1750 mg via INTRAVENOUS
  Filled 2023-10-13: qty 350

## 2023-10-13 MED ORDER — AMIODARONE HCL IN DEXTROSE 360-4.14 MG/200ML-% IV SOLN
INTRAVENOUS | Status: AC
  Filled 2023-10-13: qty 200

## 2023-10-13 MED ORDER — HYDROMORPHONE BOLUS VIA INFUSION
1.0000 mg | INTRAVENOUS | Status: DC | PRN
Start: 2023-10-13 — End: 2023-10-15
  Administered 2023-10-13 – 2023-10-14 (×5): 1 mg via INTRAVENOUS

## 2023-10-13 MED ORDER — HYDROMORPHONE HCL-NACL 50-0.9 MG/50ML-% IV SOLN
0.0000 mg/h | INTRAVENOUS | Status: DC
  Administered 2023-10-13: 0.5 mg/h via INTRAVENOUS
  Administered 2023-10-14 (×3): 4 mg/h via INTRAVENOUS
  Filled 2023-10-13 (×5): qty 50

## 2023-10-13 MED ORDER — ACETYLCYSTEINE LOAD VIA INFUSION
150.0000 mg/kg | Freq: Once | INTRAVENOUS | Status: AC
  Administered 2023-10-13: 12240 mg via INTRAVENOUS
  Filled 2023-10-13: qty 402

## 2023-10-13 MED ORDER — GLYCOPYRROLATE 0.2 MG/ML IJ SOLN
0.2000 mg | INTRAMUSCULAR | Status: DC | PRN
  Administered 2023-10-13: 0.2 mg via INTRAVENOUS
  Filled 2023-10-13: qty 1

## 2023-10-13 MED ORDER — METOPROLOL TARTRATE 5 MG/5ML IV SOLN
5.0000 mg | Freq: Once | INTRAVENOUS | Status: AC
  Administered 2023-10-13: 5 mg via INTRAVENOUS
  Filled 2023-10-13: qty 5

## 2023-10-13 MED ORDER — VANCOMYCIN HCL IN DEXTROSE 1-5 GM/200ML-% IV SOLN: 1000.0000 mg | INTRAVENOUS | Status: DC

## 2023-10-13 MED ORDER — GLYCOPYRROLATE 1 MG PO TABS
1.0000 mg | ORAL_TABLET | ORAL | Status: DC | PRN
  Filled 2023-10-13: qty 1

## 2023-10-13 MED ORDER — AMIODARONE IV BOLUS ONLY 150 MG/100ML
INTRAVENOUS | Status: AC
  Filled 2023-10-13: qty 100

## 2023-10-13 MED ORDER — SODIUM BICARBONATE 8.4 % IV SOLN: 50.0000 meq | Freq: Once | INTRAVENOUS | Status: AC

## 2023-10-13 MED ORDER — SODIUM CHLORIDE 0.9% IV SOLUTION: Freq: Once | INTRAVENOUS | Status: AC

## 2023-10-13 MED ORDER — SODIUM CHLORIDE 0.9 % IV SOLN: INTRAVENOUS | Status: AC

## 2023-10-13 MED ORDER — DEXTROSE 5 % IV SOLN
6.2500 mg/kg/h | INTRAVENOUS | Status: DC
  Filled 2023-10-13: qty 90

## 2023-10-13 MED ORDER — DEXTROSE 50 % IV SOLN: 1.0000 | Freq: Once | INTRAVENOUS | Status: AC

## 2023-10-13 MED ORDER — SODIUM CHLORIDE 0.9 % IV SOLN: 2.0000 g | INTRAVENOUS | Status: DC

## 2023-10-13 MED ORDER — LACTULOSE ENEMA
300.0000 mL | Freq: Once | ORAL | Status: DC
  Filled 2023-10-13: qty 300

## 2023-10-13 MED ORDER — ACETAMINOPHEN 325 MG PO TABS: 650.0000 mg | ORAL_TABLET | Freq: Four times a day (QID) | ORAL | Status: DC | PRN

## 2023-10-13 MED ORDER — SODIUM BICARBONATE 8.4 % IV SOLN
50.0000 meq | Freq: Once | INTRAVENOUS | Status: AC
  Administered 2023-10-13: 50 meq via INTRAVENOUS
  Filled 2023-10-13: qty 50

## 2023-10-13 MED ORDER — SODIUM BICARBONATE 8.4 % IV SOLN
INTRAVENOUS | Status: AC
  Administered 2023-10-13: 50 meq via INTRAVENOUS
  Filled 2023-10-13: qty 50

## 2023-10-13 MED ORDER — DEXTROSE 50 % IV SOLN
INTRAVENOUS | Status: AC
  Administered 2023-10-13: 50 mL via INTRAVENOUS
  Filled 2023-10-13: qty 50

## 2023-10-13 MED ORDER — DOCUSATE SODIUM 100 MG PO CAPS: 100.0000 mg | ORAL_CAPSULE | Freq: Two times a day (BID) | ORAL | Status: DC | PRN

## 2023-10-13 NOTE — ED Notes (Signed)
Patient transported to CT 

## 2023-10-13 NOTE — ED Notes (Signed)
Paged cardiology [phone]

## 2023-10-13 NOTE — Progress Notes (Signed)
Pharmacy Antibiotic Note  Nicholas Shelton is a 82 y.o. male admitted on 10/13/2023 with sepsis.  Pharmacy has been consulted for vancomycin dosing.  Plan: Vancomycin 1750 mg IV x 1 dose. Vancomycin 1000 mg IV every 48 hours. Cefepime 2000 mg IV x 1 dose. F/U additional doses. Monitor labs, c/s, and vanco levels as indicated.  Height: 5\' 11"  (180.3 cm) Weight: 81.6 kg (180 lb) IBW/kg (Calculated) : 75.3  Temp (24hrs), Avg:96.5 F (35.8 C), Min:95.5 F (35.3 C), Max:98 F (36.7 C)  Recent Labs  Lab 10/13/23 0800 10/13/23 0808 10/13/23 0833  WBC 16.3*  --   --   CREATININE 3.63* 3.80*  --   LATICACIDVEN  --   --  >9.0*    Estimated Creatinine Clearance: 15.4 mL/min (A) (by C-G formula based on SCr of 3.8 mg/dL (H)).    Allergies  Allergen Reactions   Tape     Pulls skin off   Trazodone And Nefazodone Other (See Comments)    Sluggishness the next morning    Antimicrobials this admission: Vanco 12/3 >> Cefepime 12/3    Microbiology results: 12/3 BCx: pending  Thank you for allowing pharmacy to be a part of this patient's care.  Judeth Cornfield, PharmD Clinical Pharmacist 10/13/2023 11:11 AM

## 2023-10-13 NOTE — ED Triage Notes (Signed)
Per EMS, Pt, from home, presents w/ generalized weakness, hypoglycemia, and AMS.  Denies pain.   Pt had a fall yesterday.

## 2023-10-13 NOTE — ED Provider Notes (Signed)
[facility] AT [facility] Provider Note  CSN: [id number] Arrival date & time: 10/13/23 4098  Chief Complaint(s) No chief complaint on file.  HPI Nicholas Shelton is a 82 y.o. 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  Patient was seen yesterday after a fall and ultimately discharged after negative workup.  Last known well was approximately 4:30 PM yesterday evening when patient's daughter who is a former ER nurse saw him overall well.  This morning, patient was very confused and not answering questions appropriately.  Was found to be severely hypoglycemic in the 30s and received glucose by EMS prior to arrival.  Patient arrives confused and only intermittently answering questions.  Appears jaundiced on arrival.  Family states that he has been taking Tylenol for his back pain but they have overall lower suspicion that he overdosed and states that he took approximately 3 g yesterday.  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   Degeneration of lumbar or lumbosacral intervertebral disc    Deviated nasal septum    Diabetes mellitus without complication (HCC)    Gastroesophageal reflux disease without esophagitis    Generalized anxiety disorder    Gout with tophus    Hearing loss    Hyperlipidemia    Hypertension    Insomnia    Murmur    Aortic stenosis   Nasal turbinate hypertrophy    Nonischemic cardiomyopathy (HCC)    Osteoarthritis of wrist    PAC (premature atrial contraction)    PAF (paroxysmal  atrial fibrillation) (HCC) 03/03/2022   Peptic ulcer    Peripheral neuropathy    PVC (premature ventricular contraction)    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   Hypertension 03/22/2022   Retinal arterial branch occlusion 02/19/2022   Hypokalemia 12/25/2021   HTN (hypertension) 12/24/2021   HLD (hyperlipidemia) 12/24/2021   Type 2 diabetes mellitus with complication, without long-term current use of insulin (HCC) 12/24/2021   Hx of tobacco use, presenting hazards to health 12/24/2021   Nonischemic cardiomyopathy (HCC) 12/24/2021   Severe aortic stenosis 12/24/2021   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 Thomasena Edis 01/14/2021   Incarcerated inguinal hernia 10/30/2018   Home Medication(s) Prior to Admission medications   Medication Sig Start Date End Date Taking? Authorizing Provider  acetaminophen (TYLENOL) 500 MG tablet Take 500-1,000 mg by mouth every 8 (eight) hours as needed for moderate  pain.    [provider]  apixaban (ELIQUIS) 2.5 MG TABS tablet TAKE 1 TABLET(2.5 MG) BY MOUTH TWICE DAILY 09/22/23   Tolia, Sunit, DO  atorvastatin (LIPITOR) 10 MG tablet Take 10 mg by mouth every evening. 02/25/22   [provider]  bumetanide (BUMEX) 1 MG tablet TAKE 1 TABLET(1 MG) BY  MOUTH EVERY MORNING 08/10/23   Tolia, Sunit, DO  cloNIDine (CATAPRES) 0.3 MG tablet TAKE 1 TABLET(0.3 MG) BY MOUTH THREE TIMES DAILY 05/11/23   Tolia, Sunit, DO  cyclobenzaprine (FLEXERIL) 10 MG tablet Take 1 tablet (10 mg total) by mouth at bedtime for 10 days. 10/12/23 10/22/23  Anders Simmonds T, DO  dapagliflozin propanediol (FARXIGA) 10 MG TABS tablet Take 1 tablet (10 mg total) by mouth daily. 09/04/23   Tolia, Sunit, DO  hydrALAZINE (APRESOLINE) 100 MG tablet Take 1 tablet (100 mg total) by mouth 3 (three) times daily. 08/07/23   Tolia, Sunit, DO  isosorbide dinitrate (ISORDIL) 20 MG tablet TAKE 2 TABLET BY MOUTH THREE TIMES DAILY 09/07/23   Tolia, Sunit, DO  lidocaine (LIDODERM) 5 % Place 1 patch onto the skin daily. Remove & Discard patch within 12 hours or as directed by MD 10/12/23   Anders Simmonds T, DO  metFORMIN (GLUCOPHAGE) 1000 MG tablet Take 1 tablet (1,000 mg total) by mouth 2 (two) times daily with a meal. 01/17/21   Yates Decamp, MD  methylPREDNISolone (MEDROL DOSEPAK) 4 MG TBPK tablet Take as instructed by dose packaging 10/12/23   Anders Simmonds T, DO  metoprolol succinate (TOPROL-XL) 25 MG 24 hr tablet TAKE 1 TABLET(25 MG) BY MOUTH DAILY 05/11/23   Tolia, Sunit, DO  Multiple Vitamin (MULTI VITAMIN MENS PO) Take 1 tablet by mouth daily.    [provider]  pantoprazole (PROTONIX) 40 MG tablet Take 40 mg by mouth daily before breakfast.    [provider]  Psyllium (METAMUCIL PO) Take 1 capsule by mouth daily.    [provider]  sacubitril-valsartan (ENTRESTO) 49-51 MG Take 1 tablet by mouth 2 (two) times daily. 09/04/23   Tessa Lerner, DO                                                                                                                                    Past Surgical History Past Surgical History:  Procedure Laterality Date   CARDIAC CATHETERIZATION     CATARACT EXTRACTION Bilateral 08/2022   COLONOSCOPY     ELBOW SURGERY     pinched nerve  on both arms   INGUINAL HERNIA REPAIR Right 10/30/2018   Procedure: OPEN REPAIR OF INCARCERATED RIGHT INGUINAL HERNIA WITH MESH;  Surgeon: Gaynelle Adu, MD;  Location: [facility] OR;  Service: General;  Laterality: Right;   IR 3D INDEPENDENT WKST  06/30/2022   IR ANGIO INTRA EXTRACRAN SEL COM CAROTID INNOMINATE UNI R MOD SED  06/30/2022   IR ANGIO INTRA EXTRACRAN SEL INTERNAL CAROTID UNI L  MOD SED  06/30/2022   IR ANGIO VERTEBRAL SEL VERTEBRAL UNI L MOD SED  06/30/2022   IR ANGIOGRAM FOLLOW UP STUDY  07/02/2022   IR CT HEAD LTD  06/30/2022   IR TRANSCATH/EMBOLIZ  06/30/2022   IR US GUIDE VASC ACCESS RIGHT  06/30/2022   IR US GUIDE VASC ACCESS RIGHT  06/30/2022   JOINT REPLACEMENT     LEFT HEART CATH AND CORONARY ANGIOGRAPHY N/A 01/15/2021   Procedure: LEFT HEART CATH AND CORONARY ANGIOGRAPHY;  Surgeon: Yates Decamp, MD;  Location: [facility] INVASIVE CV LAB;  Service: Cardiovascular;  Laterality: N/A;   RADIOLOGY WITH ANESTHESIA N/A 06/30/2022   Procedure: Tawny Asal;  Surgeon: Baldemar Lenis, MD;  Location: [facility] OR;  Service: Radiology;  Laterality: N/A;   TEE WITHOUT CARDIOVERSION N/A 12/24/2021   Procedure: TRANSESOPHAGEAL ECHOCARDIOGRAM (TEE);  Surgeon: Tonny Bollman, MD;  Location: [facility] OR;  Service: Open Heart Surgery;  Laterality: N/A;   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   Procedure: TOTAL KNEE ARTHROPLASTY;  Surgeon: Eugenia Mcalpine, MD;  Location: [facility] ORS;  Service: Orthopedics;  Laterality: Right;  with adductor canal   WRIST SURGERY     pinched nerve   Family History Family History  Problem Relation Age of Onset   Heart attack Mother     Social History Social History   Tobacco Use   Smoking status: Former    Current packs/day: 0.00    Average packs/day: 0.5 packs/day for 15.0 years (7.5 ttl pk-yrs)    Types: Cigarettes    Start date: 57    Quit date: 1996    Years since quitting: 28.9   Smokeless tobacco: Never  Vaping Use   Vaping status: Never Used   Substance Use Topics   Alcohol use: No   Drug use: No   Allergies Tape and Trazodone and nefazodone  Review of Systems Review of Systems  Unable to perform ROS: Mental status change    Physical Exam Vital Signs  I have reviewed the triage vital signs There were no vitals taken for this visit.  Physical Exam Constitutional:      General: He is in acute distress.     Appearance: Normal appearance. He is ill-appearing.  HENT:     Head: Normocephalic and atraumatic.     Nose: No congestion or rhinorrhea.  Eyes:     General: Scleral icterus present.        Right eye: No discharge.        Left eye: No discharge.     Extraocular Movements: Extraocular movements intact.     Pupils: Pupils are equal, round, and reactive to light.  Cardiovascular:     Rate and Rhythm: Normal rate. Rhythm irregular.     Heart sounds: No murmur heard. Pulmonary:     Effort: No respiratory distress.     Breath sounds: No wheezing or rales.  Abdominal:     General: There is no distension.     Tenderness: There is no abdominal tenderness.  Musculoskeletal:        General: Normal range of motion.     Cervical back: Normal range of motion.  Skin:    General: Skin is warm and dry.     Coloration: Skin is jaundiced and pale.  Neurological:     General: No focal deficit present.     Mental Status: He is alert.     ED Results and Treatments Labs (all labs ordered are listed, but only abnormal results are displayed) Labs  Reviewed  CBC WITH DIFFERENTIAL/PLATELET - Abnormal; Notable for the following components:      Result Value   WBC 16.3 (*)    RBC 2.81 (*)    Hemoglobin 5.8 (*)    HCT 22.0 (*)    MCV 78.3 (*)    MCH 20.6 (*)    MCHC 26.4 (*)    RDW 19.6 (*)    Platelets 141 (*)    Neutro Abs 14.3 (*)    Lymphs Abs 0.3 (*)    Monocytes Absolute 1.5 (*)    Abs Immature Granulocytes 0.23 (*)    All other components within normal limits  I-STAT CHEM 8, ED - Abnormal; Notable for the  following components:   Chloride 113 (*)    BUN 82 (*)    Creatinine, Ser 3.80 (*)    Calcium, Ion 1.00 (*)    TCO2 8 (*)    Hemoglobin 7.5 (*)    HCT 22.0 (*)    All other components within normal limits  COMPREHENSIVE METABOLIC PANEL  AMMONIA  ETHANOL  MAGNESIUM  PROTIME-INR  BLOOD GAS, VENOUS  LACTIC ACID, PLASMA  LACTIC ACID, PLASMA  CBG MONITORING, ED  TROPONIN I (HIGH SENSITIVITY)                                                                                                                          Radiology No results found.  Pertinent labs & imaging results that were available during my care of the patient were reviewed by me and considered in my medical decision making (see MDM for details).  Medications Ordered in ED Medications  dextrose 50 % solution 50 mL (has no administration in time range)  dextrose 50 % solution (has no administration in time range)  metoprolol tartrate (LOPRESSOR) injection 5 mg (has no administration in time range)                                                                                                                                     Procedures .Critical Care  Performed by: Glendora Score, MD Authorized by: Glendora Score, MD   Critical care provider statement:    Critical care time (minutes):  104   Critical care was necessary to treat or prevent imminent or life-threatening deterioration of the following conditions:  Hepatic failure   Critical care was time spent personally by me on the  following activities:  Development of treatment plan with patient or surrogate, discussions with consultants, evaluation of patient's response to treatment, examination of patient, ordering and review of laboratory studies, ordering and review of radiographic studies, ordering and performing treatments and interventions, pulse oximetry, re-evaluation of patient's condition and review of old charts   (including critical care  time)  Medical Decision Making / ED Course   This patient presents to the ED for concern of altered mental status, this involves an extensive number of treatment options, and is a complaint that carries with it a high risk of complications and morbidity.  The differential diagnosis includes infection, metabolic/toxic encephalopathy, hypoglycemia, malperfusion, hypoxia, trauma or other intracranial process  MDM: Patient seen emergency room for evaluation of altered mental status.  Physical exam reveals a very ill-appearing patient with jaundiced, pale skin and an irregular heart rate.  Patient is encephalopathic on arrival.  Given patient's fall yesterday on blood thinners with no CT imaging done yesterday patient taken immediately to the CAT scanner for CT head which was reassuringly negative for a bleed.  On return from the CAT scan are, patient having frequent PVCs and intermittent accelerated junctional rhythms.  As patient's blood pressure has remained stable, we did a small metoprolol trial to see if PVC burden would decrease and unfortunately this prolonged the patient's runs of junctional rhythm.  Patient very symptomatic during these frequently indicating that he needs to defecate and the sensation improves when patient flips back to normal rhythm.  Spoke with Dr. Wyline Mood of cardiology who believes this is likely medical and due to the patient's acidosis.  Recommending correcting underlying medical issues and avoiding antiarrhythmics at this time.  Initial i-STAT blood work showing a pH of 7.03, BUN 82, creatinine 3.80.  Follow-up blood work ultimately obtained showing a hemoglobin of 5.8, leukocytosis to 16.3, lactic greater than 9, platelet count 141, INR 4.0, lab creatinine of 3.63, BUN 75, AST 1373, ALT 642, total bili 2.0.  Ammonia 286.  Lactulose enema ordered.  2 units of emergency blood ordered and patient is Hemoccult positive.  Protonix started.  Bicarb given.  As patient did not have any  true SIRS criteria prior to return of lab work, broad-spectrum antibiotics initially not ordered as patient presentation appeared to be more consistent with liver failure and less consistent with sepsis.  However with return of lab work, blood cultures and ordered and patient started on broad-spectrum antibiotics.  I spoke with the gastroenterologist on-call Dr. Levon Hedger who is recommending a Tylenol level, acute hepatitis panel and initiation of NAC.  Also recommending transfer to [facility], ICU.  I spoke with the ICU attending Dr. Drucie Ip who accepts the patient in transfer.  I also spoke with the [place] PA Willette Cluster to inform them that the ICU may request GIs help once admitted.  I did also have an extensive discussion about the patient's status with the family member and indicated that he is very sick and may have poor outcome which they voiced understanding.  Full code at this time.   Additional history obtained: -Additional history obtained from multiple family members -External records from outside source obtained and reviewed including: Chart review including previous notes, labs, imaging, consultation notes   Lab Tests: -I ordered, reviewed, and interpreted labs.   The pertinent results include:   Labs Reviewed  CBC WITH DIFFERENTIAL/PLATELET - Abnormal; Notable for the following components:      Result Value   WBC 16.3 (*)    RBC  2.81 (*)    Hemoglobin 5.8 (*)    HCT 22.0 (*)    MCV 78.3 (*)    MCH 20.6 (*)    MCHC 26.4 (*)    RDW 19.6 (*)    Platelets 141 (*)    Neutro Abs 14.3 (*)    Lymphs Abs 0.3 (*)    Monocytes Absolute 1.5 (*)    Abs Immature Granulocytes 0.23 (*)    All other components within normal limits  I-STAT CHEM 8, ED - Abnormal; Notable for the following components:   Chloride 113 (*)    BUN 82 (*)    Creatinine, Ser 3.80 (*)    Calcium, Ion 1.00 (*)    TCO2 8 (*)    Hemoglobin 7.5 (*)    HCT 22.0 (*)    All other components within normal limits   COMPREHENSIVE METABOLIC PANEL  AMMONIA  ETHANOL  MAGNESIUM  PROTIME-INR  BLOOD GAS, VENOUS  LACTIC ACID, PLASMA  LACTIC ACID, PLASMA  CBG MONITORING, ED  TROPONIN I (HIGH SENSITIVITY)      EKG   EKG Interpretation Date/Time:  Tuesday October 13 2023 08:31:03 EST Ventricular Rate:  95 PR Interval:    QRS Duration:  158 QT Interval:  436 QTC Calculation: 484 R Axis:   87  Text Interpretation: Atrial fibrillation Ventricular bigeminy Nonspecific intraventricular conduction delay multiple PVCs  \\ Confirmed by Jadea Shiffer ([id number]) on 10/13/2023 11:35:19 AM         Imaging Studies ordered: I ordered imaging studies including CT head, CT chest abdomen pelvis I independently visualized and interpreted imaging. I agree with the radiologist interpretation   Medicines ordered and prescription drug management: Meds ordered this encounter  Medications   dextrose 50 % solution 50 mL   dextrose 50 % solution    Wandra Mannan R: cabinet override   metoprolol tartrate (LOPRESSOR) injection 5 mg    -I have reviewed the patients home medicines and have made adjustments as needed  Critical interventions Broad-spectrum antibiotics, bicarb, packed red blood cells, Protonix  Consultations Obtained: I requested consultation with the gastroenterologist on-call, cardiologist on-call, intensivist on-call,  and discussed lab and imaging findings as well as pertinent plan - they recommend: NAC, Protonix, blood, resuscitation, transfer to ICU   Cardiac Monitoring: The patient was maintained on a cardiac monitor.  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   Degeneration of lumbar or lumbosacral intervertebral disc    Deviated nasal septum    Diabetes mellitus without complication (HCC)    Gastroesophageal reflux disease without esophagitis    Generalized anxiety disorder    Gout with tophus    Hearing loss    Hyperlipidemia    Hypertension    Insomnia    Murmur    Aortic stenosis   Nasal turbinate hypertrophy    Nonischemic cardiomyopathy (HCC)    Osteoarthritis of wrist    PAC (premature atrial contraction)    PAF (paroxysmal atrial fibrillation) (HCC) 03/03/2022  Peptic ulcer    Peripheral neuropathy    PVC (premature ventricular contraction)    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     Final Clinical Impression(s) / ED Diagnoses Final diagnoses:  None     @PCDICTATION @    Glendora Score, MD 10/13/23 1136

## 2023-10-13 NOTE — Consult Note (Addendum)
@LOGO @   Referring Provider: Glendora Score, MD Primary Care Physician:  Barbie Banner, MD Primary Gastroenterologist:  [facility] previously.   Date of Admission: 10/13/23 Date of Consultation: 10/13/23  Reason for Consultation:  Acute liver failure  HPI:  Nicholas Shelton is a 82 y.o. 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  No labs or imaging done at that time as patient looked well overall.  He was discharged with Flexeril, lidocaine pack, and Medrol Dosepak. He returned to the ER today via EMS with generalized weakness, altered mental status, hypoglycemia.  ED course: Blood pressure/heart rate without significant abnormality. Temp markedly low at 95.5-96 range. Respiratory rate initially normal, but has been increasing, most recently 40.   SpO2 100% on room air.  Initial labs remarkable for hemoglobin 5.8, WBC 16.3, platelets 141 creatinine 3.63, AST 1373, ALT 642, total bilirubin 2.0, ammonia 286, INR 4.0, lactic acid >9, pH, vein 7.03. UA with no leukocytes or bacteria. EtOH negative. Troponin in process. Stool heme positive her nurse but no brbpr or melena.   Repeat Chem-8, 8 minutes after initial blood draw showed creatinine of 3.8, hemoglobin 7.5.  2 units prbcs have been ordered.  He was given lactulose enema, IV pantoprazole 40 mg.  NAC started empirically per Dr. Wilburt Finlay recommendation.    Consult:   Unable to obtain any history from patient due to encephalopathy. Unable to reach daughter by phone.   Last colonoscopy 03/01/2015 by Dr. Arlyce Dice: ENDOSCOPIC IMPRESSION:  1. Sessile polyp was found at the cecum; polypectomy was performed with a cold snare  2. Sessile polyp was found in the ascending colon; polypectomy was performed with a cold snare  3. Sessile polyp was found at the hepatic flexure; polypectomy was performed with a cold snare  4. Semi- pedunculated polyp was found in the descending colon; polypectomy was performed with a cold snare  5. Sessile polyp was found in the ascending colon; polypectomy was performed with cold forceps Pathology with tubular adenomas. Recommended 3-year surveillance.  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   Degeneration of lumbar or lumbosacral intervertebral disc    Deviated nasal septum    Diabetes mellitus without complication (HCC)    Gastroesophageal reflux disease without esophagitis    Generalized anxiety disorder    Gout with tophus    Hearing loss    Hyperlipidemia    Hypertension    Insomnia    Murmur    Aortic stenosis   Nasal turbinate hypertrophy    Nonischemic cardiomyopathy (HCC)    Osteoarthritis of wrist    PAC (premature atrial contraction)    PAF (paroxysmal atrial fibrillation) (HCC) 03/03/2022   Peptic ulcer    Peripheral neuropathy    PVC (premature ventricular contraction)    S/P TAVR (transcatheter aortic valve replacement) 12/24/2021  with 29mm S3AUR via Capron approach with Dr.Cooper and Dr. Laneta Simmers   Seasonal allergic rhinitis due to pollen    Stroke [facility])    02/19/22 MRI: small chronic right frontal & left cerebellar infarcts    Past Surgical History:  Procedure Laterality Date   CARDIAC CATHETERIZATION     CATARACT EXTRACTION Bilateral 08/2022   COLONOSCOPY     ELBOW  SURGERY     pinched nerve on both arms   INGUINAL HERNIA REPAIR Right 10/30/2018   Procedure: OPEN REPAIR OF INCARCERATED RIGHT INGUINAL HERNIA WITH MESH;  Surgeon: Gaynelle Adu, MD;  Location: [facility] OR;  Service: General;  Laterality: Right;   IR 3D INDEPENDENT WKST  06/30/2022   IR ANGIO INTRA EXTRACRAN SEL COM CAROTID INNOMINATE UNI R MOD SED  06/30/2022   IR ANGIO INTRA EXTRACRAN SEL INTERNAL CAROTID UNI L MOD SED  06/30/2022   IR ANGIO VERTEBRAL SEL VERTEBRAL UNI L MOD SED  06/30/2022   IR ANGIOGRAM FOLLOW UP STUDY  07/02/2022   IR CT HEAD LTD  06/30/2022   IR TRANSCATH/EMBOLIZ  06/30/2022   IR US GUIDE VASC ACCESS RIGHT  06/30/2022   IR US GUIDE VASC ACCESS RIGHT  06/30/2022   JOINT REPLACEMENT     LEFT HEART CATH AND CORONARY ANGIOGRAPHY N/A 01/15/2021   Procedure: LEFT HEART CATH AND CORONARY ANGIOGRAPHY;  Surgeon: Yates Decamp, MD;  Location: [facility] INVASIVE CV LAB;  Service: Cardiovascular;  Laterality: N/A;   RADIOLOGY WITH ANESTHESIA N/A 06/30/2022   Procedure: Tawny Asal;  Surgeon: Baldemar Lenis, MD;  Location: [facility] OR;  Service: Radiology;  Laterality: N/A;   TEE WITHOUT CARDIOVERSION N/A 12/24/2021   Procedure: TRANSESOPHAGEAL ECHOCARDIOGRAM (TEE);  Surgeon: Tonny Bollman, MD;  Location: [facility] OR;  Service: Open Heart Surgery;  Laterality: N/A;   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   Procedure: TOTAL KNEE ARTHROPLASTY;  Surgeon: Eugenia Mcalpine, MD;  Location: [facility] ORS;  Service: Orthopedics;  Laterality: Right;  with adductor canal   WRIST SURGERY     pinched nerve    Prior to Admission medications   Medication Sig Start Date End Date Taking? Authorizing Provider  apixaban (ELIQUIS) 2.5 MG TABS tablet TAKE 1 TABLET(2.5 MG) BY MOUTH TWICE DAILY Patient taking differently: Take 2.5 mg by mouth 2 (two) times daily. 09/22/23  Yes Tolia, Sunit, DO  atorvastatin (LIPITOR) 10 MG tablet Take 10 mg by mouth every evening. 02/25/22  Yes [provider]   cloNIDine (CATAPRES) 0.3 MG tablet TAKE 1 TABLET(0.3 MG) BY MOUTH THREE TIMES DAILY Patient taking differently: Take 0.3 mg by mouth 3 (three) times daily. 05/11/23  Yes Tolia, Sunit, DO  cyclobenzaprine (FLEXERIL) 10 MG tablet Take 1 tablet (10 mg total) by mouth at bedtime for 10 days. 10/12/23 10/22/23 Yes Anders Simmonds T, DO  dapagliflozin propanediol (FARXIGA) 10 MG TABS tablet Take 1 tablet (10 mg total) by mouth daily. 09/04/23  Yes Tolia, Sunit, DO  lidocaine (LIDODERM) 5 % Place 1 patch onto the skin daily. Remove & Discard patch within 12 hours or as directed by MD 10/12/23  Yes Anders Simmonds T, DO  metFORMIN (GLUCOPHAGE) 1000 MG tablet Take 1 tablet (1,000 mg total) by mouth 2 (two) times daily with a meal. 01/17/21  Yes Yates Decamp, MD  methylPREDNISolone (MEDROL DOSEPAK) 4 MG TBPK tablet Take as instructed by dose packaging 10/12/23  Yes Anders Simmonds T, DO  metoprolol succinate (TOPROL-XL) 25 MG 24 hr tablet TAKE 1 TABLET(25 MG) BY MOUTH DAILY Patient taking differently:  Take 25 mg by mouth daily. 05/11/23  Yes Tolia, Sunit, DO  Multiple Vitamin (MULTI VITAMIN MENS PO) Take 1 tablet by mouth daily.   Yes [provider]  pantoprazole (PROTONIX) 40 MG tablet Take 40 mg by mouth daily before breakfast.   Yes [provider]  Psyllium (METAMUCIL PO) Take 1 capsule by mouth daily.   Yes [provider]  sacubitril-valsartan (ENTRESTO) 49-51 MG Take 1 tablet by mouth 2 (two) times daily. 09/04/23  Yes Tolia, Sunit, DO  acetaminophen (TYLENOL) 500 MG tablet Take 500-1,000 mg by mouth every 8 (eight) hours as needed for moderate pain.    [provider]  bumetanide (BUMEX) 1 MG tablet TAKE 1 TABLET(1 MG) BY MOUTH EVERY MORNING Patient taking differently: Take 1 mg by mouth daily. 08/10/23   Tolia, Sunit, DO  hydrALAZINE (APRESOLINE) 100 MG tablet Take 1 tablet (100 mg total) by mouth 3 (three) times daily. 08/07/23   Tolia, Sunit, DO  isosorbide dinitrate  (ISORDIL) 20 MG tablet TAKE 2 TABLET BY MOUTH THREE TIMES DAILY Patient taking differently: Take 20 mg by mouth 3 (three) times daily. Take 40 mg by mouth 3 times daily 09/07/23   Odis Hollingshead, Sunit, DO    Current Facility-Administered Medications  Medication Dose Route Frequency Provider Last Rate Last Admin   acetylcysteine (ACETADOTE) 30.5 mg/mL load via infusion 12,240 mg  150 mg/kg Intravenous Once Kommor, Madison, MD       Followed by   acetylcysteine (ACETADOTE) 18,000 mg in dextrose 5 % 590 mL (30.5085 mg/mL) infusion  12.5 mg/kg/hr Intravenous Continuous Kommor, Madison, MD       Followed by   acetylcysteine (ACETADOTE) 18,000 mg in dextrose 5 % 590 mL (30.5085 mg/mL) infusion  6.25 mg/kg/hr Intravenous Continuous Kommor, Madison, MD       ceFEPIme (MAXIPIME) 2 g in sodium chloride 0.9 % 100 mL IVPB  2 g Intravenous Once Kommor, Madison, MD       lactulose (CHRONULAC) enema 200 gm  300 mL Rectal Once Kommor, Madison, MD       vancomycin (VANCOREADY) IVPB 1750 mg/350 mL  1,750 mg Intravenous Once Kommor, Madison, MD       Current Outpatient Medications  Medication Sig Dispense Refill   apixaban (ELIQUIS) 2.5 MG TABS tablet TAKE 1 TABLET(2.5 MG) BY MOUTH TWICE DAILY (Patient taking differently: Take 2.5 mg by mouth 2 (two) times daily.) 180 tablet 1   atorvastatin (LIPITOR) 10 MG tablet Take 10 mg by mouth every evening.     cloNIDine (CATAPRES) 0.3 MG tablet TAKE 1 TABLET(0.3 MG) BY MOUTH THREE TIMES DAILY (Patient taking differently: Take 0.3 mg by mouth 3 (three) times daily.) 270 tablet 0   cyclobenzaprine (FLEXERIL) 10 MG tablet Take 1 tablet (10 mg total) by mouth at bedtime for 10 days. 10 tablet 0   dapagliflozin propanediol (FARXIGA) 10 MG TABS tablet Take 1 tablet (10 mg total) by mouth daily. 90 tablet 3   lidocaine (LIDODERM) 5 % Place 1 patch onto the skin daily. Remove & Discard patch within 12 hours or as directed by MD 30 patch 0   metFORMIN (GLUCOPHAGE) 1000 MG tablet Take 1  tablet (1,000 mg total) by mouth 2 (two) times daily with a meal.     methylPREDNISolone (MEDROL DOSEPAK) 4 MG TBPK tablet Take as instructed by dose packaging 1 each 0   metoprolol succinate (TOPROL-XL) 25 MG 24 hr tablet TAKE 1 TABLET(25 MG) BY MOUTH DAILY (Patient taking differently: Take 25 mg  by mouth daily.) 90 tablet 3   Multiple Vitamin (MULTI VITAMIN MENS PO) Take 1 tablet by mouth daily.     pantoprazole (PROTONIX) 40 MG tablet Take 40 mg by mouth daily before breakfast.     Psyllium (METAMUCIL PO) Take 1 capsule by mouth daily.     sacubitril-valsartan (ENTRESTO) 49-51 MG Take 1 tablet by mouth 2 (two) times daily. 180 tablet 3   acetaminophen (TYLENOL) 500 MG tablet Take 500-1,000 mg by mouth every 8 (eight) hours as needed for moderate pain.     bumetanide (BUMEX) 1 MG tablet TAKE 1 TABLET(1 MG) BY MOUTH EVERY MORNING (Patient taking differently: Take 1 mg by mouth daily.) 90 tablet 3   hydrALAZINE (APRESOLINE) 100 MG tablet Take 1 tablet (100 mg total) by mouth 3 (three) times daily. 270 tablet 3   isosorbide dinitrate (ISORDIL) 20 MG tablet TAKE 2 TABLET BY MOUTH THREE TIMES DAILY (Patient taking differently: Take 20 mg by mouth 3 (three) times daily. Take 40 mg by mouth 3 times daily) 540 tablet 2    Allergies as of 10/13/2023 - Review Complete 10/13/2023  Allergen Reaction Noted   Tape  06/24/2022   Trazodone and nefazodone Other (See Comments) 02/19/2022    Family History  Problem Relation Age of Onset   Heart attack Mother     Social History   Socioeconomic History   Marital status: Married    Spouse name: Not on file   Number of children: 3   Years of education: Not on file   Highest education level: Not on file  Occupational History   Not on file  Tobacco Use   Smoking status: Former    Current packs/day: 0.00    Average packs/day: 0.5 packs/day for 15.0 years (7.5 ttl pk-yrs)    Types: Cigarettes    Start date: 10    Quit date: 42    Years since  quitting: 28.9   Smokeless tobacco: Never  Vaping Use   Vaping status: Never Used  Substance and Sexual Activity   Alcohol use: No   Drug use: No   Sexual activity: Not on file    Comment: married  Other Topics Concern   Not on file  Social History Narrative   Not on file   Social Determinants of Health   Financial Resource Strain: Not on file  Food Insecurity: Not on file  Transportation Needs: Not on file  Physical Activity: Not on file  Stress: Not on file  Social Connections: Not on file  Intimate Partner Violence: Not on file    Review of Systems: Unable to obtain information due to AMS.   Physical Exam: Vital signs in last 24 hours: Temp:  [95.5 F (35.3 C)-98 F (36.7 C)] 96.4 F (35.8 C) (12/03 1045) Pulse Rate:  [70-91] 70 (12/03 1045) Resp:  [28-44] 36 (12/03 1045) BP: (119-137)/(59-85) 119/77 (12/03 1045) SpO2:  [96 %-100 %] 100 % (12/03 1045) Weight:  [81.6 kg] 81.6 kg (12/03 0858)   General:  Ill appearing, eyes open, but just looking at the ceiling, appears to be in mild distress with moaning intermittently and increased respiratory rate, mild jaundice.     Head:  Normocephalic and atraumatic. Eyes:  Mild scleral icterus. Lungs:  Clear throughout to auscultation anteriorly. Increased respiratory rate. Heart:  Regular rate and rhythm; no murmurs, clicks, rubs,  or gallops. Abdomen:  Distended/full, but soft with no appreciable tenderness.  Difficult to hear bowel sounds due to movement/noise with breathing and  moaning. No masses, hepatosplenomegaly or hernias noted.  Rectal:  Deferred. Stool in disposable brief was light brown. Msk:  Symmetrical without gross deformities. Normal posture. Extremities:  With 1+ bilateral LE edema. Neurologic: Altered. Looks at me when I ask him for him name, but mumbles back with incoherent speech. Skin:  Intact without significant lesions or rashes.   Intake/Output from previous day: No intake/output data  recorded. Intake/Output this shift: Total I/O In: -  Out: 1100 [Urine:1100]  Lab Results: Recent Labs    10/13/23 0800 10/13/23 0808  WBC 16.3*  --   HGB 5.8* 7.5*  HCT 22.0* 22.0*  PLT 141*  --    BMET Recent Labs    10/13/23 0800 10/13/23 0808  NA 143 141  K 5.1 5.1  CL 108 113*  CO2 <7*  --   GLUCOSE 82 74  BUN 75* 82*  CREATININE 3.63* 3.80*  CALCIUM 8.1*  --    LFT Recent Labs    10/13/23 0800  PROT 6.9  ALBUMIN 3.0*  AST 1,373*  ALT 642*  ALKPHOS 116  BILITOT 2.0*   PT/INR Recent Labs    10/13/23 0800  LABPROT 38.8*  INR 4.0*   Hepatitis Panel No results for input(s): "HEPBSAG", "HCVAB", "HEPAIGM", "HEPBIGM" in the last 72 hours. C-Diff No results for input(s): "CDIFFTOX" in the last 72 hours.  Studies/Results: CT Head Wo Contrast  Result Date: 10/13/2023 CLINICAL DATA:  Delirium EXAM: CT HEAD WITHOUT CONTRAST TECHNIQUE: Contiguous axial images were obtained from the base of the skull through the vertex without intravenous contrast. RADIATION DOSE REDUCTION: This exam was performed according to the departmental dose-optimization program which includes automated exposure control, adjustment of the mA and/or kV according to patient size and/or use of iterative reconstruction technique. COMPARISON:  Brain MR 02/19/2022 FINDINGS: Brain: No hemorrhage. No hydrocephalus. No extra-axial fluid collection. 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 No mass effect. No mass lesion. Mineralization of the basal ganglia bilaterally. Vascular: No hyperdense vessel or unexpected calcification. Skull: Normal. Negative for fracture or focal lesion. Sinuses/Orbits: No middle ear or mastoid effusion. Paranasal sinuses are clear. Bilateral lens replacement. Orbits are otherwise unremarkable. Other: None. IMPRESSION: 1.  No CT evidence of intracranial injury. 2. 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 Electronically Signed   By: Lorenza Cambridge M.D.   On: 10/13/2023 08:35    Impression: 82 y.o. 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  No labs or imaging done at that time as patient looked well overall.  He was discharged with Flexeril, lidocaine pack, and Medrol Dosepak. He returned to the ER today via EMS with generalized weakness, altered mental status, hypoglycemia, found to be in acute liver failure, with lactic acidosis, acute on chronic kidney failure, anemia, and elevated troponin. GI consulted for acute liver failure.  Acute Liver Failure:  Per  chart review, appears that patient was in his usual state of health yesterday when he presented to the ER aside from back pain.  Now presenting with hepatic encephalopathy in the setting of acute liver failure. Abdomen appears distended/full on exam, but is non-tense, and with mild peripheral edema, but not sure of his baseline.  LFTs significantly elevated with AST 1373, ALT 642, total bilirubin 2.0, INR 4.0. MELD 3.0 is 34. LFTs previously normal in July 2024.  However, do note CT imaging in January 2023 suggesting cirrhosis.  It does not appear that he ever saw GI for this.  CT chest abdomen pelvis without contrast has been performed, but has not yet been read.  Etiology of acute  liver failure is not clear.  Due to altered mental status, unable to obtain any information from patient and I was unable to reach the daughter via phone today.  Doesn't appear any new medications were started recently in the outpatient setting aside from medications prescribed in the ER yesterday including Flexeril and Medrol Dosepak though it is unclear if he started these. In the setting of elevated lactic acid, >9, elevated troponin, acute on chronic kidney injury, query ischemic injury to the liver. Unable to rule out infection/sepsis contributing as well in the setting of elevated WBC count of 16.3.  He has been started empirically on broad spectrum antibiotics and IV fluids.   Acute hepatitis panel ordered by ER provider is in process.  We have recommended checking Tylenol level (<10), U tox, hep C and hep B viral load, labs for autoimmune hepatitis, and starting NAC empirically, but will hold off on ordering these labs this patient is being transferred to [facility] due to his critical status.   Hepatic encephalopathy:  In the setting of acute liver failure, ammonia elevated at 286. Some concern for unidentified infection as WBC is elevated. UA negative. CT C/A/P pending. Started empirically on broad-spectrum antibiotics.  Would consider diagnostic para if evidence of ascites on imaging.  Due to encephalopathy, unable to give PO, so lactulose enemas have been started.  Due to fall yesterday, CT head was performed today without bleed, age-indeterminate infarct in the superior right cerebellum.   Anemia:  Hgb initially 5.8, but repeat labs with hemoglobin of 7.5.  Last hemoglobin on file was 8.7 in April 2024.  No overt GI bleeding in the ER, but stool was heme positive.  He is chronically on Eliquis.  NSAID status unknown. I do not see that he is ever had an EGD.  Last colonoscopy in 2016.  ER provider ordered 2 units PRBCs. Will need EGD and colonoscopy once medically stable.    Plan: Start NAC  empirically Lactulose enemas until mental status improves for p.o. Follow-up on CT chest abdomen pelvis.  If evidence of ascites, should have paracentesis for at least diagnostic purposes. Check urine tox.  As patient is transferring to [facility], holding off on ordering additional work-up of liver failure, but recommend: HCV and HBV viral load, ANA, AMA, ASMA, immunoglobulins, ceruloplasmin (considering CKD).  Follow-up acute hepatitis panel.  Will need to monitor H&H closely and for overt GI bleeding. Transfuse for Hgb <7.  Will need EGD and colonoscopy updated once medically stable.  Ultimately will need to establish with GI for ongoing cirrhosis care in the outpatient setting. Grave prognosis.   LOS: 0 days    10/13/2023, 11:05 AM   Ermalinda Memos, [facility] Gastroenterology

## 2023-10-13 NOTE — ED Notes (Signed)
[facility] called for transport. 

## 2023-10-13 NOTE — ED Notes (Signed)
Lab called with critical result on this pt lactic acid grater then 9.0

## 2023-10-13 NOTE — H&P (Signed)
NAME:  Nicholas Shelton, MRN:  [id number], DOB:  31-May-1941, LOS: 0 ADMISSION DATE:  10/13/2023, CONSULTATION DATE:  10/13/23 REFERRING MD:  Dr. Posey Rea, CHIEF COMPLAINT:  AMS   History of Present Illness:  Pt encephalopathic, therefore HPI obtained from EMR review.  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  Pt evaluated in ER 12/2 with back pain and fall.  Felt a something pull in his back pulling himself up treating with tylenol at home.  Suffered a fall 12/2, no LOC and denied hitting head. Family unsure what caused the fall.  Pt treated with toradol and discharged home well appearing with lidocaine patches and flexeril.  Family doubt tylenol overuse. This morning, pt found confused by his daughter, LKW 1630 yesterday.  Found to be hypoglycemic with EMS.  Family denies any recent sick symptoms. Has complained of constipation, last BM yesterday, no blood in stool. Daughter notes he has had new BLE pitting edema for the past 5 days. Per family, pt has been declining in terms of functional status and quality of life over the past several weeks. Now having to use a walker, recent fall and decreased appetite.   In ER, afebrile, remained altered, normotensive and normoxia but at times noted to have frequent PVCs and accelerated junctional rhythm.  CTH negative for ICH or acute process, age indeterminate infarct in the superior right cerebellum. Labs significant for AST/ ALT 1373/ 642, ammonia 286, t. Bili 2, sCr 3.63 (previously 2.27 in 06/2023), trop hs 505, lactic > 9, WBC 16.3, H/H 5.8/ 22, plt 141, INR 4, tylenol level < 10, FOB +, UA neg.   CT chest/ abd/ pelvis without definite acute abnormality, no signs of infection, trace pericardial effusion, small amount of ascites and subcutaneous soft tissue edema c/w anasarca with  aortic atherosclerosis.  Protonix, bicarb, lactulose enema, 2u of emergency released PRBC ordered.  GI consulted with recs to start NAC.  Cultures sent and started on broad spectrum antibiotics. Small dose of metoprolol given to reduce PVC burden but pt became symptomatic with worsening junctional rhythm.  EDP consulted with cardiology, felt likely related to underlying metabolic derangements and to hold further anti-arrhythmics for now.   Pt transferred to [facility] for further evaluation and treatment, PCCM accepting.   Pertinent  Medical History   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   Degeneration of lumbar or lumbosacral intervertebral disc    Deviated nasal septum    Diabetes mellitus without complication (HCC)    Gastroesophageal reflux disease without esophagitis    Generalized anxiety disorder    Gout with tophus    Hearing loss    Hyperlipidemia    Hypertension    Insomnia    Murmur    Aortic stenosis   Nasal turbinate hypertrophy    Nonischemic cardiomyopathy (HCC)    Osteoarthritis of wrist    PAC (premature atrial contraction)    PAF (paroxysmal atrial fibrillation) (HCC) 03/03/2022   Peptic ulcer    Peripheral neuropathy    PVC (premature ventricular contraction)    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 rate (!) 36, height 5\' 11"  (1.803 m), weight 81.6 kg, SpO2 99%.         Intake/Output Summary (Last 24 hours) at 10/13/2023 1236 Last data filed at 10/13/2023 1010 Gross per 24 hour  Intake --  Output 1100 ml  Net -1100 ml   Filed Weights   10/13/23 0858  Weight: 81.6 kg    Examination: General: Acutely ill appearing 82 year old male, in distress  HENT: mild scleral icterus, unable to assess EOEM, dry MM Lungs: CTAB anteriorly, tachypneic  Cardiovascular: distant heart sounds, irregular rhythm, regular rate  Abdomen: distended, non tender Extremities: trace pitting edema of BLEs Neuro: Responsive to loud voice only, unable to follow commands, lifts arms spontaneously    Resolved Hospital Problem list     Assessment & Plan:    SIRS Acute liver failure with hepatic encephalopathy d/t hyperammonemia, coagulopathy and severe anemia  AKI on CKD 4 with metabolic acidosis  PVC's, prolonged QT, elevated troponin  Pt meeting SIRS criteria with leukocytosis, tachypnea, and hypothermia without obvious source of infection on labs and imaging. Currently with multisystem organ failure. GOC discussion had with immediate (three children) and extended family who agree with comfort care at this point considering his quality of life and functional status had been significantly declining over the past several weeks and he is unlikely to recover from this even with aggressive intervention. Family states pt has previously voiced he would not want aggressive intervention in such a situation.  -DNR - comfort care - comfort care orders placed including dilaudid for pain given CKD - transfer to palliative care   Best Practice (right click and "Reselect all SmartList Selections" daily)   Diet/type: NPO DVT prophylaxis: none given CC   Pressure ulcer(s): pressure ulcer assessment deferred  GI prophylaxis: N/A - CC Lines: N/A Foley:  Yes, and it is still needed Code Status:  DNR - CC Last date of multidisciplinary goals of care discussion [12/3] - two daughters and  one son at bedside all agree to DNR/DNI and comfort care   Labs   CBC: Recent Labs  Lab 10/13/23 0800 10/13/23 0808  WBC 16.3*  --   NEUTROABS 14.3*  --   HGB 5.8* 7.5*  HCT 22.0* 22.0*  MCV 78.3*  --   PLT 141*  --     Basic Metabolic Panel: Recent Labs  Lab 10/13/23 0800 10/13/23 0808  NA 143 141  K 5.1 5.1  CL 108 113*  CO2 <7*  --   GLUCOSE 82 74  BUN 75* 82*  CREATININE 3.63* 3.80*  CALCIUM 8.1*  --   MG 2.2  --    GFR: Estimated Creatinine Clearance: 15.4 mL/min (A) (by C-G formula based on SCr of 3.8 mg/dL (H)). Recent Labs  Lab 10/13/23 0800 10/13/23 0833  WBC 16.3*  --   LATICACIDVEN  --  >9.0*    Liver Function Tests: Recent Labs  Lab 10/13/23 0800  AST 1,373*  ALT 642*  ALKPHOS 116  BILITOT 2.0*  PROT 6.9  ALBUMIN 3.0*   No results  for input(s): "LIPASE", "AMYLASE" in the last 168 hours. Recent Labs  Lab 10/13/23 0800  AMMONIA 286*    ABG    Component Value Date/Time   PHART 7.449 12/20/2021 1116   PCO2ART 36.3 12/20/2021 1116   PO2ART 95.4 12/20/2021 1116   HCO3 6.2 (L) 10/13/2023 1124   TCO2 8 (L) 10/13/2023 0808   ACIDBASEDEF 22.1 (H) 10/13/2023 1124   O2SAT 48.7 10/13/2023 1124     Coagulation Profile: Recent Labs  Lab 10/13/23 0800  INR 4.0*    Cardiac Enzymes: No results for input(s): "CKTOTAL", "CKMB", "CKMBINDEX", "TROPONINI" in the last 168 hours.  HbA1C: Hgb A1c MFr Bld  Date/Time Value Ref Range Status  02/19/2022 06:36 PM 6.2 (H) 4.8 - 5.6 % Final    Comment:    (NOTE) Pre diabetes:          5.7%-6.4%  Diabetes:              >6.4%  Glycemic control for   <7.0% adults with diabetes   01/30/2021 09:40 AM 7.0 (H) 4.8 - 5.6 % Final    Comment:    (NOTE) Pre diabetes:          5.7%-6.4%  Diabetes:              >6.4%  Glycemic control for   <7.0% adults with diabetes     CBG: Recent Labs  Lab 10/13/23 0800 10/13/23 0836 10/13/23 0923 10/13/23 1114  GLUCAP 79 93 113* 89    Review of  Systems:   As in HPI  Past Medical History:  He,  has a past medical history of Actinic keratosis, Arthritis, AV block, Bradycardia, Cerebral aneurysm (02/19/2022), Degeneration of lumbar or lumbosacral intervertebral disc, Deviated nasal septum, Diabetes mellitus without complication (HCC), Gastroesophageal reflux disease without esophagitis, Generalized anxiety disorder, Gout with tophus, Hearing loss, Hyperlipidemia, Hypertension, Insomnia, Murmur, Nasal turbinate hypertrophy, Nonischemic cardiomyopathy (HCC), Osteoarthritis of wrist, PAC (premature atrial contraction), PAF (paroxysmal atrial fibrillation) (HCC) (03/03/2022), Peptic ulcer, Peripheral neuropathy, PVC (premature ventricular contraction), S/P TAVR (transcatheter aortic valve replacement) (12/24/2021), Seasonal allergic rhinitis due to pollen, and Stroke (HCC).   Surgical History:   Past Surgical History:  Procedure Laterality Date   CARDIAC CATHETERIZATION     CATARACT EXTRACTION Bilateral 08/2022   COLONOSCOPY     ELBOW SURGERY     pinched nerve on both arms   INGUINAL HERNIA REPAIR Right 10/30/2018   Procedure: OPEN REPAIR OF INCARCERATED RIGHT INGUINAL HERNIA WITH MESH;  Surgeon: Gaynelle Adu, MD;  Location: [facility] OR;  Service: General;  Laterality: Right;   IR 3D INDEPENDENT WKST  06/30/2022   IR ANGIO INTRA EXTRACRAN SEL COM CAROTID INNOMINATE UNI R MOD SED  06/30/2022   IR ANGIO INTRA EXTRACRAN SEL INTERNAL CAROTID UNI L MOD SED  06/30/2022   IR ANGIO VERTEBRAL SEL VERTEBRAL UNI L MOD SED  06/30/2022   IR ANGIOGRAM FOLLOW UP STUDY  07/02/2022   IR CT HEAD LTD  06/30/2022   IR TRANSCATH/EMBOLIZ  06/30/2022   IR US GUIDE VASC ACCESS RIGHT  06/30/2022   IR US GUIDE VASC ACCESS RIGHT  06/30/2022   JOINT REPLACEMENT     LEFT HEART CATH AND CORONARY ANGIOGRAPHY N/A 01/15/2021   Procedure: LEFT HEART CATH AND CORONARY ANGIOGRAPHY;  Surgeon: Yates Decamp, MD;  Location: [facility] INVASIVE CV LAB;  Service: Cardiovascular;  Laterality:  N/A;   RADIOLOGY WITH ANESTHESIA N/A 06/30/2022   Procedure: Tawny Asal;  Surgeon: Baldemar Lenis, MD;  Location:  [facility] OR;  Service: Radiology;  Laterality: N/A;   TEE WITHOUT CARDIOVERSION N/A 12/24/2021   Procedure: TRANSESOPHAGEAL ECHOCARDIOGRAM (TEE);  Surgeon: Tonny Bollman, MD;  Location: [facility] OR;  Service: Open Heart Surgery;  Laterality: N/A;   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   Procedure: TOTAL KNEE ARTHROPLASTY;  Surgeon: Eugenia Mcalpine, MD;  Location: [facility] ORS;  Service: Orthopedics;  Laterality: Right;  with adductor canal   WRIST SURGERY     pinched nerve     Social History:   reports that he quit smoking about 28 years ago. His smoking use included cigarettes. He started smoking about 43 years ago. He has a 7.5 pack-year smoking history. He has never used smokeless tobacco. He reports that he does not drink alcohol and does not use drugs.   Family History:  His family history includes Heart attack in his mother.   Allergies Allergies  Allergen Reactions   Tape     Pulls skin off   Trazodone And Nefazodone Other (See Comments)    Sluggishness the next morning     Home Medications  Prior to Admission medications   Medication Sig Start Date End Date Taking? Authorizing Provider  acetaminophen (TYLENOL) 500 MG tablet Take 500-1,000 mg by mouth every 8 (eight) hours as needed for moderate pain.   Yes [provider]  apixaban (ELIQUIS) 2.5 MG TABS tablet TAKE 1 TABLET(2.5 MG) BY MOUTH TWICE DAILY Patient taking differently: Take 2.5 mg by mouth 2 (two) times daily. 09/22/23  Yes Tolia, Sunit, DO  atorvastatin (LIPITOR) 10 MG tablet Take 10 mg by mouth every evening. 02/25/22  Yes [provider]  bumetanide (BUMEX) 1 MG tablet TAKE 1 TABLET(1 MG) BY MOUTH EVERY MORNING Patient taking differently: Take 1 mg by mouth daily. 08/10/23  Yes Tolia, Sunit, DO  cloNIDine (CATAPRES) 0.3 MG tablet TAKE 1 TABLET(0.3 MG) BY MOUTH THREE TIMES  DAILY Patient taking differently: Take 0.3 mg by mouth 3 (three) times daily. 05/11/23  Yes Tolia, Sunit, DO  cyclobenzaprine (FLEXERIL) 10 MG tablet Take 1 tablet (10 mg total) by mouth at bedtime for 10 days. 10/12/23 10/22/23 Yes Anders Simmonds T, DO  dapagliflozin propanediol (FARXIGA) 10 MG TABS tablet Take 1 tablet (10 mg total) by mouth daily. 09/04/23  Yes Tolia, Sunit, DO  hydrALAZINE (APRESOLINE) 100 MG tablet Take 1 tablet (100 mg total) by mouth 3 (three) times daily. 08/07/23  Yes Tolia, Sunit, DO  isosorbide dinitrate (ISORDIL) 20 MG tablet TAKE 2 TABLET BY MOUTH THREE TIMES DAILY Patient taking differently: Take 20 mg by mouth 3 (three) times daily. Take 40 mg by mouth 3 times daily 09/07/23  Yes Tolia, Sunit, DO  lidocaine (LIDODERM) 5 % Place 1 patch onto the skin daily. Remove & Discard patch within 12 hours or as directed by MD 10/12/23  Yes Anders Simmonds T, DO  metFORMIN (GLUCOPHAGE) 1000 MG tablet Take 1 tablet (1,000 mg total) by mouth 2 (two) times daily with a meal. 01/17/21  Yes Yates Decamp, MD  methylPREDNISolone (MEDROL DOSEPAK) 4 MG TBPK tablet Take as instructed by dose packaging 10/12/23  Yes Anders Simmonds T, DO  metoprolol succinate (TOPROL-XL) 25 MG 24 hr tablet TAKE 1 TABLET(25 MG) BY MOUTH DAILY Patient taking differently: Take 25 mg by mouth daily. 05/11/23  Yes Tolia, Sunit, DO  Multiple Vitamin (MULTI VITAMIN MENS PO) Take 1 tablet by mouth daily.   Yes [provider]  pantoprazole (PROTONIX) 40 MG tablet Take 40 mg by mouth daily before  breakfast.   Yes [provider]  Psyllium (METAMUCIL PO) Take 1 capsule by mouth daily.   Yes [provider]  sacubitril-valsartan (ENTRESTO) 49-51 MG Take 1 tablet by mouth 2 (two) times daily. 09/04/23  Yes Tessa Lerner, DO     Erick Alley, DO Family Medicine, PGY-3 10/13/2023 4:13 PM

## 2023-10-13 NOTE — ED Notes (Signed)
Date and time results received: 10/13/23 1158  (use smartphrase ".now" to insert current time)  Test: VBG Ph 7.1 PCO2 31  Name of Provider Notified: Dr. Posey Rea  Orders Received? Or Actions Taken?:

## 2023-10-13 NOTE — ED Notes (Signed)
Repaged cardiology [phone]

## 2023-10-13 NOTE — ED Notes (Signed)
GI was paged [phone]

## 2023-10-13 NOTE — ED Notes (Signed)
Pt found to be incontinent of clay colored stool.  Pericare and foley care provided and linens changed.

## 2023-10-14 DIAGNOSIS — N179 Acute kidney failure, unspecified: Secondary | ICD-10-CM

## 2023-10-14 DIAGNOSIS — R4182 Altered mental status, unspecified: Secondary | ICD-10-CM

## 2023-10-14 LAB — BLOOD CULTURE ID PANEL (REFLEXED) - BCID2

## 2023-10-14 LAB — TYPE AND SCREEN
ABO/RH(D): O POS
Antibody Screen: NEGATIVE
Unit division: 0
Unit division: 0

## 2023-10-14 LAB — BPAM RBC
Blood Product Expiration Date: 202412172359
Blood Product Expiration Date: 202412242359
ISSUE DATE / TIME: 202412030835
ISSUE DATE / TIME: 202412030835
Unit Type and Rh: 9500
Unit Type and Rh: 9500

## 2023-10-14 NOTE — Progress Notes (Signed)
PCCM: Resident Note   NAMEShariq Shelton, MRN:  [id number], DOB:  07/27/1941, LOS: 1 ADMISSION DATE:  10/13/2023, CONSULTATION DATE:  12/3  REFERRING MD:  EDP, CHIEF COMPLAINT:  AMS   History of Present Illness:  Pt encephalopathic, therefore HPI obtained from EMR review.   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  Pt evaluated in ER 12/2 with back pain and fall.  Felt a something pull in his back pulling himself up treating with tylenol at home.  Suffered a fall 12/2, no LOC and denied hitting head. Family unsure what caused the fall.  Pt treated with toradol and discharged home well appearing with lidocaine patches and flexeril.  Family doubt tylenol overuse. This morning, pt found confused by his daughter, LKW 1630 yesterday.  Found to be hypoglycemic with EMS.  Family denies any recent sick symptoms. Has complained of constipation, last BM yesterday, no blood in stool. Daughter notes he has had new BLE pitting edema for the past 5 days. Per family, pt has been declining in terms of functional status and quality of life over the past several weeks. Now having to use a walker, recent fall and decreased appetite.    In ER, afebrile, remained altered, normotensive and normoxia but at times noted to have frequent PVCs and accelerated junctional rhythm.  CTH negative for ICH or acute process, age indeterminate infarct in the superior right cerebellum. Labs significant for AST/ ALT 1373/ 642, ammonia 286, t. Bili 2, sCr 3.63 (previously 2.27 in 06/2023), trop hs 505, lactic > 9, WBC 16.3, H/H 5.8/ 22, plt 141, INR 4, tylenol level < 10, FOB +, UA neg.   CT chest/ abd/ pelvis without definite acute abnormality, no signs of infection, trace pericardial effusion, small amount of ascites and subcutaneous soft tissue edema c/w  anasarca with aortic atherosclerosis.  Protonix, bicarb, lactulose enema, 2u of emergency released PRBC ordered.  GI consulted with recs to start NAC.  Cultures sent and started on broad spectrum antibiotics. Small dose of metoprolol given to reduce PVC burden but pt became symptomatic with worsening junctional rhythm.  EDP consulted with cardiology, felt likely related to underlying metabolic derangements and to hold further anti-arrhythmics for now.   Pt transferred to [facility] for further evaluation and treatment, PCCM accepting.   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 rate (!) 7, height 5\' 11"  (1.803 m), weight 90.2 kg, SpO2 94%.        Intake/Output Summary (Last 24 hours) at 10/14/2023 0813 Last data filed at 10/14/2023 0700 Gross per 24 hour  Intake 50.57 ml  Output 1100 ml  Net -1049.43 ml   Filed Weights   10/13/23 0858 10/13/23 1418  Weight: 81.6 kg 90.2 kg    Examination: General: 82 year old male, sleeping, NAD Lungs: breathing comfortably on RA Cardiovascular:  perfused  GU: Foley   Labs   N/A  Resolved Hospital Problem list     Assessment & Plan:  SIRS Acute liver failure with hepatic encephalopathy d/t hyperammonemia, coagulopathy and severe anemia  AKI on CKD 4 with metabolic acidosis  PVC's, prolonged QT, elevated troponin  Pt meeting SIRS criteria with leukocytosis, tachypnea, and hypothermia without obvious source of infection on labs and imaging. Currently with multisystem organ  failure. GOC discussion had with immediate (three children) and extended family who agree with comfort care at this point considering his quality of life and functional status had been significantly declining over the past several weeks and he is unlikely to recover from this even with aggressive intervention. Family states pt has previously voiced he would not want aggressive intervention in such a situation.  - comfort care orders placed 12/3  - transfer to palliative care - will transfer care to the floor today as pt was stable through the night  Best Practice (right click and "Reselect all SmartList Selections" daily)   Diet/type: NPO DVT prophylaxis:N/A Pressure ulcer(s). N/A GI prophylaxis: N/A Lines: None Foley:  yes and needed Code Status:  DNR CC Last date of multidisciplinary goals of care discussion [12/3]    Erick Alley, DO Family Medicine, PGY-3 10/14/2023 10:49 AM

## 2023-10-14 NOTE — Progress Notes (Signed)
RN has received patient and family. Patient is resting comfortably at 4mg  of dilaudid gtts. Family encouraged to reach out to RN with any questions, needs, or concerns. No vitals done, RN will ensure comfort measures only.

## 2023-10-14 NOTE — Progress Notes (Signed)
PHARMACY - PHYSICIAN COMMUNICATION CRITICAL VALUE ALERT - BLOOD CULTURE IDENTIFICATION (BCID)  Nicholas Shelton is an 82 y.o. male who presented to [facility] on 10/13/2023 with a chief complaint of altered mental status   Name of physician (or Provider) Contacted: Dr. Delia Chimes  Current antibiotics: None, transitioned to comfort measures yesterday  Changes to prescribed antibiotics recommended:  No changes per CCM, cont comfort measures  Results for orders placed or performed during the hospital encounter of 10/13/23  Blood Culture ID Panel (Reflexed) (Collected: 10/13/2023 11:03 AM)  Result Value Ref Range   Enterococcus faecalis NOT DETECTED NOT DETECTED   Enterococcus Faecium NOT DETECTED NOT DETECTED   Listeria monocytogenes NOT DETECTED NOT DETECTED   Staphylococcus species NOT DETECTED NOT DETECTED   Staphylococcus aureus (BCID) NOT DETECTED NOT DETECTED   Staphylococcus epidermidis NOT DETECTED NOT DETECTED   Staphylococcus lugdunensis NOT DETECTED NOT DETECTED   Streptococcus species DETECTED (A) NOT DETECTED   Streptococcus agalactiae NOT DETECTED NOT DETECTED   Streptococcus pneumoniae NOT DETECTED NOT DETECTED   Streptococcus pyogenes NOT DETECTED NOT DETECTED   A.calcoaceticus-baumannii NOT DETECTED NOT DETECTED   Bacteroides fragilis NOT DETECTED NOT DETECTED   Enterobacterales NOT DETECTED NOT DETECTED   Enterobacter cloacae complex NOT DETECTED NOT DETECTED   Escherichia coli NOT DETECTED NOT DETECTED   Klebsiella aerogenes NOT DETECTED NOT DETECTED   Klebsiella oxytoca NOT DETECTED NOT DETECTED   Klebsiella pneumoniae NOT DETECTED NOT DETECTED   Proteus species NOT DETECTED NOT DETECTED   Salmonella species NOT DETECTED NOT DETECTED   Serratia marcescens NOT DETECTED NOT DETECTED   Haemophilus influenzae NOT DETECTED NOT DETECTED   Neisseria meningitidis NOT DETECTED NOT DETECTED   Pseudomonas aeruginosa NOT DETECTED NOT DETECTED   Stenotrophomonas maltophilia NOT  DETECTED NOT DETECTED   Candida albicans NOT DETECTED NOT DETECTED   Candida auris NOT DETECTED NOT DETECTED   Candida glabrata NOT DETECTED NOT DETECTED   Candida krusei NOT DETECTED NOT DETECTED   Candida parapsilosis NOT DETECTED NOT DETECTED   Candida tropicalis NOT DETECTED NOT DETECTED   Cryptococcus neoformans/gattii NOT DETECTED NOT DETECTED    Abran Duke 10/14/2023  5:44 AM

## 2023-10-16 LAB — CULTURE, BLOOD (ROUTINE X 2)

## 2023-11-11 NOTE — Discharge Summary (Signed)
[facility] DEATH SUMMARY   Patient Details  Name: Nicholas Shelton MRN: [id number] DOB: June 05, 1941  Admission/Discharge Information   Admit Date:  Oct 15, 2023  Date of Death: Date of Death: 2023/10/17  Time of Death: Time of Death: 0525  Length of Stay: 2  Referring Physician: Barbie Banner, MD   Reason(s) for Hospitalization  Encephalopathy  Diagnoses  Preliminary cause of death: Severe sepsis Secondary Diagnoses (including complications and co-morbidities):  Principal Problem:   Liver failure (HCC) Active Problems:   AMS (altered mental status) Acute kidney injury CKD stage IV HFpEF Paroxysmal atrial fibrillation Aortic stenosis status post TAVR Type 2 diabetes Hypertension Dyslipidemia Left P-comm aneurysm status post embolization  Brief Hospital Course (including significant findings, care, treatment, and services provided and events leading to death)  Nicholas Shelton is a 83 y.o. year old male who had been in declining functional health.  Suffered a fall on 12/2.  Found confused and hypoglycemic.  Brought to hospital normotensive but hypothermic and confused.  CT head showed no acute processes.  Marked elevation in transaminases and bilirubin with elevated lactate and INR.  Negative troponin level.  No signs of infection on CT scan.  Brought to ICU.  Patient's condition was discussed with the family: Given progressive functional decline with severe liver failure chances of meaningful recovery to good functional status are dismal.  Patient's family are requesting that he be kept comfortable he is was transition to comfort care and passed away peacefully.   Pertinent Labs and Studies  Significant Diagnostic Studies CT CHEST ABDOMEN PELVIS WO CONTRAST  Result Date: 10/14/2023 CLINICAL DATA:  Sepsis. EXAM: CT CHEST, ABDOMEN AND PELVIS WITHOUT CONTRAST TECHNIQUE: Multidetector CT imaging of the chest, abdomen and pelvis was performed following the standard protocol without IV contrast. RADIATION  DOSE REDUCTION: This exam was performed according to the departmental dose-optimization program which includes automated exposure control, adjustment of the mA and/or kV according to patient size and/or use of iterative reconstruction technique. COMPARISON:  12/05/2021. FINDINGS: STUDY DEGRADED BY PATIENT MOTION. CT CHEST FINDINGS Cardiovascular: Heart top-normal in size. There changes from aortic valve replacement that are new since the prior exam. Left and right coronary artery calcifications. Minimal pericardial fluid. Great vessels are normal in caliber. Stable aortic atherosclerosis. Mediastinum/Nodes: No neck base, mediastinal or hilar masses. No enlarged lymph nodes. Trachea and esophagus are unremarkable. Lungs/Pleura: Small pleural effusions. Minor atelectasis in the dependent lower lobes adjacent to the pleural effusions. Mild interstitial thickening, decreased when compared to the prior chest CT, suspected to be chronic. No lung consolidation or overt edema. No pneumothorax. Musculoskeletal: No fracture or acute finding. No bone lesion. No chest wall mass. CT ABDOMEN PELVIS FINDINGS Hepatobiliary: Normal liver. Gallbladder mostly decompressed, grossly unremarkable. No bile duct dilation. Pancreas: Atrophy and partial fatty replacement. No pancreatic mass or inflammation. Spleen: Normal in size without focal abnormality. Adrenals/Urinary Tract: No adrenal mass. Bilateral renal cortical thinning. Mild decrease in the overall length of the right kidney. Two left exophytic cortical cysts, largest posterior upper pole, 7.9 cm. Second is from the midpole, 2.8 cm. Similar appearing exophytic cyst from the lower pole of the right kidney, 2.9 cm, another from the lateral midpole, 2.1 cm. Cysts are similar to the prior CT. No follow-up recommended. No other renal masses, no stones and no hydronephrosis. Normal ureters. Bladder decompressed with a Foley catheter Stomach/Bowel: Stomach moderately distended, otherwise  unremarkable. Small bowel and colon are normal in caliber. No wall thickening or convincing inflammation. Vascular/Lymphatic: Dense aortic atherosclerotic calcifications.  No aneurysm. No enlarged lymph nodes. Reproductive: Unremarkable. Other: Small amount of ascites most evident adjacent to the liver and spleen. Subcutaneous soft tissue edema. Musculoskeletal: No fracture or acute finding.  No bone lesion. IMPRESSION: 1. No definite acute abnormality within the chest, abdomen or pelvis. No signs of infection. 2. Trace pericardial effusion, small pleural effusions, small amount of ascites and subcutaneous soft tissue edema consistent with a diffuse edematous state/anasarca. 3. Aortic atherosclerosis. Electronically Signed   By: Amie Portland M.D.   On: 10/13/2023 11:43   CT Head Wo Contrast  Result Date: 10/13/2023 CLINICAL DATA:  Delirium EXAM: CT HEAD WITHOUT CONTRAST TECHNIQUE: Contiguous axial images were obtained from the base of the skull through the vertex without intravenous contrast. RADIATION DOSE REDUCTION: This exam was performed according to the departmental dose-optimization program which includes automated exposure control, adjustment of the mA and/or kV according to patient size and/or use of iterative reconstruction technique. COMPARISON:  Brain MR 02/19/2022 FINDINGS: Brain: No hemorrhage. No hydrocephalus. No extra-axial fluid collection. 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 No mass effect. No mass lesion. Mineralization of the basal ganglia bilaterally. Vascular: No hyperdense vessel or unexpected calcification. Skull: Normal. Negative for fracture or focal lesion. Sinuses/Orbits: No middle ear or mastoid effusion. Paranasal sinuses are clear. Bilateral lens replacement. Orbits are otherwise unremarkable. Other: None. IMPRESSION:  1. No CT evidence of intracranial injury. 2. 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 Electronically Signed   By: Lorenza Cambridge M.D.   On: 10/13/2023 08:35    Microbiology Recent Results (from the past 240 hour(s))  Blood culture (routine x 2)     Status: Abnormal (Preliminary result)   Collection Time: 10/13/23 11:03 AM   Specimen: BLOOD  Result Value Ref Range Status   Specimen Description   Final    BLOOD BLOOD RIGHT HAND Performed at [facility], [address], [place], [place] [address]    Special Requests   Final    BOTTLES DRAWN AEROBIC AND ANAEROBIC Blood Culture results may not be optimal due to an inadequate volume of blood received in culture bottles Performed at [facility], [address], [place], [place] [address]    Culture  Setup Time   Final    GRAM POSITIVE COCCI IN BOTH AEROBIC AND ANAEROBIC BOTTLES CRITICAL RESULT CALLED TO, READ BACK BY AND VERIFIED WITH: JESSICA EGNA @ [facility] 0224 [id number], VIRAY,J CRITICAL RESULT CALLED TO, READ BACK BY AND VERIFIED WITH: PHARMD J LEDFORD 10/14/2023 @ 0530 BY AB    Culture (A)  Final    STREPTOCOCCUS GALLOLYTICUS SUSCEPTIBILITIES TO FOLLOW Performed at [facility], [address] [place], [place] [address]    Report Status PENDING  Incomplete  Blood culture (routine x 2)     Status: Abnormal (Preliminary result)   Collection Time: 10/13/23 11:03 AM   Specimen: BLOOD  Result Value Ref Range Status   Specimen Description   Final    BLOOD BLOOD LEFT HAND Performed at [facility], [address], [place], [place] [address]    Special Requests   Final    BOTTLES DRAWN AEROBIC ONLY Blood Culture results may not be optimal due to an inadequate volume of blood received in culture bottles Performed at [facility], [address] [place], [place] [address]    Culture  Setup Time  Final    GRAM POSITIVE COCCI AEROBIC BOTTLE ONLY CRITICAL RESULT CALLED TO, READ BACK BY AND VERIFIED WITH: JESSICA EGNA @  [facility] 0224 [phone], VIRAY,J Performed at [facility], [address] [place], [place] [address]    Culture STREPTOCOCCUS GALLOLYTICUS (A)  Final   Report Status PENDING  Incomplete  Blood Culture ID Panel (Reflexed)     Status: Abnormal   Collection Time: 10/13/23 11:03 AM  Result Value Ref Range Status   Enterococcus faecalis NOT DETECTED NOT DETECTED Final   Enterococcus Faecium NOT DETECTED NOT DETECTED Final   Listeria monocytogenes NOT DETECTED NOT DETECTED Final   Staphylococcus species NOT DETECTED NOT DETECTED Final   Staphylococcus aureus (BCID) NOT DETECTED NOT DETECTED Final   Staphylococcus epidermidis NOT DETECTED NOT DETECTED Final   Staphylococcus lugdunensis NOT DETECTED NOT DETECTED Final   Streptococcus species DETECTED (A) NOT DETECTED Final    Comment: Not Enterococcus species, Streptococcus agalactiae, Streptococcus pyogenes, or Streptococcus pneumoniae. CRITICAL RESULT CALLED TO, READ BACK BY AND VERIFIED WITH: PHARMD J LEDFORD 10/14/2023 @ 0530 BY AB    Streptococcus agalactiae NOT DETECTED NOT DETECTED Final   Streptococcus pneumoniae NOT DETECTED NOT DETECTED Final   Streptococcus pyogenes NOT DETECTED NOT DETECTED Final   A.calcoaceticus-baumannii NOT DETECTED NOT DETECTED Final   Bacteroides fragilis NOT DETECTED NOT DETECTED Final   Enterobacterales NOT DETECTED NOT DETECTED Final   Enterobacter cloacae complex NOT DETECTED NOT DETECTED Final   Escherichia coli NOT DETECTED NOT DETECTED Final   Klebsiella aerogenes NOT DETECTED NOT DETECTED Final   Klebsiella oxytoca NOT DETECTED NOT DETECTED Final   Klebsiella pneumoniae NOT DETECTED NOT DETECTED Final   Proteus species NOT DETECTED NOT DETECTED Final   Salmonella species NOT DETECTED NOT DETECTED Final   Serratia marcescens NOT DETECTED NOT DETECTED Final   Haemophilus influenzae NOT DETECTED NOT DETECTED Final   Neisseria meningitidis NOT DETECTED NOT DETECTED Final   Pseudomonas aeruginosa NOT DETECTED  NOT DETECTED Final   Stenotrophomonas maltophilia NOT DETECTED NOT DETECTED Final   Candida albicans NOT DETECTED NOT DETECTED Final   Candida auris NOT DETECTED NOT DETECTED Final   Candida glabrata NOT DETECTED NOT DETECTED Final   Candida krusei NOT DETECTED NOT DETECTED Final   Candida parapsilosis NOT DETECTED NOT DETECTED Final   Candida tropicalis NOT DETECTED NOT DETECTED Final   Cryptococcus neoformans/gattii NOT DETECTED NOT DETECTED Final    Comment: Performed at [facility], [address], [place], [place] [address]  MRSA Next Gen by PCR, Nasal     Status: None   Collection Time: 10/13/23  1:43 PM   Specimen: Urine, Catheterized; Nasal Swab  Result Value Ref Range Status   MRSA by PCR Next Gen NOT DETECTED NOT DETECTED Final    Comment: (NOTE) The GeneXpert MRSA Assay (FDA approved for NASAL specimens only), is one component of a comprehensive MRSA colonization surveillance program. It is not intended to diagnose MRSA infection nor to guide or monitor treatment for MRSA infections. Test performance is not FDA approved in patients less than 52 years old. Performed at [facility], [address] [place], [place] [address]     Lab Basic Metabolic Panel: Recent Labs  Lab 10/13/23 0800 10/13/23 0808 10/13/23 1444  NA 143 141  --   K 5.1 5.1  --   CL 108 113*  --   CO2 <7*  --   --   GLUCOSE 82 74 109*  BUN 75* 82*  --  CREATININE 3.63* 3.80*  --   CALCIUM 8.1*  --   --   MG 2.2  --   --    Liver Function Tests: Recent Labs  Lab 10/13/23 0800  AST 1,373*  ALT 642*  ALKPHOS 116  BILITOT 2.0*  PROT 6.9  ALBUMIN 3.0*   No results for input(s): "LIPASE", "AMYLASE" in the last 168 hours. Recent Labs  Lab 10/13/23 0800  AMMONIA 286*   CBC: Recent Labs  Lab 10/13/23 0800 10/13/23 0808 10/13/23 1444  WBC 16.3*  --  17.1*  NEUTROABS 14.3*  --   --   HGB 5.8* 7.5* 7.4*  HCT 22.0* 22.0* 26.3*  MCV 78.3*  --  81.2  PLT 141*  --  91*    Cardiac Enzymes: No results for input(s): "CKTOTAL", "CKMB", "CKMBINDEX", "TROPONINI" in the last 168 hours. Sepsis Labs: Recent Labs  Lab 10/13/23 0800 10/13/23 0833 10/13/23 1103 10/13/23 1444  WBC 16.3*  --   --  17.1*  LATICACIDVEN  --  >9.0* >9.0*  --     Procedures/Operations   none   Garald Rhew 10/24/2023, 6:20 PM

## 2023-11-11 NOTE — Plan of Care (Signed)
  Problem: Pain Management: Goal: General experience of comfort will improve Outcome: Progressing

## 2023-11-11 NOTE — Progress Notes (Signed)
Called to room by patient's family, stating " I think patient has passed". This RN and primary Nurse, Cloyd Stagers, found patient unresponsive in bed at 05:25 am ish;  not breathing, no pulse, no heart or breath sound ausculated. Patient was DNR/[place] care. Patient not met ME case criteria. Death pronounced @05 :25 am by this RN and primary RN. On call critical care Provider notified, [facility] was called, PIV and foley removed, postmortem care provided. Belonging check off and given to patient's daughter Delorise Shiner. Postmortem check list started, patient placement notified, family member in room  with patient, hand off report given to oncoming RN .

## 2023-11-11 DEATH — deceased

## 2023-11-27 ENCOUNTER — Ambulatory Visit: Payer: Self-pay | Admitting: Cardiology

## 2023-11-30 ENCOUNTER — Ambulatory Visit: Payer: Medicare Other | Admitting: Cardiology
